# Patient Record
Sex: Male | Born: 1990 | Race: Black or African American | Hispanic: No | Marital: Single | Smoking: Current every day smoker
Health system: Southern US, Community
[De-identification: ages and names within clinical notes are randomized; demographics above are authoritative.]

## PROBLEM LIST (undated history)

## (undated) DIAGNOSIS — F209 Schizophrenia, unspecified: Secondary | ICD-10-CM

## (undated) DIAGNOSIS — F259 Schizoaffective disorder, unspecified: Secondary | ICD-10-CM

## (undated) DIAGNOSIS — R451 Restlessness and agitation: Secondary | ICD-10-CM

---

## 2012-06-03 DIAGNOSIS — F909 Attention-deficit hyperactivity disorder, unspecified type: Secondary | ICD-10-CM | POA: Insufficient documentation

## 2013-06-07 ENCOUNTER — Emergency Department: Payer: Self-pay | Admitting: Emergency Medicine

## 2013-06-15 LAB — URINALYSIS, COMPLETE
Bacteria: NONE SEEN
Bilirubin,UR: NEGATIVE
Blood: NEGATIVE
Glucose,UR: NEGATIVE mg/dL (ref 0–75)
Nitrite: NEGATIVE
PH: 5 (ref 4.5–8.0)
Protein: NEGATIVE
RBC,UR: 7 /HPF (ref 0–5)
SQUAMOUS EPITHELIAL: NONE SEEN
Specific Gravity: 1.024 (ref 1.003–1.030)
WBC UR: 5 /HPF (ref 0–5)

## 2013-06-15 LAB — CBC
HCT: 45 % (ref 40.0–52.0)
HGB: 14.9 g/dL (ref 13.0–18.0)
MCH: 29.9 pg (ref 26.0–34.0)
MCHC: 33.2 g/dL (ref 32.0–36.0)
MCV: 90 fL (ref 80–100)
PLATELETS: 241 10*3/uL (ref 150–440)
RBC: 4.99 10*6/uL (ref 4.40–5.90)
RDW: 13.6 % (ref 11.5–14.5)
WBC: 7.1 10*3/uL (ref 3.8–10.6)

## 2013-06-15 LAB — COMPREHENSIVE METABOLIC PANEL
ALBUMIN: 4.2 g/dL (ref 3.4–5.0)
ALK PHOS: 63 U/L
ANION GAP: 5 — AB (ref 7–16)
BILIRUBIN TOTAL: 0.4 mg/dL (ref 0.2–1.0)
BUN: 12 mg/dL (ref 7–18)
CALCIUM: 9.4 mg/dL (ref 8.5–10.1)
CO2: 28 mmol/L (ref 21–32)
Chloride: 103 mmol/L (ref 98–107)
Creatinine: 0.71 mg/dL (ref 0.60–1.30)
EGFR (African American): >60
EGFR (Non-African Amer.): >60
Glucose: 78 mg/dL (ref 65–99)
OSMOLALITY: 271 (ref 275–301)
POTASSIUM: 3.6 mmol/L (ref 3.5–5.1)
SGOT(AST): 20 U/L (ref 15–37)
SGPT (ALT): 20 U/L (ref 12–78)
Sodium: 136 mmol/L (ref 136–145)
Total Protein: 7.9 g/dL (ref 6.4–8.2)

## 2013-06-15 LAB — DRUG SCREEN, URINE
AMPHETAMINES, UR SCREEN: NEGATIVE (ref ?–1000)
Barbiturates, Ur Screen: NEGATIVE (ref ?–200)
Benzodiazepine, Ur Scrn: NEGATIVE (ref ?–200)
COCAINE METABOLITE, UR ~~LOC~~: NEGATIVE (ref ?–300)
Cannabinoid 50 Ng, Ur ~~LOC~~: POSITIVE (ref ?–50)
MDMA (Ecstasy)Ur Screen: NEGATIVE (ref ?–500)
Methadone, Ur Screen: NEGATIVE (ref ?–300)
Opiate, Ur Screen: NEGATIVE (ref ?–300)
Phencyclidine (PCP) Ur S: NEGATIVE (ref ?–25)
TRICYCLIC, UR SCREEN: NEGATIVE (ref ?–1000)

## 2013-06-15 LAB — ACETAMINOPHEN LEVEL

## 2013-06-15 LAB — SALICYLATE LEVEL: SALICYLATES, SERUM: 2.1 mg/dL

## 2013-06-15 LAB — ETHANOL
Ethanol %: <0.003 % (ref 0.000–0.080)
Ethanol: <3 mg/dL

## 2013-06-16 ENCOUNTER — Inpatient Hospital Stay: Payer: Self-pay | Admitting: Psychiatry

## 2013-06-20 LAB — VALPROIC ACID LEVEL: Valproic Acid: 66 ug/mL

## 2013-07-03 ENCOUNTER — Emergency Department: Payer: Self-pay | Admitting: Emergency Medicine

## 2013-07-03 LAB — COMPREHENSIVE METABOLIC PANEL
ALBUMIN: 3.8 g/dL (ref 3.4–5.0)
Alkaline Phosphatase: 73 U/L
Anion Gap: 5 — ABNORMAL LOW (ref 7–16)
BUN: 9 mg/dL (ref 7–18)
Bilirubin,Total: 0.2 mg/dL (ref 0.2–1.0)
CHLORIDE: 107 mmol/L (ref 98–107)
CO2: 27 mmol/L (ref 21–32)
Calcium, Total: 8.8 mg/dL (ref 8.5–10.1)
Creatinine: 0.8 mg/dL (ref 0.60–1.30)
EGFR (African American): >60
GLUCOSE: 84 mg/dL (ref 65–99)
OSMOLALITY: 275 (ref 275–301)
POTASSIUM: 3.5 mmol/L (ref 3.5–5.1)
SGOT(AST): 21 U/L (ref 15–37)
SGPT (ALT): 22 U/L (ref 12–78)
SODIUM: 139 mmol/L (ref 136–145)
Total Protein: 7.3 g/dL (ref 6.4–8.2)

## 2013-07-03 LAB — URINALYSIS, COMPLETE
BILIRUBIN, UR: NEGATIVE
BLOOD: NEGATIVE
GLUCOSE, UR: NEGATIVE mg/dL (ref 0–75)
Leukocyte Esterase: NEGATIVE
NITRITE: NEGATIVE
PROTEIN: NEGATIVE
Ph: 5 (ref 4.5–8.0)
RBC,UR: <1 /HPF (ref 0–5)
Specific Gravity: 1.026 (ref 1.003–1.030)
Squamous Epithelial: <1

## 2013-07-03 LAB — ACETAMINOPHEN LEVEL

## 2013-07-03 LAB — CBC
HCT: 40.3 % (ref 40.0–52.0)
HGB: 13.4 g/dL (ref 13.0–18.0)
MCH: 30.1 pg (ref 26.0–34.0)
MCHC: 33.3 g/dL (ref 32.0–36.0)
MCV: 90 fL (ref 80–100)
Platelet: 192 10*3/uL (ref 150–440)
RBC: 4.46 10*6/uL (ref 4.40–5.90)
RDW: 14.6 % — ABNORMAL HIGH (ref 11.5–14.5)
WBC: 7.2 10*3/uL (ref 3.8–10.6)

## 2013-07-03 LAB — ETHANOL: Ethanol %: <0.003 % (ref 0.000–0.080)

## 2013-07-03 LAB — SALICYLATE LEVEL

## 2013-07-04 LAB — DRUG SCREEN, URINE

## 2013-07-04 LAB — VALPROIC ACID LEVEL: VALPROIC ACID: 33 ug/mL — AB
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
## 2014-05-05 NOTE — Consult Note (Signed)
PATIENT NAME:  [name], [name] MR#:  [id number] DATE OF BIRTH:  08/17/1990  DATE OF CONSULTATION:  07/04/2013  REFERRING PHYSICIAN:  Janalyn Harderavid Kaminski, MD  CONSULTING PHYSICIAN:  Ardeen FillersUzma [name]. Greg BuddyFaheem, MD  REASON FOR CONSULTATION: "I need my medication. I had a panic attack earlier. I'm easily startled. I have pressure from my family."   HISTORY OF PRESENT ILLNESS: The patient is a 24 year old male with history of schizoaffective disorder who was recently discharged from the inpatient unit, presented again to the ED with depressive feelings. He reported that he has been having depressive feelings and feeling hyper-religious for the past few days. His mother reported initially that the patient has expressed feelings of worthlessness and appeared to be having racing thoughts. He reported that he also has history of suicidal attempts in the past and mother was concerned for his safety. The patient reported that he has been feeling anxious and has been having auditory hallucinations, especially of the God'[name] voices. However, he reported that he has been reading extensively and has lots of books. The patient reported that after his discharge from the hospital, he was unable to keep his follow-up appointment and started having panic attacks. He reported that for the past 6 days, he was feeling depressed about things going on in his life. However, he was researching a lot and was reading a lot of books. He stated that he was unable [facility] because he lost their number. He stated that he is currently compliant with his medication and is supposed to get [place] shot on the 7th, but he does not have the number for the [facility]. He recurrently denied having any suicidal ideations or plans. He denied having any perceptual disturbances. He reports that he does not use any drugs or alcohol at this time.   PAST PSYCHIATRIC HISTORY: The patient has a history of multiple psychiatric hospitalizations. He has been  tried on multiple psychotropic medications in the past and is currently stable on his medications. He takes [place]vega Sustenna on a regular basis. He is supposed to follow up with his [facility], but reported that he lost the number for them. The patient reported multiple suicide attempts by running into the traffic, jumping off the bridge and overdoses. He started off his mental illness at the age of 89.   FAMILY PSYCHIATRIC HISTORY: Unknown as the patient was adopted.   PAST MEDICAL HISTORY: History of slipped disc in the lower back.   ALLERGIES: GEODON.   CURRENT MEDICATIONS: Zyprexa 10 mg b.i.d., temazepam 15 mg at bedtime, Depakote 500 mg 3 times daily, Invega Sustenna 234 mg every month.   SOCIAL HISTORY: The patient is currently adopted. His adoptive mother lives in [place]. His biological mother also lives there locally. He is currently living with his family. He stated that he has Disability.   REVIEW OF SYSTEMS: CONSTITUTIONAL: Denies any fever or chills. No weight changes.  EYES: No double or blurred vision.  ENT: No hearing loss.  RESPIRATORY: No shortness of breath or cough.  CARDIOVASCULAR: Denies any chest pain or orthopnea.  GASTROINTESTINAL: No abdominal pain, nausea, vomiting or diarrhea.  GENITOURINARY: No incontinence or frequency.  ENDOCRINE: No heat or cold intolerance.  LYMPHATIC: No anemia or easy bruising.  INTEGUMENTARY: No acne or rash.  MUSCULOSKELETAL: No muscle or joint pain.  NEUROLOGIC: No tingling or weakness.   VITAL SIGNS: Temperature 98, pulse 92, respirations 18, blood pressure 91/42.  LABORATORY DATA: Glucose 84, BUN 9, creatinine 0.8,  sodium 139, potassium 3.5, chloride 107, bicarbonate 27, anion gap 5, osmolality 275, calcium 8.8. Blood alcohol level less than 3. Protein 7.3, albumin 3.8, bilirubin 0.2, alkaline phosphatase 73, AST 21, ALT 22. UDS is negative. WBC 7.2, RBC 4.46, hemoglobin 13.4, hematocrit 40.3, MCV is 90, RDW is 14.6.   MENTAL STATUS  EXAMINATION: The patient is a moderately built male who appeared his stated age. His muscle tone appears normal. Gait and station appear within normal limits. Speech was low in tone and volume. Thought process logical, goal directed. Thought content was not delusional. No looseness of associations are noted. Insight and judgment regarding his psychiatric illness are fair. Awake, alert and oriented x 3. Recent and remove memory were intact. Attention span and concentration were normal. His fund of knowledge and language appears normal. His insight and judgment are within normal limits. Mood was fine. Affect was congruent.   DIAGNOSTIC IMPRESSION:  AXIS I:  1. Schizoaffective disorder bipolar type.  2. Cannabis abuse.  AXIS II: None.  AXIS III: History of slipped disc.   TREATMENT PLAN:  1. The patient contracted for safety and reported that he does not have any thoughts to hurt himself.  2. He will be discharged from the ER at this time and he will follow up with [facility]. He has enough supply of his medications and no medications will be given to him at this time. The patient will be given information about the [facility]. I advised the patient to come back if he notices worsening of his symptoms and he demonstrated understanding.   Thank you for allowing me to participate in the care of this patient.     ____________________________ Ardeen Fillers. Greg Buddy, MD usf:es D: 07/04/2013 14:01:36 ET T: 07/04/2013 15:47:01 ET JOB#: [id number]  cc: Ardeen Fillers. Greg Buddy, MD, <Dictator> Rhunette Croft MD ELECTRONICALLY SIGNED 07/06/2013 11:08

## 2014-05-05 NOTE — Consult Note (Signed)
Brief Consult Note: Diagnosis: Schizoaffective disorder bipolar type.   Patient was seen by consultant.   Consult note dictated.   Recommend further assessment or treatment.   Orders entered.   Comments: Will admitt to psychiatry.  Electronic Signatures: Kristine LineaPucilowska, Latiqua Daloia (MD)  (Signed 05-Jun-15 12:44)  Authored: Brief Consult Note   Last Updated: 05-Jun-15 12:44 by Kristine LineaPucilowska, Maddux Vanscyoc (MD)

## 2014-05-11 ENCOUNTER — Emergency Department
Admit: 2014-05-11 | Disposition: A | Discharge status: Other Acute Inpatient Hospital | Payer: Self-pay | Admitting: Emergency Medicine

## 2014-05-11 LAB — DRUG SCREEN, URINE
Amphetamines, Ur Screen: NEGATIVE
BARBITURATES, UR SCREEN: NEGATIVE
Benzodiazepine, Ur Scrn: POSITIVE
CANNABINOID 50 NG, UR ~~LOC~~: NEGATIVE
COCAINE METABOLITE, UR ~~LOC~~: NEGATIVE
MDMA (Ecstasy)Ur Screen: NEGATIVE
METHADONE, UR SCREEN: NEGATIVE
Opiate, Ur Screen: POSITIVE
PHENCYCLIDINE (PCP) UR S: NEGATIVE
TRICYCLIC, UR SCREEN: NEGATIVE

## 2014-05-11 LAB — COMPREHENSIVE METABOLIC PANEL
ALK PHOS: 62 U/L
AST: 22 U/L
Albumin: 4.3 g/dL
Anion Gap: 5 — ABNORMAL LOW (ref 7–16)
BUN: 11 mg/dL
Bilirubin,Total: 0.5 mg/dL
CALCIUM: 9.2 mg/dL
Chloride: 105 mmol/L
Co2: 31 mmol/L
Creatinine: 0.87 mg/dL
EGFR (African American): >60
EGFR (Non-African Amer.): >60
Glucose: 77 mg/dL
Potassium: 3.9 mmol/L
SGPT (ALT): 19 U/L
SODIUM: 141 mmol/L
Total Protein: 7.4 g/dL

## 2014-05-11 LAB — CBC
HCT: 41.4 % (ref 40.0–52.0)
HGB: 13.9 g/dL (ref 13.0–18.0)
MCH: 29.7 pg (ref 26.0–34.0)
MCHC: 33.6 g/dL (ref 32.0–36.0)
MCV: 89 fL (ref 80–100)
PLATELETS: 208 10*3/uL (ref 150–440)
RBC: 4.68 10*6/uL (ref 4.40–5.90)
RDW: 13.6 % (ref 11.5–14.5)
WBC: 7.8 10*3/uL (ref 3.8–10.6)

## 2014-05-11 LAB — URINALYSIS, COMPLETE
BILIRUBIN, UR: NEGATIVE
BLOOD: NEGATIVE
GLUCOSE, UR: NEGATIVE mg/dL (ref 0–75)
Ketone: NEGATIVE
Leukocyte Esterase: NEGATIVE
NITRITE: NEGATIVE
Ph: 5 (ref 4.5–8.0)
Protein: NEGATIVE
Specific Gravity: 1.026 (ref 1.003–1.030)

## 2014-05-11 LAB — ETHANOL

## 2014-05-11 LAB — SALICYLATE LEVEL: Salicylates, Serum: <4 mg/dL

## 2014-05-11 LAB — ACETAMINOPHEN LEVEL: Acetaminophen: <10 ug/mL

## 2015-02-15 DIAGNOSIS — F1220 Cannabis dependence, uncomplicated: Secondary | ICD-10-CM | POA: Insufficient documentation

## 2016-08-09 DIAGNOSIS — F1994 Other psychoactive substance use, unspecified with psychoactive substance-induced mood disorder: Secondary | ICD-10-CM | POA: Insufficient documentation

## 2016-09-14 DIAGNOSIS — F602 Antisocial personality disorder: Secondary | ICD-10-CM | POA: Insufficient documentation

## 2017-01-26 DIAGNOSIS — Z91199 Patient's noncompliance with other medical treatment and regimen due to unspecified reason: Secondary | ICD-10-CM | POA: Insufficient documentation

## 2017-11-09 ENCOUNTER — Emergency Department
Admission: EM | Admit: 2017-11-09 | Discharge: 2017-11-10 | Disposition: A | Discharge status: Home / Self Care | Payer: Medicare Other | Attending: Emergency Medicine | Admitting: Emergency Medicine

## 2017-11-09 ENCOUNTER — Encounter: Payer: Self-pay | Admitting: *Deleted

## 2017-11-09 ENCOUNTER — Other Ambulatory Visit: Payer: Self-pay

## 2017-11-09 DIAGNOSIS — F17200 Nicotine dependence, unspecified, uncomplicated: Secondary | ICD-10-CM | POA: Diagnosis not present

## 2017-11-09 DIAGNOSIS — F259 Schizoaffective disorder, unspecified: Secondary | ICD-10-CM | POA: Diagnosis not present

## 2017-11-09 DIAGNOSIS — F250 Schizoaffective disorder, bipolar type: Secondary | ICD-10-CM | POA: Diagnosis not present

## 2017-11-09 DIAGNOSIS — F1290 Cannabis use, unspecified, uncomplicated: Secondary | ICD-10-CM

## 2017-11-09 DIAGNOSIS — F1210 Cannabis abuse, uncomplicated: Secondary | ICD-10-CM

## 2017-11-09 DIAGNOSIS — R4689 Other symptoms and signs involving appearance and behavior: Secondary | ICD-10-CM | POA: Diagnosis present

## 2017-11-09 LAB — COMPREHENSIVE METABOLIC PANEL
ALK PHOS: 61 U/L (ref 38–126)
ALT: 18 U/L (ref 0–44)
ANION GAP: 11 (ref 5–15)
AST: 17 U/L (ref 15–41)
Albumin: 4.8 g/dL (ref 3.5–5.0)
BUN: 13 mg/dL (ref 6–20)
CHLORIDE: 102 mmol/L (ref 98–111)
CO2: 23 mmol/L (ref 22–32)
CREATININE: 0.9 mg/dL (ref 0.61–1.24)
Calcium: 9.3 mg/dL (ref 8.9–10.3)
GFR calc Af Amer: >60 mL/min (ref >60)
GFR calc non Af Amer: >60 mL/min (ref >60)
Glucose, Bld: 99 mg/dL (ref 70–99)
Potassium: 3.7 mmol/L (ref 3.5–5.1)
SODIUM: 136 mmol/L (ref 135–145)
Total Bilirubin: 0.7 mg/dL (ref 0.3–1.2)
Total Protein: 8.2 g/dL — ABNORMAL HIGH (ref 6.5–8.1)

## 2017-11-09 LAB — CBC
HCT: 44.9 % (ref 39.0–52.0)
HEMOGLOBIN: 15.5 g/dL (ref 13.0–17.0)
MCH: 29.4 pg (ref 26.0–34.0)
MCHC: 34.5 g/dL (ref 30.0–36.0)
MCV: 85 fL (ref 80.0–100.0)
NRBC: 0 % (ref 0.0–0.2)
PLATELETS: 265 10*3/uL (ref 150–400)
RBC: 5.28 MIL/uL (ref 4.22–5.81)
RDW: 12.3 % (ref 11.5–15.5)
WBC: 7.6 10*3/uL (ref 4.0–10.5)

## 2017-11-09 LAB — ACETAMINOPHEN LEVEL

## 2017-11-09 LAB — ETHANOL: Alcohol, Ethyl (B): <10 mg/dL (ref <10)

## 2017-11-09 LAB — SALICYLATE LEVEL: Salicylate Lvl: <7 mg/dL (ref 2.8–30.0)

## 2017-11-09 MED ORDER — ZIPRASIDONE HCL 20 MG PO CAPS
40.0000 mg | ORAL_CAPSULE | Freq: Once | ORAL | Status: AC
  Administered 2017-11-10: 40 mg via ORAL

## 2017-11-09 NOTE — ED Triage Notes (Signed)
Pt brought in by bpd.  Pt is IVC.  Pt talkative.
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
## 2017-11-10 DIAGNOSIS — F259 Schizoaffective disorder, unspecified: Secondary | ICD-10-CM | POA: Diagnosis not present

## 2017-11-10 DIAGNOSIS — F250 Schizoaffective disorder, bipolar type: Secondary | ICD-10-CM

## 2017-11-10 DIAGNOSIS — F1290 Cannabis use, unspecified, uncomplicated: Secondary | ICD-10-CM

## 2017-11-10 DIAGNOSIS — F1210 Cannabis abuse, uncomplicated: Secondary | ICD-10-CM

## 2017-11-10 LAB — URINE DRUG SCREEN, QUALITATIVE (ARMC ONLY)
AMPHETAMINES, UR SCREEN: NOT DETECTED
Barbiturates, Ur Screen: NOT DETECTED
Benzodiazepine, Ur Scrn: NOT DETECTED
CANNABINOID 50 NG, UR ~~LOC~~: POSITIVE — AB
COCAINE METABOLITE, UR ~~LOC~~: NOT DETECTED
MDMA (ECSTASY) UR SCREEN: NOT DETECTED
Methadone Scn, Ur: NOT DETECTED
Opiate, Ur Screen: NOT DETECTED
Phencyclidine (PCP) Ur S: NOT DETECTED
TRICYCLIC, UR SCREEN: NOT DETECTED

## 2017-11-10 MED ORDER — ZIPRASIDONE HCL 20 MG PO CAPS
ORAL_CAPSULE | ORAL | Status: AC
  Administered 2017-11-10: 40 mg via ORAL
  Filled 2017-11-10: qty 2

## 2017-11-10 MED ORDER — ARIPIPRAZOLE ER 400 MG IM SRER
400.0000 mg | Freq: Once | INTRAMUSCULAR | Status: AC
  Administered 2017-11-10: 400 mg via INTRAMUSCULAR
  Filled 2017-11-10 (×2): qty 2

## 2017-11-10 NOTE — BH Assessment (Signed)
Writer called phone number in patient's chart [phone]), after Clinical research associate announced he was calling from [facility] ER, the phone call was disconnected. Writer called back several times but went to voicemail. Writer lift a HIPPA complaint message, requesting a return phone call.

## 2017-11-10 NOTE — ED Notes (Signed)
Patient resting quietly in room. No noted distress or abnormal behaviors noted. Will continue 15 minute checks and observation by security camera for safety. 

## 2017-11-10 NOTE — Discharge Instructions (Signed)
Return to the emergency department as needed for any depression or altered mental status or thoughts of wanting to hurt yourself or others.

## 2017-11-10 NOTE — ED Notes (Signed)
Pt given ordered injection and is ready for discharge.   Maintained on 15 minute checks and observation by security camera for safety.

## 2017-11-10 NOTE — ED Provider Notes (Signed)
Vitals:   11/09/17 2249  BP: (!) 109/92  Pulse: 88  Resp: 20  Temp: 98.6 F (37 C)  SpO2: 95%   No acute events reported to me overnight by physician or nursing report.  Patient remains on involuntary commitment for disorganized behavior, psychosis.  I reviewed the laboratory studies finding UDS positive for cannabinoids.  Patient is awaiting TTS and psychiatry evaluation.   Governor Rooks, MD 11/10/17 [phone]

## 2017-11-10 NOTE — Consult Note (Signed)
[facility] Psychiatry Consult   Reason for Consult: Consult for this 27 year old man with schizoaffective disorder brought in under IVC Referring Physician: Reita Cliche Patient Identification: [name] MRN:  [id number] Principal Diagnosis: Schizoaffective disorder, bipolar type [facility]) Diagnosis:   Patient Active Problem List   Diagnosis Date Noted  . Schizoaffective disorder, bipolar type ([place]) [F25.0] 11/10/2017  . Cannabis abuse [F12.10] 11/10/2017    Total Time spent with patient: 1 hour  Subjective:   [name] is a 27 y.o. male patient admitted with "it was my sister and mom".  HPI: 14 year old man with an established history of chronic psychotic disorder.  He was brought in after police were called to his sister's house.  His sister and mother were complaining that the patient was agitated and confused and psychotic.  When police confronted him he was outdoors and was reportedly speaking in tongues.  There was an allegation that he had been aggressive towards his sister.  Patient denies that he was aggressive or violent towards his sister or anyone else.  He admits that he was speaking in tongues which she says was probably a bad strategy since he should have known the police did not understand that language.  Patient is somewhat giggly about the whole thing.  He freely tells me that he has chronic mental illness and has schizophrenia and that he has not been on his medicine in a few weeks.  He says that the apartment he was staying in in [place] was condemned and [employer] did not help to find him a new apartment so he decided to take the bus up to [place] to visit his mother and sister.  He is only been here for a few days.  Patient admits to having hallucinations but says he finds them pleasant and not disturbing.  Denies any suicidal or homicidal thought.  Says that his sleep pattern is intermittent.  He is actually able to carry on a reasonably lucid  conversation interrupted only by some overly religious comments.  Social history: Apparently does not have a guardian.  Does appear to have disability.  Was living in [place] and says he had an act team at one point but not recently.  His adopted family lives here in the [place] area.  Substance abuse history: Ongoing abuse of cannabis which she says helped him with his "nerves".  Denies that he is been drinking or using any other drugs.  Medical history: Past history of back pain but he is not complaining of anything now no other known medical problems  Past Psychiatric History: Multiple hospitalizations.  Evidently has had psychiatric problems since he was a young child.  Patient in the past had claimed to Dr. Mamie Nick that he had had multiple "suicide attempts".  To me he denies ever having tried to kill himself.  He tells me that he was getting Abilify long-acting shots in the [place] and also taking Depakote but he thought only the Abilify was particularly helpful.  Risk to Self: Suicidal Ideation: No Suicidal Intent: No Is patient at risk for suicide?: No Suicidal Plan?: No Access to Means: No What has been your use of drugs/alcohol within the last 12 months?: Cannabis & Alcohol How many times?: 0 Other Self Harm Risks: Reports of none Triggers for Past Attempts: None known Intentional Self Injurious Behavior: None Risk to Others: Homicidal Ideation: No Thoughts of Harm to Others: No-Not Currently Present/Within Last 6 Months Current Homicidal Intent: No Current Homicidal Plan: No Access to  Homicidal Means: No Identified Victim: Reports of none History of harm to others?: Yes Assessment of Violence: On admission Violent Behavior Description: Prior to arriving to the ER Does patient have access to weapons?: No Does patient have a court date: No Prior Inpatient Therapy: Prior Inpatient Therapy: Yes Prior Therapy Dates: Multiple Hospitalizations Prior Therapy Facilty/Provider(s):  "I was in the one in [place]" Reason for Treatment: Schizophrenia Prior Outpatient Therapy: Prior Outpatient Therapy: Yes Prior Therapy Dates: Current Prior Therapy Facilty/Provider(s): Receives ACTT services but provider is unknown Reason for Treatment: ACT Team Does patient have an ACCT team?: Yes Does patient have Intensive In-House Services?  : No Does patient have [facility] services? : No Does patient have P4CC services?: No  Past Medical History: No past medical history on file.  Family History: No family history on file. Family Psychiatric  History: Adopted with little knowledge about family of origin Social History:  Social History   Substance and Sexual Activity  Alcohol Use Yes     Social History   Substance and Sexual Activity  Drug Use Yes    Social History   Socioeconomic History  . Marital status: Single    Spouse name: Not on file  . Number of children: Not on file  . Years of education: Not on file  . Highest education level: Not on file  Occupational History  . Not on file  Social Needs  . Financial resource strain: Not on file  . Food insecurity:    Worry: Not on file    Inability: Not on file  . Transportation needs:    Medical: Not on file    Non-medical: Not on file  Tobacco Use  . Smoking status: Current Every Day Smoker  . Smokeless tobacco: Never Used  Substance and Sexual Activity  . Alcohol use: Yes  . Drug use: Yes  . Sexual activity: Not on file  Lifestyle  . Physical activity:    Days per week: Not on file    Minutes per session: Not on file  . Stress: Not on file  Relationships  . Social connections:    Talks on phone: Not on file    Gets together: Not on file    Attends religious service: Not on file    Active member of club or organization: Not on file    Attends meetings of clubs or organizations: Not on file    Relationship status: Not on file  Other Topics Concern  . Not on file  Social History Narrative  . Not on  file   Additional Social History:    Allergies:   Allergies  Allergen Reactions  . Ibuprofen Swelling    Tongue swelling  . Risperidone And Related Other (See Comments) and Swelling    gynecomastia gynecomastia   . Ziprasidone Swelling    Tongue swells  . Quetiapine Other (See Comments)    Depression, suicidality, adverse effect: seizures     Labs:  Results for orders placed or performed during the hospital encounter of 11/09/17 (from the past 48 hour(s))  Comprehensive metabolic panel     Status: Abnormal   Collection Time: 11/09/17 10:53 PM  Result Value Ref Range   Sodium 136 135 - 145 mmol/L   Potassium 3.7 3.5 - 5.1 mmol/L   Chloride 102 98 - 111 mmol/L   CO2 23 22 - 32 mmol/L   Glucose, Bld 99 70 - 99 mg/dL   BUN 13 6 - 20 mg/dL   Creatinine, Ser 0.90  0.61 - 1.24 mg/dL   Calcium 9.3 8.9 - 10.3 mg/dL   Total Protein 8.2 (H) 6.5 - 8.1 g/dL   Albumin 4.8 3.5 - 5.0 g/dL   AST 17 15 - 41 U/L   ALT 18 0 - 44 U/L   Alkaline Phosphatase 61 38 - 126 U/L   Total Bilirubin 0.7 0.3 - 1.2 mg/dL   GFR calc non Af Amer >60 >60 mL/min   GFR calc Af Amer >60 >60 mL/min    Comment: (NOTE) The eGFR has been calculated using the CKD EPI equation. This calculation has not been validated in all clinical situations. eGFR's persistently <60 mL/min signify possible Chronic Kidney Disease.    Anion gap 11 5 - 15    Comment: Performed at [facility], [address] [place], [place] [address]  Ethanol     Status: None   Collection Time: 11/09/17 10:53 PM  Result Value Ref Range   Alcohol, Ethyl (B) <10 <10 mg/dL    Comment: (NOTE) Lowest detectable limit for serum alcohol is 10 mg/dL. For medical purposes only. Performed at [facility], [address] [place], [place] [address]   Salicylate level     Status: None   Collection Time: 11/09/17 10:53 PM  Result Value Ref Range   Salicylate Lvl <5.9 2.8 - 30.0 mg/dL    Comment: Performed at [facility], [address] [place], [place] [address]  Acetaminophen level     Status: Abnormal   Collection Time: 11/09/17 10:53 PM  Result Value Ref Range   Acetaminophen (Tylenol), Serum <10 (L) 10 - 30 ug/mL    Comment: (NOTE) Therapeutic concentrations vary significantly. A range of 10-30 ug/mL  may be an effective concentration for many patients. However, some  are best treated at concentrations outside of this range. Acetaminophen concentrations >150 ug/mL at 4 hours after ingestion  and >50 ug/mL at 12 hours after ingestion are often associated with  toxic reactions. Performed at [facility], [address] [place], [place] [address]   cbc     Status: None   Collection Time: 11/09/17 10:53 PM  Result Value Ref Range   WBC 7.6 4.0 - 10.5 K/uL   RBC 5.28 4.22 - 5.81 MIL/uL   Hemoglobin 15.5 13.0 - 17.0 g/dL   HCT 44.9 39.0 - 52.0 %   MCV 85.0 80.0 - 100.0 fL   MCH 29.4 26.0 - 34.0 pg   MCHC 34.5 30.0 - 36.0 g/dL   RDW 12.3 11.5 - 15.5 %   Platelets 265 150 - 400 K/uL   nRBC 0.0 0.0 - 0.2 %    Comment: Performed at [facility], [address] [place], [place] [address]  Urine Drug Screen, Qualitative     Status: Abnormal   Collection Time: 11/09/17 11:28 PM  Result Value Ref Range   Tricyclic, Ur Screen NONE DETECTED NONE DETECTED   Amphetamines, Ur Screen NONE DETECTED NONE DETECTED   MDMA (Ecstasy)Ur Screen NONE DETECTED NONE DETECTED   Cocaine Metabolite,Ur [place] NONE DETECTED NONE DETECTED   Opiate, Ur Screen NONE DETECTED NONE DETECTED   Phencyclidine (PCP) Ur S NONE DETECTED NONE DETECTED   Cannabinoid 50 Ng, Ur [place] POSITIVE (A) NONE DETECTED   Barbiturates, Ur Screen NONE DETECTED NONE DETECTED   Benzodiazepine, Ur Scrn NONE DETECTED NONE DETECTED   Methadone Scn, Ur NONE DETECTED NONE DETECTED    Comment: (NOTE) Tricyclics + metabolites, urine    Cutoff 1000 ng/mL  Amphetamines + metabolites, urine  Cutoff 1000 ng/mL MDMA (Ecstasy),  urine              Cutoff 500 ng/mL Cocaine Metabolite, urine          Cutoff 300 ng/mL Opiate + metabolites, urine        Cutoff 300 ng/mL Phencyclidine (PCP), urine         Cutoff 25 ng/mL Cannabinoid, urine                 Cutoff 50 ng/mL Barbiturates + metabolites, urine  Cutoff 200 ng/mL Benzodiazepine, urine              Cutoff 200 ng/mL Methadone, urine                   Cutoff 300 ng/mL The urine drug screen provides only a preliminary, unconfirmed analytical test result and should not be used for non-medical purposes. Clinical consideration and professional judgment should be applied to any positive drug screen result due to possible interfering substances. A more specific alternate chemical method must be used in order to obtain a confirmed analytical result. Gas chromatography / mass spectrometry (GC/MS) is the preferred confirmat ory method. Performed at [facility], [address] [place], [place] [address]     No current facility-administered medications for this encounter.    No current outpatient medications on file.    Musculoskeletal: Strength & Muscle Tone: within normal limits Gait & Station: normal Patient leans: N/A  Psychiatric Specialty Exam: Physical Exam  Nursing note and vitals reviewed. Constitutional: He appears well-developed and well-nourished.  HENT:  Head: Normocephalic and atraumatic.  Eyes: Pupils are equal, round, and reactive to light. Conjunctivae are normal.  Neck: Normal range of motion.  Cardiovascular: Regular rhythm and normal heart sounds.  Respiratory: Effort normal. No respiratory distress.  GI: Soft.  Musculoskeletal: Normal range of motion.  Neurological: He is alert.  Skin: Skin is warm and dry.  Psychiatric: He has a normal mood and affect. His speech is normal. He is not agitated. Thought content is delusional. Thought content is not paranoid. Cognition and memory are impaired. He expresses impulsivity. He  expresses no homicidal and no suicidal ideation.    Review of Systems  Constitutional: Negative.   HENT: Negative.   Eyes: Negative.   Respiratory: Negative.   Cardiovascular: Negative.   Gastrointestinal: Negative.   Musculoskeletal: Negative.   Skin: Negative.   Neurological: Negative.   Psychiatric/Behavioral: Positive for hallucinations and substance abuse. Negative for depression, memory loss and suicidal ideas. The patient is nervous/anxious and has insomnia.     Blood pressure (!) 109/92, pulse 88, temperature 98.6 F (37 C), temperature source Oral, resp. rate 20, height _0  (1.778 m), weight 65.8 kg, SpO2 95 %.Body mass index is 20.81 kg/m.  General Appearance: Casual  Eye Contact:  Good  Speech:  Clear and Coherent  Volume:  Normal  Mood:  Euphoric  Affect:  Labile  Thought Process:  Coherent  Orientation:  Full (Time, Place, and Person)  Thought Content:  Hallucinations: Auditory and Rumination  Suicidal Thoughts:  No  Homicidal Thoughts:  No  Memory:  Immediate;   Fair Recent;   Fair Remote;   Fair  Judgement:  Fair  Insight:  Fair  Psychomotor Activity:  Normal  Concentration:  Concentration: Fair  Recall:  [employer] of Knowledge:  Fair  Language:  Fair  Akathisia:  No  Handed:  Right  AIMS (if  indicated):     Assets:  Desire for Improvement Physical Health  ADL's:  Impaired  Cognition:  Impaired,  Mild  Sleep:        Treatment Plan Summary: Medication management and Plan Patient with chronic mental health problems brought in after a squabble with his family.  Does not appear that anyone thought that it rose to the level of requiring being arrested.  Patient has been calm and cooperative here.  Denies any suicidal or homicidal thoughts.  He states that his plan is to travel back to [place] as soon as possible.  He says that he has access to his own money with his own debit card.  I proposed to him that he get his Abilify maintain a injection before  leaving and he was immediately agreeable to this.  Patient will be given 400 mg Abilify maintain a but at that point I think he can be released because I do not think he meets commitment criteria anymore.  Patient was strongly encouraged to get back down to [facility] and follow up with his usual outpatient providers and agrees to this.  Strongly encouraged also to try and lay off the marijuana.  Disposition: No evidence of imminent risk to self or others at present.   Patient does not meet criteria for psychiatric inpatient admission. Supportive therapy provided about ongoing stressors. Discussed crisis plan, support from social network, calling 911, coming to the Emergency Department, and calling Suicide Hotline.  Alethia Berthold, MD 11/10/2017 3:10 PM

## 2017-11-10 NOTE — ED Notes (Signed)
Pt came to nurses station window and was hostile to Engineer, materials.  Pt threw graham crackers on floor, refusing to pick them up.  Waiting on injection so pt can be discharged.   Maintained on 15 minute checks and observation by security camera for safety.

## 2017-11-10 NOTE — ED Notes (Signed)
Pt discharged home with friend. Abilify injection given. VS stable. Pt denies SI/HI. Discharge paperwork reviewed with and signed for by patient. All belongings returned to patient.
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
## 2018-01-02 DIAGNOSIS — F4325 Adjustment disorder with mixed disturbance of emotions and conduct: Secondary | ICD-10-CM | POA: Insufficient documentation

## 2018-11-06 ENCOUNTER — Emergency Department
Admission: EM | Admit: 2018-11-06 | Discharge: 2018-11-07 | Disposition: A | Discharge status: Home / Self Care | Payer: Medicare Other | Attending: Emergency Medicine | Admitting: Emergency Medicine

## 2018-11-06 ENCOUNTER — Other Ambulatory Visit: Payer: Self-pay

## 2018-11-06 ENCOUNTER — Encounter: Payer: Self-pay | Admitting: Emergency Medicine

## 2018-11-06 DIAGNOSIS — F1414 Cocaine abuse with cocaine-induced mood disorder: Secondary | ICD-10-CM | POA: Diagnosis not present

## 2018-11-06 DIAGNOSIS — F203 Undifferentiated schizophrenia: Secondary | ICD-10-CM | POA: Insufficient documentation

## 2018-11-06 DIAGNOSIS — R45851 Suicidal ideations: Secondary | ICD-10-CM | POA: Diagnosis not present

## 2018-11-06 DIAGNOSIS — F1494 Cocaine use, unspecified with cocaine-induced mood disorder: Secondary | ICD-10-CM | POA: Diagnosis present

## 2018-11-06 DIAGNOSIS — Z20828 Contact with and (suspected) exposure to other viral communicable diseases: Secondary | ICD-10-CM | POA: Insufficient documentation

## 2018-11-06 DIAGNOSIS — F329 Major depressive disorder, single episode, unspecified: Secondary | ICD-10-CM

## 2018-11-06 DIAGNOSIS — F209 Schizophrenia, unspecified: Secondary | ICD-10-CM | POA: Insufficient documentation

## 2018-11-06 DIAGNOSIS — R451 Restlessness and agitation: Secondary | ICD-10-CM | POA: Diagnosis present

## 2018-11-06 DIAGNOSIS — F17210 Nicotine dependence, cigarettes, uncomplicated: Secondary | ICD-10-CM | POA: Diagnosis not present

## 2018-11-06 DIAGNOSIS — F32A Depression, unspecified: Secondary | ICD-10-CM

## 2018-11-06 HISTORY — DX: Schizophrenia, unspecified: F20.9

## 2018-11-06 LAB — COMPREHENSIVE METABOLIC PANEL
ALT: 22 U/L (ref 0–44)
AST: 22 U/L (ref 15–41)
Albumin: 4.3 g/dL (ref 3.5–5.0)
Alkaline Phosphatase: 60 U/L (ref 38–126)
Anion gap: 13 (ref 5–15)
BUN: 15 mg/dL (ref 6–20)
CO2: 24 mmol/L (ref 22–32)
Calcium: 9.1 mg/dL (ref 8.9–10.3)
Chloride: 104 mmol/L (ref 98–111)
Creatinine, Ser: 1.28 mg/dL — ABNORMAL HIGH (ref 0.61–1.24)
GFR calc Af Amer: >60 mL/min (ref >60)
GFR calc non Af Amer: >60 mL/min (ref >60)
Glucose, Bld: 122 mg/dL — ABNORMAL HIGH (ref 70–99)
Potassium: 3.6 mmol/L (ref 3.5–5.1)
Sodium: 141 mmol/L (ref 135–145)
Total Bilirubin: 0.5 mg/dL (ref 0.3–1.2)
Total Protein: 7.8 g/dL (ref 6.5–8.1)

## 2018-11-06 LAB — URINE DRUG SCREEN, QUALITATIVE (ARMC ONLY)
Amphetamines, Ur Screen: NOT DETECTED
Barbiturates, Ur Screen: NOT DETECTED
Benzodiazepine, Ur Scrn: POSITIVE — AB
Cannabinoid 50 Ng, Ur ~~LOC~~: POSITIVE — AB
Cocaine Metabolite,Ur ~~LOC~~: POSITIVE — AB
MDMA (Ecstasy)Ur Screen: NOT DETECTED
Methadone Scn, Ur: NOT DETECTED
Opiate, Ur Screen: NOT DETECTED
Phencyclidine (PCP) Ur S: NOT DETECTED
Tricyclic, Ur Screen: NOT DETECTED

## 2018-11-06 LAB — CBC
HCT: 42.2 % (ref 39.0–52.0)
Hemoglobin: 14.4 g/dL (ref 13.0–17.0)
MCH: 29.1 pg (ref 26.0–34.0)
MCHC: 34.1 g/dL (ref 30.0–36.0)
MCV: 85.3 fL (ref 80.0–100.0)
Platelets: 190 10*3/uL (ref 150–400)
RBC: 4.95 MIL/uL (ref 4.22–5.81)
RDW: 13 % (ref 11.5–15.5)
WBC: 8.3 10*3/uL (ref 4.0–10.5)
nRBC: 0 % (ref 0.0–0.2)

## 2018-11-06 LAB — ETHANOL: Alcohol, Ethyl (B): 39 mg/dL — ABNORMAL HIGH (ref <10)

## 2018-11-06 LAB — SALICYLATE LEVEL: Salicylate Lvl: <7 mg/dL (ref 2.8–30.0)

## 2018-11-06 LAB — SARS CORONAVIRUS 2 BY RT PCR (HOSPITAL ORDER, PERFORMED IN ~~LOC~~ HOSPITAL LAB): SARS Coronavirus 2: NEGATIVE

## 2018-11-06 LAB — ACETAMINOPHEN LEVEL: Acetaminophen (Tylenol), Serum: <10 ug/mL — ABNORMAL LOW (ref 10–30)

## 2018-11-06 MED ORDER — QUETIAPINE FUMARATE 200 MG PO TABS: 200.0000 mg | ORAL_TABLET | Freq: Every day | ORAL | Status: DC

## 2018-11-06 MED ORDER — HALOPERIDOL LACTATE 5 MG/ML IJ SOLN
10.0000 mg | Freq: Once | INTRAMUSCULAR | Status: AC
  Administered 2018-11-06: 01:00:00 10 mg via INTRAMUSCULAR

## 2018-11-06 MED ORDER — DIPHENHYDRAMINE HCL 50 MG/ML IJ SOLN
50.0000 mg | Freq: Once | INTRAMUSCULAR | Status: AC
  Administered 2018-11-06: 01:00:00 50 mg via INTRAMUSCULAR

## 2018-11-06 MED ORDER — BENZTROPINE MESYLATE 1 MG PO TABS
1.0000 mg | ORAL_TABLET | Freq: Every day | ORAL | Status: DC
  Administered 2018-11-06 – 2018-11-07 (×2): 1 mg via ORAL
  Filled 2018-11-06 (×2): qty 1

## 2018-11-06 MED ORDER — TRAZODONE HCL 100 MG PO TABS
100.0000 mg | ORAL_TABLET | Freq: Every day | ORAL | Status: DC
  Administered 2018-11-06: 22:00:00 100 mg via ORAL
  Filled 2018-11-06: qty 1

## 2018-11-06 MED ORDER — LORAZEPAM 2 MG/ML IJ SOLN
2.0000 mg | Freq: Once | INTRAMUSCULAR | Status: AC
  Administered 2018-11-06: 01:00:00 2 mg via INTRAMUSCULAR

## 2018-11-06 MED ORDER — HALOPERIDOL 5 MG PO TABS
5.0000 mg | ORAL_TABLET | Freq: Two times a day (BID) | ORAL | Status: DC
  Administered 2018-11-06 – 2018-11-07 (×3): 5 mg via ORAL
  Filled 2018-11-06 (×3): qty 1

## 2018-11-06 MED ORDER — DIPHENHYDRAMINE HCL 50 MG/ML IJ SOLN
INTRAMUSCULAR | Status: AC
  Filled 2018-11-06: qty 1

## 2018-11-06 MED ORDER — DIVALPROEX SODIUM 500 MG PO DR TAB
500.0000 mg | DELAYED_RELEASE_TABLET | Freq: Two times a day (BID) | ORAL | Status: DC
  Administered 2018-11-06 – 2018-11-07 (×3): 500 mg via ORAL
  Filled 2018-11-06 (×3): qty 1

## 2018-11-06 NOTE — ED Notes (Signed)

## 2018-11-06 NOTE — ED Notes (Signed)
Per MD Paduchowski, urine not necessary at this time.

## 2018-11-06 NOTE — ED Notes (Addendum)
Report received from [place], [place]. Pt is currently asleep in the room. Pt is connected to the cardiac monitor. Pt in NAD at this time. No needs expressed at this time. V/S WNL. Officer at bedside. ED stretcher locked and in lowest position. Will continue to monitor.

## 2018-11-06 NOTE — Consult Note (Signed)
[facility] Face-to-Face Psychiatry Consult   Reason for Consult:  Cocaine abuse with suicidal ideations Referring Physician:  EDP Patient Identification: [name] MRN:  [id number] Principal Diagnosis: Cocaine abuse with cocaine induced mood disorder Diagnosis:  Active Problems:   Cocaine abuse with cocaine-induced mood disorder (HCC)  Total Time spent with patient: 1 hour  Subjective:   [name] is a 28 y.o. male patient admitted with cocaine abuse and agitation, threatening to hurt himself.  Agitation medications given.    Patient seen and evaluated in person by this provider.  Patient had been sleeping since his medications were given last night.  On assessment, calm and cooperative with no hallucinations or homicidal ideations or withdrawal symptoms.  He calmly reports having suicidal ideations, no plan or intent, and his biggest concern is "living on the streets".  Multiple hospitalization presentations in [place], [place], [place], and [place].  Many past diagnoses of malingering.  Recently admitted to [facility], recently released.  Admits he did not go to follow up and unhappy when he was told his medications would be restarted and he could continue his care with outpatient.  Appears to be feigning symptoms for secondary gain.  Multiple court dates for   HPI per MD at 1 am today:  28 y.o. male with a past medical history of schizophrenia by police/EMS with extreme agitation found walking in the street.  States he just wants to die states he is going to kill himself.  Patient is extremely agitated acting aggressively and threatening towards staff members.  Attempted to hit his head on the computer screen violently.  Patient is yelling loudly threatening to hurt staff members.  Patient states "I am going to kill myself."  Patient is unwilling to follow commands or answer questions at this time.  Unable to obtain adequate physical exam at this time.  Past Psychiatric History:  substance abuse, schizoaffective d/o  Risk to Self:  none Risk to Others:  none Prior Inpatient Therapy:  Multiple Prior Outpatient Therapy:   no current outpatient provider  Past Medical History:  Past Medical History:  Diagnosis Date  . Schizophrenia (HCC)    History reviewed. No pertinent surgical history. Family History: No family history on file. Family Psychiatric  History: none Social History:  Social History   Substance and Sexual Activity  Alcohol Use Yes     Social History   Substance and Sexual Activity  Drug Use Yes  . Types: Cocaine   Comment: today    Social History   Socioeconomic History  . Marital status: Single    Spouse name: Not on file  . Number of children: Not on file  . Years of education: Not on file  . Highest education level: Not on file  Occupational History  . Not on file  Social Needs  . Financial resource strain: Not on file  . Food insecurity    Worry: Not on file    Inability: Not on file  . Transportation needs    Medical: Not on file    Non-medical: Not on file  Tobacco Use  . Smoking status: Current Every Day Smoker  . Smokeless tobacco: Never Used  Substance and Sexual Activity  . Alcohol use: Yes  . Drug use: Yes    Types: Cocaine    Comment: today  . Sexual activity: Not on file  Lifestyle  . Physical activity    Days per week: Not on file    Minutes per session: Not  on file  . Stress: Not on file  Relationships  . Social Musician on phone: Not on file    Gets together: Not on file    Attends religious service: Not on file    Active member of club or organization: Not on file    Attends meetings of clubs or organizations: Not on file    Relationship status: Not on file  Other Topics Concern  . Not on file  Social History Narrative  . Not on file   Additional Social History:    Allergies:   Allergies  Allergen Reactions  . Ibuprofen Swelling    Tongue swelling  . Risperidone And Related  Other (See Comments) and Swelling    gynecomastia gynecomastia   . Ziprasidone Swelling    Tongue swells  . Quetiapine Other (See Comments)    Depression, suicidality, adverse effect: seizures     Labs:  Results for orders placed or performed during the hospital encounter of 11/06/18 (from the past 48 hour(s))  CBC     Status: None   Collection Time: 11/06/18  1:16 AM  Result Value Ref Range   WBC 8.3 4.0 - 10.5 K/uL   RBC 4.95 4.22 - 5.81 MIL/uL   Hemoglobin 14.4 13.0 - 17.0 g/dL   HCT 78.2 95.6 - 21.3 %   MCV 85.3 80.0 - 100.0 fL   MCH 29.1 26.0 - 34.0 pg   MCHC 34.1 30.0 - 36.0 g/dL   RDW 08.6 57.8 - 46.9 %   Platelets 190 150 - 400 K/uL   nRBC 0.0 0.0 - 0.2 %    Comment: Performed at [facility], [address] [place], [place] [address]  Comprehensive metabolic panel     Status: Abnormal   Collection Time: 11/06/18  1:16 AM  Result Value Ref Range   Sodium 141 135 - 145 mmol/L   Potassium 3.6 3.5 - 5.1 mmol/L   Chloride 104 98 - 111 mmol/L   CO2 24 22 - 32 mmol/L   Glucose, Bld 122 (H) 70 - 99 mg/dL   BUN 15 6 - 20 mg/dL   Creatinine, Ser 8.41 (H) 0.61 - 1.24 mg/dL   Calcium 9.1 8.9 - 32.4 mg/dL   Total Protein 7.8 6.5 - 8.1 g/dL   Albumin 4.3 3.5 - 5.0 g/dL   AST 22 15 - 41 U/L   ALT 22 0 - 44 U/L   Alkaline Phosphatase 60 38 - 126 U/L   Total Bilirubin 0.5 0.3 - 1.2 mg/dL   GFR calc non Af Amer >60 >60 mL/min   GFR calc Af Amer >60 >60 mL/min   Anion gap 13 5 - 15    Comment: Performed at [facility], [address] [place], [place] [address]  Acetaminophen level     Status: Abnormal   Collection Time: 11/06/18  1:16 AM  Result Value Ref Range   Acetaminophen (Tylenol), Serum <10 (L) 10 - 30 ug/mL    Comment: (NOTE) Therapeutic concentrations vary significantly. A range of 10-30 ug/mL  may be an effective concentration for many patients. However, some  are best treated at concentrations outside of this range. Acetaminophen  concentrations >150 ug/mL at 4 hours after ingestion  and >50 ug/mL at 12 hours after ingestion are often associated with  toxic reactions. Performed at [facility], [address] [place], [place] [address]   Salicylate level     Status: None   Collection Time: 11/06/18  1:16 AM  Result Value Ref Range   Salicylate Lvl <3.1 2.8 - 30.0 mg/dL    Comment: Performed at [facility], [address] [place], [place] [address]  Ethanol     Status: Abnormal   Collection Time: 11/06/18  1:16 AM  Result Value Ref Range   Alcohol, Ethyl (B) 39 (H) <10 mg/dL    Comment: (NOTE) Lowest detectable limit for serum alcohol is 10 mg/dL. For medical purposes only. Performed at [facility], [address] [place], [place] [address]     Current Facility-Administered Medications  Medication Dose Route Frequency Provider Last Rate Last Dose  . benztropine (COGENTIN) tablet 1 mg  1 mg Oral Daily ,  Y, NP      . divalproex (DEPAKOTE) DR tablet 500 mg  500 mg Oral Q12H ,  Y, NP      . haloperidol (HALDOL) tablet 5 mg  5 mg Oral BID Patrecia Pour, NP      . traZODone (DESYREL) tablet 100 mg  100 mg Oral QHS Patrecia Pour, NP       No current outpatient medications on file.    Musculoskeletal: Strength & Muscle Tone: within normal limits Gait & Station: normal Patient leans: N/A  Psychiatric Specialty Exam: Physical Exam  Nursing note and vitals reviewed. Constitutional: He is oriented to person, place, and time. He appears well-developed and well-nourished.  HENT:  Head: Normocephalic.  Neck: Normal range of motion.  Respiratory: Effort normal.  Musculoskeletal: Normal range of motion.  Neurological: He is alert and oriented to person, place, and time.  Psychiatric: His speech is normal and behavior is normal. Judgment and thought content normal. His affect is blunt. Cognition and memory are normal. He exhibits a depressed mood.     Review of Systems  Psychiatric/Behavioral: Positive for depression and substance abuse.  All other systems reviewed and are negative.   Blood pressure 101/72, pulse 68, temperature 98.6 F (37 C), temperature source Oral, resp. rate 18, height 5\' 11"  (1.803 m), weight 72.6 kg, SpO2 100 %.Body mass index is 22.32 kg/m.  General Appearance: Casual  Eye Contact:  Good  Speech:  Normal Rate  Volume:  Normal  Mood:  Depressed and Irritable  Affect:  Blunt  Thought Process:  Coherent and Descriptions of Associations: Intact  Orientation:  Full (Time, Place, and Person)  Thought Content:  WDL and Logical  Suicidal Thoughts:  No  Homicidal Thoughts:  No  Memory:  Immediate;   Good Recent;   Good Remote;   Good  Judgement:  Fair  Insight:  Fair  Psychomotor Activity:  Decreased  Concentration:  Concentration: Fair and Attention Span: Fair  Recall:  Good  Fund of Knowledge:  Fair  Language:  Fair  Akathisia:  No  Handed:  Right  AIMS (if indicated):     Assets:  Leisure Time Physical Health Resilience  ADL's:  Intact  Cognition:  WNL  Sleep:        Treatment Plan Summary: Daily contact with patient to assess and evaluate symptoms and progress in treatment, Medication management and Plan cocaine abuse with cocaine induced mood disorder:  -Restarted Haldol 5 mg BID -Restarted Depakote 500 mg BID -Follow up with [facility] in [place]  Insomnia: -Restarted Trazodone 100 mg at bedtime  EPS prevention: -Started Cogentin 1 mg daily  Disposition: No evidence of imminent risk to self or others at present.   Supportive therapy provided about ongoing stressors.  Waylan Boga, NP  11/06/2018 1:28 PM

## 2018-11-06 NOTE — ED Triage Notes (Signed)
Pt at first to room agitated and being aggressive towards officers. Pt later apologizing for that . Pt hopped from bed and swung his head at this RN's monitor stating that he wants to die.Pt was found walking in traffic with SI statements. Pt admits cocaine use tonight to this RN. Pt had no problem with IM injections because they "feel good". Pt not hostile to nursing staff and is calm at this time in his room.

## 2018-11-06 NOTE — ED Notes (Signed)
BEHAVIORAL HEALTH ROUNDING Patient sleeping: Yes.   Patient alert and oriented: eyes closed  Appears asleep Behavior appropriate: Yes.  ; If no, describe:  Nutrition and fluids offered: Yes  Toileting and hygiene offered: sleeping Sitter present: q 15 minute observations and security camera monitoring  

## 2018-11-06 NOTE — ED Notes (Signed)
Pt belongings that were not on him at the time of arrival obtained by hospital staff were as follows...Greg KitchenMarland KitchenMarland [name]   Black shirt,  White shirt, Black & grey coat,  Items placed in a belongings bag and placed at nurses station, other items will be removed and obtained once pt is not agitated.

## 2018-11-06 NOTE — ED Notes (Signed)
Pt phone continuously ringing with a programmed number titled "Ma" pt currently sleeping. This Probation officer answers pt phone to let her know that her son was safe and was at the hospital.this writer explained to mother that d/t pt being over the age of 6 hospital staff would not be able to provide information regarding her son's care but we did want to inform her that pt was ok, mother "[name]" verbalized understanding and wants him to call her when he is awake. RN notified.

## 2018-11-06 NOTE — ED Notes (Signed)
Checking on pt periodically, pt has been asleep since 0700 this am. V/S WNL. ED stretcher locked and in lowest position. Pt still on cardiac monitor. Will continue to monitor. Psychiatry attempted to reevaluate this am but pt was still sedated.

## 2018-11-06 NOTE — ED Notes (Signed)
This RN woke pt up so that he could be reevaluated by Psychiatry. Pt's cardiac monitor cords and IV removed because Dr. Jari Pigg said he was medically cleared. Psychiatry at bedside for reevaluation. Pt is calm and cooperative at this time.

## 2018-11-06 NOTE — ED Notes (Signed)
ED BHU PLACEMENT JUSTIFICATION Is the patient under IVC or is there intent for IVC: Yes.   Is the patient medically cleared: Yes.   Is there vacancy in the ED BHU: Yes.   Is the population mix appropriate for patient: Yes.   Is the patient awaiting placement in inpatient or outpatient setting: Yes.   Has the patient had a psychiatric consult: Yes.   Survey of unit performed for contraband, proper placement and condition of furniture, tampering with fixtures in bathroom, shower, and each patient room: Yes.  ; Findings:  APPEARANCE/BEHAVIOR Calm and cooperative NEURO ASSESSMENT Orientation: oriented x3  Denies pain Hallucinations: No.None noted (Hallucinations)  denies Speech: Normal Gait: normal RESPIRATORY ASSESSMENT Even  Unlabored respirations  CARDIOVASCULAR ASSESSMENT Pulses equal   regular rate  Skin warm and dry   GASTROINTESTINAL ASSESSMENT no GI complaint EXTREMITIES Full ROM  PLAN OF CARE Provide calm/safe environment. Vital signs assessed twice daily. ED BHU Assessment once each 12-hour shift. Collaborate with TTS daily or as condition indicates. Assure the ED provider has rounded once each shift. Provide and encourage hygiene. Provide redirection as needed. Assess for escalating behavior; address immediately and inform ED provider.  Assess family dynamic and appropriateness for visitation as needed: Yes.  ; If necessary, describe findings:  Educate the patient/family about BHU procedures/visitation: Yes.  ; If necessary, describe findings:   

## 2018-11-06 NOTE — ED Provider Notes (Signed)
[facility] Emergency Department Provider Note  Time seen: 12:56 AM  I have reviewed the triage vital signs and the nursing notes.   HISTORY  Chief Complaint Extreme agitation, suicidal  HPI [name] is a 28 y.o. male with a past medical history of schizophrenia by police/EMS with extreme agitation found walking in the street.  States he just wants to die states he is going to kill himself.  Patient is extremely agitated acting aggressively and threatening towards staff members.  Attempted to hit his head on the computer screen violently.  Patient is yelling loudly threatening to hurt staff members.  Patient states "I am going to kill myself."  Patient is unwilling to follow commands or answer questions at this time.  Unable to obtain adequate physical exam at this time.  No past medical history on file.  Patient Active Problem List   Diagnosis Date Noted  . Schizoaffective disorder, bipolar type ([place]) 11/10/2017  . Cannabis abuse 11/10/2017     Prior to Admission medications   Not on File    Allergies  Allergen Reactions  . Ibuprofen Swelling    Tongue swelling  . Risperidone And Related Other (See Comments) and Swelling    gynecomastia gynecomastia   . Ziprasidone Swelling    Tongue swells  . Quetiapine Other (See Comments)    Depression, suicidality, adverse effect: seizures     No family history on file.  Social History Social History   Tobacco Use  . Smoking status: Current Every Day Smoker  . Smokeless tobacco: Never Used  Substance Use Topics  . Alcohol use: Yes  . Drug use: Yes    Review of Systems Unable to obtain an adequate/accurate review of systems secondary to extreme agitation patient cooperation  ____________________________________________   PHYSICAL EXAM: Exam limited at this time due to extreme agitation and patient cooperation  Constitutional: Patient is awake and alert extremely agitated yelling, acting  in aggressive and threatening manner as and verbally threatening staff members. Eyes: Grossly normal exam. ENT      Head: Normocephalic and appears atraumatic. Respiratory: Normal respiratory effort.  No apparent distress. Musculoskeletal: Patient is moving all extremities well.  Ambulating. Neurologic: Patient is extremely agitated however his speech and language appears normal.  Moving all extremities well.  No obvious gross deficits. Skin: Skin appears to be intact and dry appearing Psychiatric: Extremely agitated, acting in threatening mannerisms, verbally threatening staff, yelling and cursing loudly  ____________________________________________   INITIAL IMPRESSION / [place] / ED COURSE  Pertinent labs & imaging results that were available during my care of the patient were reviewed by me and considered in my medical decision making (see chart for details).   Patient presents to the emergency department via EMS and police with extreme agitation walking in traffic and threatening to kill himself.  Upon arrival patient is extremely agitated with aggressive and threatening behaviors as well as verbally threatening staff members.  Patient jumped up from bed attempted to hit his head on the computer.  States he will kill himself right now.  Given his extreme agitation we will dose IM sedation medications Haldol, Ativan, Benadryl.  We will place the patient under an IVC and have psychiatry evaluate.  We will check labs including urine drug screen and continue to closely monitor.  Blood work is largely within normal limits besides a very slight alcohol elevation.  Drug screen pending.  Psychiatric evaluation pending.  Patient continues to be resting comfortably after  IM sedation earlier this morning.  [name] was evaluated in Emergency Department on 11/06/2018 for the symptoms described in the history of present illness. He was evaluated in the context of the global COVID-19  pandemic, which necessitated consideration that the patient might be at risk for infection with the SARS-CoV-2 virus that causes COVID-19. Institutional protocols and algorithms that pertain to the evaluation of patients at risk for COVID-19 are in a state of rapid change based on information released by regulatory bodies including the CDC and federal and state organizations. These policies and algorithms were followed during the patient's care in the ED.  ____________________________________________   FINAL CLINICAL IMPRESSION(S) / ED DIAGNOSES  Extreme agitation Suicidal ideation   Minna Antis, MD 11/06/18 [phone]

## 2018-11-06 NOTE — ED Notes (Signed)
Pt transferred into ED BHU room 3  Patient assigned to appropriate care area. Patient oriented to unit/care area: Informed that, for his safety, care areas are designed for safety and monitored by security cameras at all times; Visiting hours and phone times explained to patient. Patient verbalizes understanding, and verbal contract for safety obtained.   Assessment completed  He denies pain   

## 2018-11-06 NOTE — ED Notes (Signed)
Due to unpredictable behaviors the pt received several IM medications and is currently sedated.
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
## 2018-11-06 NOTE — ED Notes (Signed)
Pt given graham crackers and soda.

## 2018-11-06 NOTE — ED Notes (Signed)
IVC pending consult when alert

## 2018-11-06 NOTE — ED Notes (Signed)
Added to pt belongings: black shorts, underwear, black socks, and tennis shoes. Also: phone and Pensions consultant. Phone turned off and placed in bag. Pt has TWO bags of belongings.

## 2018-11-07 DIAGNOSIS — F203 Undifferentiated schizophrenia: Secondary | ICD-10-CM | POA: Diagnosis not present

## 2018-11-07 NOTE — ED Notes (Signed)
Patient discharged home, patient received discharge papers. Patient received belongings and verbalized he has received all of his belongings. Patient appropriate and cooperative, Denies SI/HI AVH. Vital signs taken. NAD noted. 

## 2018-11-07 NOTE — Discharge Instructions (Addendum)
Please continue your medicines : Haldol 5 mg by mouth twice a day, Depakote 500 mg by mouth twice a day, trazodone 100 mg at bedtime to help you sleep and Cogentin 1 mg once a day.  Please return here if need be.  Please follow-up with [facility] in [place].

## 2018-11-07 NOTE — ED Notes (Signed)
BEHAVIORAL HEALTH ROUNDING Patient sleeping: No. Patient alert and oriented: yes Behavior appropriate: Yes.  ; If no, describe:  Nutrition and fluids offered: yes Toileting and hygiene offered: Yes  Sitter present: q15 minute observations and security monitoring Law enforcement present: Yes    

## 2018-11-07 NOTE — ED Provider Notes (Signed)
-----------------------------------------   4:49 AM on 11/07/2018 -----------------------------------------   Blood pressure (!) 96/57, pulse 76, temperature 98.1 F (36.7 C), temperature source Oral, resp. rate 16, height 1.803 m (5\' 11" ), weight 72.6 kg, SpO2 100 %.  The patient is calm and cooperative at this time.  There have been no acute events since the last update.  Awaiting disposition plan from Behavioral Medicine and/or Social Work team(s).   Hinda Kehr, MD 11/07/18 [phone]
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
## 2018-11-29 ENCOUNTER — Emergency Department
Admission: EM | Admit: 2018-11-29 | Discharge: 2018-11-29 | Disposition: A | Discharge status: Other Acute Inpatient Hospital | Payer: Medicare Other | Attending: Student | Admitting: Student

## 2018-11-29 ENCOUNTER — Other Ambulatory Visit: Payer: Self-pay

## 2018-11-29 ENCOUNTER — Inpatient Hospital Stay
Admission: RE | Admit: 2018-11-29 | Discharge: 2018-12-01 | DRG: 885 | Disposition: A | Discharge status: Home / Self Care | Payer: Medicare Other | Source: Intra-hospital | Attending: Psychiatry | Admitting: Psychiatry

## 2018-11-29 DIAGNOSIS — R45851 Suicidal ideations: Secondary | ICD-10-CM | POA: Diagnosis present

## 2018-11-29 DIAGNOSIS — F419 Anxiety disorder, unspecified: Secondary | ICD-10-CM | POA: Diagnosis present

## 2018-11-29 DIAGNOSIS — X789XXA Intentional self-harm by unspecified sharp object, initial encounter: Secondary | ICD-10-CM

## 2018-11-29 DIAGNOSIS — Z888 Allergy status to other drugs, medicaments and biological substances status: Secondary | ICD-10-CM | POA: Diagnosis not present

## 2018-11-29 DIAGNOSIS — R443 Hallucinations, unspecified: Secondary | ICD-10-CM

## 2018-11-29 DIAGNOSIS — Z886 Allergy status to analgesic agent status: Secondary | ICD-10-CM

## 2018-11-29 DIAGNOSIS — F1290 Cannabis use, unspecified, uncomplicated: Secondary | ICD-10-CM | POA: Diagnosis present

## 2018-11-29 DIAGNOSIS — Z20828 Contact with and (suspected) exposure to other viral communicable diseases: Secondary | ICD-10-CM | POA: Diagnosis not present

## 2018-11-29 DIAGNOSIS — F17200 Nicotine dependence, unspecified, uncomplicated: Secondary | ICD-10-CM | POA: Diagnosis not present

## 2018-11-29 DIAGNOSIS — R451 Restlessness and agitation: Secondary | ICD-10-CM | POA: Diagnosis present

## 2018-11-29 DIAGNOSIS — F1414 Cocaine abuse with cocaine-induced mood disorder: Secondary | ICD-10-CM | POA: Diagnosis not present

## 2018-11-29 DIAGNOSIS — R440 Auditory hallucinations: Secondary | ICD-10-CM | POA: Diagnosis present

## 2018-11-29 DIAGNOSIS — F1210 Cannabis abuse, uncomplicated: Secondary | ICD-10-CM | POA: Diagnosis present

## 2018-11-29 DIAGNOSIS — F250 Schizoaffective disorder, bipolar type: Secondary | ICD-10-CM | POA: Diagnosis not present

## 2018-11-29 DIAGNOSIS — F259 Schizoaffective disorder, unspecified: Secondary | ICD-10-CM | POA: Diagnosis present

## 2018-11-29 DIAGNOSIS — Z79899 Other long term (current) drug therapy: Secondary | ICD-10-CM | POA: Diagnosis not present

## 2018-11-29 DIAGNOSIS — G4700 Insomnia, unspecified: Secondary | ICD-10-CM | POA: Diagnosis present

## 2018-11-29 DIAGNOSIS — Z915 Personal history of self-harm: Secondary | ICD-10-CM | POA: Insufficient documentation

## 2018-11-29 DIAGNOSIS — F17210 Nicotine dependence, cigarettes, uncomplicated: Secondary | ICD-10-CM | POA: Diagnosis present

## 2018-11-29 DIAGNOSIS — IMO0002 Reserved for concepts with insufficient information to code with codable children: Secondary | ICD-10-CM

## 2018-11-29 DIAGNOSIS — S61511A Laceration without foreign body of right wrist, initial encounter: Secondary | ICD-10-CM

## 2018-11-29 DIAGNOSIS — Z8659 Personal history of other mental and behavioral disorders: Secondary | ICD-10-CM

## 2018-11-29 DIAGNOSIS — Z9114 Patient's other noncompliance with medication regimen: Secondary | ICD-10-CM

## 2018-11-29 DIAGNOSIS — F1494 Cocaine use, unspecified with cocaine-induced mood disorder: Secondary | ICD-10-CM | POA: Diagnosis present

## 2018-11-29 HISTORY — DX: Suicidal ideations: R45.851

## 2018-11-29 LAB — COMPREHENSIVE METABOLIC PANEL
ALT: 14 U/L (ref 0–44)
AST: 17 U/L (ref 15–41)
Albumin: 4.4 g/dL (ref 3.5–5.0)
Alkaline Phosphatase: 61 U/L (ref 38–126)
Anion gap: 6 (ref 5–15)
BUN: 8 mg/dL (ref 6–20)
CO2: 25 mmol/L (ref 22–32)
Calcium: 9 mg/dL (ref 8.9–10.3)
Chloride: 107 mmol/L (ref 98–111)
Creatinine, Ser: 1.07 mg/dL (ref 0.61–1.24)
GFR calc Af Amer: >60 mL/min (ref >60)
GFR calc non Af Amer: >60 mL/min (ref >60)
Glucose, Bld: 93 mg/dL (ref 70–99)
Potassium: 3.6 mmol/L (ref 3.5–5.1)
Sodium: 138 mmol/L (ref 135–145)
Total Bilirubin: 0.6 mg/dL (ref 0.3–1.2)
Total Protein: 8 g/dL (ref 6.5–8.1)

## 2018-11-29 LAB — URINE DRUG SCREEN, QUALITATIVE (ARMC ONLY)
Amphetamines, Ur Screen: NOT DETECTED
Barbiturates, Ur Screen: NOT DETECTED
Benzodiazepine, Ur Scrn: NOT DETECTED
Cannabinoid 50 Ng, Ur ~~LOC~~: NOT DETECTED
Cocaine Metabolite,Ur ~~LOC~~: POSITIVE — AB
MDMA (Ecstasy)Ur Screen: NOT DETECTED
Methadone Scn, Ur: NOT DETECTED
Opiate, Ur Screen: NOT DETECTED
Phencyclidine (PCP) Ur S: NOT DETECTED
Tricyclic, Ur Screen: NOT DETECTED

## 2018-11-29 LAB — CBC
HCT: 46.5 % (ref 39.0–52.0)
Hemoglobin: 15.1 g/dL (ref 13.0–17.0)
MCH: 29.1 pg (ref 26.0–34.0)
MCHC: 32.5 g/dL (ref 30.0–36.0)
MCV: 89.6 fL (ref 80.0–100.0)
Platelets: 207 10*3/uL (ref 150–400)
RBC: 5.19 MIL/uL (ref 4.22–5.81)
RDW: 13.4 % (ref 11.5–15.5)
WBC: 5.5 10*3/uL (ref 4.0–10.5)
nRBC: 0 % (ref 0.0–0.2)

## 2018-11-29 LAB — ETHANOL: Alcohol, Ethyl (B): <10 mg/dL (ref <10)

## 2018-11-29 LAB — SARS CORONAVIRUS 2 (TAT 6-24 HRS): SARS Coronavirus 2: NEGATIVE

## 2018-11-29 LAB — SALICYLATE LEVEL: Salicylate Lvl: <7 mg/dL (ref 2.8–30.0)

## 2018-11-29 LAB — ACETAMINOPHEN LEVEL: Acetaminophen (Tylenol), Serum: <10 ug/mL — ABNORMAL LOW (ref 10–30)

## 2018-11-29 MED ORDER — HALOPERIDOL 1 MG PO TABS
2.0000 mg | ORAL_TABLET | Freq: Two times a day (BID) | ORAL | Status: DC
  Administered 2018-11-29 – 2018-11-30 (×2): 2 mg via ORAL
  Filled 2018-11-29 (×2): qty 2

## 2018-11-29 MED ORDER — ADULT MULTIVITAMIN W/MINERALS CH
1.0000 | ORAL_TABLET | Freq: Every day | ORAL | Status: DC
  Administered 2018-11-30: 08:00:00 1 via ORAL
  Filled 2018-11-29: qty 1

## 2018-11-29 MED ORDER — DIVALPROEX SODIUM ER 500 MG PO TB24
500.0000 mg | ORAL_TABLET | Freq: Every day | ORAL | Status: DC
  Administered 2018-11-30: 500 mg via ORAL
  Filled 2018-11-29: qty 1

## 2018-11-29 MED ORDER — MAGNESIUM HYDROXIDE 400 MG/5ML PO SUSP: 30.0000 mL | Freq: Every day | ORAL | Status: DC | PRN

## 2018-11-29 MED ORDER — ALUM & MAG HYDROXIDE-SIMETH 200-200-20 MG/5ML PO SUSP: 30.0000 mL | ORAL | Status: DC | PRN

## 2018-11-29 MED ORDER — BENZTROPINE MESYLATE 1 MG PO TABS
0.5000 mg | ORAL_TABLET | Freq: Every day | ORAL | Status: DC
  Administered 2018-11-30: 08:00:00 0.5 mg via ORAL
  Filled 2018-11-29: qty 1

## 2018-11-29 MED ORDER — ACETAMINOPHEN 325 MG PO TABS: 650.0000 mg | ORAL_TABLET | Freq: Four times a day (QID) | ORAL | Status: DC | PRN

## 2018-11-29 NOTE — ED Triage Notes (Signed)
Patient recently moved back to area from [place]. EMS states he has not had psych meds in 1.5 months and take a once a month injection. Patient states having SI with plan to cut self. Patient has a 0.5 inch lac on right wrist that is superficial. No bleeding noted at time of triage. Patient redirected multiple times to leave alone. Patient states he is hearing God tell him to do things. Patient could not tell this writer exactly what the voices are telling him. He also states he is seeing hallucinations of a hand. Patient is tearful and states he just don't know what is real anymore.

## 2018-11-29 NOTE — BH Assessment (Addendum)
Pt needs COVID test/result before transfer to BMU. Order submitted.  Attempted to call pt's nurse - No answer.

## 2018-11-29 NOTE — ED Notes (Signed)
IVC  PENDING  GOING  TO  [facility]  MED  TONIGHT 

## 2018-11-29 NOTE — ED Notes (Signed)
Patient reports no longer feeling SI, states he wants to leave and go to hiis mothers house. Parent has called multiple times today and poke to her son.

## 2018-11-29 NOTE — BH Assessment (Signed)
Patient is to be admitted to [facility] by Dr. Claris Gower.  Attending Physician will be Dr. Weber Cooks.   Patient has been assigned to room 315, by [place].   Intake Paper Work has been signed and placed on patient chart.   ER staff is aware of the admission:  Lattie Haw, ER Secretary    Dr. Quentin Cornwall, ER MD   Tomasa Hosteller, Patient's Nurse   Elberta Fortis, Patient Access.
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
## 2018-11-29 NOTE — ED Provider Notes (Signed)
[facility] Emergency Department Provider Note  ____________________________________________   First MD Initiated Contact with Patient 11/29/18 0226     (approximate)  I have reviewed the triage vital signs and the nursing notes.  History  Chief Complaint Psychiatric Evaluation    HPI [name] is a 28 y.o. male with a history of schizophrenia, cocaine abuse who presents to the emergency department for suicidal ideation with plan to cut himself.  He does have a <1 cm very superficial laceration to the right distal wrist, volar aspect.  He reports having both auditory and visual hallucinations.  He states he is hearing God tell him to do things.  He is having visual hallucinations of himself walking into traffic.  He reports being out of his psychiatric medications for at least a month and a half.  He does admit to recent cocaine use yesterday.   Past Medical Hx Past Medical History:  Diagnosis Date  . Schizophrenia [facility])     Problem List Patient Active Problem List   Diagnosis Date Noted  . Cocaine abuse with cocaine-induced mood disorder (HCC) 11/06/2018  . Agitation   . Schizoaffective disorder, bipolar type (HCC) 11/10/2017  . Cannabis abuse 11/10/2017    Past Surgical Hx History reviewed. No pertinent surgical history.  Medications Prior to Admission medications   Medication Sig Start Date End Date Taking? Authorizing Provider  ARIPiprazole (ABILIFY) 5 MG tablet Take 5 mg by mouth 2 (two) times daily. 04/09/18   [provider]  benztropine (COGENTIN) 0.5 MG tablet Take 0.5 mg by mouth daily. At 3 pm 04/05/18   [provider]  divalproex (DEPAKOTE ER) 500 MG 24 hr tablet Take 500 mg by mouth daily. 04/05/18   [provider]  haloperidol (HALDOL) 2 MG tablet Take 2 mg by mouth 2 (two) times daily. 09/17/18   [provider]  haloperidol decanoate (HALDOL DECANOATE) 100 MG/ML injection Inject 1 mL into the  muscle every 28 (twenty-eight) days. 05/03/18 05/03/19  [provider]  Multiple Vitamin (MULTI-VITAMIN) tablet Take 1 tablet by mouth daily.    [provider]  traZODone (DESYREL) 50 MG tablet Take 50 mg by mouth at bedtime. 04/05/18   [provider]    Allergies Ibuprofen, Risperidone and related, Ziprasidone, and Quetiapine  Family Hx History reviewed. No pertinent family history.  Social Hx Social History   Tobacco Use  . Smoking status: Current Every Day Smoker  . Smokeless tobacco: Never Used  Substance Use Topics  . Alcohol use: Yes  . Drug use: Yes    Types: Cocaine    Comment: 11/28/2018     Review of Systems  Constitutional: Negative for fever, chills. Eyes: Negative for visual changes. ENT: Negative for sore throat. Cardiovascular: Negative for chest pain. Respiratory: Negative for shortness of breath. Gastrointestinal: Negative for nausea, vomiting.  Genitourinary: Negative for dysuria. Musculoskeletal: Negative for leg swelling. Skin: + superficial laceration Neurological: Negative for for headaches. Psychiatric: Positive for hallucinations.   Physical Exam  Vital Signs: ED Triage Vitals  Enc Vitals Group     BP 11/29/18 0224 123/87     Pulse Rate 11/29/18 0224 60     Resp 11/29/18 0224 16     Temp 11/29/18 0224 98 F (36.7 C)     Temp Source 11/29/18 0224 Oral     SpO2 11/29/18 0224 98 %     Weight 11/29/18 0230 150 lb (68 kg)     Height 11/29/18 0230 5'  11" (1.803 m)     Head Circumference --      Peak Flow --      Pain Score 11/29/18 0230 0     Pain Loc --      Pain Edu? --      Excl. in [place]? --     Constitutional: Alert and oriented.  Head: Normocephalic. Atraumatic. Eyes: Conjunctivae clear. Sclera anicteric. Nose: No congestion. No rhinorrhea. Mouth/Throat: Mucous membranes are moist.  Neck: No stridor.   Cardiovascular: Normal rate. Extremities well perfused.  2+ radial pulse.  Fingers warm and  well-perfused. Respiratory: Normal respiratory effort.   Gastrointestinal: Non-distended.  Musculoskeletal: No deformities.  Compartments are soft and compressible. Neurologic:  Normal speech and language. No gross focal neurologic deficits are appreciated.  Motor/sensation intact to R radial, ulnar, median distribution. Skin: Very superficial, less than 1 cm laceration to the distal R wrist, volar aspect.  Hemostatic with no significant depth.  No foreign body appreciated. Psychiatric: Reports hallucinations, SI.  EKG  N/A    Radiology  N/A   Procedures  Procedure(s) performed (including critical care):  Procedures   Initial Impression / Assessment and Plan / ED Course  28 y.o. male who presents to the ED for hallucinations, SI, self-harm.  As above.  Exam as above, self-inflicted wound is extremely superficial without evidence of foreign body, active bleeding, nor deeper tissue injury.  He is N/V intact.  This does not require repair.  Suspect patient's presentation is related to their known psychiatric diagnosis. Likely worsened by recent cocaine use. No evidence of underlying metabolic, infectious, etiology. Will obtain basic screening labs and consult psychiatry and TTS.   Given his history, medication non-compliance, active hallucinations, and self harm, will place under IVC.   Final Clinical Impression(s) / ED Diagnosis  Final diagnoses:  Hallucinations  Hx of schizophrenia  History of self-harm       Note:  This document was prepared using Dragon voice recognition software and may include unintentional dictation errors.   Lilia Pro., MD 11/29/18 [phone]

## 2018-11-29 NOTE — ED Notes (Signed)
Patient yelling from his room " you people don't feed anyone around here". Patient was advised that lunch is pending delivery. Snack offered. Patient declined

## 2018-11-29 NOTE — ED Notes (Signed)
Pt asleep, lunch tray placed in rm. 

## 2018-11-29 NOTE — ED Notes (Signed)
Pt woke up and given breakfast tray.

## 2018-11-29 NOTE — ED Notes (Signed)
Assumed care of patient patient sleepy resistant to answering questions, denies SI/HV/HI. Patient awaiting re-eval and disposition this morning.

## 2018-11-29 NOTE — Consult Note (Signed)
  Patient reassessed after being previously assessed last night.  Patient continues to complain of symptoms of depression, SI, and auditory hallucinations.  At this point patient will be admitted to the psychiatric hospital.  IVC in place.

## 2018-11-29 NOTE — Consult Note (Signed)
[facility] Consult   Reason for Consult: Cocaine abuse with suicidal ideation Referring Physician: Dr. Joan Mayans Patient Identification: [name] MRN:  [id number] Principal Diagnosis: Suicidal ideation Diagnosis:  Principal Problem:   Suicidal ideation Active Problems:   Schizoaffective disorder, bipolar type ([place])   Cannabis abuse   Cocaine abuse with cocaine-induced mood disorder ([place])   Agitation   Total Time spent with patient: 45 minutes  Subjective: "I was going to kill myself." [name] is a 28 y.o. male patient presented to [facility] ED via EMS voluntarily and then he was involuntarily committed (IVC) by the EDP. The patient was becoming agitated on arrival to the ED. Per the ED triage nursing notes, it was discussed that the patient stated he had not had any of his psychiatric medications for 1 and a 1/2 months.  The patient is unaware of his last Haldol Decanoate injection.  During his assessment, he voiced he had used cocaine and stated that he is suicidal but does not plan.  The patient states, "look at my wrist. I cut myself, and I do not know what is happening to me."  He is answering questions with long pauses after questioning has been posted to him.  The patient is also humming songs during his assessment. The patient was seen face-to-face by this provider; chart reviewed and consulted with Dr. Joan Mayans on 11/29/2018 due to the patient's care. It was discussed with the EDP that the patient would be observed overnight and reassess in the a.m to determine if he meets criteria for psychiatric inpatient admission, substance abuse treatment, or can be discharged home.  The patient is alert and oriented x 3, calm, cooperative, and mood is non-congruent with affect on evaluation. The patient does not appear to be responding to internal or external stimuli. Neither is the patient presenting with delusional thinking. The patient denies auditory or visual hallucinations.   He keeps voicing, "I do not know what is happening to me."  The patient admits to suicidal and self-harm ideations but denies homicidal ideation. The patient is not presenting with psychotic or paranoid behaviors. During an encounter with the patient, he was able to answer some questions appropriately. This provider did not obtain collateral. The patient's sister Ms. Leonia Reeves [phone].     Plan: The patient will be observed overnight and reassess in the a.m to determine if he meets criteria for psychiatric inpatient admission, substance abuse treatment, or can be discharged home.    HPI: Per Dr. Joan Mayans; [name] is a 28 y.o. male with a history of schizophrenia, cocaine abuse who presents to the emergency department for suicidal ideation with plan to cut himself.  He does have a <1 cm very superficial laceration to the right distal wrist, volar aspect.  He reports having both auditory and visual hallucinations.  He states he is hearing God tell him to do things.  He is having visual hallucinations of himself walking into traffic.  He reports being out of his psychiatric medications for at least a month and a half.  He does admit to recent cocaine use yesterday.  Past Psychiatric History:  Schizophrenia  Risk to Self:  No Risk to Others:  No Prior Inpatient Therapy:  Yes Prior Outpatient Therapy:  Yes  Past Medical History:  Past Medical History:  Diagnosis Date  . Schizophrenia ([place])    History reviewed. No pertinent surgical history. Family History: History reviewed. No pertinent family history. Family Psychiatric  History: Unknown Social History:  Social History   Substance and Sexual Activity  Alcohol Use Yes     Social History   Substance and Sexual Activity  Drug Use Yes  . Types: Cocaine   Comment: 11/28/2018    Social History   Socioeconomic History  . Marital status: Single    Spouse name: Not on file  . Number of children: Not on file  . Years of education:  Not on file  . Highest education level: Not on file  Occupational History  . Not on file  Social Needs  . Financial resource strain: Not on file  . Food insecurity    Worry: Not on file    Inability: Not on file  . Transportation needs    Medical: Not on file    Non-medical: Not on file  Tobacco Use  . Smoking status: Current Every Day Smoker  . Smokeless tobacco: Never Used  Substance and Sexual Activity  . Alcohol use: Yes  . Drug use: Yes    Types: Cocaine    Comment: 11/28/2018  . Sexual activity: Not on file  Lifestyle  . Physical activity    Days per week: Not on file    Minutes per session: Not on file  . Stress: Not on file  Relationships  . Social Musicianconnections    Talks on phone: Not on file    Gets together: Not on file    Attends religious service: Not on file    Active member of club or organization: Not on file    Attends meetings of clubs or organizations: Not on file    Relationship status: Not on file  Other Topics Concern  . Not on file  Social History Narrative  . Not on file   Additional Social History:    Allergies:   Allergies  Allergen Reactions  . Ibuprofen Swelling    Tongue swelling  . Risperidone And Related Other (See Comments) and Swelling    gynecomastia gynecomastia   . Ziprasidone Swelling    Tongue swells  . Quetiapine Other (See Comments)    Depression, suicidality, adverse effect: seizures     Labs:  Results for orders placed or performed during the hospital encounter of 11/29/18 (from the past 48 hour(s))  CBC     Status: None   Collection Time: 11/29/18  2:24 AM  Result Value Ref Range   WBC 5.5 4.0 - 10.5 K/uL   RBC 5.19 4.22 - 5.81 MIL/uL   Hemoglobin 15.1 13.0 - 17.0 g/dL   HCT 78.246.5 95.639.0 - 21.352.0 %   MCV 89.6 80.0 - 100.0 fL   MCH 29.1 26.0 - 34.0 pg   MCHC 32.5 30.0 - 36.0 g/dL   RDW 08.613.4 57.811.5 - 46.915.5 %   Platelets 207 150 - 400 K/uL   nRBC 0.0 0.0 - 0.2 %    Comment: Performed at [facility], [address] [place], [place] [id number]  Comprehensive metabolic panel     Status: None   Collection Time: 11/29/18  2:24 AM  Result Value Ref Range   Sodium 138 135 - 145 mmol/L   Potassium 3.6 3.5 - 5.1 mmol/L   Chloride 107 98 - 111 mmol/L   CO2 25 22 - 32 mmol/L   Glucose, Bld 93 70 - 99 mg/dL   BUN 8 6 - 20 mg/dL   Creatinine, Ser 8.411.07 0.61 - 1.24 mg/dL   Calcium 9.0 8.9 - 32.410.3 mg/dL   Total Protein 8.0 6.5 - 8.1  g/dL   Albumin 4.4 3.5 - 5.0 g/dL   AST 17 15 - 41 U/L   ALT 14 0 - 44 U/L   Alkaline Phosphatase 61 38 - 126 U/L   Total Bilirubin 0.6 0.3 - 1.2 mg/dL   GFR calc non Af Amer >60 >60 mL/min   GFR calc Af Amer >60 >60 mL/min   Anion gap 6 5 - 15    Comment: Performed at [facility], [address], [place], [place] [address]  Ethanol     Status: None   Collection Time: 11/29/18  2:24 AM  Result Value Ref Range   Alcohol, Ethyl (B) <10 <10 mg/dL    Comment: (NOTE) Lowest detectable limit for serum alcohol is 10 mg/dL. For medical purposes only. Performed at [facility] [address] [place], [place] [address]     No current facility-administered medications for this encounter.    Current Outpatient Medications  Medication Sig Dispense Refill  . ARIPiprazole (ABILIFY) 5 MG tablet Take 5 mg by mouth 2 (two) times daily.    . benztropine (COGENTIN) 0.5 MG tablet Take 0.5 mg by mouth daily. At 3 pm    . divalproex (DEPAKOTE ER) 500 MG 24 hr tablet Take 500 mg by mouth daily.    . haloperidol (HALDOL) 2 MG tablet Take 2 mg by mouth 2 (two) times daily.    . haloperidol decanoate (HALDOL DECANOATE) 100 MG/ML injection Inject 1 mL into the muscle every 28 (twenty-eight) days.    . Multiple Vitamin (MULTI-VITAMIN) tablet Take 1 tablet by mouth daily.    . traZODone (DESYREL) 50 MG tablet Take 50 mg by mouth at bedtime.      Musculoskeletal: Strength & Muscle Tone: within normal limits Gait & Station: normal Patient leans:  N/A  Psychiatric Specialty Exam: Physical Exam  Nursing note and vitals reviewed. Constitutional: He is oriented to person, place, and time. He appears well-developed and well-nourished.  Neck: Normal range of motion. Neck supple.  Cardiovascular: Normal rate.  Respiratory: Effort normal.  Musculoskeletal: Normal range of motion.  Neurological: He is alert and oriented to person, place, and time.    Review of Systems  Psychiatric/Behavioral: Positive for substance abuse. The patient is nervous/anxious.   All other systems reviewed and are negative.   Blood pressure 123/87, pulse 60, temperature 98 F (36.7 C), temperature source Oral, resp. rate 16, height 5\' 11"  (1.803 m), weight 68 kg, SpO2 98 %.Body mass index is 20.92 kg/m.  General Appearance: Casual  Eye Contact:  Minimal  Speech:  Garbled and Slow  Volume:  Decreased  Mood:  Euthymic  Affect:  Non-Congruent, Depressed and Inappropriate  Thought Process:  Coherent and Goal Directed  Orientation:  Full (Time, Place, and Person)  Thought Content:  WDL and Logical  Suicidal Thoughts:  Yes.  without intent/plan  Homicidal Thoughts:  No  Memory:  Immediate;   Good Recent;   Good Remote;   Fair  Judgement:  Fair  Insight:  Fair  Psychomotor Activity:  Normal  Concentration:  Concentration: Good and Attention Span: Good  Recall:  of Knowledge:  Fair  Language:  Fair  Akathisia:  Negative  Handed:  Right  AIMS (if indicated):     Assets:  Communication Skills Desire for Improvement Social Support  ADL's:  Intact  Cognition:  WNL  Sleep:        Treatment Plan Summary: Daily contact with patient to assess and evaluate symptoms and  progress in treatment, Medication management and Plan Patient will be observed overnight and reassess in the a.m. to determine if he meets criteria for psychiatric inpatient admission or can be discharged back home.  Patient patient home medications can be restarted once pharmacy  has has completed his medication reconciliation  Disposition: No evidence of imminent risk to self or others at present.   Supportive therapy provided about ongoing stressors. Patient will be observed overnight and reassess in the a.m. to determine if he meets criteria for psychiatric inpatient admission or can be discharged back home.   Gillermo Murdoch, NP 11/29/2018 3:22 AM

## 2018-11-29 NOTE — ED Notes (Signed)
Patient laying in bed quietly aware he will be going to lower level BH unit after shift change at 7pm. Awaiting dinner tray.Marland Kitchen

## 2018-11-29 NOTE — ED Notes (Signed)
Covid obtained and walked over to lab.

## 2018-11-30 ENCOUNTER — Other Ambulatory Visit: Payer: Self-pay

## 2018-11-30 DIAGNOSIS — S61511A Laceration without foreign body of right wrist, initial encounter: Secondary | ICD-10-CM

## 2018-11-30 DIAGNOSIS — F250 Schizoaffective disorder, bipolar type: Secondary | ICD-10-CM

## 2018-11-30 DIAGNOSIS — X789XXA Intentional self-harm by unspecified sharp object, initial encounter: Secondary | ICD-10-CM

## 2018-11-30 HISTORY — DX: Laceration without foreign body of right wrist, initial encounter: S61.511A

## 2018-11-30 LAB — LIPID PANEL
Cholesterol: 121 mg/dL (ref 0–200)
HDL: 39 mg/dL — ABNORMAL LOW (ref >40)
LDL Cholesterol: 62 mg/dL (ref 0–99)
Total CHOL/HDL Ratio: 3.1 RATIO
Triglycerides: 102 mg/dL (ref <150)
VLDL: 20 mg/dL (ref 0–40)

## 2018-11-30 LAB — HEPATIC FUNCTION PANEL
ALT: 13 U/L (ref 0–44)
AST: 17 U/L (ref 15–41)
Albumin: 3.6 g/dL (ref 3.5–5.0)
Alkaline Phosphatase: 52 U/L (ref 38–126)
Bilirubin, Direct: <0.1 mg/dL (ref 0.0–0.2)
Total Bilirubin: 0.5 mg/dL (ref 0.3–1.2)
Total Protein: 6.8 g/dL (ref 6.5–8.1)

## 2018-11-30 LAB — VALPROIC ACID LEVEL: Valproic Acid Lvl: <10 ug/mL — ABNORMAL LOW (ref 50.0–100.0)

## 2018-11-30 LAB — TSH: TSH: 0.272 u[IU]/mL — ABNORMAL LOW (ref 0.350–4.500)

## 2018-11-30 MED ORDER — ARIPIPRAZOLE 10 MG PO TABS
10.0000 mg | ORAL_TABLET | Freq: Every day | ORAL | Status: DC
  Administered 2018-11-30: 15:00:00 10 mg via ORAL
  Filled 2018-11-30: qty 1

## 2018-11-30 MED ORDER — ARIPIPRAZOLE ER 400 MG IM SRER
400.0000 mg | INTRAMUSCULAR | Status: DC
  Administered 2018-11-30: 16:00:00 400 mg via INTRAMUSCULAR
  Filled 2018-11-30 (×2): qty 2

## 2018-11-30 NOTE — H&P (Signed)
Psychiatric Admission Assessment Adult  Patient Identification: [name] MRN:  [id number] Date of Evaluation:  11/30/2018 Chief Complaint:  schizophrenia Principal Diagnosis: Schizoaffective disorder ([place]) Diagnosis:  Principal Problem:   Schizoaffective disorder ([place]) Active Problems:   Cannabis abuse   Cocaine abuse with cocaine-induced mood disorder ([place])   Suicidal ideation   Self-inflicted laceration of right wrist ([place])  History of Present Illness: Patient seen chart reviewed.  This is a 28 year old man was brought by EMS to the emergency room and subsequently was put under commitment.  It sounds like from the chart on his initial presentation he was rather agitated and highly emotional and making suicidal statements and endorsing psychotic symptoms.  After getting some rest and perhaps some medication the patient is now minimizing all of that.  Nevertheless he did cut himself on his right wrist albeit very superficially.  He tells me now that he thinks that he did it "in a dream".  He admits that he has been off of his psychiatric medicine for an unknown period of time.  He admits that he used some cocaine although he plays it down as though it were an extremely rare occurrence and he denies other drugs or alcohol.  Patient is completely denying suicidal or homicidal ideation now.  Denies any hallucinations.  He remains very focused on getting discharged. Associated Signs/Symptoms: Depression Symptoms:  insomnia, suicidal attempt, anxiety, (Hypo) Manic Symptoms:  Distractibility, Impulsivity, Anxiety Symptoms:  Excessive Worry, Psychotic Symptoms:  Hallucinations: Auditory Paranoia, PTSD Symptoms: Negative Total Time spent with patient: 1 hour  Past Psychiatric History: Patient has a long history of chronic mental illness dating back to childhood.  Has had multiple hospitalizations and an even larger number of emergency room visits.  Looking back at his old chart this seems  like a typical pattern that he will come in in great distress usually related to recent abuse of substances.  Usually his symptoms clear up fairly quickly and then his insight is limited.  He has been maintained with better stability when he is on long-acting injectable medicine in the past.  Last time I saw him he came through our emergency room and we gave him a Abilify maintain a injection and let him go.  Patient used to be seen in [place] and only recently moved here to [place] again.  Previous medicines look like it has included Depakote either Abilify or Haldol and Cogentin.  Patient has had at least 1 suicide attempt.  He tends to minimize it and sometimes deny it but it seems from the old records that he has had more serious events of self-harm in the past.  Denies any history of violence.  Is the patient at risk to self? Yes.    Has the patient been a risk to self in the past 6 months? Yes.    Has the patient been a risk to self within the distant past? Yes.    Is the patient a risk to others? No.  Has the patient been a risk to others in the past 6 months? No.  Has the patient been a risk to others within the distant past? No.   Prior Inpatient Therapy:   Prior Outpatient Therapy:    Alcohol Screening:   Substance Abuse History in the last 12 months:  Yes.   Consequences of Substance Abuse: Medical Consequences:  It seems pretty clear that substance abuse is a major trigger for worsening his symptoms and causing him to engage in self-harm or  become more psychotic Previous Psychotropic Medications: Yes  Psychological Evaluations: Yes  Past Medical History:  Past Medical History:  Diagnosis Date   Schizophrenia ([place])    History reviewed. No pertinent surgical history. Family History: History reviewed. No pertinent family history. Family Psychiatric  History: He denies knowing of any Tobacco Screening:   Social History:  Social History   Substance and Sexual Activity    Alcohol Use Yes     Social History   Substance and Sexual Activity  Drug Use Yes   Types: Cocaine   Comment: 11/28/2018    Additional Social History: Marital status: Single Are you sexually active?: Yes What is your sexual orientation?: heterosexual Has your sexual activity been affected by drugs, alcohol, medication, or emotional stress?: pt denies Does patient have children?: No                         Allergies:   Allergies  Allergen Reactions   Ibuprofen Swelling    Tongue swelling   Risperidone And Related Other (See Comments) and Swelling    gynecomastia gynecomastia    Ziprasidone Swelling    Tongue swells   Quetiapine Other (See Comments)    Depression, suicidality, adverse effect: seizures    Lab Results:  Results for orders placed or performed during the hospital encounter of 11/29/18 (from the past 48 hour(s))  Acetaminophen level     Status: Abnormal   Collection Time: 11/29/18  2:24 AM  Result Value Ref Range   Acetaminophen (Tylenol), Serum <10 (L) 10 - 30 ug/mL    Comment: (NOTE) Therapeutic concentrations vary significantly. A range of 10-30 ug/mL  may be an effective concentration for many patients. However, some  are best treated at concentrations outside of this range. Acetaminophen concentrations >150 ug/mL at 4 hours after ingestion  and >50 ug/mL at 12 hours after ingestion are often associated with  toxic reactions. Performed at [facility], [address] [place], [place] [address]   CBC     Status: None   Collection Time: 11/29/18  2:24 AM  Result Value Ref Range   WBC 5.5 4.0 - 10.5 K/uL   RBC 5.19 4.22 - 5.81 MIL/uL   Hemoglobin 15.1 13.0 - 17.0 g/dL   HCT 46.5 39.0 - 52.0 %   MCV 89.6 80.0 - 100.0 fL   MCH 29.1 26.0 - 34.0 pg   MCHC 32.5 30.0 - 36.0 g/dL   RDW 13.4 11.5 - 15.5 %   Platelets 207 150 - 400 K/uL   nRBC 0.0 0.0 - 0.2 %    Comment: Performed at [facility], [address]  [place], [place] [address]  Comprehensive metabolic panel     Status: None   Collection Time: 11/29/18  2:24 AM  Result Value Ref Range   Sodium 138 135 - 145 mmol/L   Potassium 3.6 3.5 - 5.1 mmol/L   Chloride 107 98 - 111 mmol/L   CO2 25 22 - 32 mmol/L   Glucose, Bld 93 70 - 99 mg/dL   BUN 8 6 - 20 mg/dL   Creatinine, Ser 1.07 0.61 - 1.24 mg/dL   Calcium 9.0 8.9 - 10.3 mg/dL   Total Protein 8.0 6.5 - 8.1 g/dL   Albumin 4.4 3.5 - 5.0 g/dL   AST 17 15 - 41 U/L   ALT 14 0 - 44 U/L   Alkaline Phosphatase 61 38 - 126 U/L   Total Bilirubin 0.6 0.3 -  1.2 mg/dL   GFR calc non Af Amer >60 >60 mL/min   GFR calc Af Amer >60 >60 mL/min   Anion gap 6 5 - 15    Comment: Performed at [facility], [address] [place], [place] [address]  Ethanol     Status: None   Collection Time: 11/29/18  2:24 AM  Result Value Ref Range   Alcohol, Ethyl (B) <10 <10 mg/dL    Comment: (NOTE) Lowest detectable limit for serum alcohol is 10 mg/dL. For medical purposes only. Performed at [facility], [address] [place], [place] [address]   Salicylate level     Status: None   Collection Time: 11/29/18  2:24 AM  Result Value Ref Range   Salicylate Lvl <0.7 2.8 - 30.0 mg/dL    Comment: Performed at [facility], [place], [place] [address]  SARS CORONAVIRUS 2 (TAT 6-24 HRS) Nasopharyngeal Nasopharyngeal Swab     Status: None   Collection Time: 11/29/18  1:24 PM   Specimen: Nasopharyngeal Swab  Result Value Ref Range   SARS Coronavirus 2 NEGATIVE NEGATIVE    Comment: (NOTE) SARS-CoV-2 target nucleic acids are NOT DETECTED. The SARS-CoV-2 RNA is generally detectable in upper and lower respiratory specimens during the acute phase of infection. Negative results do not preclude SARS-CoV-2 infection, do not rule out co-infections with other pathogens, and should not be used as the sole basis for treatment or other patient management decisions. Negative results  must be combined with clinical observations, patient history, and epidemiological information. The expected result is Negative. Fact Sheet for Patients: SugarRoll.be Fact Sheet for Healthcare Providers: https://www.woods-mathews.com/ This test is not yet approved or cleared by the [place] FDA and  has been authorized for detection and/or diagnosis of SARS-CoV-2 by FDA under an Emergency Use Authorization (EUA). This EUA will remain  in effect (meaning this test can be used) for the duration of the COVID-19 declaration under Section 56 4(b)(1) of the Act, 21 U.S.C. section 360bbb-3(b)(1), unless the authorization is terminated or revoked sooner. Performed at [facility], [address] [place], [name] [address]   Urine Drug Screen, Qualitative     Status: Abnormal   Collection Time: 11/29/18  8:53 PM  Result Value Ref Range   Tricyclic, Ur Screen NONE DETECTED NONE DETECTED   Amphetamines, Ur Screen NONE DETECTED NONE DETECTED   MDMA (Ecstasy)Ur Screen NONE DETECTED NONE DETECTED   Cocaine Metabolite,Ur [place] POSITIVE (A) NONE DETECTED   Opiate, Ur Screen NONE DETECTED NONE DETECTED   Phencyclidine (PCP) Ur S NONE DETECTED NONE DETECTED   Cannabinoid 50 Ng, Ur [place] NONE DETECTED NONE DETECTED   Barbiturates, Ur Screen NONE DETECTED NONE DETECTED   Benzodiazepine, Ur Scrn NONE DETECTED NONE DETECTED   Methadone Scn, Ur NONE DETECTED NONE DETECTED    Comment: (NOTE) Tricyclics + metabolites, urine    Cutoff 1000 ng/mL Amphetamines + metabolites, urine  Cutoff 1000 ng/mL MDMA (Ecstasy), urine              Cutoff 500 ng/mL Cocaine Metabolite, urine          Cutoff 300 ng/mL Opiate + metabolites, urine        Cutoff 300 ng/mL Phencyclidine (PCP), urine         Cutoff 25 ng/mL Cannabinoid, urine                 Cutoff 50 ng/mL Barbiturates + metabolites, urine  Cutoff 200 ng/mL  Benzodiazepine, urine              Cutoff 200  ng/mL Methadone, urine                   Cutoff 300 ng/mL The urine drug screen provides only a preliminary, unconfirmed analytical test result and should not be used for non-medical purposes. Clinical consideration and professional judgment should be applied to any positive drug screen result due to possible interfering substances. A more specific alternate chemical method must be used in order to obtain a confirmed analytical result. Gas chromatography / mass spectrometry (GC/MS) is the preferred confirmat ory method. Performed at [facility], [address] [place], [place] [address]     Blood Alcohol level:  Lab Results  Component Value Date   ETH <10 11/29/2018   ETH 39 (H) 98/11/9145    Metabolic Disorder Labs:  No results found for: HGBA1C, MPG No results found for: PROLACTIN No results found for: CHOL, TRIG, HDL, CHOLHDL, VLDL, LDLCALC  Current Medications: Current Facility-Administered Medications  Medication Dose Route Frequency Provider Last Rate Last Dose   acetaminophen (TYLENOL) tablet 650 mg  650 mg Oral Q6H PRN Cristofano, Dorene Ar, MD       alum & mag hydroxide-simeth (MAALOX/MYLANTA) 200-200-20 MG/5ML suspension 30 mL  30 mL Oral Q4H PRN Cristofano, Dorene Ar, MD       ARIPiprazole (ABILIFY) tablet 10 mg  10 mg Oral Daily Maanasa Aderhold T, MD       ARIPiprazole ER (ABILIFY MAINTENA) injection 400 mg  400 mg Intramuscular Q28 days Jewelianna Pancoast, Madie Reno, MD       benztropine (COGENTIN) tablet 0.5 mg  0.5 mg Oral Daily Cristofano, Paul A, MD   0.5 mg at 11/30/18 0800   divalproex (DEPAKOTE ER) 24 hr tablet 500 mg  500 mg Oral Daily Cristofano, Paul A, MD   500 mg at 11/30/18 0800   magnesium hydroxide (MILK OF MAGNESIA) suspension 30 mL  30 mL Oral Daily PRN Cristofano, Dorene Ar, MD       multivitamin with minerals tablet 1 tablet  1 tablet Oral Daily Cristofano, Dorene Ar, MD   1 tablet at 11/30/18 0800   PTA Medications: Medications Prior to Admission   Medication Sig Dispense Refill Last Dose   benztropine (COGENTIN) 0.5 MG tablet Take 0.5 mg by mouth daily. At 3 pm      divalproex (DEPAKOTE ER) 500 MG 24 hr tablet Take 500 mg by mouth daily.      haloperidol (HALDOL) 2 MG tablet Take 2 mg by mouth 2 (two) times daily.      haloperidol decanoate (HALDOL DECANOATE) 100 MG/ML injection Inject 1 mL into the muscle every 28 (twenty-eight) days.      Multiple Vitamin (MULTI-VITAMIN) tablet Take 1 tablet by mouth daily.      traZODone (DESYREL) 50 MG tablet Take 50 mg by mouth at bedtime.       Musculoskeletal: Strength & Muscle Tone: within normal limits Gait & Station: normal Patient leans: N/A  Psychiatric Specialty Exam: Physical Exam  Nursing note and vitals reviewed. Constitutional: He appears well-developed and well-nourished.  HENT:  Head: Normocephalic and atraumatic.  Eyes: Pupils are equal, round, and reactive to light. Conjunctivae are normal.  Neck: Normal range of motion.  Cardiovascular: Regular rhythm and normal heart sounds.  Respiratory: Effort normal.  GI: Soft.  Musculoskeletal: Normal range of motion.  Neurological: He is alert.  Skin: Skin is warm and dry.  Psychiatric: His behavior is normal. Thought content normal. His affect is blunt. His speech is delayed. Cognition and memory are impaired. He expresses inappropriate judgment.    Review of Systems  Constitutional: Negative.   HENT: Negative.   Eyes: Negative.   Respiratory: Negative.   Cardiovascular: Negative.   Gastrointestinal: Negative.   Musculoskeletal: Negative.   Skin: Negative.   Neurological: Negative.   Psychiatric/Behavioral: Positive for memory loss and substance abuse. Negative for depression, hallucinations and suicidal ideas. The patient has insomnia. The patient is not nervous/anxious.     Blood pressure 122/82, pulse (!) 51, temperature 97.9 F (36.6 C), temperature source Oral, resp. rate 16, SpO2 99 %.There is no height  or weight on file to calculate BMI.  General Appearance: Casual  Eye Contact:  Fair  Speech:  Slow  Volume:  Decreased  Mood:  Euthymic  Affect:  Constricted  Thought Process:  Disorganized  Orientation:  Full (Time, Place, and Person)  Thought Content:  Illogical  Suicidal Thoughts:  No  Homicidal Thoughts:  No  Memory:  Immediate;   Fair Recent;   Poor Remote;   Fair  Judgement:  Impaired  Insight:  Shallow  Psychomotor Activity:  Tremor  Concentration:  Concentration: Poor  Recall:  [employer] of Knowledge:  Fair  Language:  Fair  Akathisia:  No  Handed:  Right  AIMS (if indicated):     Assets:  Desire for Improvement Housing Physical Health Resilience  ADL's:  Intact  Cognition:  Impaired,  Mild  Sleep:  Number of Hours: 8    Treatment Plan Summary: Daily contact with patient to assess and evaluate symptoms and progress in treatment, Medication management and Plan Young man with schizophrenia and substance abuse came into the hospital early yesterday morning and at the time was endorsing psychotic symptoms and suicidal ideation.  On interview today he is denying more playing down all of that and is insistent on wanting to be discharged.  I advised the patient that I would not be able to discharge him today and that we needed to get him back on his proper medication because that would be a big help and staying away from the emergency room.  We also spoke about the importance of staying away from cocaine and other drugs.  I am going to have him get a 400 mg Abilify long-acting shot while also continuing some oral Abilify and the Cogentin.  We will get an EKG as well to make sure he does not have a problem with his QT.  I have tried to get in touch with his family both his sister and a number that I think may be for his mother and we will keep working on that.  He gave me permission to speak with both of them.  He has already met apparently with the representative from [facility] and  that will be where he is referred for follow-up at discharge.  Observation Level/Precautions:  15 minute checks  Laboratory:  EKG  Psychotherapy:    Medications:    Consultations:    Discharge Concerns:    Estimated LOS:  Other:     Physician Treatment Plan for Primary Diagnosis: Schizoaffective disorder ([place]) Long Term Goal(s): Improvement in symptoms so as ready for discharge  Short Term Goals: Ability to disclose and discuss suicidal ideas, Ability to demonstrate self-control will improve and Ability to identify and develop effective coping behaviors will improve  Physician Treatment Plan for Secondary Diagnosis: Principal Problem:  Schizoaffective disorder ([place]) Active Problems:   Cannabis abuse   Cocaine abuse with cocaine-induced mood disorder ([facility])   Suicidal ideation   Self-inflicted laceration of right wrist ([facility])  Long Term Goal(s): Improvement in symptoms so as ready for discharge  Short Term Goals: Ability to maintain clinical measurements within normal limits will improve and Compliance with prescribed medications will improve  I certify that inpatient services furnished can reasonably be expected to improve the patient's condition.    Alethia Berthold, MD 11/18/20201:31 PM

## 2018-11-30 NOTE — [facility] Suicide Risk Assessment (Signed)
[name] Admission Suicide Risk Assessment   Nursing information obtained from:  Patient Demographic factors:  Male, Unemployed Current Mental Status:  Self-harm behaviors Loss Factors:  NA Historical Factors:  Impulsivity Risk Reduction Factors:  Living with another person, especially a relative  Total Time spent with patient: 1 hour Principal Problem: Schizoaffective disorder ([facility]) Diagnosis:  Principal Problem:   Schizoaffective disorder ([facility]) Active Problems:   Cannabis abuse   Cocaine abuse with cocaine-induced mood disorder ([facility])   Suicidal ideation   Self-inflicted laceration of right wrist ([facility])  Subjective Data: Patient seen chart reviewed.  Patient with a well-known history of schizophrenia and substance abuse came to the [name] after very superficially cutting his wrist.  On first presentation apparently he was emotional and describing hallucinations and suicidal thoughts.  Since coming down it seems like he has reversed his complaints and is now insisting that his cutting his wrist was entirely accidental and totally denying any suicidal ideation or depressive symptoms.  Not currently receiving any mental health treatment  Continued Clinical Symptoms:    The "Alcohol Use Disorders Identification Test", Guidelines for Use in Primary Care, Second Edition.  [name]). Score between 0-7:  no or low risk or alcohol related problems. Score between 8-15:  moderate risk of alcohol related problems. Score between 16-19:  high risk of alcohol related problems. Score 20 or above:  warrants further diagnostic evaluation for alcohol dependence and treatment.   CLINICAL FACTORS:   Alcohol/Substance Abuse/Dependencies Schizophrenia:   Less than 59 years old   Musculoskeletal: Strength & Muscle Tone: within normal limits Gait & Station: normal Patient leans: N/A  Psychiatric Specialty Exam: Physical Exam  Nursing note and vitals reviewed. Constitutional: He  appears well-developed and well-nourished.  HENT:  Head: Normocephalic and atraumatic.  Eyes: Pupils are equal, round, and reactive to light. Conjunctivae are normal.  Neck: Normal range of motion.  Cardiovascular: Regular rhythm and normal heart sounds.  Respiratory: Effort normal. No respiratory distress.  GI: Soft.  Musculoskeletal: Normal range of motion.  Neurological: He is alert.  Skin: Skin is warm and dry.     Psychiatric: His speech is normal and behavior is normal. His affect is blunt. He expresses inappropriate judgment. He expresses no homicidal and no suicidal ideation. He exhibits abnormal recent memory.    Review of Systems  Constitutional: Negative.   HENT: Negative.   Eyes: Negative.   Respiratory: Negative.   Cardiovascular: Negative.   Gastrointestinal: Negative.   Musculoskeletal: Negative.   Skin: Negative.   Neurological: Negative.   Psychiatric/Behavioral: Positive for memory loss and substance abuse. Negative for depression, hallucinations and suicidal ideas. The patient is not nervous/anxious and does not have insomnia.     Blood pressure 122/82, pulse (!) 51, temperature 97.9 F (36.6 C), temperature source Oral, resp. rate 16, SpO2 99 %.There is no height or weight on file to calculate BMI.  General Appearance: Casual  Eye Contact:  Fair  Speech:  Slow  Volume:  Decreased  Mood:  Euthymic  Affect:  Constricted  Thought Process:  Coherent  Orientation:  Full (Time, Place, and Person)  Thought Content:  Illogical  Suicidal Thoughts:  No  Homicidal Thoughts:  No  Memory:  Immediate;   Fair Recent;   Poor Remote;   Fair  Judgement:  Impaired  Insight:  Lacking  Psychomotor Activity:  Tremor  Concentration:  Concentration: Fair  Recall:  Poor  Fund of Knowledge:  Fair  Language:  Fair  Akathisia:  No  Handed:  Right  AIMS (if indicated):     Assets:  Desire for Improvement Housing Physical Health Social Support  ADL's:  Intact   Cognition:  Impaired,  Mild  Sleep:  Number of Hours: 8      COGNITIVE FEATURES THAT CONTRIBUTE TO RISK:  Loss of executive function    SUICIDE RISK:   Mild:  Suicidal ideation of limited frequency, intensity, duration, and specificity.  There are no identifiable plans, no associated intent, mild dysphoria and related symptoms, good self-control (both objective and subjective assessment), few other risk factors, and identifiable protective factors, including available and accessible social support.  PLAN OF CARE: Continue 15-minute checks.  Restart psychiatric medicine.  Attempt to contact his family for collateral and discharge planning.  Reassess suicidality prior to discharge and make sure he is referred for local mental health treatment.  I certify that inpatient services furnished can reasonably be expected to improve the patient's condition.   Greg Berthold, MD 11/30/2018, 1:27 PM

## 2018-11-30 NOTE — Plan of Care (Signed)
Patient sleep through the night and rest full and only require routine visual checks, denies any SI/HI/AVH no distress.   Problem: Education: Goal: Knowledge of [facility] Education information/materials will improve Outcome: Progressing Goal: Emotional status will improve Outcome: Progressing Goal: Mental status will improve Outcome: Progressing Goal: Verbalization of understanding the information provided will improve Outcome: Progressing   Problem: Activity: Goal: Interest or engagement in activities will improve Outcome: Progressing Goal: Sleeping patterns will improve Outcome: Progressing   Problem: Coping: Goal: Ability to verbalize frustrations and anger appropriately will improve Outcome: Progressing Goal: Ability to demonstrate self-control will improve Outcome: Progressing   Problem: Health Behavior/Discharge Planning: Goal: Identification of resources available to assist in meeting health care needs will improve Outcome: Progressing Goal: Compliance with treatment plan for underlying cause of condition will improve Outcome: Progressing   Problem: Physical Regulation: Goal: Ability to maintain clinical measurements within normal limits will improve Outcome: Progressing   Problem: Safety: Goal: Periods of time without injury will increase Outcome: Progressing

## 2018-11-30 NOTE — Progress Notes (Signed)
Admission Note  D: Pt appeared anxious.  Pt  denies SI / AVH at this time.  28 year old  black male.Thought process is clear, non-threatening at the time of admission. Presenting  with normal behaviors. Patient states that he woke from a dream that he had fallen in a dark black hole. Upon waking from his sleep he felt like he couldn't escape his dream and proceeded to cut his right wrist. Superficial cuts covered with a band-aid.  Patient's mother brought patient to hospital on Tuesday night for self-injuring behavior. Patient states that he has been off of his medications for about a month. Patient is positive for benzos, THC, and Cocaine. Patient repeatedly states that it was a mistake, and that he does not want to harm himself, or need to be admitted.     A: Pt admitted to unit per protocol, skin assessment and search done  no contraband found.  Pt  educated on therapeutic milieu rules. Pt was introduced to milieu by nursing staff.    R: Pt was receptive to education about the milieu .  15 min safety checks started. Probation officer offered support
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
## 2018-11-30 NOTE — Plan of Care (Signed)
Patient stayed in bed most of the shift states "I am tired."Patient stated that he feels good after he had a good sleep.Denies SI,HI and AVH.Patient is pleasant and cooperative on approach.Compliant with medications.Appetite and energy level good.Support and encouragement given.

## 2018-11-30 NOTE — BHH Suicide Risk Assessment (Signed)
[facility] INPATIENT:  Family/Significant Other Suicide Prevention Education  Suicide Prevention Education:  Patient Refusal for Family/Significant Other Suicide Prevention Education: The patient [name] has refused to provide written consent for family/significant other to be provided Family/Significant Other Suicide Prevention Education during admission and/or prior to discharge.  Physician notified.  SPE completed with pt, as pt refused to consent to family contact. SPI pamphlet provided to pt and pt was encouraged to share information with support network, ask questions, and talk about any concerns relating to SPE. Pt denies access to guns/firearms and verbalized understanding of information provided. Mobile Crisis information also provided to pt.  Pt did give permission for CSW to speak with his mother [name] and reported her phone number was in his chart. CSW unable to find a number for [name] in pts chart so SPE completed with pt.  [place] MSW LCSW 11/30/2018, 9:03 AM

## 2018-11-30 NOTE — BHH Group Notes (Signed)
Emotional Regulation 11/30/2018 1PM  Type of Therapy/Topic:  Group Therapy:  Emotion Regulation  Participation Level:  Minimal   Description of Group:   The purpose of this group is to assist patients in learning to regulate negative emotions and experience positive emotions. Patients will be guided to discuss ways in which they have been vulnerable to their negative emotions. These vulnerabilities will be juxtaposed with experiences of positive emotions or situations, and patients will be challenged to use positive emotions to combat negative ones. Special emphasis will be placed on coping with negative emotions in conflict situations, and patients will process healthy conflict resolution skills.  Therapeutic Goals: 1. Patient will identify two positive emotions or experiences to reflect on in order to balance out negative emotions 2. Patient will label two or more emotions that they find the most difficult to experience 3. Patient will demonstrate positive conflict resolution skills through discussion and/or role plays  Summary of Patient Progress: Minimal participation, pt came to group late and left group early and did not return.       Therapeutic Modalities:   Cognitive Behavioral Therapy Feelings Identification Dialectical Behavioral Therapy   Yvette Rack, LCSW 11/30/2018 2:05 PM

## 2018-11-30 NOTE — Progress Notes (Signed)
Recreation Therapy Notes   Date: 11/30/2018  Time: 9:30 am   Location: Craft room   Behavioral response: N/A   Intervention Topic: Necessities    Discussion/Intervention: Patient did not attend group.   Clinical Observations/Feedback:  Patient did not attend group.   Marcene Laskowski LRT/CTRS        Chun Sellen 11/30/2018 10:53 AM

## 2018-12-01 MED ORDER — BENZTROPINE MESYLATE 0.5 MG PO TABS: 0.5000 mg | ORAL_TABLET | Freq: Every day | ORAL | 1 refills | Status: DC

## 2018-12-01 MED ORDER — ARIPIPRAZOLE ER 400 MG IM SRER: 400.0000 mg | INTRAMUSCULAR | 1 refills | Status: DC

## 2018-12-01 MED ORDER — DIVALPROEX SODIUM ER 500 MG PO TB24: 500.0000 mg | ORAL_TABLET | Freq: Every day | ORAL | 1 refills | Status: DC

## 2018-12-01 MED ORDER — ARIPIPRAZOLE 10 MG PO TABS: 10.0000 mg | ORAL_TABLET | Freq: Every day | ORAL | 0 refills | Status: DC

## 2018-12-01 NOTE — Progress Notes (Addendum)
Pt denies SI, HI and AVH. Pt was educated on dc plan and verbalizes understanding. Pt received transition packet, prescriptions, bus pass and belongings. Collier Bullock RN

## 2018-12-01 NOTE — BHH Suicide Risk Assessment (Signed)
[facility] Discharge Suicide Risk Assessment   Principal Problem: Schizoaffective disorder [facility]) Discharge Diagnoses: Principal Problem:   Schizoaffective disorder ([place]) Active Problems:   Cannabis abuse   Cocaine abuse with cocaine-induced mood disorder (HCC)   Suicidal ideation   Self-inflicted laceration of right wrist (HCC)   Total Time spent with patient: 30 minutes  Musculoskeletal: Strength & Muscle Tone: within normal limits Gait & Station: normal Patient leans: N/A  Psychiatric Specialty Exam: Review of Systems  Constitutional: Negative.   HENT: Negative.   Eyes: Negative.   Respiratory: Negative.   Cardiovascular: Negative.   Gastrointestinal: Negative.   Musculoskeletal: Negative.   Skin: Negative.   Neurological: Negative.   Psychiatric/Behavioral: Negative.     Blood pressure 112/72, pulse 65, temperature 97.9 F (36.6 C), temperature source Oral, resp. rate 18, SpO2 99 %.There is no height or weight on file to calculate BMI.  General Appearance: Casual  Eye Contact::  Good  Speech:  Clear and [id number]  Volume:  Normal  Mood:  Euthymic  Affect:  Congruent  Thought Process:  Goal Directed  Orientation:  Full (Time, Place, and Person)  Thought Content:  Logical  Suicidal Thoughts:  No  Homicidal Thoughts:  No  Memory:  Immediate;   Fair Recent;   Fair Remote;   Fair  Judgement:  Fair  Insight:  Fair  Psychomotor Activity:  Normal  Concentration:  Fair  Recall:  [employer] [place]  Language: Fair  Akathisia:  No  Handed:  Right  AIMS (if indicated):     Assets:  Desire for Improvement Housing Physical Health Resilience Social Support  Sleep:  Number of Hours: 8.5  Cognition: WNL  ADL's:  Intact   Mental Status Per Nursing Assessment::   On Admission:  Self-harm behaviors  Demographic Factors:  Male  Loss Factors: Decrease in vocational status  Historical Factors: Impulsivity  Risk Reduction Factors:   Living with another  person, especially a relative, Positive social support and Positive therapeutic relationship  Continued Clinical Symptoms:  Alcohol/Substance Abuse/Dependencies Schizophrenia:   Less than 43 years old  Cognitive Features That Contribute To Risk:  None    Suicide Risk:  Minimal: No identifiable suicidal ideation.  Patients presenting with no risk factors but with morbid ruminations; may be classified as minimal risk based on the severity of the depressive symptoms    Plan Of Care/Follow-up recommendations:  Activity:  Activity as tolerated Diet:  Regular diet Other:  Follow-up with [facility].  Alethia Berthold, MD 12/01/2018, 9:54 AM

## 2018-12-01 NOTE — Discharge Summary (Signed)
Physician Discharge Summary Note  Patient:  [name] is an 28 y.o., male MRN:  [id number] DOB:  1991-01-11 Patient phone:  [phone] (home)  Patient address:   [address] [place] [address],  Total Time spent with patient: 30 minutes  Date of Admission:  11/29/2018 Date of Discharge: December 01, 2018  Reason for Admission: Patient was admitted through the emergency room where he presented agitated tearful talking about psychotic symptoms and suicidal ideation.  Had been off his medicine and abusing substances.  Principal Problem: Schizoaffective disorder [facility]) Discharge Diagnoses: Principal Problem:   Schizoaffective disorder ([place]) Active Problems:   Cannabis abuse   Cocaine abuse with cocaine-induced mood disorder (HCC)   Suicidal ideation   Self-inflicted laceration of right wrist (HCC)   Past Psychiatric History: History of schizophrenia and substance abuse.  Has had prior hospitalizations and emergency room presentations.  Has had a history of noncompliance leading to worsening symptoms although he does pretty well when he is on his medicine and not abusing stimulants.  Past Medical History:  Past Medical History:  Diagnosis Date  . Schizophrenia ([place])    History reviewed. No pertinent surgical history. Family History: History reviewed. No pertinent family history. Family Psychiatric  History: See previous Social History:  Social History   Substance and Sexual Activity  Alcohol Use Yes     Social History   Substance and Sexual Activity  Drug Use Yes  . Types: Cocaine   Comment: 11/28/2018    Social History   Socioeconomic History  . Marital status: Single    Spouse name: Not on file  . Number of children: Not on file  . Years of education: Not on file  . Highest education level: Not on file  Occupational History  . Not on file  Social Needs  . Financial resource strain: Not on file  . Food insecurity    Worry: Not on file   Inability: Not on file  . Transportation needs    Medical: Not on file    Non-medical: Not on file  Tobacco Use  . Smoking status: Current Every Day Smoker  . Smokeless tobacco: Never Used  Substance and Sexual Activity  . Alcohol use: Yes  . Drug use: Yes    Types: Cocaine    Comment: 11/28/2018  . Sexual activity: Not on file  Lifestyle  . Physical activity    Days per week: Not on file    Minutes per session: Not on file  . Stress: Not on file  Relationships  . Social Herbalist on phone: Not on file    Gets together: Not on file    Attends religious service: Not on file    Active member of club or organization: Not on file    Attends meetings of clubs or organizations: Not on file    Relationship status: Not on file  Other Topics Concern  . Not on file  Social History Narrative  . Not on file    Hospital Course: Patient admitted to the psychiatric ward.  He was given the Abilify maintain a 400 mg injection.  Also continued on 10 mg Abilify daily and Depakote 500 mg/day.  Patient slept well and had no behavior problems on the unit.  He has not shown any suicidal or homicidal behavior.  He participated in treatment team appropriately and is upbeat in his mood and affect.  He met with the representative from [place] and is enthusiastically agreeable to continued  follow-up with them.  He received counseling about the dangers of continued substance abuse.  At this point no longer meets commitment criteria and will be discharged.  Physical Findings: AIMS:  , ,  ,  ,    CIWA:    COWS:     Musculoskeletal: Strength & Muscle Tone: within normal limits Gait & Station: normal Patient leans: N/A  Psychiatric Specialty Exam: Physical Exam  Nursing note and vitals reviewed. Constitutional: He appears well-developed and well-nourished.  HENT:  Head: Normocephalic and atraumatic.  Eyes: Pupils are equal, round, and reactive to light. Conjunctivae are normal.  Neck: Normal  range of motion.  Cardiovascular: Regular rhythm and normal heart sounds.  Respiratory: Effort normal. No respiratory distress.  GI: Soft.  Musculoskeletal: Normal range of motion.  Neurological: He is alert.  Skin: Skin is warm and dry.  Psychiatric: He has a normal mood and affect. His behavior is normal. Judgment and thought content normal.    Review of Systems  Constitutional: Negative.   HENT: Negative.   Eyes: Negative.   Respiratory: Negative.   Cardiovascular: Negative.   Gastrointestinal: Negative.   Musculoskeletal: Negative.   Skin: Negative.   Neurological: Negative.   Psychiatric/Behavioral: Negative.     Blood pressure 112/72, pulse 65, temperature 97.9 F (36.6 C), temperature source Oral, resp. rate 18, SpO2 99 %.There is no height or weight on file to calculate BMI.  General Appearance: Casual  Eye Contact:  Good  Speech:  Clear and Coherent  Volume:  Normal  Mood:  Euthymic  Affect:  Congruent  Thought Process:  Goal Directed  Orientation:  Full (Time, Place, and Person)  Thought Content:  Logical  Suicidal Thoughts:  No  Homicidal Thoughts:  No  Memory:  Immediate;   Fair Recent;   Fair Remote;   Fair  Judgement:  Fair  Insight:  Fair  Psychomotor Activity:  Normal  Concentration:  Concentration: Fair  Recall:  [place] of Knowledge:  Fair  Language:  Fair  Akathisia:  No  Handed:  Right  AIMS (if indicated):     Assets:  Desire for Improvement Housing Physical Health Social Support  ADL's:  Intact  Cognition:  WNL  Sleep:  Number of Hours: 8.5        Has this patient used any form of tobacco in the last 30 days? (Cigarettes, Smokeless Tobacco, Cigars, and/or Pipes) Yes, No  Blood Alcohol level:  Lab Results  Component Value Date   ETH <10 11/29/2018   ETH 39 (H) 21/11/7354    Metabolic Disorder Labs:  No results found for: HGBA1C, MPG No results found for: PROLACTIN Lab Results  Component Value Date   CHOL 121 11/30/2018    TRIG 102 11/30/2018   HDL 39 (L) 11/30/2018   CHOLHDL 3.1 11/30/2018   VLDL 20 11/30/2018   [place] 62 11/30/2018    See Psychiatric Specialty Exam and Suicide Risk Assessment completed by Attending Physician prior to discharge.  Discharge destination:  Home  Is patient on multiple antipsychotic therapies at discharge:  No   Has Patient had three or more failed trials of antipsychotic monotherapy by history:  No  Recommended Plan for Multiple Antipsychotic Therapies: NA  Discharge Instructions    Diet - low sodium heart healthy   Complete by: As directed    Increase activity slowly   Complete by: As directed      Allergies as of 12/01/2018      Reactions   Ibuprofen Swelling  Tongue swelling   Risperidone And Related Other (See Comments), Swelling   gynecomastia gynecomastia   Ziprasidone Swelling   Tongue swells   Quetiapine Other (See Comments)   Depression, suicidality, adverse effect: seizures       Medication List    STOP taking these medications   haloperidol 2 MG tablet Commonly known as: HALDOL   haloperidol decanoate 100 MG/ML injection Commonly known as: HALDOL DECANOATE   Multi-Vitamin tablet   traZODone 50 MG tablet Commonly known as: DESYREL     TAKE these medications     Indication  ARIPiprazole 10 MG tablet Commonly known as: ABILIFY Take 1 tablet (10 mg total) by mouth daily.  Indication: Schizophrenia   ARIPiprazole ER 400 MG Srer injection Commonly known as: ABILIFY MAINTENA Inject 2 mLs (400 mg total) into the muscle every 28 (twenty-eight) days. Next dose is due about December 28, 2018 Start taking on: December 28, 2018  Indication: Schizophrenia   benztropine 0.5 MG tablet Commonly known as: COGENTIN Take 1 tablet (0.5 mg total) by mouth daily. What changed: additional instructions  Indication: Extrapyramidal Reaction caused by Medications   divalproex 500 MG 24 hr tablet Commonly known as: DEPAKOTE ER Take 1 tablet (500  mg total) by mouth daily.  Indication: MIXED BIPOLAR AFFECTIVE DISORDER      Follow-up Information    [facility] Follow up.   Why: You are scheduled to speak with Sherrian Divers via zoom on Tuesday, November 24th at 7am. Thank you. Contact information: [place] [address] [phone]           Follow-up recommendations:  Activity:  Activity as tolerated Diet:  Regular diet Other:  Patient is to follow-up with [facility] and has spoken with Mr. Marshell Levan and made arrangements for specific follow-up plans.  He has received specific counseling about the dangers of continued substance abuse.  Comments: Patient expresses insight and understanding of the plan.  No longer meets commitment criteria.  Discharged today.  Signed: Alethia Berthold, MD 12/01/2018, 9:59 AM

## 2018-12-01 NOTE — Tx Team (Signed)
Interdisciplinary Treatment and Diagnostic Plan Update  12/01/2018 Time of Session: 9am [name] MRN: [id number]  Principal Diagnosis: Schizoaffective disorder [facility])  Secondary Diagnoses: Principal Problem:   Schizoaffective disorder ([place]) Active Problems:   Cannabis abuse   Cocaine abuse with cocaine-induced mood disorder ([place])   Suicidal ideation   Self-inflicted laceration of right wrist (HCC)   Current Medications:  Current Facility-Administered Medications  Medication Dose Route Frequency Provider Last Rate Last Dose  . acetaminophen (TYLENOL) tablet 650 mg  650 mg Oral Q6H PRN Cristofano, Dorene Ar, MD      . alum & mag hydroxide-simeth (MAALOX/MYLANTA) 200-200-20 MG/5ML suspension 30 mL  30 mL Oral Q4H PRN Cristofano, Paul A, MD      . ARIPiprazole (ABILIFY) tablet 10 mg  10 mg Oral Daily Clapacs, Madie Reno, MD   10 mg at 11/30/18 1527  . ARIPiprazole ER (ABILIFY MAINTENA) injection 400 mg  400 mg Intramuscular Q28 days Clapacs, Madie Reno, MD   400 mg at 11/30/18 1629  . benztropine (COGENTIN) tablet 0.5 mg  0.5 mg Oral Daily Cristofano, Paul A, MD   0.5 mg at 11/30/18 0800  . divalproex (DEPAKOTE ER) 24 hr tablet 500 mg  500 mg Oral Daily Cristofano, Paul A, MD   500 mg at 11/30/18 0800  . magnesium hydroxide (MILK OF MAGNESIA) suspension 30 mL  30 mL Oral Daily PRN Cristofano, Dorene Ar, MD      . multivitamin with minerals tablet 1 tablet  1 tablet Oral Daily Cristofano, Dorene Ar, MD   1 tablet at 11/30/18 0800   PTA Medications: Medications Prior to Admission  Medication Sig Dispense Refill Last Dose  . benztropine (COGENTIN) 0.5 MG tablet Take 0.5 mg by mouth daily. At 3 pm     . divalproex (DEPAKOTE ER) 500 MG 24 hr tablet Take 500 mg by mouth daily.     . haloperidol (HALDOL) 2 MG tablet Take 2 mg by mouth 2 (two) times daily.     . haloperidol decanoate (HALDOL DECANOATE) 100 MG/ML injection Inject 1 mL into the muscle every 28 (twenty-eight) days.     . Multiple Vitamin  (MULTI-VITAMIN) tablet Take 1 tablet by mouth daily.     . traZODone (DESYREL) 50 MG tablet Take 50 mg by mouth at bedtime.       Patient Stressors:    Patient Strengths:    Treatment Modalities: Medication Management, Group therapy, Case management,  1 to 1 session with clinician, Psychoeducation, Recreational therapy.   Physician Treatment Plan for Primary Diagnosis: Schizoaffective disorder ([place]) Long Term Goal(s): Improvement in symptoms so as ready for discharge Improvement in symptoms so as ready for discharge   Short Term Goals: Ability to disclose and discuss suicidal ideas Ability to demonstrate self-control will improve Ability to identify and develop effective coping behaviors will improve Ability to maintain clinical measurements within normal limits will improve Compliance with prescribed medications will improve  Medication Management: Evaluate patient's response, side effects, and tolerance of medication regimen.  Therapeutic Interventions: 1 to 1 sessions, Unit Group sessions and Medication administration.  Evaluation of Outcomes: Progressing  Physician Treatment Plan for Secondary Diagnosis: Principal Problem:   Schizoaffective disorder ([place]) Active Problems:   Cannabis abuse   Cocaine abuse with cocaine-induced mood disorder (HCC)   Suicidal ideation   Self-inflicted laceration of right wrist (HCC)  Long Term Goal(s): Improvement in symptoms so as ready for discharge Improvement in symptoms so as ready for discharge   Short Term Goals:  Ability to disclose and discuss suicidal ideas Ability to demonstrate self-control will improve Ability to identify and develop effective coping behaviors will improve Ability to maintain clinical measurements within normal limits will improve Compliance with prescribed medications will improve     Medication Management: Evaluate patient's response, side effects, and tolerance of medication regimen.  Therapeutic  Interventions: 1 to 1 sessions, Unit Group sessions and Medication administration.  Evaluation of Outcomes: Progressing   RN Treatment Plan for Primary Diagnosis: Schizoaffective disorder (HCC) Long Term Goal(s): Knowledge of disease and therapeutic regimen to maintain health will improve  Short Term Goals: Ability to participate in decision making will improve, Ability to verbalize feelings will improve, Ability to disclose and discuss suicidal ideas, Ability to identify and develop effective coping behaviors will improve and Compliance with prescribed medications will improve  Medication Management: RN will administer medications as ordered by provider, will assess and evaluate patient's response and provide education to patient for prescribed medication. RN will report any adverse and/or side effects to prescribing provider.  Therapeutic Interventions: 1 on 1 counseling sessions, Psychoeducation, Medication administration, Evaluate responses to treatment, Monitor vital signs and CBGs as ordered, Perform/monitor CIWA, COWS, AIMS and Fall Risk screenings as ordered, Perform wound care treatments as ordered.  Evaluation of Outcomes: Progressing   LCSW Treatment Plan for Primary Diagnosis: Schizoaffective disorder (HCC) Long Term Goal(s): Safe transition to appropriate next level of care at discharge, Engage patient in therapeutic group addressing interpersonal concerns.  Short Term Goals: Engage patient in aftercare planning with referrals and resources  Therapeutic Interventions: Assess for all discharge needs, 1 to 1 time with Social worker, Explore available resources and support systems, Assess for adequacy in community support network, Educate family and significant other(s) on suicide prevention, Complete Psychosocial Assessment, Interpersonal group therapy.  Evaluation of Outcomes: Progressing   Progress in Treatment: Attending groups: Yes. Participating in groups: Yes. Taking  medication as prescribed: Yes. Toleration medication: Yes. Family/Significant other contact made: Yes, individual(s) contacted:  pt declined Patient understands diagnosis: Yes. Discussing patient identified problems/goals with staff: Yes. Medical problems stabilized or resolved: Yes. Denies suicidal/homicidal ideation: Yes. Issues/concerns per patient self-inventory: No. Other: NA  New problem(s) identified: No, Describe:  none reported  New Short Term/Long Term Goal(s):attend outpatient treatment, take medication as prescribed and develop healthy coping methods  Patient Goals:  "I was trying to leave today"  Discharge Plan or Barriers: Pt will return home and follow up with [facility].  Reason for Continuation of Hospitalization: Medication stabilization  Estimated Length of Stay:1-7 days  Attendees: Patient:[name] 12/01/2018 9:35 AM  Physician: Mordecai Rasmussen 12/01/2018 9:35 AM  Nursing: Hulan Amato 12/01/2018 9:35 AM  RN Care Manager: 12/01/2018 9:35 AM  Social Worker: Lowella Dandy Jeananne Rama Moton 12/01/2018 9:35 AM  Recreational Therapist:  12/01/2018 9:35 AM  Other:  12/01/2018 9:35 AM  Other:  12/01/2018 9:35 AM  Other: 12/01/2018 9:35 AM    Scribe for Treatment Team: Suzan Slick, LCSW 12/01/2018 9:35 AM

## 2018-12-01 NOTE — Progress Notes (Signed)
  [facility] Adult Case Management Discharge Plan :  Will you be returning to the same living situation after discharge:  Yes,  lives with relatives At discharge, do you have transportation home?: Yes,  bus pass Do you have the ability to pay for your medications: Yes,  medicare  Release of information consent forms completed and in the chart;  Patient's signature needed at discharge.  Patient to Follow up at: Follow-up Information    [facility] Follow up.   Why: You are scheduled to speak with Sherrian Divers via zoom on Tuesday, November 24th at 7am. Thank you. Contact information: [place] [address] [phone]           Next level of care provider has access to [place] and Suicide Prevention discussed: Yes,  with pt; declined family contact     Has patient been referred to the Quitline?: N/A patient is not a smoker  Patient has been referred for addiction treatment: N/A  Yvette Rack, LCSW 12/01/2018, 9:57 AM

## 2018-12-30 ENCOUNTER — Emergency Department
Admission: EM | Admit: 2018-12-30 | Discharge: 2018-12-31 | Disposition: A | Discharge status: Home / Self Care | Payer: Medicare Other | Attending: Student in an Organized Health Care Education/Training Program | Admitting: Student in an Organized Health Care Education/Training Program

## 2018-12-30 DIAGNOSIS — F1494 Cocaine use, unspecified with cocaine-induced mood disorder: Secondary | ICD-10-CM | POA: Diagnosis present

## 2018-12-30 DIAGNOSIS — R451 Restlessness and agitation: Secondary | ICD-10-CM | POA: Diagnosis not present

## 2018-12-30 DIAGNOSIS — F209 Schizophrenia, unspecified: Secondary | ICD-10-CM | POA: Diagnosis not present

## 2018-12-30 DIAGNOSIS — R4689 Other symptoms and signs involving appearance and behavior: Secondary | ICD-10-CM | POA: Diagnosis present

## 2018-12-30 DIAGNOSIS — F1414 Cocaine abuse with cocaine-induced mood disorder: Secondary | ICD-10-CM | POA: Diagnosis present

## 2018-12-30 DIAGNOSIS — Z79899 Other long term (current) drug therapy: Secondary | ICD-10-CM | POA: Diagnosis not present

## 2018-12-30 DIAGNOSIS — F17210 Nicotine dependence, cigarettes, uncomplicated: Secondary | ICD-10-CM | POA: Diagnosis not present

## 2018-12-30 MED ORDER — HALOPERIDOL LACTATE 5 MG/ML IJ SOLN: 5.0000 mg | Freq: Once | INTRAMUSCULAR | Status: DC

## 2018-12-30 MED ORDER — DIPHENHYDRAMINE HCL 50 MG/ML IJ SOLN
INTRAMUSCULAR | Status: AC
  Filled 2018-12-30: qty 1

## 2018-12-30 MED ORDER — HALOPERIDOL LACTATE 5 MG/ML IJ SOLN
INTRAMUSCULAR | Status: AC
  Filled 2018-12-30: qty 1

## 2018-12-30 MED ORDER — LORAZEPAM 2 MG PO TABS
2.0000 mg | ORAL_TABLET | Freq: Once | ORAL | Status: AC
  Administered 2018-12-30: 2 mg via ORAL
  Filled 2018-12-30: qty 1

## 2018-12-30 MED ORDER — DIPHENHYDRAMINE HCL 25 MG PO CAPS
50.0000 mg | ORAL_CAPSULE | Freq: Once | ORAL | Status: AC
  Administered 2018-12-30: 50 mg via ORAL
  Filled 2018-12-30: qty 2

## 2018-12-30 MED ORDER — LORAZEPAM 2 MG/ML IJ SOLN
INTRAMUSCULAR | Status: AC
  Filled 2018-12-30: qty 1

## 2018-12-30 MED ORDER — DIPHENHYDRAMINE HCL 50 MG/ML IJ SOLN: 50.0000 mg | Freq: Once | INTRAMUSCULAR | Status: DC

## 2018-12-30 MED ORDER — HALOPERIDOL 5 MG PO TABS
5.0000 mg | ORAL_TABLET | Freq: Once | ORAL | Status: AC
  Administered 2018-12-30: 5 mg via ORAL
  Filled 2018-12-30: qty 1

## 2018-12-30 MED ORDER — PROPOFOL 1000 MG/100ML IV EMUL: 5.0000 ug/kg/min | INTRAVENOUS | Status: DC

## 2018-12-30 MED ORDER — LORAZEPAM 2 MG/ML IJ SOLN: 2.0000 mg | Freq: Once | INTRAMUSCULAR | Status: DC
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
## 2018-12-30 NOTE — ED Notes (Addendum)
Pt dressed out by this RN and NT. Belongings include: Black shoes Black zip up sweat shirt Tan pants with belt Black pants Black shorts Black boxers White long sleeved shirt Black short sleeve shirt Dark gray short sleeved shirt  3 bags total

## 2018-12-30 NOTE — BH Assessment (Signed)
Pt is currently sedated and unable to assess.Writer will check back later to reassess.
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
## 2018-12-30 NOTE — ED Notes (Signed)
Pt. Currently sleeping in bed 20A.

## 2018-12-30 NOTE — ED Notes (Signed)
Pt cooperative dressing out and obtaining lab work.  PO medications given as ordered to help keep patient calm.  Pt resting at this time. Maintained on 15 minute checks and observation by security camera for safety.

## 2018-12-30 NOTE — ED Notes (Signed)
IVC PENDING  CONSULT ?

## 2018-12-31 DIAGNOSIS — F1414 Cocaine abuse with cocaine-induced mood disorder: Secondary | ICD-10-CM
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
## 2018-12-31 NOTE — ED Notes (Signed)
Pt. Asked "are you hurting anywhere".  Pt. Indicated "he was broken hearted".  Pt. Calm and cooperative, but tired.  Pt. Fell asleep after TTS interview and meal tray.

## 2018-12-31 NOTE — BH Assessment (Addendum)
Assessment Note  [name] is an 28 y.o. male. Who presents with a history as listed below. Pt presents under IVC. Pt was trespassing at [facility] and attempted to grab an officer's gun. He stated that he used Cocaine PTA. Pt awakened to voice and was agreeable to complete assessment. Pt was difficult to understand much of the time as he was extremely drowsy and was prompted often to respond to questioning. Pt reports multiple depressive sx including insomnia, irritability and fatigue. He shares that he is unable to recall what took place at the [employer] on today. Pt has had prior hospitalizations and emergency room presentations.  Has had a history of noncompliance leading to worsening symptoms. Pt states that he lost his medication approximately three weeks ago. Pt is endorses hallucinating (Endorses unspecified audiovisual hallucinations, which appear to be longstanding, endorses audio hallucinations of " people telling me to do stuff like knock on peoples doors or hurt myself." Pt presenting with impaired insight, judgment and impulse control, further evaluation is recommended.  The patient does meet admission criteria at this time. This was explained to the pt, who voiced understanding.   Diagnosis: Schizophrenia    Past Medical History:  Past Medical History:  Diagnosis Date  . Schizophrenia (HCC)     No past surgical history on file.  Family History: No family history on file.  Social History:  reports that he has been smoking. He has never used smokeless tobacco. He reports current alcohol use. He reports current drug use. Drug: Cocaine.  Additional Social History:  Alcohol / Drug Use Pain Medications: See PTA Prescriptions: See PTA Over the Counter: See PTA History of alcohol / drug use?: Yes Longest period of sobriety (when/how long): Unable to quantify Negative Consequences of Use: Personal relationships, Legal, Work / School Substance #1 Name of Substance 1:  Cocaine 1 - Age of First Use: 26 1 - Frequency: Daily 1 - Duration: 2 Years 1 - Last Use / Amount: PTA  CIWA: CIWA-Ar BP: (!) 104/56 Pulse Rate: (!) 102 COWS:    Allergies:  Allergies  Allergen Reactions  . Ibuprofen Swelling    Tongue swelling  . Risperidone And Related Other (See Comments) and Swelling    gynecomastia gynecomastia   . Ziprasidone Swelling    Tongue swells  . Quetiapine Other (See Comments)    Depression, suicidality, adverse effect: seizures     Home Medications: (Not in a hospital admission)   OB/GYN Status:  No LMP for male patient.  General Assessment Data Location of Assessment: [facility] ED TTS Assessment: In system Is this a Tele or Face-to-Face Assessment?: Tele Assessment Is this an Initial Assessment or a Re-assessment for this encounter?: Initial Assessment Patient Accompanied by:: N/A Language Other than English: No Living Arrangements: Homeless/Shelter What gender do you identify as?: Male Marital status: Single Pregnancy Status: No Living Arrangements: Other (Comment) Can pt return to current living arrangement?: Yes Admission Status: Involuntary Petitioner: Other Is patient capable of signing voluntary admission?: No Referral Source: Other Insurance type: Medicare   Medical Screening Exam [facility] Walk-in ONLY) Medical Exam completed: Yes  Crisis Care Plan Living Arrangements: Other (Comment) Legal Guardian: Other: Name of Psychiatrist: None Name of Therapist: None  Education Status Is patient currently in school?: No Is the patient employed, unemployed or receiving disability?: Unemployed  Risk to self with the past 6 months Suicidal Ideation: Yes-Currently Present Has patient been a risk to self within the past 6 months prior to admission? :  Yes Suicidal Intent: No Has patient had any suicidal intent within the past 6 months prior to admission? : No Is patient at risk for suicide?: Yes Suicidal Plan?: No Has patient had  any suicidal plan within the past 6 months prior to admission? : No What has been your use of drugs/alcohol within the last 12 months?: Cocaine use Previous Attempts/Gestures: Yes How many times?: (Multiple ) Triggers for Past Attempts: None known Intentional Self Injurious Behavior: None Family Suicide History: No Recent stressful life event(s): Financial Problems, Conflict (Comment) Persecutory voices/beliefs?: Yes Depression: Yes Depression Symptoms: Despondent, Feeling angry/irritable, Loss of interest in usual pleasures, Insomnia Substance abuse history and/or treatment for substance abuse?: Yes Suicide prevention information given to non-admitted patients: Not applicable  Risk to Others within the past 6 months Homicidal Ideation: No Does patient have any lifetime risk of violence toward others beyond the six months prior to admission? : No Thoughts of Harm to Others: No Current Homicidal Intent: No Current Homicidal Plan: No Access to Homicidal Means: No History of harm to others?: No Assessment of Violence: None Noted Does patient have access to weapons?: No Criminal Charges Pending?: No Describe Pending Criminal Charges: no Does patient have a court date: No Is patient on probation?: No  Psychosis Hallucinations: Auditory, Visual Delusions: None noted  Mental Status Report Appearance/Hygiene: In scrubs, Unremarkable Eye Contact: Fair Motor Activity: Freedom of movement Speech: Logical/coherent, Tangential Level of Consciousness: Drowsy Mood: Depressed Affect: Flat Anxiety Level: Minimal Thought Processes: Thought Blocking Judgement: Impaired Orientation: Appropriate for developmental age Obsessive Compulsive Thoughts/Behaviors: None  Cognitive Functioning Concentration: Fair Memory: Recent Intact, Remote Intact Is patient IDD: No Insight: Poor Impulse Control: Fair Appetite: Fair Have you had any weight changes? : No Change Sleep: Decreased Vegetative  Symptoms: Unable to Assess  ADLScreening [facility] Assessment Services) Patient's cognitive ability adequate to safely complete daily activities?: Yes Patient able to express need for assistance with ADLs?: Yes Independently performs ADLs?: Yes (appropriate for developmental age)  Prior Inpatient Therapy Prior Inpatient Therapy: Yes Prior Therapy Dates: "It has been a minute" Prior Therapy Facilty/Provider(s): Unsure which hospitals Reason for Treatment: Schizophrenia, Depression, SI  Prior Outpatient Therapy Prior Outpatient Therapy: No Does patient have an ACCT team?: No Does patient have Intensive In-House Services?  : No Does patient have [facility] services? : No Does patient have P4CC services?: No  ADL Screening (condition at time of admission) Patient's cognitive ability adequate to safely complete daily activities?: Yes Patient able to express need for assistance with ADLs?: Yes Independently performs ADLs?: Yes (appropriate for developmental age)       Abuse/Neglect Assessment (Assessment to be complete while patient is alone) Physical Abuse: Denies Verbal Abuse: Denies Sexual Abuse: Denies Exploitation of patient/patient's resources: Denies Self-Neglect: Denies Values / Beliefs Cultural Requests During Hospitalization: None Spiritual Requests During Hospitalization: None Consults Spiritual Care Consult Needed: No Transition of Care Team Consult Needed: No            Disposition:  Disposition Initial Assessment Completed for this Encounter: Yes  On Site Evaluation by:   Reviewed with Physician:    Laretta Alstrom 12/31/2018 2:28 AM

## 2018-12-31 NOTE — ED Notes (Signed)
Pt refused to sign for d/c paperwork and vitals. Taken to jail via [employer] in handcuffs.

## 2018-12-31 NOTE — ED Notes (Signed)
Psych NP at bedside

## 2018-12-31 NOTE — BH Assessment (Signed)
This Probation officer presented to patient face to face for reassessment, patient reports that he is currently staying with his mother and sibling. Per Psyc NP patient will be discharged.

## 2018-12-31 NOTE — ED Notes (Signed)
Pt being reviewed for possible admission to [facility]. H&P and Assessment has been provided to the BH Unit for the charge nurse to review and provide bed assignment.        

## 2018-12-31 NOTE — ED Notes (Signed)
TTS machine placed in patients room to talk to TTS.  Pt. Talking to TTS.

## 2018-12-31 NOTE — ED Provider Notes (Signed)
-----------------------------------------   5:55 AM on 12/31/2018 -----------------------------------------   Blood pressure (!) 144/86, pulse 88, temperature 98.1 F (36.7 C), temperature source Oral, resp. rate 18, SpO2 99 %.  The patient is sleeping at this time.  Psychiatric NP was unable to awaken patient to interview him.  Psychiatry will reattempt later this morning.  Disposition per behavioral medicine.   Paulette Blanch, MD 12/31/18 [phone]

## 2018-12-31 NOTE — ED Notes (Signed)
Pt. Woke up requested and was given meal tray and drink.

## 2018-12-31 NOTE — ED Notes (Signed)
Meal tray provided. Pt to be d/c per psych NP. Pending d/c papers.

## 2018-12-31 NOTE — ED Notes (Signed)
Pt given blue paper scrubs to change. Will be d/c into [employer] custody.

## 2018-12-31 NOTE — Discharge Instructions (Signed)
[facility] (Mental Health & Substance Use Services) & [facility]  Mental health service in [place] Address: [address], [place], [place] [address] Hours:  Closed ? Opens 8AM Mon Phone: [phone] 

## 2019-01-27 ENCOUNTER — Emergency Department
Admission: EM | Admit: 2019-01-27 | Discharge: 2019-01-28 | Disposition: A | Discharge status: Home / Self Care | Payer: Medicare Other | Attending: Emergency Medicine | Admitting: Emergency Medicine

## 2019-01-27 DIAGNOSIS — F209 Schizophrenia, unspecified: Secondary | ICD-10-CM | POA: Diagnosis not present

## 2019-01-27 DIAGNOSIS — F250 Schizoaffective disorder, bipolar type: Secondary | ICD-10-CM | POA: Diagnosis not present

## 2019-01-27 DIAGNOSIS — F1494 Cocaine use, unspecified with cocaine-induced mood disorder: Secondary | ICD-10-CM | POA: Diagnosis not present

## 2019-01-27 DIAGNOSIS — F1414 Cocaine abuse with cocaine-induced mood disorder: Secondary | ICD-10-CM | POA: Diagnosis present

## 2019-01-27 DIAGNOSIS — Z79899 Other long term (current) drug therapy: Secondary | ICD-10-CM | POA: Insufficient documentation

## 2019-01-27 DIAGNOSIS — R45851 Suicidal ideations: Secondary | ICD-10-CM | POA: Diagnosis not present

## 2019-01-27 DIAGNOSIS — F17200 Nicotine dependence, unspecified, uncomplicated: Secondary | ICD-10-CM | POA: Insufficient documentation

## 2019-01-27 DIAGNOSIS — Z20822 Contact with and (suspected) exposure to covid-19: Secondary | ICD-10-CM | POA: Insufficient documentation

## 2019-01-27 LAB — COMPREHENSIVE METABOLIC PANEL
ALT: 19 U/L (ref 0–44)
AST: 30 U/L (ref 15–41)
Albumin: 4.3 g/dL (ref 3.5–5.0)
Alkaline Phosphatase: 68 U/L (ref 38–126)
Anion gap: 13 (ref 5–15)
BUN: 15 mg/dL (ref 6–20)
CO2: 23 mmol/L (ref 22–32)
Calcium: 9.2 mg/dL (ref 8.9–10.3)
Chloride: 105 mmol/L (ref 98–111)
Creatinine, Ser: 1.14 mg/dL (ref 0.61–1.24)
GFR calc Af Amer: >60 mL/min (ref >60)
GFR calc non Af Amer: >60 mL/min (ref >60)
Glucose, Bld: 79 mg/dL (ref 70–99)
Potassium: 3.7 mmol/L (ref 3.5–5.1)
Sodium: 141 mmol/L (ref 135–145)
Total Bilirubin: 0.7 mg/dL (ref 0.3–1.2)
Total Protein: 7.6 g/dL (ref 6.5–8.1)

## 2019-01-27 LAB — CBC WITH DIFFERENTIAL/PLATELET
Abs Immature Granulocytes: 0.02 10*3/uL (ref 0.00–0.07)
Basophils Absolute: 0 10*3/uL (ref 0.0–0.1)
Basophils Relative: 0 %
Eosinophils Absolute: 0 10*3/uL (ref 0.0–0.5)
Eosinophils Relative: 0 %
HCT: 41.7 % (ref 39.0–52.0)
Hemoglobin: 14 g/dL (ref 13.0–17.0)
Immature Granulocytes: 0 %
Lymphocytes Relative: 36 %
Lymphs Abs: 2.1 10*3/uL (ref 0.7–4.0)
MCH: 29.4 pg (ref 26.0–34.0)
MCHC: 33.6 g/dL (ref 30.0–36.0)
MCV: 87.4 fL (ref 80.0–100.0)
Monocytes Absolute: 0.7 10*3/uL (ref 0.1–1.0)
Monocytes Relative: 12 %
Neutro Abs: 2.9 10*3/uL (ref 1.7–7.7)
Neutrophils Relative %: 52 %
Platelets: 237 10*3/uL (ref 150–400)
RBC: 4.77 MIL/uL (ref 4.22–5.81)
RDW: 12.9 % (ref 11.5–15.5)
WBC: 5.7 10*3/uL (ref 4.0–10.5)
nRBC: 0 % (ref 0.0–0.2)

## 2019-01-27 LAB — RESPIRATORY PANEL BY RT PCR (FLU A&B, COVID)
Influenza A by PCR: NEGATIVE
Influenza B by PCR: NEGATIVE
SARS Coronavirus 2 by RT PCR: NEGATIVE

## 2019-01-27 LAB — ETHANOL: Alcohol, Ethyl (B): <10 mg/dL (ref <10)

## 2019-01-27 LAB — SALICYLATE LEVEL: Salicylate Lvl: <7 mg/dL — ABNORMAL LOW (ref 7.0–30.0)

## 2019-01-27 LAB — ACETAMINOPHEN LEVEL: Acetaminophen (Tylenol), Serum: <10 ug/mL — ABNORMAL LOW (ref 10–30)

## 2019-01-27 NOTE — ED Notes (Signed)
IVC/  PENDING  PLACEMENT 

## 2019-01-27 NOTE — Consult Note (Signed)
[facility] Face-to-Face Psychiatry Consult   Reason for Consult:  Psych evaluation Referring Physician:  Dr. Fuller Plan Patient Identification: [name] MRN:  [id number] Principal Diagnosis: <principal problem not specified> Diagnosis:  Active Problems:   Schizoaffective disorder, bipolar type (HCC)   Total Time spent with patient: 1 hour  Subjective:   [name] is a 29 y.o. male (per TTS) presenting to [facility] ED under IVC given by [facility] due to patient being seen running out into traffic. Per triage not patient denies SI and was cooperative with staff. During assessment patient appeared drowsy and depressed and had difficulty keeping his eyes open and staying awake. When asked why patient was presenting to ED patient reports he does not recall walking into traffic. Patient reports that he currently lives with his mother but that their relationship is back and forth. Patient reports that he has hat not been sleeping "only 2 hours" but reports his appetite is "okay." Patient denies SI/HI/AH/VH. Patient denies being on any current medications and is not linked with a psychiatrist or a therapist for outpatient treatment. Patient reports that he does have a history of inpatient treatment but was unable to recall when he was admitted, he was able to report one reason being that he tried to commit suicide but was unable to recall what he did. Per patient's chart review patient has numerous visits to [facility] ED most recent being on 12/30/18 for Cocaine abuse and 11/29/18 for hallucinations where he was admitted to [facility] for inpatient. 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 Patient was able to report that he currently uses Cocaine, he reports that he used cocaine today "I just did like 2 hits." He reports that he has been using Cocaine since the age of 21.   HPI:  Per EDP: [name] is a 29 y.o. male with schizophrenia who reportedly has been compliant  with his medications who comes in for being out in traffic.  Patient states that he was upset about something that he was trying to hit by car.  He denies any HI or any hallucinations.  Denies any medical concerns.  The start started today, moderate, nothing makes better, nothing makes them worse.   [name], 29 y.o., male patient seen face to face  by this provider; chart reviewed and consulted with Dr. Fuller Plan on 01/27/19.  On evaluation [name] is very Tax adviser and barely answering questions. He does not remember walking into traffic to kill himself. Reportedly  He uses cocaine frequently and does not take his medication as prescribed.    During evaluation [name] is sleeping; he is sleeping and unable to confirm his orientation.  Patient has remained sleep throughout assessment and was not answering questions appropriately.   Past Psychiatric History: unknown  Risk to Self: Suicidal Ideation: No Suicidal Intent: No Is patient at risk for suicide?: No Suicidal Plan?: No What has been your use of drugs/alcohol within the last 12 months?: Cocaine Use Triggers for Past Attempts: None known Intentional Self Injurious Behavior: None Risk to Others: Homicidal Ideation: No Thoughts of Harm to Others: No Current Homicidal Intent: No Current Homicidal Plan: No Access to Homicidal Means: No History of harm to others?: No Assessment of Violence: None Noted Does patient have access to weapons?: No Criminal Charges Pending?: No Describe Pending Criminal Charges: Reports he has to go to court but unsure of what Does patient have a court  date: Yes Prior Inpatient Therapy: Prior Inpatient Therapy: Yes Prior Therapy Dates: "It has been a minute" Prior Therapy Facilty/Provider(s): Unsure which hospitals Reason for Treatment: Schizophrenia, Depression, SI Prior Outpatient Therapy: Prior Outpatient Therapy: No Does patient have an ACCT team?: No Does patient have Intensive  In-House Services?  : No Does patient have [facility] services? : No Does patient have P4CC services?: No  Past Medical History:  Past Medical History:  Diagnosis Date  . Schizophrenia ([place])    No past surgical history on file. Family History: No family history on file. Family Psychiatric  History: unknown Social History:  Social History   Substance and Sexual Activity  Alcohol Use Yes     Social History   Substance and Sexual Activity  Drug Use Yes  . Types: Cocaine   Comment: 11/28/2018    Social History   Socioeconomic History  . Marital status: Single    Spouse name: Not on file  . Number of children: Not on file  . Years of education: Not on file  . Highest education level: Not on file  Occupational History  . Not on file  Tobacco Use  . Smoking status: Current Every Day Smoker  . Smokeless tobacco: Never Used  Substance and Sexual Activity  . Alcohol use: Yes  . Drug use: Yes    Types: Cocaine    Comment: 11/28/2018  . Sexual activity: Not on file  Other Topics Concern  . Not on file  Social History Narrative  . Not on file   Social Determinants of Health   Financial Resource Strain:   . Difficulty of Paying Living Expenses: Not on file  Food Insecurity:   . Worried About Charity fundraiser in the Last Year: Not on file  . Ran Out of Food in the Last Year: Not on file  Transportation Needs:   . Lack of Transportation (Medical): Not on file  . Lack of Transportation (Non-Medical): Not on file  Physical Activity:   . Days of Exercise per Week: Not on file  . Minutes of Exercise per Session: Not on file  Stress:   . Feeling of Stress : Not on file  Social Connections:   . Frequency of Communication with Friends and Family: Not on file  . Frequency of Social Gatherings with Friends and Family: Not on file  . Attends Religious Services: Not on file  . Active Member of Clubs or Organizations: Not on file  . Attends Archivist Meetings: Not  on file  . Marital Status: Not on file   Additional Social History:    Allergies:   Allergies  Allergen Reactions  . Ibuprofen Swelling    Tongue swelling  . Risperidone And Related Other (See Comments) and Swelling    gynecomastia gynecomastia   . Ziprasidone Swelling    Tongue swells  . Quetiapine Other (See Comments)    Depression, suicidality, adverse effect: seizures     Labs:  Results for orders placed or performed during the hospital encounter of 01/27/19 (from the past 48 hour(s))  CBC with Differential     Status: None   Collection Time: 01/27/19  6:06 PM  Result Value Ref Range   WBC 5.7 4.0 - 10.5 K/uL   RBC 4.77 4.22 - 5.81 MIL/uL   Hemoglobin 14.0 13.0 - 17.0 g/dL   HCT 41.7 39.0 - 52.0 %   MCV 87.4 80.0 - 100.0 fL   MCH 29.4 26.0 - 34.0 pg   MCHC  33.6 30.0 - 36.0 g/dL   RDW 25.9 56.3 - 87.5 %   Platelets 237 150 - 400 K/uL   nRBC 0.0 0.0 - 0.2 %   Neutrophils Relative % 52 %   Neutro Abs 2.9 1.7 - 7.7 K/uL   Lymphocytes Relative 36 %   Lymphs Abs 2.1 0.7 - 4.0 K/uL   Monocytes Relative 12 %   Monocytes Absolute 0.7 0.1 - 1.0 K/uL   Eosinophils Relative 0 %   Eosinophils Absolute 0.0 0.0 - 0.5 K/uL   Basophils Relative 0 %   Basophils Absolute 0.0 0.0 - 0.1 K/uL   Immature Granulocytes 0 %   Abs Immature Granulocytes 0.02 0.00 - 0.07 K/uL    Comment: Performed at [facility], [address] [place], [place] [address]  Comprehensive metabolic panel     Status: None   Collection Time: 01/27/19  6:06 PM  Result Value Ref Range   Sodium 141 135 - 145 mmol/L   Potassium 3.7 3.5 - 5.1 mmol/L   Chloride 105 98 - 111 mmol/L   CO2 23 22 - 32 mmol/L   Glucose, Bld 79 70 - 99 mg/dL   BUN 15 6 - 20 mg/dL   Creatinine, Ser 9.51 0.61 - 1.24 mg/dL   Calcium 9.2 8.9 - 88.4 mg/dL   Total Protein 7.6 6.5 - 8.1 g/dL   Albumin 4.3 3.5 - 5.0 g/dL   AST 30 15 - 41 U/L   ALT 19 0 - 44 U/L   Alkaline Phosphatase 68 38 - 126 U/L   Total Bilirubin 0.7  0.3 - 1.2 mg/dL   GFR calc non Af Amer >60 >60 mL/min   GFR calc Af Amer >60 >60 mL/min   Anion gap 13 5 - 15    Comment: Performed at [facility], [address] [place], [place] [address]  Ethanol     Status: None   Collection Time: 01/27/19  6:06 PM  Result Value Ref Range   Alcohol, Ethyl (B) <10 <10 mg/dL    Comment: (NOTE) Lowest detectable limit for serum alcohol is 10 mg/dL. For medical purposes only. Performed at [facility], [address] [place], [place] [address]   Salicylate level     Status: Abnormal   Collection Time: 01/27/19  6:06 PM  Result Value Ref Range   Salicylate Lvl <7.0 (L) 7.0 - 30.0 mg/dL    Comment: Performed at [facility], [address] [place], [place] [address]  Acetaminophen level     Status: Abnormal   Collection Time: 01/27/19  6:06 PM  Result Value Ref Range   Acetaminophen (Tylenol), Serum <10 (L) 10 - 30 ug/mL    Comment: (NOTE) Therapeutic concentrations vary significantly. A range of 10-30 ug/mL  may be an effective concentration for many patients. However, some  are best treated at concentrations outside of this range. Acetaminophen concentrations >150 ug/mL at 4 hours after ingestion  and >50 ug/mL at 12 hours after ingestion are often associated with  toxic reactions. Performed at [facility], [address] [place], [place] [address]     No current facility-administered medications for this encounter.   Current Outpatient Medications  Medication Sig Dispense Refill  . ARIPiprazole ER (ABILIFY MAINTENA) 400 MG SRER injection Inject 2 mLs (400 mg total) into the muscle every 28 (twenty-eight) days. Next dose is due about December 28, 2018 1 each 1  . benztropine (COGENTIN) 0.5 MG tablet Take 1 tablet (0.5  mg total) by mouth daily. 30 tablet 1  . divalproex (DEPAKOTE ER) 500 MG 24 hr tablet Take 1 tablet (500 mg total) by mouth daily. 30 tablet 1  . ARIPiprazole (ABILIFY) 10 MG  tablet Take 1 tablet (10 mg total) by mouth daily. (Patient not taking: Reported on 01/27/2019) 15 tablet 0    Musculoskeletal: Strength & Muscle Tone: unable to assess Gait & Station: unable to assess Patient leans: N/A  Psychiatric Specialty Exam: Physical Exam  Nursing note and vitals reviewed. Constitutional: He is oriented to person, place, and time. He appears well-developed.  HENT:  Head: Normocephalic.  Eyes: Pupils are equal, round, and reactive to light.  Respiratory: Effort normal.  Musculoskeletal:        General: Normal range of motion.     Cervical back: Normal range of motion.  Neurological: He is alert and oriented to person, place, and time.  Skin: Skin is warm and dry.    Review of Systems  Psychiatric/Behavioral: Positive for confusion, decreased concentration, self-injury and suicidal ideas.  All other systems reviewed and are negative.   Blood pressure 123/83, pulse 86, resp. rate 18, height 5\' 11"  (1.803 m), weight 68 kg, SpO2 96 %.Body mass index is 20.92 kg/m.  General Appearance: Casual  Eye Contact:  Absent  Speech:  Garbled  Volume:  Decreased  Mood:  unable to assess  Affect:  Congruent  Thought Process:  Disorganized and Descriptions of Associations: Intact  Orientation:  NA  Thought Content:  Illogical  Suicidal Thoughts:  Yes.  with intent/plan  Homicidal Thoughts:  No  Memory:  Immediate;   unable to assess  Judgement:  Impaired  Insight:  Lacking  Psychomotor Activity:  Decreased  Concentration:  Concentration: Fair  Recall:  Poor  Fund of Knowledge:  Fair  Language:  Fair  Akathisia:  NA  Handed:  Right  AIMS (if indicated):     Assets:  Leisure Time  ADL's:  Intact  Cognition:  Impaired,  Moderate  Sleep:        Treatment Plan Summary: Plan Inpatient for psychiatric stabilization  Disposition: Recommend psychiatric Inpatient admission when medically cleared. Supportive therapy provided about ongoing stressors.  , NP 01/27/2019 10:47 PM

## 2019-01-27 NOTE — BH Assessment (Signed)
Referral information for Psychiatric Hospitalization faxed to;   [facility] [phone]),   . [facility] [phone] -or- [phone]) [phone]  . [facility] [phone]),   . [facility] [phone] -or- [phone]),   . [facility] [phone] or [phone])  . [facility] [phone])   [facility] [phone])   [facility] [phone])

## 2019-01-27 NOTE — ED Notes (Signed)
Pt's mother called to check on patient to ensure he was safe.  Contact number: [phone]

## 2019-01-27 NOTE — ED Triage Notes (Signed)
Pt brought to ER with EMS and [employer] after he was seen  running out into traffic.  Pt denies SI. Denies pain. Pt cooperative with staff.

## 2019-01-27 NOTE — BH Assessment (Signed)
Assessment Note  [name] is an 29 y.o. male presenting to [facility] ED under IVC given by [facility] due to patient being seen running out into traffic. Per triage not patient denies SI and was cooperative with staff. During assessment patient appeared drowsy and depressed and had difficulty keeping his eyes open and staying awake. When asked why patient was presenting to ED patient reports he does not recall walking into traffic. Patient reports that he currently lives with his mother but that their relationship is back and forth. Patient reports that he has hat not been sleeping "only 2 hours" but reports his appetite is "okay." Patient denies SI/HI/AH/VH. Patient denies being on any current medications and is not linked with a psychiatrist or a therapist for outpatient treatment. Patient reports that he does have a history of inpatient treatment but was unable to recall when he was admitted, he was able to report one reason being that he tried to commit suicide but was unable to recall what he did. Per patient's chart review patient has numerous visits to [facility] ED most recent being on 12/30/18 for Cocaine abuse and 11/29/18 for hallucinations where he was admitted to [facility] for inpatient. 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 Patient was able to report that he currently uses Cocaine, he reports that he used cocaine today "I just did like 2 hits." He reports that he has been using Cocaine since the age of 69.   Per Psyc NP patient is recommended for Inpatient Hospitlization   Diagnosis: Cocaine induced-mood disorder, Schizophrenia  Past Medical History:  Past Medical History:  Diagnosis Date  . Schizophrenia (HCC)     No past surgical history on file.  Family History: No family history on file.  Social History:  reports that he has been smoking. He has never used smokeless tobacco. He reports current alcohol use. He reports current drug use.  Drug: Cocaine.  Additional Social History:  Alcohol / Drug Use Pain Medications: See MAR Prescriptions: See MAR Over the Counter: See MAR History of alcohol / drug use?: Yes Substance #1 Name of Substance 1: Cocaine 1 - Age of First Use: 18 1 - Last Use / Amount: 01/27/19  CIWA: CIWA-Ar BP: 123/83 Pulse Rate: 86 COWS:    Allergies:  Allergies  Allergen Reactions  . Ibuprofen Swelling    Tongue swelling  . Risperidone And Related Other (See Comments) and Swelling    gynecomastia gynecomastia   . Ziprasidone Swelling    Tongue swells  . Quetiapine Other (See Comments)    Depression, suicidality, adverse effect: seizures     Home Medications: (Not in a hospital admission)   OB/GYN Status:  No LMP for male patient.  General Assessment Data Location of Assessment: [facility] ED TTS Assessment: In system Is this a Tele or Face-to-Face Assessment?: Face-to-Face Is this an Initial Assessment or a Re-assessment for this encounter?: Initial Assessment Patient Accompanied by:: N/A Language Other than English: No Living Arrangements: Other (Comment)(Reports living with his mother) What gender do you identify as?: Male Marital status: Single Living Arrangements: Other (Comment)(Reports living with his mother) Can pt return to current living arrangement?: Yes Admission Status: Involuntary Petitioner: Police Is patient capable of signing voluntary admission?: No Referral Source: Other Insurance type: Medicare  Medical Screening Exam [facility] Walk-in ONLY) Medical Exam completed: Yes  Crisis Care Plan Living Arrangements: Other (Comment)(Reports living with his mother) Legal Guardian: Other:(Self) Name of Psychiatrist: None Name  of Therapist: None  Education Status Is patient currently in school?: No Is the patient employed, unemployed or receiving disability?: Unemployed  Risk to self with the past 6 months Suicidal Ideation: No Has patient been a risk to self within the  past 6 months prior to admission? : No Suicidal Intent: No Has patient had any suicidal intent within the past 6 months prior to admission? : No Is patient at risk for suicide?: No Suicidal Plan?: No Has patient had any suicidal plan within the past 6 months prior to admission? : No What has been your use of drugs/alcohol within the last 12 months?: Cocaine Use Previous Attempts/Gestures: Yes Triggers for Past Attempts: None known Intentional Self Injurious Behavior: None Family Suicide History: No Recent stressful life event(s): Financial Problems Persecutory voices/beliefs?: No Depression: Yes Depression Symptoms: Despondent, Feeling worthless/self pity Substance abuse history and/or treatment for substance abuse?: Yes Suicide prevention information given to non-admitted patients: Not applicable  Risk to Others within the past 6 months Homicidal Ideation: No Does patient have any lifetime risk of violence toward others beyond the six months prior to admission? : No Thoughts of Harm to Others: No Current Homicidal Intent: No Current Homicidal Plan: No Access to Homicidal Means: No History of harm to others?: No Assessment of Violence: None Noted Does patient have access to weapons?: No Criminal Charges Pending?: No Describe Pending Criminal Charges: Reports he has to go to court but unsure of what Does patient have a court date: Yes Is patient on probation?: No  Psychosis Hallucinations: None noted Delusions: None noted  Mental Status Report Appearance/Hygiene: In scrubs Eye Contact: Poor Motor Activity: Freedom of movement Speech: Slurred, Soft Level of Consciousness: Drowsy Mood: Depressed Affect: Flat Anxiety Level: Minimal Thought Processes: Thought Blocking Judgement: Impaired Orientation: Person, Appropriate for developmental age Obsessive Compulsive Thoughts/Behaviors: None  Cognitive Functioning Concentration: Poor Memory: Recent Impaired, Remote  Intact Is patient IDD: No Insight: Poor Impulse Control: Poor Appetite: Fair Have you had any weight changes? : No Change Sleep: Decreased Total Hours of Sleep: 2 Vegetative Symptoms: None  ADLScreening [facility] Assessment Services) Patient's cognitive ability adequate to safely complete daily activities?: Yes Patient able to express need for assistance with ADLs?: Yes Independently performs ADLs?: Yes (appropriate for developmental age)  Prior Inpatient Therapy Prior Inpatient Therapy: Yes Prior Therapy Dates: "It has been a minute" Prior Therapy Facilty/Provider(s): Unsure which hospitals Reason for Treatment: Schizophrenia, Depression, SI  Prior Outpatient Therapy Prior Outpatient Therapy: No Does patient have an ACCT team?: No Does patient have Intensive In-House Services?  : No Does patient have [facility] services? : No Does patient have P4CC services?: No  ADL Screening (condition at time of admission) Patient's cognitive ability adequate to safely complete daily activities?: Yes Is the patient deaf or have difficulty hearing?: No Does the patient have difficulty seeing, even when wearing glasses/contacts?: No Does the patient have difficulty concentrating, remembering, or making decisions?: No Patient able to express need for assistance with ADLs?: Yes Does the patient have difficulty dressing or bathing?: No Independently performs ADLs?: Yes (appropriate for developmental age) Does the patient have difficulty walking or climbing stairs?: No Weakness of Legs: None Weakness of Arms/Hands: None  Home Assistive Devices/Equipment Home Assistive Devices/Equipment: None  Therapy Consults (therapy consults require a physician order) PT Evaluation Needed: No OT Evalulation Needed: No SLP Evaluation Needed: No Abuse/Neglect Assessment (Assessment to be complete while patient is alone) Abuse/Neglect Assessment Can Be Completed: Yes Physical Abuse: Denies Verbal Abuse:  Denies  Sexual Abuse: Denies Exploitation of patient/patient's resources: Denies Self-Neglect: Denies Values / Beliefs Cultural Requests During Hospitalization: None Spiritual Requests During Hospitalization: None Consults Spiritual Care Consult Needed: No Transition of Care Team Consult Needed: No Advance Directives (For Healthcare) Does Patient Have a Medical Advance Directive?: No Would patient like information on creating a medical advance directive?: No - Patient declined          Disposition: Per Psyc NP patient is recommended for Inpatient Hospitlization  Disposition Initial Assessment Completed for this Encounter: Yes  On Site Evaluation by:   Reviewed with Physician:    Leonie Douglas MS LCAS-A 01/27/2019 8:31 PM

## 2019-01-27 NOTE — ED Notes (Signed)
Pt dressed out by nurse tech and this RN:  Long sleeve white shirt Black shirt Black jacket Black sweatshirt Black jeans Black sweat pants 2 rewards cards Black sneakers Black socks Black long johns

## 2019-01-27 NOTE — ED Provider Notes (Signed)
[facility] Emergency Department Provider Note  ____________________________________________   First MD Initiated Contact with Patient 01/27/19 1745     (approximate)  I have reviewed the triage vital signs and the nursing notes.   HISTORY  Chief Complaint IVC  HPI [name] is a 29 y.o. male with schizophrenia who reportedly has been compliant with his medications who comes in for being out in traffic.  Patient states that he was upset about something that he was trying to hit by car.  He denies any HI or any hallucinations.  Denies any medical concerns.  The start started today, moderate, nothing makes better, nothing makes them worse.          Past Medical History:  Diagnosis Date  . Schizophrenia [facility])     Patient Active Problem List   Diagnosis Date Noted  . Self-inflicted laceration of right wrist ([place]) 11/30/2018  . Suicidal ideation 11/29/2018  . Schizoaffective disorder ([place]) 11/29/2018  . Cocaine abuse with cocaine-induced mood disorder ([place]) 11/06/2018  . Agitation   . Schizoaffective disorder, bipolar type ([place]) 11/10/2017  . Cannabis abuse 11/10/2017    No past surgical history on file.  Prior to Admission medications   Medication Sig Start Date End Date Taking? Authorizing Provider  ARIPiprazole (ABILIFY) 10 MG tablet Take 1 tablet (10 mg total) by mouth daily. 12/01/18   Clapacs, Madie Reno, MD  ARIPiprazole ER (ABILIFY MAINTENA) 400 MG SRER injection Inject 2 mLs (400 mg total) into the muscle every 28 (twenty-eight) days. Next dose is due about December 28, 2018 12/28/18   Clapacs, Madie Reno, MD  benztropine (COGENTIN) 0.5 MG tablet Take 1 tablet (0.5 mg total) by mouth daily. 12/01/18   Clapacs, Madie Reno, MD  divalproex (DEPAKOTE ER) 500 MG 24 hr tablet Take 1 tablet (500 mg total) by mouth daily. 12/01/18   Clapacs, Madie Reno, MD    Allergies Ibuprofen, Risperidone and related, Ziprasidone, and Quetiapine  No family history on  file.  Social History Social History   Tobacco Use  . Smoking status: Current Every Day Smoker  . Smokeless tobacco: Never Used  Substance Use Topics  . Alcohol use: Yes  . Drug use: Yes    Types: Cocaine    Comment: 11/28/2018      Review of Systems Constitutional: No fever/chills Eyes: No visual changes. ENT: No sore throat. Cardiovascular: Denies chest pain. Respiratory: Denies shortness of breath. Gastrointestinal: No abdominal pain.  No nausea, no vomiting.  No diarrhea.  No constipation. Genitourinary: Negative for dysuria. Musculoskeletal: Negative for back pain. Skin: Negative for rash. Neurological: Negative for headaches, focal weakness or numbness. Psych: Upset, in traffic All other ROS negative ____________________________________________   PHYSICAL EXAM:  VITAL SIGNS: ED Triage Vitals [01/27/19 1749]  Enc Vitals Group     BP 123/83     Pulse Rate 86     Resp 18     Temp      Temp src      SpO2 96 %     Weight      Height      Head Circumference      Peak Flow      Pain Score      Pain Loc      Pain Edu?      Excl. in [place]?     Constitutional: Alert and oriented. Well appearing and in no acute distress. Eyes: Conjunctivae are normal. EOMI. Head: Atraumatic. Nose: No congestion/rhinnorhea. Mouth/Throat: Mucous  membranes are moist.   Neck: No stridor. Trachea Midline. FROM Cardiovascular: Normal rate, regular rhythm. Grossly normal heart sounds.  Good peripheral circulation. Respiratory: Normal respiratory effort.  No retractions. Lungs CTAB. Gastrointestinal: Soft and nontender. No distention. No abdominal bruits.  Musculoskeletal: No lower extremity tenderness nor edema.  No joint effusions. Neurologic:  Normal speech and language. No gross focal neurologic deficits are appreciated.  Skin:  Skin is warm, dry and intact. No rash noted. Psychiatric: Flat affect with SI. GU: Deferred   ____________________________________________    LABS (all labs ordered are listed, but only abnormal results are displayed)  Labs Reviewed  SALICYLATE LEVEL - Abnormal; Notable for the following components:      Result Value   Salicylate Lvl <7.0 (*)    All other components within normal limits  ACETAMINOPHEN LEVEL - Abnormal; Notable for the following components:   Acetaminophen (Tylenol), Serum <10 (*)    All other components within normal limits  CBC WITH DIFFERENTIAL/PLATELET  COMPREHENSIVE METABOLIC PANEL  ETHANOL  URINE DRUG SCREEN, QUALITATIVE ([facility] ONLY)   ____________________________________________   INITIAL IMPRESSION / ASSESSMENT AND PLAN / ED COURSE  [name] was evaluated in Emergency Department on 01/27/2019 for the symptoms described in the history of present illness. He was evaluated in the context of the global COVID-19 pandemic, which necessitated consideration that the patient might be at risk for infection with the SARS-CoV-2 virus that causes COVID-19. Institutional protocols and algorithms that pertain to the evaluation of patients at risk for COVID-19 are in a state of rapid change based on information released by regulatory bodies including the CDC and federal and state organizations. These policies and algorithms were followed during the patient's care in the ED.    Pt is without any acute medical complaints. No exam findings to suggest medical cause of current presentation. Will order psychiatric screening labs and discuss further w/ psychiatric service.  D/d includes but is not limited to psychiatric disease, behavioral/personality disorder, inadequate socioeconomic support, medical.  Based on HPI, exam, unremarkable labs, no concern for acute medical problem at this time. No rigidity, clonus, hyperthermia, focal neurologic deficit, diaphoresis, tachycardia, meningismus, ataxia, gait abnormality or other finding to suggest this visit represents a non-psychiatric problem. Screening labs reviewed.     Given this, pt medically cleared, to be dispositioned per Psych.    ____________________________________________   FINAL CLINICAL IMPRESSION(S) / ED DIAGNOSES   Final diagnoses:  Suicide ideation      MEDICATIONS GIVEN DURING THIS VISIT:  Medications - No data to display   ED Discharge Orders    None       Note:  This document was prepared using Dragon voice recognition software and may include unintentional dictation errors.   Concha Se, MD 01/27/19 [phone]

## 2019-01-27 NOTE — ED Notes (Signed)
Pt cooperative with dressing out and lab draw. Given sandwich tray and juice.

## 2019-01-28 DIAGNOSIS — F1414 Cocaine abuse with cocaine-induced mood disorder: Secondary | ICD-10-CM

## 2019-01-28 NOTE — ED Notes (Signed)
Patient is alert and oriented, no signs of distress, receive breakfast tray, will continue to monitor.

## 2019-01-28 NOTE — ED Provider Notes (Signed)
-----------------------------------------   6:29 AM on 01/28/2019 -----------------------------------------   Blood pressure 123/83, pulse 86, resp. rate 18, height 5\' 11"  (1.803 m), weight 68 kg, SpO2 96 %.  The patient is sleeping at this time.  There have been no acute events since the last update.  Awaiting disposition plan from Behavioral Medicine and/or Social Work team(s).   , MD 01/28/19 [phone]

## 2019-01-28 NOTE — Discharge Instructions (Addendum)
Please seek medical attention and help for any thoughts about wanting to harm yourself, harm others, any concerning change in behavior, severe depression, inappropriate drug use or any other new or concerning symptoms. ° °

## 2019-01-28 NOTE — ED Notes (Signed)
Patient voiced understanding of discharge instructions, all belongings given back to Patient, no signs of distress, Patient left per self, talked to mom to let her know that He would be coming home.
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
## 2019-01-28 NOTE — BH Assessment (Signed)
Writer spoke with patient to complete updated/reassessment. Patient denies SI/HI and AV/H. 

## 2019-02-10 ENCOUNTER — Emergency Department
Admission: EM | Admit: 2019-02-10 | Discharge: 2019-02-10 | Disposition: A | Discharge status: Home / Self Care | Payer: Medicare Other | Attending: Emergency Medicine | Admitting: Emergency Medicine

## 2019-02-10 ENCOUNTER — Encounter: Payer: Self-pay | Admitting: Emergency Medicine

## 2019-02-10 ENCOUNTER — Other Ambulatory Visit: Payer: Self-pay

## 2019-02-10 DIAGNOSIS — F17200 Nicotine dependence, unspecified, uncomplicated: Secondary | ICD-10-CM | POA: Diagnosis not present

## 2019-02-10 DIAGNOSIS — R531 Weakness: Secondary | ICD-10-CM | POA: Insufficient documentation

## 2019-02-10 DIAGNOSIS — F1210 Cannabis abuse, uncomplicated: Secondary | ICD-10-CM | POA: Diagnosis not present

## 2019-02-10 DIAGNOSIS — Z139 Encounter for screening, unspecified: Secondary | ICD-10-CM

## 2019-02-10 NOTE — Discharge Instructions (Addendum)
Continue taking your medication as prescribed by your doctor.  Return to the emergency department if any urgent problems or concerns.

## 2019-02-10 NOTE — ED Provider Notes (Signed)
[facility] Emergency Department Provider Note  ____________________________________________   First MD Initiated Contact with Patient 02/10/19 [phone]     (approximate)  I have reviewed the triage vital signs and the nursing notes.   HISTORY  Chief Complaint Weakness    HPI [name] is a 29 y.o. male presents to the ED via EMS with complaint of "weakness".  Patient states that he was walking to the store to get some chips and laid down on the sidewalk.  Patient admits that he smoked marijuana prior to this.  He denies falling, hitting his head or any LOC.  Patient denies any nausea, vomiting, diarrhea, there is been no fever, chills or cough.  Patient denies any contact with Covid.  Patient reports that he has continued to take his medication as prescribed by his doctor.  He denies any muscle skeletal pain, abdominal pain or headache.  Patient reports that he lives with his mother.  He rates his pain as a 0/10.      Past Medical History:  Diagnosis Date  . Schizophrenia [facility])     Patient Active Problem List   Diagnosis Date Noted  . Self-inflicted laceration of right wrist (HCC) 11/30/2018  . Suicide ideation 11/29/2018  . Cocaine abuse with cocaine-induced mood disorder (HCC) 11/06/2018  . Agitation   . Schizoaffective disorder, bipolar type (HCC) 11/10/2017  . Cannabis abuse 11/10/2017    History reviewed. No pertinent surgical history.  Prior to Admission medications   Medication Sig Start Date End Date Taking? Authorizing Provider  haloperidol decanoate (HALDOL DECANOATE) 50 MG/ML injection Inject 150 mg into the muscle every 28 (twenty-eight) days.   Yes [provider]  ARIPiprazole ER (ABILIFY MAINTENA) 400 MG SRER injection Inject 2 mLs (400 mg total) into the muscle every 28 (twenty-eight) days. Next dose is due about December 28, 2018 12/28/18   Clapacs, Jackquline Denmark, MD  benztropine (COGENTIN) 0.5 MG tablet Take 1 tablet (0.5 mg  total) by mouth daily. 12/01/18   Clapacs, Jackquline Denmark, MD  divalproex (DEPAKOTE ER) 500 MG 24 hr tablet Take 1 tablet (500 mg total) by mouth daily. 12/01/18   Clapacs, Jackquline Denmark, MD    Allergies Ibuprofen, Risperidone and related, Ziprasidone, and Quetiapine  History reviewed. No pertinent family history.  Social History Social History   Tobacco Use  . Smoking status: Current Every Day Smoker  . Smokeless tobacco: Never Used  Substance Use Topics  . Alcohol use: Yes  . Drug use: Yes    Types: Cocaine, Marijuana    Comment: 11/28/2018    Review of Systems Constitutional: No fever/chills Eyes: No visual changes. ENT: No sore throat.  Negative for nasal congestion. Cardiovascular: Denies chest pain. Respiratory: Denies shortness of breath.  Negative for cough. Gastrointestinal: No abdominal pain.  No nausea, no vomiting.  No diarrhea.  No constipation. Genitourinary: Negative for dysuria. Musculoskeletal: Negative for muscle aches. Skin: Negative for rash. Neurological: Negative for headaches, focal weakness or numbness. Psychiatric:  Positive for schizophrenia ____________________________________________   PHYSICAL EXAM:  VITAL SIGNS: ED Triage Vitals  Enc Vitals Group     BP 02/10/19 0742 124/76     Pulse Rate 02/10/19 0742 (!) 52     Resp 02/10/19 0742 16     Temp 02/10/19 0742 (!) 97 F (36.1 C)     Temp Source 02/10/19 0742 Oral     SpO2 02/10/19 0742 100 %     Weight 02/10/19 0743 145 lb (65.8 kg)  Height 02/10/19 0743 5\' 9"  (1.753 m)     Head Circumference --      Peak Flow --      Pain Score 02/10/19 0743 0     Pain Loc --      Pain Edu? --      Excl. in [place]? --     Constitutional: Alert and oriented. Well appearing and in no acute distress.  Patient is lying on the stretcher resting and answering questions with nursing staff and provider. Eyes: Conjunctivae are normal. PERRL. EOMI. Head: Atraumatic. Nose: No trauma. Neck: No stridor.  No cervical  tenderness on palpation posteriorly. Cardiovascular: Normal rate, regular rhythm. Grossly normal heart sounds.  Good peripheral circulation. Respiratory: Normal respiratory effort.  No retractions. Lungs CTAB. Gastrointestinal: Soft and nontender. No distention.  Musculoskeletal: Moves upper and lower extremities without any difficulty.  There is no tenderness on palpation of the thoracic or lumbar spine.  No tenderness on compression of the hips.  There is no point tenderness on palpation of bilateral shoulders, knees or ankles.  There is no abrasions or discoloration noted on exam.  No edema noted in lower extremities. Neurologic:  Normal speech and language. No gross focal neurologic deficits are appreciated. No gait instability. Skin:  Skin is warm, dry and intact. No rash noted. Psychiatric: Mood and affect are normal. Speech and behavior are normal.  ____________________________________________   LABS (all labs ordered are listed, but only abnormal results are displayed)  Labs Reviewed - No data to display    PROCEDURES  Procedure(s) performed (including Critical Care):  Procedures   ____________________________________________   INITIAL IMPRESSION / ASSESSMENT AND PLAN / ED COURSE  As part of my medical decision making, I reviewed the following data within the electronic MEDICAL RECORD NUMBER Notes from prior ED visits and [place] Controlled Substance Database  29 year old male presents to the ED via EMS after he was seen lying on a sidewalk.  Patient states that he was walking to a store to buy chips after smoking some marijuana.  Patient denies any injury and states that he wants something to eat as he is hungry.  Patient has a history of schizophrenia and reports that he continues to take his regular medication including his monthly injection of Haldol.  He denies any suicidal ideation.  Patient was given a sandwich tray along with some cereal which he ate.  Patient was ambulatory at  the time of discharge and no other complaints were mentioned.  ____________________________________________   FINAL CLINICAL IMPRESSION(S) / ED DIAGNOSES  Final diagnoses:  Encounter for medical screening examination     ED Discharge Orders    None       Note:  This document was prepared using Dragon voice recognition software and may include unintentional dictation errors.    Johnn Hai, PA-C 02/10/19 1309    Drenda Freeze, MD 02/11/19 [phone]

## 2019-02-10 NOTE — ED Triage Notes (Addendum)
Presents via EMS    States he was walking to the store   Became weak and laid down on sidewalk   Denies any pain  States he just wants something to eat  No bruising noted

## 2019-02-15 ENCOUNTER — Encounter: Payer: Self-pay | Admitting: Emergency Medicine

## 2019-02-15 ENCOUNTER — Other Ambulatory Visit: Payer: Self-pay

## 2019-02-15 ENCOUNTER — Emergency Department
Admission: EM | Admit: 2019-02-15 | Discharge: 2019-02-16 | Disposition: A | Discharge status: Home / Self Care | Payer: Medicare Other | Attending: Emergency Medicine | Admitting: Emergency Medicine

## 2019-02-15 DIAGNOSIS — R112 Nausea with vomiting, unspecified: Secondary | ICD-10-CM | POA: Diagnosis present

## 2019-02-15 DIAGNOSIS — F209 Schizophrenia, unspecified: Secondary | ICD-10-CM | POA: Insufficient documentation

## 2019-02-15 DIAGNOSIS — Z79899 Other long term (current) drug therapy: Secondary | ICD-10-CM | POA: Diagnosis not present

## 2019-02-15 DIAGNOSIS — F1910 Other psychoactive substance abuse, uncomplicated: Secondary | ICD-10-CM | POA: Diagnosis not present

## 2019-02-15 DIAGNOSIS — F17200 Nicotine dependence, unspecified, uncomplicated: Secondary | ICD-10-CM | POA: Diagnosis not present

## 2019-02-15 LAB — COMPREHENSIVE METABOLIC PANEL
ALT: 17 U/L (ref 0–44)
AST: 22 U/L (ref 15–41)
Albumin: 3.9 g/dL (ref 3.5–5.0)
Alkaline Phosphatase: 57 U/L (ref 38–126)
Anion gap: 7 (ref 5–15)
BUN: 16 mg/dL (ref 6–20)
CO2: 27 mmol/L (ref 22–32)
Calcium: 8.8 mg/dL — ABNORMAL LOW (ref 8.9–10.3)
Chloride: 105 mmol/L (ref 98–111)
Creatinine, Ser: 1.11 mg/dL (ref 0.61–1.24)
GFR calc Af Amer: >60 mL/min (ref >60)
GFR calc non Af Amer: >60 mL/min (ref >60)
Glucose, Bld: 108 mg/dL — ABNORMAL HIGH (ref 70–99)
Potassium: 3.7 mmol/L (ref 3.5–5.1)
Sodium: 139 mmol/L (ref 135–145)
Total Bilirubin: 0.6 mg/dL (ref 0.3–1.2)
Total Protein: 6.7 g/dL (ref 6.5–8.1)

## 2019-02-15 LAB — LIPASE, BLOOD: Lipase: 21 U/L (ref 11–51)

## 2019-02-15 LAB — CBC
HCT: 39.6 % (ref 39.0–52.0)
Hemoglobin: 13.7 g/dL (ref 13.0–17.0)
MCH: 29.9 pg (ref 26.0–34.0)
MCHC: 34.6 g/dL (ref 30.0–36.0)
MCV: 86.5 fL (ref 80.0–100.0)
Platelets: 204 10*3/uL (ref 150–400)
RBC: 4.58 MIL/uL (ref 4.22–5.81)
RDW: 12.7 % (ref 11.5–15.5)
WBC: 6.2 10*3/uL (ref 4.0–10.5)
nRBC: 0 % (ref 0.0–0.2)

## 2019-02-15 LAB — ETHANOL: Alcohol, Ethyl (B): <10 mg/dL (ref <10)

## 2019-02-15 MED ORDER — ONDANSETRON 4 MG PO TBDP: 4.0000 mg | ORAL_TABLET | Freq: Three times a day (TID) | ORAL | 0 refills | Status: DC | PRN

## 2019-02-15 MED ORDER — ONDANSETRON HCL 4 MG/2ML IJ SOLN
4.0000 mg | Freq: Once | INTRAMUSCULAR | Status: AC
  Administered 2019-02-15: 4 mg via INTRAVENOUS
  Filled 2019-02-15: qty 2

## 2019-02-15 MED ORDER — SODIUM CHLORIDE 0.9 % IV BOLUS
1000.0000 mL | Freq: Once | INTRAVENOUS | Status: AC
  Administered 2019-02-15: 1000 mL via INTRAVENOUS

## 2019-02-15 NOTE — ED Provider Notes (Addendum)
[facility] Emergency Department Provider Note  Time seen: 8:42 PM  I have reviewed the triage vital signs and the nursing notes.   HISTORY  Chief Complaint Drug Overdose   HPI [name] is a 28 y.o. male with a past medical history of schizophrenia, substance abuse, presents to the emergency department for medical evaluation.   According to the patient he used cocaine and ate a weed brownie tonight.  Patient states he felt very unwell with lots of nausea and vomiting, states he felt like his heart was going to stop but denies any chest pain at any point.  Denies any abdominal pain.  No suicidal intent.  Largely negative review of systems otherwise.  Patient is quite somnolent but does awaken to voice and answers questions appropriately somewhat slurred speech.  Denies alcohol use.  Past Medical History:  Diagnosis Date  . Schizophrenia [facility])     Patient Active Problem List   Diagnosis Date Noted  . Self-inflicted laceration of right wrist (HCC) 11/30/2018  . Suicide ideation 11/29/2018  . Cocaine abuse with cocaine-induced mood disorder (HCC) 11/06/2018  . Agitation   . Schizoaffective disorder, bipolar type (HCC) 11/10/2017  . Cannabis abuse 11/10/2017    No past surgical history on file.  Prior to Admission medications   Medication Sig Start Date End Date Taking? Authorizing Provider  ARIPiprazole ER (ABILIFY MAINTENA) 400 MG SRER injection Inject 2 mLs (400 mg total) into the muscle every 28 (twenty-eight) days. Next dose is due about December 28, 2018 12/28/18   Clapacs, Jackquline Denmark, MD  benztropine (COGENTIN) 0.5 MG tablet Take 1 tablet (0.5 mg total) by mouth daily. 12/01/18   Clapacs, Jackquline Denmark, MD  divalproex (DEPAKOTE ER) 500 MG 24 hr tablet Take 1 tablet (500 mg total) by mouth daily. 12/01/18   Clapacs, Jackquline Denmark, MD  haloperidol decanoate (HALDOL DECANOATE) 50 MG/ML injection Inject 150 mg into the muscle every 28 (twenty-eight) days.    [provider]    Allergies  Allergen Reactions  . Ibuprofen Swelling    Tongue swelling  . Risperidone And Related Other (See Comments) and Swelling    gynecomastia gynecomastia   . Ziprasidone Swelling    Tongue swells  . Quetiapine Other (See Comments)    Depression, suicidality, adverse effect: seizures     No family history on file.  Social History Social History   Tobacco Use  . Smoking status: Current Every Day Smoker  . Smokeless tobacco: Never Used  Substance Use Topics  . Alcohol use: Yes  . Drug use: Yes    Types: Cocaine, Marijuana    Comment: 11/28/2018    Review of Systems Constitutional: Negative for fever. Cardiovascular: Negative for chest pain. Respiratory: Negative for shortness of breath. Gastrointestinal: Negative for abdominal pain.  Positive for nausea and vomiting. Musculoskeletal: Negative for musculoskeletal complaints Neurological: Negative for headache All other ROS negative  ____________________________________________   PHYSICAL EXAM:  VITAL SIGNS: ED Triage Vitals  Enc Vitals Group     BP 02/15/19 2028 (!) 86/47     Pulse Rate 02/15/19 2028 66     Resp 02/15/19 2028 18     Temp 02/15/19 2028 98.5 F (36.9 C)     Temp Source 02/15/19 2028 Oral     SpO2 02/15/19 2028 100 %     Weight --      Height --      Head Circumference --      Peak Flow --  Pain Score 02/15/19 2041 0     Pain Loc --      Pain Edu? --      Excl. in [place]? --    Constitutional: Alert and oriented. Well appearing and in no distress. Eyes: Normal exam, 2 mm PERRL. ENT      Head: Normocephalic and atraumatic.      Mouth/Throat: Mucous membranes are moist. Cardiovascular: Normal rate, regular rhythm.  Respiratory: Normal respiratory effort without tachypnea nor retractions. Breath sounds are clear  Gastrointestinal: Soft and nontender. No distention.  Musculoskeletal: Nontender with normal range of motion in all extremities. Neurologic: Somewhat  slurred/slowed speech.  No gross focal neurologic deficits, moving all extremities well. Skin:  Skin is warm, dry and intact.  Psychiatric: Mood and affect are normal.   ____________________________________________    EKG  EKG viewed and interpreted by myself shows a normal sinus rhythm at 65 bpm with a narrow QRS, normal axis, normal intervals, no concerning ST changes.  Reassuring EKG.  ____________________________________________   INITIAL IMPRESSION / ASSESSMENT AND PLAN / ED COURSE  Pertinent labs & imaging results that were available during my care of the patient were reviewed by me and considered in my medical decision making (see chart for details).   Patient presents emergency department after using cocaine and ingesting marijuana.  States significant nausea and vomiting he felt like his heart was going to stop per patient.  Currently the patient appears well he is somewhat somnolent with slow/slurred speech.  Initial blood pressure somewhat hypotensive as well.  We will IV hydrate, check labs, urine sample and continue to closely monitor the patient.  We will also obtain an EKG.  Lab work is largely within normal limits urine drug screen pending.  Patient is sleepy but does awaken to voice answer questions, appears improved.  Blood pressure currently 110/66.  Patient is eating a sandwich tray.  Patient states he feels better he is going to call a ride to come pick him up.   [name] was evaluated in Emergency Department on 02/15/2019 for the symptoms described in the history of present illness. He was evaluated in the context of the global COVID-19 pandemic, which necessitated consideration that the patient might be at risk for infection with the SARS-CoV-2 virus that causes COVID-19. Institutional protocols and algorithms that pertain to the evaluation of patients at risk for COVID-19 are in a state of rapid change based on information released by regulatory bodies including  the CDC and federal and state organizations. These policies and algorithms were followed during the patient's care in the ED.  ____________________________________________   FINAL CLINICAL IMPRESSION(S) / ED DIAGNOSES  Substance abuse Nausea vomiting   Harvest Dark, MD 02/15/19 2683    Harvest Dark, MD 02/15/19 [phone]

## 2019-02-15 NOTE — ED Notes (Signed)
Pt awake and alert at this time.  Sitting up in bed eating graham crackers and drinking water.  Will continue to monitor and assess PO challenge.

## 2019-02-15 NOTE — ED Triage Notes (Signed)
Pt to ED via EMS from the [employer] [place] for drug overdose.  Per EMS patient was unresponsive on the ground, became more alert upon arrival to ED.  Pt presents with vomit on clothes, states used weed brownies and snorted cocaine tonight.  Patient is cold, warm blankets placed on patient.  Pt has poor concentration, mostly incoherent speech.

## 2019-02-15 NOTE — ED Notes (Signed)
Pt continues to be very drowsy again, attempting to wake patient up to find ride but patient will not answer this RN.

## 2019-02-16 DIAGNOSIS — F1910 Other psychoactive substance abuse, uncomplicated: Secondary | ICD-10-CM | POA: Diagnosis not present

## 2019-02-16 MED ORDER — ONDANSETRON 4 MG PO TBDP: 4.0000 mg | ORAL_TABLET | Freq: Three times a day (TID) | ORAL | 0 refills | Status: DC | PRN

## 2019-02-16 MED ORDER — LIDOCAINE 5 % EX PTCH
2.0000 | MEDICATED_PATCH | CUTANEOUS | Status: DC
  Administered 2019-02-16: 2 via TRANSDERMAL
  Filled 2019-02-16: qty 2

## 2019-02-16 NOTE — ED Notes (Signed)
Attempted to wake patient to ask who could be called for his discharge.  Patient with poor concentration and not answering RN.

## 2019-02-16 NOTE — ED Notes (Signed)
Attempted to call mother number and another number in the chart. Unable to get a hold of anyone this time.

## 2019-02-16 NOTE — ED Notes (Signed)
Attempted to call another number for pt's mom. Still no answer. Pt given crackers and peanut butter and drink. Pt states he can walk home he doesn't live far. Pt is alert and ambulatory with steady gait.

## 2019-02-22 ENCOUNTER — Emergency Department: Payer: Medicare Other

## 2019-02-22 ENCOUNTER — Emergency Department
Admission: EM | Admit: 2019-02-22 | Discharge: 2019-02-22 | Payer: Medicare Other | Attending: Student in an Organized Health Care Education/Training Program | Admitting: Student in an Organized Health Care Education/Training Program

## 2019-02-22 ENCOUNTER — Encounter: Payer: Self-pay | Admitting: Emergency Medicine

## 2019-02-22 ENCOUNTER — Other Ambulatory Visit: Payer: Self-pay

## 2019-02-22 DIAGNOSIS — E162 Hypoglycemia, unspecified: Secondary | ICD-10-CM | POA: Diagnosis not present

## 2019-02-22 DIAGNOSIS — R4182 Altered mental status, unspecified: Secondary | ICD-10-CM | POA: Insufficient documentation

## 2019-02-22 DIAGNOSIS — Y92810 Car as the place of occurrence of the external cause: Secondary | ICD-10-CM | POA: Insufficient documentation

## 2019-02-22 DIAGNOSIS — Y999 Unspecified external cause status: Secondary | ICD-10-CM | POA: Diagnosis not present

## 2019-02-22 DIAGNOSIS — Y9389 Activity, other specified: Secondary | ICD-10-CM | POA: Insufficient documentation

## 2019-02-22 DIAGNOSIS — F1910 Other psychoactive substance abuse, uncomplicated: Secondary | ICD-10-CM

## 2019-02-22 DIAGNOSIS — Z23 Encounter for immunization: Secondary | ICD-10-CM | POA: Diagnosis not present

## 2019-02-22 DIAGNOSIS — R451 Restlessness and agitation: Secondary | ICD-10-CM | POA: Diagnosis not present

## 2019-02-22 DIAGNOSIS — S0181XA Laceration without foreign body of other part of head, initial encounter: Secondary | ICD-10-CM | POA: Diagnosis present

## 2019-02-22 DIAGNOSIS — F1210 Cannabis abuse, uncomplicated: Secondary | ICD-10-CM | POA: Diagnosis not present

## 2019-02-22 DIAGNOSIS — F17200 Nicotine dependence, unspecified, uncomplicated: Secondary | ICD-10-CM | POA: Insufficient documentation

## 2019-02-22 DIAGNOSIS — F1410 Cocaine abuse, uncomplicated: Secondary | ICD-10-CM | POA: Diagnosis not present

## 2019-02-22 DIAGNOSIS — F209 Schizophrenia, unspecified: Secondary | ICD-10-CM | POA: Diagnosis not present

## 2019-02-22 DIAGNOSIS — W2209XA Striking against other stationary object, initial encounter: Secondary | ICD-10-CM | POA: Insufficient documentation

## 2019-02-22 LAB — CBC WITH DIFFERENTIAL/PLATELET
Abs Immature Granulocytes: 0 10*3/uL (ref 0.00–0.07)
Basophils Absolute: 0 10*3/uL (ref 0.0–0.1)
Basophils Relative: 0 %
Eosinophils Absolute: 0 10*3/uL (ref 0.0–0.5)
Eosinophils Relative: 1 %
HCT: 38.8 % — ABNORMAL LOW (ref 39.0–52.0)
Hemoglobin: 12.9 g/dL — ABNORMAL LOW (ref 13.0–17.0)
Immature Granulocytes: 0 %
Lymphocytes Relative: 39 %
Lymphs Abs: 1.9 10*3/uL (ref 0.7–4.0)
MCH: 29.3 pg (ref 26.0–34.0)
MCHC: 33.2 g/dL (ref 30.0–36.0)
MCV: 88 fL (ref 80.0–100.0)
Monocytes Absolute: 0.5 10*3/uL (ref 0.1–1.0)
Monocytes Relative: 11 %
Neutro Abs: 2.4 10*3/uL (ref 1.7–7.7)
Neutrophils Relative %: 49 %
Platelets: 220 10*3/uL (ref 150–400)
RBC: 4.41 MIL/uL (ref 4.22–5.81)
RDW: 12.9 % (ref 11.5–15.5)
WBC: 4.9 10*3/uL (ref 4.0–10.5)
nRBC: 0 % (ref 0.0–0.2)

## 2019-02-22 LAB — COMPREHENSIVE METABOLIC PANEL
ALT: 16 U/L (ref 0–44)
AST: 21 U/L (ref 15–41)
Albumin: 3.5 g/dL (ref 3.5–5.0)
Alkaline Phosphatase: 50 U/L (ref 38–126)
Anion gap: 7 (ref 5–15)
BUN: 10 mg/dL (ref 6–20)
CO2: 26 mmol/L (ref 22–32)
Calcium: 8.4 mg/dL — ABNORMAL LOW (ref 8.9–10.3)
Chloride: 107 mmol/L (ref 98–111)
Creatinine, Ser: 0.81 mg/dL (ref 0.61–1.24)
GFR calc Af Amer: >60 mL/min (ref >60)
GFR calc non Af Amer: >60 mL/min (ref >60)
Glucose, Bld: 62 mg/dL — ABNORMAL LOW (ref 70–99)
Potassium: 3.3 mmol/L — ABNORMAL LOW (ref 3.5–5.1)
Sodium: 140 mmol/L (ref 135–145)
Total Bilirubin: 0.5 mg/dL (ref 0.3–1.2)
Total Protein: 6.5 g/dL (ref 6.5–8.1)

## 2019-02-22 LAB — URINE DRUG SCREEN, QUALITATIVE (ARMC ONLY)
Amphetamines, Ur Screen: NOT DETECTED
Barbiturates, Ur Screen: NOT DETECTED
Benzodiazepine, Ur Scrn: POSITIVE — AB
Cannabinoid 50 Ng, Ur ~~LOC~~: POSITIVE — AB
Cocaine Metabolite,Ur ~~LOC~~: POSITIVE — AB
MDMA (Ecstasy)Ur Screen: NOT DETECTED
Methadone Scn, Ur: NOT DETECTED
Opiate, Ur Screen: NOT DETECTED
Phencyclidine (PCP) Ur S: NOT DETECTED
Tricyclic, Ur Screen: NOT DETECTED

## 2019-02-22 LAB — GLUCOSE, CAPILLARY
Glucose-Capillary: 70 mg/dL (ref 70–99)
Glucose-Capillary: 54 mg/dL — ABNORMAL LOW (ref 70–99)
Glucose-Capillary: 93 mg/dL (ref 70–99)

## 2019-02-22 MED ORDER — DEXTROSE 50 % IV SOLN
25.0000 mL | Freq: Once | INTRAVENOUS | Status: AC
  Administered 2019-02-22: 25 mL via INTRAVENOUS

## 2019-02-22 MED ORDER — DEXTROSE 50 % IV SOLN
INTRAVENOUS | Status: AC
  Filled 2019-02-22: qty 50

## 2019-02-22 MED ORDER — DEXTROSE 50 % IV SOLN
1.0000 | Freq: Once | INTRAVENOUS | Status: AC
  Administered 2019-02-22: 50 mL via INTRAVENOUS

## 2019-02-22 MED ORDER — DEXTROSE 50 % IV SOLN
25.0000 mL | Freq: Once | INTRAVENOUS | Status: AC
  Administered 2019-02-22: 25 mL via INTRAVENOUS
  Filled 2019-02-22: qty 50

## 2019-02-22 MED ORDER — MIDAZOLAM HCL 2 MG/2ML IJ SOLN
2.0000 mg | Freq: Once | INTRAMUSCULAR | Status: AC
  Administered 2019-02-22: 2 mg via INTRAMUSCULAR
  Filled 2019-02-22: qty 2

## 2019-02-22 MED ORDER — SODIUM CHLORIDE 0.9 % IV BOLUS
500.0000 mL | Freq: Once | INTRAVENOUS | Status: AC
  Administered 2019-02-22: 500 mL via INTRAVENOUS

## 2019-02-22 MED ORDER — DIPHENHYDRAMINE HCL 50 MG/ML IJ SOLN
25.0000 mg | Freq: Once | INTRAMUSCULAR | Status: AC
  Administered 2019-02-22: 25 mg via INTRAMUSCULAR
  Filled 2019-02-22: qty 1

## 2019-02-22 MED ORDER — TETANUS-DIPHTH-ACELL PERTUSSIS 5-2.5-18.5 LF-MCG/0.5 IM SUSP
0.5000 mL | Freq: Once | INTRAMUSCULAR | Status: AC
  Administered 2019-02-22: 0.5 mL via INTRAMUSCULAR
  Filled 2019-02-22: qty 0.5

## 2019-02-22 MED ORDER — HALOPERIDOL LACTATE 5 MG/ML IJ SOLN
5.0000 mg | Freq: Once | INTRAMUSCULAR | Status: AC
  Administered 2019-02-22: 5 mg via INTRAMUSCULAR
  Filled 2019-02-22: qty 1

## 2019-02-22 NOTE — ED Notes (Signed)
Spit shield placed by [employer].

## 2019-02-22 NOTE — ED Notes (Signed)
Pt drank 4oz of OJ. And 4oz of sprite.
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
## 2019-02-22 NOTE — ED Notes (Signed)
VS WNL. Pt resting at this time. RR even an unlabored.

## 2019-02-22 NOTE — Consult Note (Signed)
  Patient brought in by [employer] aggressive and violent.  Was IVC by ED provider due to his aggressiveness.  Patient was administered an IM dose of Haldol and Ativan.  Upon review of the chart and the patient, it is determined that the patient and the community would be best served if the patient were to face legal charges for his actions instead of being excused on the grounds of mental health reasons..  Patient has a habit of doing drugs, causing trouble in the community, and being arrested by police.  Upon being arrested patient will bang his head and make suicidal statements.  After some time in the ED he will rescind the statements and be discharged.  From a psychiatric perspective, patient is thought to be exhibiting signs of antisocial personality disorder by the way that he manipulates the situation in order to avoid arrest.  At this time patient does not meet criteria for inpatient hospitalization and his IVC will be rescinded.  Patient to be discharged and remain in custody of [employer] once medically cleared.

## 2019-02-22 NOTE — ED Notes (Signed)
[employer] at bedside 

## 2019-02-22 NOTE — ED Notes (Signed)
Pt to CT

## 2019-02-22 NOTE — ED Provider Notes (Signed)
[facility] Emergency Department Provider Note    First MD Initiated Contact with Patient 02/22/19 [phone]     (approximate)  I have reviewed the triage vital signs and the nursing notes.   HISTORY  Chief Complaint Laceration    HPI [name] is a 29 y.o. male below listed past medical history as well as history of substance abuse presents to the ER under police custody.  Was placed under arrest and imaging placed into the police car started thrashing about and hitting his head on the plexiglass.   No LOC.  Officers tried prevent him from harming himself.  They were able to limit several of the attempts at hitting his head but he did strike his forehead several times causing a small laceration.  Police report that they did find crack on his person.  Uncertain as to when he last used.  Patient uncooperative with police but does calm down and cooperates with me.   Past Medical History:  Diagnosis Date  . Schizophrenia (HCC)    No family history on file. History reviewed. No pertinent surgical history. Patient Active Problem List   Diagnosis Date Noted  . Self-inflicted laceration of right wrist (HCC) 11/30/2018  . Suicide ideation 11/29/2018  . Cocaine abuse with cocaine-induced mood disorder (HCC) 11/06/2018  . Agitation   . Schizoaffective disorder, bipolar type (HCC) 11/10/2017  . Cannabis abuse 11/10/2017      Prior to Admission medications   Medication Sig Start Date End Date Taking? Authorizing Provider  ARIPiprazole ER (ABILIFY MAINTENA) 400 MG SRER injection Inject 2 mLs (400 mg total) into the muscle every 28 (twenty-eight) days. Next dose is due about December 28, 2018 12/28/18   Clapacs, Jackquline Denmark, MD  benztropine (COGENTIN) 0.5 MG tablet Take 1 tablet (0.5 mg total) by mouth daily. 12/01/18   Clapacs, Jackquline Denmark, MD  divalproex (DEPAKOTE ER) 500 MG 24 hr tablet Take 1 tablet (500 mg total) by mouth daily. 12/01/18   Clapacs, Jackquline Denmark, MD    haloperidol decanoate (HALDOL DECANOATE) 50 MG/ML injection Inject 150 mg into the muscle every 28 (twenty-eight) days.    [provider]  ondansetron (ZOFRAN ODT) 4 MG disintegrating tablet Take 1 tablet (4 mg total) by mouth every 8 (eight) hours as needed for nausea or vomiting. 02/16/19   Sharman Cheek, MD    Allergies Ibuprofen, Risperidone and related, Ziprasidone, and Quetiapine    Social History Social History   Tobacco Use  . Smoking status: Current Every Day Smoker  . Smokeless tobacco: Never Used  Substance Use Topics  . Alcohol use: Yes  . Drug use: Yes    Types: Cocaine, Marijuana    Comment: 11/28/2018    Review of Systems Patient denies headaches, rhinorrhea, blurry vision, numbness, shortness of breath, chest pain, edema, cough, abdominal pain, nausea, vomiting, diarrhea, dysuria, fevers, rashes or hallucinations unless otherwise stated above in HPI. ____________________________________________   PHYSICAL EXAM:  VITAL SIGNS: Vitals:   02/22/19 1430 02/22/19 1500  BP: 112/78 103/68  Pulse: 62 64  Resp: 10 10  Temp:    SpO2: 100% 100%    Constitutional: Alert and oriented.  Eyes: Conjunctivae are normal.  Head: Atraumatic. Nose: No congestion/rhinnorhea. Mouth/Throat: Mucous membranes are moist.   Neck: No stridor. Painless ROM.  Cardiovascular: Normal rate, regular rhythm. Grossly normal heart sounds.  Good peripheral circulation. Respiratory: Normal respiratory effort.  No retractions. Lungs CTAB. Gastrointestinal: Soft and nontender. No distention. No abdominal bruits. No  CVA tenderness. Genitourinary:  Musculoskeletal: No lower extremity tenderness nor edema.  No joint effusions. Neurologic:  Normal speech and language. No gross focal neurologic deficits are appreciated. No facial droop Skin:  Skin is warm, dry and intact. No rash noted. Psychiatric: Mood and affect are normal. Speech and behavior are  normal.  ____________________________________________   LABS (all labs ordered are listed, but only abnormal results are displayed)  Results for orders placed or performed during the hospital encounter of 02/22/19 (from the past 24 hour(s))  CBC with Differential/Platelet     Status: Abnormal   Collection Time: 02/22/19 11:36 AM  Result Value Ref Range   WBC 4.9 4.0 - 10.5 K/uL   RBC 4.41 4.22 - 5.81 MIL/uL   Hemoglobin 12.9 (L) 13.0 - 17.0 g/dL   HCT 40.9 (L) 81.1 - 91.4 %   MCV 88.0 80.0 - 100.0 fL   MCH 29.3 26.0 - 34.0 pg   MCHC 33.2 30.0 - 36.0 g/dL   RDW 78.2 95.6 - 21.3 %   Platelets 220 150 - 400 K/uL   nRBC 0.0 0.0 - 0.2 %   Neutrophils Relative % 49 %   Neutro Abs 2.4 1.7 - 7.7 K/uL   Lymphocytes Relative 39 %   Lymphs Abs 1.9 0.7 - 4.0 K/uL   Monocytes Relative 11 %   Monocytes Absolute 0.5 0.1 - 1.0 K/uL   Eosinophils Relative 1 %   Eosinophils Absolute 0.0 0.0 - 0.5 K/uL   Basophils Relative 0 %   Basophils Absolute 0.0 0.0 - 0.1 K/uL   Immature Granulocytes 0 %   Abs Immature Granulocytes 0.00 0.00 - 0.07 K/uL  Comprehensive metabolic panel     Status: Abnormal   Collection Time: 02/22/19 11:36 AM  Result Value Ref Range   Sodium 140 135 - 145 mmol/L   Potassium 3.3 (L) 3.5 - 5.1 mmol/L   Chloride 107 98 - 111 mmol/L   CO2 26 22 - 32 mmol/L   Glucose, Bld 62 (L) 70 - 99 mg/dL   BUN 10 6 - 20 mg/dL   Creatinine, Ser 0.86 0.61 - 1.24 mg/dL   Calcium 8.4 (L) 8.9 - 10.3 mg/dL   Total Protein 6.5 6.5 - 8.1 g/dL   Albumin 3.5 3.5 - 5.0 g/dL   AST 21 15 - 41 U/L   ALT 16 0 - 44 U/L   Alkaline Phosphatase 50 38 - 126 U/L   Total Bilirubin 0.5 0.3 - 1.2 mg/dL   GFR calc non Af Amer >60 >60 mL/min   GFR calc Af Amer >60 >60 mL/min   Anion gap 7 5 - 15  Glucose, capillary     Status: None   Collection Time: 02/22/19 12:19 PM  Result Value Ref Range   Glucose-Capillary 70 70 - 99 mg/dL  Glucose, capillary     Status: Abnormal   Collection Time: 02/22/19   1:38 PM  Result Value Ref Range   Glucose-Capillary 54 (L) 70 - 99 mg/dL  Urine Drug Screen, Qualitative ([facility] only)     Status: Abnormal   Collection Time: 02/22/19  1:50 PM  Result Value Ref Range   Tricyclic, Ur Screen NONE DETECTED NONE DETECTED   Amphetamines, Ur Screen NONE DETECTED NONE DETECTED   MDMA (Ecstasy)Ur Screen NONE DETECTED NONE DETECTED   Cocaine Metabolite,Ur [place] POSITIVE (A) NONE DETECTED   Opiate, Ur Screen NONE DETECTED NONE DETECTED   Phencyclidine (PCP) Ur S NONE DETECTED NONE DETECTED   Cannabinoid 50 Ng,  Ur [place] POSITIVE (A) NONE DETECTED   Barbiturates, Ur Screen NONE DETECTED NONE DETECTED   Benzodiazepine, Ur Scrn POSITIVE (A) NONE DETECTED   Methadone Scn, Ur NONE DETECTED NONE DETECTED  Glucose, capillary     Status: None   Collection Time: 02/22/19  3:12 PM  Result Value Ref Range   Glucose-Capillary 93 70 - 99 mg/dL   ____________________________________________  ED ECG REPORT I, Willy Eddy, the attending physician, personally viewed and interpreted this ECG.   Date: 02/22/2019  EKG Time: 10:47  Rate: 75  Rhythm: sinus  Axis: normal  Intervals: normal intervals  ST&T Change: concave upwards st changes likely BER  ____________________________________________  RADIOLOGY  I personally reviewed all radiographic images ordered to evaluate for the above acute complaints and reviewed radiology reports and findings.  These findings were personally discussed with the patient.  Please see medical record for radiology report.  ____________________________________________   PROCEDURES  Procedure(s) performed:  Marland KitchenMarland KitchenLaceration Repair  Date/Time: 02/22/2019 10:07 AM Performed by: Willy Eddy, MD Authorized by: Willy Eddy, MD   Consent:    Consent obtained:  Verbal   Consent given by:  Patient Anesthesia (see MAR for exact dosages):    Anesthesia method:  None Laceration details:    Location:  Face   Face location:  Forehead    Length (cm):  3   Depth (mm):  1 Repair type:    Repair type:  Simple Exploration:    Contaminated: no   Treatment:    Area cleansed with:  Hibiclens   Amount of cleaning:  Standard   Irrigation solution:  Sterile saline   Visualized foreign bodies/material removed: no   Skin repair:    Repair method:  Tissue adhesive Approximation:    Approximation:  Close Post-procedure details:    Dressing:  Open (no dressing)   Patient tolerance of procedure:  Tolerated well, no immediate complications  .Critical Care Performed by: Willy Eddy, MD Authorized by: Willy Eddy, MD   Critical care provider statement:    Critical care time (minutes):  34   Critical care time was exclusive of:  Separately billable procedures and treating other patients   Critical care was necessary to treat or prevent imminent or life-threatening deterioration of the following conditions:  Toxidrome   Critical care was time spent personally by me on the following activities:  Development of treatment plan with patient or surrogate, discussions with consultants, evaluation of patient's response to treatment, examination of patient, obtaining history from patient or surrogate, ordering and performing treatments and interventions, ordering and review of laboratory studies, ordering and review of radiographic studies, pulse oximetry, re-evaluation of patient's condition and review of old charts      Critical Care performed: yes ____________________________________________   INITIAL IMPRESSION / ASSESSMENT AND PLAN / ED COURSE  Pertinent labs & imaging results that were available during my care of the patient were reviewed by me and considered in my medical decision making (see chart for details).   DDX: laceration, contusion, sdh sah, iph, substance abuse  SEVILLE DOWNS is a 29 y.o. who presents to the ED with injury as described above.  Patient somewhat unreliable historian suspect a component of  drug use.  Cannot clear his clinically therefore will order CT imaging.  Laceration repaired.  Hemodynamically stable.  Remainder of exam is reassuring.   Clinical Course as of Feb 21 1521  Wed Feb 22, 2019  1032 Patient being persistently uncooperative starting to become violent towards police and staff  members.  For his safety and to evaluate him for traumatic injury will provide calming medication.   [PR]  1053 Further clarification with police it sounds like he was being arrested for trespassing and then started having odd behavior becoming aggressive towards other bystanders and then laid in the middle of the road.  Given his history I am concerned that he is acutely psychotic.  As he is requiring multi medications to calm him will evaluate for traumatic injury placed under IVC and if cleared consult psych.   [PR]  1211 Review of records it appears that this is a fairly recurrent pattern of behavior.  Psychiatry will evaluate at bedside but does not feel this requires inpatient admission.  More likely secondary to his persistent substance abuse history.   [PR]  1216 Blood work reassuring but does have borderline low glucose and as he is not drowsy with the calming medication will give half amp of D50 and recheck.   [PR]  3154 Patient now awake tolerating p.o.  He did receive a second amp of D50 while he was drowsy.  Suspect now that he is tolerating p.o. will stabilize.   [PR]  1520 Patient now awake  and is tolerating p.o.  Glucose stable.  Patient will be discharged in police custody.   [PR]    Clinical Course User Index [PR] Merlyn Lot, MD    The patient was evaluated in Emergency Department today for the symptoms described in the history of present illness. He/she was evaluated in the context of the global COVID-19 pandemic, which necessitated consideration that the patient might be at risk for infection with the SARS-CoV-2 virus that causes COVID-19. Institutional protocols and  algorithms that pertain to the evaluation of patients at risk for COVID-19 are in a state of rapid change based on information released by regulatory bodies including the CDC and federal and state organizations. These policies and algorithms were followed during the patient's care in the ED.  As part of my medical decision making, I reviewed the following data within the [place] notes reviewed and incorporated, Labs reviewed, notes from prior ED visits and [place] Controlled Substance Database   ____________________________________________   FINAL CLINICAL IMPRESSION(S) / ED DIAGNOSES  Final diagnoses:  Agitation  Polysubstance abuse ([place])  Hypoglycemia      NEW MEDICATIONS STARTED DURING THIS VISIT:  New Prescriptions   No medications on file     Note:  This document was prepared using Dragon voice recognition software and may include unintentional dictation errors.    Merlyn Lot, MD 02/22/19 [phone]

## 2019-02-22 NOTE — BH Assessment (Signed)
Patient discharged before writer was able to complete TTS Consult. Patient seen by ED Psychiatrist (Dr. Sandrea Hammond) and cleared.

## 2019-02-22 NOTE — ED Notes (Signed)
Pt uncuffed left arm by [employer] officer. [employer] at bedside at this time.

## 2019-02-22 NOTE — ED Triage Notes (Signed)
Patient brought in by EMS in custody of [employer]. Per EMS, patient was arrested by PD, placed in handcuffs and began to hit his head on the hood of the patrol car. Patient was then placed in back of patrol car and started hitting his head on plexiglass in back of car. Patient arrives with laceration to forehead, complaining of blurred vision.   Patient in cuffs attempting to hit his head on sides of bed and kicking at PD. MD at bedside.

## 2019-02-22 NOTE — ED Notes (Signed)
[employer] [place] at bedside. DC signed by [employer] [place].

## 2019-02-22 NOTE — ED Notes (Signed)
This Rn reviewed pt's  DC instructions and follow-up appointments with  [employer] officer Bukner, who verbalized understanding of instructions and follo-up appointments.

## 2019-02-22 NOTE — ED Notes (Signed)
Pt awake and asking for something to eat and for the spit mask to be removed. Pt agrees to control his bodily fluids in cooperation. Pt given a tray of food with mayo and mustard on the sandwich and a sprite to drink per request.

## 2019-02-22 NOTE — ED Notes (Signed)
Pt eating at this time.

## 2019-02-22 NOTE — ED Notes (Signed)
Pt handcuffed, screaming, violent. Laceration noted on left side of head. ED Provider at bedside.

## 2019-03-10 ENCOUNTER — Emergency Department
Admission: EM | Admit: 2019-03-10 | Discharge: 2019-03-10 | Disposition: A | Discharge status: Home / Self Care | Payer: Medicare Other | Attending: Emergency Medicine | Admitting: Emergency Medicine

## 2019-03-10 ENCOUNTER — Emergency Department: Payer: Medicare Other

## 2019-03-10 ENCOUNTER — Other Ambulatory Visit: Payer: Self-pay

## 2019-03-10 DIAGNOSIS — F14120 Cocaine abuse with intoxication, uncomplicated: Secondary | ICD-10-CM | POA: Insufficient documentation

## 2019-03-10 DIAGNOSIS — Y92410 Unspecified street and highway as the place of occurrence of the external cause: Secondary | ICD-10-CM | POA: Insufficient documentation

## 2019-03-10 DIAGNOSIS — F17200 Nicotine dependence, unspecified, uncomplicated: Secondary | ICD-10-CM | POA: Insufficient documentation

## 2019-03-10 DIAGNOSIS — F209 Schizophrenia, unspecified: Secondary | ICD-10-CM | POA: Insufficient documentation

## 2019-03-10 DIAGNOSIS — F14129 Cocaine abuse with intoxication, unspecified: Secondary | ICD-10-CM

## 2019-03-10 DIAGNOSIS — W228XXA Striking against or struck by other objects, initial encounter: Secondary | ICD-10-CM | POA: Insufficient documentation

## 2019-03-10 DIAGNOSIS — S0990XA Unspecified injury of head, initial encounter: Secondary | ICD-10-CM | POA: Diagnosis present

## 2019-03-10 DIAGNOSIS — Y9301 Activity, walking, marching and hiking: Secondary | ICD-10-CM | POA: Diagnosis not present

## 2019-03-10 DIAGNOSIS — S0001XA Abrasion of scalp, initial encounter: Secondary | ICD-10-CM | POA: Diagnosis not present

## 2019-03-10 DIAGNOSIS — Z79899 Other long term (current) drug therapy: Secondary | ICD-10-CM | POA: Diagnosis not present

## 2019-03-10 DIAGNOSIS — Y999 Unspecified external cause status: Secondary | ICD-10-CM | POA: Diagnosis not present

## 2019-03-10 LAB — COMPREHENSIVE METABOLIC PANEL
ALT: 19 U/L (ref 0–44)
AST: 27 U/L (ref 15–41)
Albumin: 3.8 g/dL (ref 3.5–5.0)
Alkaline Phosphatase: 54 U/L (ref 38–126)
Anion gap: 8 (ref 5–15)
BUN: 14 mg/dL (ref 6–20)
CO2: 25 mmol/L (ref 22–32)
Calcium: 9 mg/dL (ref 8.9–10.3)
Chloride: 108 mmol/L (ref 98–111)
Creatinine, Ser: 1.05 mg/dL (ref 0.61–1.24)
GFR calc Af Amer: >60 mL/min (ref >60)
GFR calc non Af Amer: >60 mL/min (ref >60)
Glucose, Bld: 81 mg/dL (ref 70–99)
Potassium: 3.7 mmol/L (ref 3.5–5.1)
Sodium: 141 mmol/L (ref 135–145)
Total Bilirubin: 0.6 mg/dL (ref 0.3–1.2)
Total Protein: 6.7 g/dL (ref 6.5–8.1)

## 2019-03-10 LAB — ACETAMINOPHEN LEVEL: Acetaminophen (Tylenol), Serum: <10 ug/mL — ABNORMAL LOW (ref 10–30)

## 2019-03-10 LAB — CBC WITH DIFFERENTIAL/PLATELET
Abs Immature Granulocytes: 0.01 10*3/uL (ref 0.00–0.07)
Basophils Absolute: 0 10*3/uL (ref 0.0–0.1)
Basophils Relative: 0 %
Eosinophils Absolute: 0 10*3/uL (ref 0.0–0.5)
Eosinophils Relative: 0 %
HCT: 38.4 % — ABNORMAL LOW (ref 39.0–52.0)
Hemoglobin: 13 g/dL (ref 13.0–17.0)
Immature Granulocytes: 0 %
Lymphocytes Relative: 38 %
Lymphs Abs: 1.8 10*3/uL (ref 0.7–4.0)
MCH: 29.4 pg (ref 26.0–34.0)
MCHC: 33.9 g/dL (ref 30.0–36.0)
MCV: 86.9 fL (ref 80.0–100.0)
Monocytes Absolute: 0.4 10*3/uL (ref 0.1–1.0)
Monocytes Relative: 10 %
Neutro Abs: 2.4 10*3/uL (ref 1.7–7.7)
Neutrophils Relative %: 52 %
Platelets: 207 10*3/uL (ref 150–400)
RBC: 4.42 MIL/uL (ref 4.22–5.81)
RDW: 12.4 % (ref 11.5–15.5)
WBC: 4.6 10*3/uL (ref 4.0–10.5)
nRBC: 0 % (ref 0.0–0.2)

## 2019-03-10 LAB — SALICYLATE LEVEL: Salicylate Lvl: <7 mg/dL — ABNORMAL LOW (ref 7.0–30.0)

## 2019-03-10 LAB — ETHANOL: Alcohol, Ethyl (B): <10 mg/dL (ref <10)

## 2019-03-10 NOTE — ED Notes (Signed)
Patients belongings removed including: black jacket, green plaid button up, cross bead necklace, white tshirt, black tshirt, 2 long sleeve black shirts, 1 pair black socks, black sneakers, black underwear briefs, black jeans, black sweatpants, black/grey belt, and a red bandana. Dressed in [employer].

## 2019-03-10 NOTE — ED Notes (Signed)
This RN personally gave pt pamphlet for medication management clinic.

## 2019-03-10 NOTE — ED Triage Notes (Signed)
Pt arrives via [facility] in [employer] custody. Pt was found banging head on telephone poles. Pt has hx of mental health problems and cocaine use 2x week. Pt states he was trying to hurt himself and has been feeling depressed and suicidal since his mom was being mean to him.

## 2019-03-10 NOTE — ED Notes (Addendum)
Called mother for ride per pt request.  She does not want him to be discharged.  Re-discussed with psychiatry and mother informed that it was felt his behavior last night was from substances he was using.  She is concerned about him not having his medication and per dr Amanda Pea he will be given resources to where he can get his medications and that at this time he does not meet criteria to be admitted.  Mother states "well fine just discharge him and I will take him to another [facility] hospital".   Pt remains alert, oriented and cooperative.  Remains to denies SI/HI. Pt agreeable with this plan.

## 2019-03-10 NOTE — ED Notes (Signed)
Pt provided phone number for mother [phone], Foster Simpson.  Updated mother on plan of care and pt status.

## 2019-03-10 NOTE — ED Notes (Signed)
This RN to room to discharge pt, pt dressed and when RN asked to get d/c VS pt began to talk about "everyone picking on me last night and they didn't think I heard them but I did."  Pt continues to talk about being picked on and no one here helping him and that his is going to sue everyone for their jobs.  PT refused to take d/c instructions and refuses to sign d/c.  Pt escorted to lobby where he is met by his ride.

## 2019-03-10 NOTE — ED Provider Notes (Signed)
Patient evaluated by psychiatry no longer felt to be of harm himself.  Likely substance-induced related.  Patient stable for outpatient follow-up   Willy Eddy, MD 03/10/19 1049

## 2019-03-10 NOTE — BH Assessment (Signed)
Writer spoke with the patient to complete an updated/reassessment. Patient denies SI/HI and AV/H.  Patient does not meet inpatient criteria. 

## 2019-03-10 NOTE — ED Notes (Signed)
Patient's belongings packed in bags and placed in B[facility]; see primary nurse note for belongings list

## 2019-03-10 NOTE — ED Provider Notes (Signed)
[facility] Emergency Department Provider Note  ____________________________________________  Time seen: Approximately 5:26 AM  I have reviewed the triage vital signs and the nursing notes.   HISTORY  Chief Complaint Suicidal   HPI [name] is a 29 y.o. male with a history of schizophrenia and polysubstance abuse who was brought in by EMS for agitation under IVC.  EMS was called to the scene by [facility].  Patient was found ambulating in the street, hitting his head onto a car windows parked on the street, confused and agitated.  Patient endorses cocaine use.  Denies any other drug use or alcohol. Patient reports that he was trying to hurt himself.  He is complaining of mild occipital headache from banging his head on cars.   Past Medical History:  Diagnosis Date  . Schizophrenia [facility])     Patient Active Problem List   Diagnosis Date Noted  . Self-inflicted laceration of right wrist (HCC) 11/30/2018  . Suicide ideation 11/29/2018  . Cocaine abuse with cocaine-induced mood disorder (HCC) 11/06/2018  . Agitation   . Schizoaffective disorder, bipolar type (HCC) 11/10/2017  . Cannabis abuse 11/10/2017    History reviewed. No pertinent surgical history.  Prior to Admission medications   Medication Sig Start Date End Date Taking? Authorizing Provider  ARIPiprazole ER (ABILIFY MAINTENA) 400 MG SRER injection Inject 2 mLs (400 mg total) into the muscle every 28 (twenty-eight) days. Next dose is due about December 28, 2018 12/28/18   Clapacs, Jackquline Denmark, MD  benztropine (COGENTIN) 0.5 MG tablet Take 1 tablet (0.5 mg total) by mouth daily. 12/01/18   Clapacs, Jackquline Denmark, MD  divalproex (DEPAKOTE ER) 500 MG 24 hr tablet Take 1 tablet (500 mg total) by mouth daily. 12/01/18   Clapacs, Jackquline Denmark, MD  haloperidol decanoate (HALDOL DECANOATE) 50 MG/ML injection Inject 150 mg into the muscle every 28 (twenty-eight) days.    [provider]  ondansetron  (ZOFRAN ODT) 4 MG disintegrating tablet Take 1 tablet (4 mg total) by mouth every 8 (eight) hours as needed for nausea or vomiting. 02/16/19   Sharman Cheek, MD    Allergies Ibuprofen, Risperidone and related, Ziprasidone, and Quetiapine  No family history on file.  Social History Social History   Tobacco Use  . Smoking status: Current Every Day Smoker  . Smokeless tobacco: Never Used  Substance Use Topics  . Alcohol use: Yes  . Drug use: Yes    Types: Cocaine, Marijuana    Comment: 11/28/2018    Review of Systems  Constitutional: Negative for fever. Eyes: Negative for visual changes. ENT: Negative for sore throat. Neck: No neck pain  Cardiovascular: Negative for chest pain. Respiratory: Negative for shortness of breath. Gastrointestinal: Negative for abdominal pain, vomiting or diarrhea. Genitourinary: Negative for dysuria. Musculoskeletal: Negative for back pain. Skin: Negative for rash. Neurological: Negative for  weakness or numbness. + HA Psych: + SI and agitation. No HI  ____________________________________________   PHYSICAL EXAM:  VITAL SIGNS: Vitals:   03/10/19 0531  BP: 127/83  Pulse: 95  Resp: 14  Temp: 98.4 F (36.9 C)  SpO2: 100%    Constitutional: Alert and oriented, intoxicated.  HEENT:      Head: Normocephalic.  Tender to palpation to occipital region with a small abrasion but no laceration         Eyes: Conjunctivae are normal. Sclera is non-icteric.       Mouth/Throat: Mucous membranes are moist.       Neck:  Supple with no signs of meningismus. Cardiovascular: Regular rate and rhythm.  Respiratory: Normal respiratory effort.  Gastrointestinal: Soft, non tender, and non distended. Musculoskeletal: No edema, cyanosis, or erythema of extremities. Neurologic: Normal speech and language. Face is symmetric. Moving all extremities. No gross focal neurologic deficits are appreciated. Skin: Skin is warm, dry and intact. No rash noted.  Psychiatric: Mood and affect are depressed. Intoxicated but cooperative  ____________________________________________   LABS (all labs ordered are listed, but only abnormal results are displayed)  Labs Reviewed  CBC WITH DIFFERENTIAL/PLATELET - Abnormal; Notable for the following components:      Result Value   HCT 38.4 (*)    All other components within normal limits  SALICYLATE LEVEL - Abnormal; Notable for the following components:   Salicylate Lvl <9.9 (*)    All other components within normal limits  ACETAMINOPHEN LEVEL - Abnormal; Notable for the following components:   Acetaminophen (Tylenol), Serum <10 (*)    All other components within normal limits  COMPREHENSIVE METABOLIC PANEL  ETHANOL  URINE DRUG SCREEN, QUALITATIVE ([facility] ONLY)   ____________________________________________  EKG  none  ____________________________________________  RADIOLOGY  I have personally reviewed the images performed during this visit and I agree with the Radiologist's read.   Interpretation by Radiologist:  CT Head Wo Contrast  Result Date: 03/10/2019 CLINICAL DATA:  Head trauma. Headache. Patient was hitting head on telephone pole. EXAM: CT HEAD WITHOUT CONTRAST TECHNIQUE: Contiguous axial images were obtained from the base of the skull through the vertex without intravenous contrast. COMPARISON:  CT head without contrast 02/22/19 FINDINGS: Brain: No acute infarct, hemorrhage, or mass lesion is present. The ventricles are of normal size. No significant white matter lesions are present. No significant extraaxial fluid collection is present. Vascular: No hyperdense vessel or unexpected calcification. Skull: Calvarium is intact. No focal lytic or blastic lesions are present. No significant extracranial soft tissue lesion is present. Sinuses/Orbits: The paranasal sinuses and mastoid air cells are clear. Prominent cerumen is noted in the external auditory canals bilaterally. The globes and orbits  are within normal limits. IMPRESSION: Negative CT of the head. Electronically Signed   By: San Morelle M.D.   On: 03/10/2019 06:18      ____________________________________________   PROCEDURES  Procedure(s) performed: None Procedures Critical Care performed:  None ____________________________________________   INITIAL IMPRESSION / ASSESSMENT AND PLAN / ED COURSE  29 y.o. male with a history of schizophrenia and polysubstance abuse who was brought in by EMS for agitation.  Patient is intoxicated, endorses doing cocaine.  Was hitting his head onto cars in an attempt to hurt himself.  Will remain under IVC.  Will do a head CT to allow intracranial injury.  He has an abrasion with some dried blood in the occipital part of his head but no obvious laceration, no CT and L-spine tenderness.  No other signs of trauma.  Will consult psychiatry.  Will check basic labs for medical clearance.    _________________________ 6:22 AM on 03/10/2019 -----------------------------------------  Head CT negative.  Labs with no acute findings.  Patient is medically clear for psych evaluation    Please note:  Patient was evaluated in Emergency Department today for the symptoms described in the history of present illness. Patient was evaluated in the context of the global COVID-19 pandemic, which necessitated consideration that the patient might be at risk for infection with the SARS-CoV-2 virus that causes COVID-19. Institutional protocols and algorithms that pertain to the evaluation of patients at risk  for COVID-19 are in a state of rapid change based on information released by regulatory bodies including the CDC and federal and state organizations. These policies and algorithms were followed during the patient's care in the ED.  Some ED evaluations and interventions may be delayed as a result of limited staffing during the pandemic.   As part of my medical decision making, I reviewed the following  data within the electronic MEDICAL RECORD NUMBER Nursing notes reviewed and incorporated, Labs reviewed , Old chart reviewed, Radiograph reviewed , A consult was requested and obtained from this/these consultant(s) psychiatry, Notes from prior ED visits and [place] Controlled Substance Database   ____________________________________________   FINAL CLINICAL IMPRESSION(S) / ED DIAGNOSES   Final diagnoses:  Cocaine abuse with intoxication (HCC)  Schizophrenia, unspecified type (HCC)  Abrasion of scalp, initial encounter      NEW MEDICATIONS STARTED DURING THIS VISIT:  ED Discharge Orders    None       Note:  This document was prepared using Dragon voice recognition software and may include unintentional dictation errors.    Don Perking, [place], MD 03/10/19 [phone]

## 2019-03-10 NOTE — ED Notes (Signed)
Report received from [facility]. Pt in room 23 now.  Under IVC.  Waiting on psych assessment.

## 2019-03-10 NOTE — Consult Note (Signed)
[facility] Face-to-Face Psychiatry Consult   Reason for Consult: Reports of SI Referring Physician: Dr. Roxan Hockey Patient Identification: [name] MRN:  [id number] Principal Diagnosis: <principal problem not specified> Diagnosis:  Active Problems:   * No active hospital problems. *   Total Time spent with patient: 30 minutes  Subjective:   [name] is a 29 y.o. male patient admitted under IVC for reports of banging his head.  HPI: Patient is a 29 year old male with a past psychiatric history of schizophrenia and polysubstance abuse who presents under IVC due to being found in the community with bizarre behavior and banging his head.  Patient is well-known to service as he often presents intoxicated and after given time to sober up will no longer acknowledge any psychiatric symptoms.  Today was no different as upon evaluation patient woke up was calm and compliant.  He denied any SI and explained his symptoms is due to his intoxication on cocaine.  Patient endorsed that he would like to get back on his medications was given appropriate outpatient resources in order to be able to do so.  No psych ptosis noted.  No mania noted.  Patient denied any suicidal or homicidal ideation.  Past Psychiatric History: She has multiple presentations and previous hospital admissions for bizarre behavior.  Most recently has been noted that this is almost always in the context of drug abuse as patient is quick to feel better after periods of abstinence.  Risk to Self:  No Risk to Others:  No Prior Inpatient Therapy:  Yes Prior Outpatient Therapy:  Yes  Past Medical History:  Past Medical History:  Diagnosis Date  . Schizophrenia (HCC)    History reviewed. No pertinent surgical history. Family History: No family history on file. Family Psychiatric  History: Denies Social History:  Social History   Substance and Sexual Activity  Alcohol Use Yes     Social History   Substance and Sexual  Activity  Drug Use Yes  . Types: Cocaine, Marijuana   Comment: 11/28/2018    Social History   Socioeconomic History  . Marital status: Single    Spouse name: Not on file  . Number of children: Not on file  . Years of education: Not on file  . Highest education level: Not on file  Occupational History  . Not on file  Tobacco Use  . Smoking status: Current Every Day Smoker  . Smokeless tobacco: Never Used  Substance and Sexual Activity  . Alcohol use: Yes  . Drug use: Yes    Types: Cocaine, Marijuana    Comment: 11/28/2018  . Sexual activity: Not on file  Other Topics Concern  . Not on file  Social History Narrative  . Not on file   Social Determinants of Health   Financial Resource Strain:   . Difficulty of Paying Living Expenses: Not on file  Food Insecurity:   . Worried About Programme researcher, broadcasting/film/video in the Last Year: Not on file  . Ran Out of Food in the Last Year: Not on file  Transportation Needs:   . Lack of Transportation (Medical): Not on file  . Lack of Transportation (Non-Medical): Not on file  Physical Activity:   . Days of Exercise per Week: Not on file  . Minutes of Exercise per Session: Not on file  Stress:   . Feeling of Stress : Not on file  Social Connections:   . Frequency of Communication with Friends and Family: Not on file  .  Frequency of Social Gatherings with Friends and Family: Not on file  . Attends Religious Services: Not on file  . Active Member of Clubs or Organizations: Not on file  . Attends Banker Meetings: Not on file  . Marital Status: Not on file   Additional Social History:    Allergies:   Allergies  Allergen Reactions  . Ibuprofen Swelling    Tongue swelling  . Risperidone And Related Other (See Comments) and Swelling    gynecomastia gynecomastia   . Ziprasidone Swelling    Tongue swells  . Quetiapine Other (See Comments)    Depression, suicidality, adverse effect: seizures     Labs:  Results for orders  placed or performed during the hospital encounter of 03/10/19 (from the past 48 hour(s))  CBC with Differential/Platelet     Status: Abnormal   Collection Time: 03/10/19  5:45 AM  Result Value Ref Range   WBC 4.6 4.0 - 10.5 K/uL   RBC 4.42 4.22 - 5.81 MIL/uL   Hemoglobin 13.0 13.0 - 17.0 g/dL   HCT 95.2 (L) 84.1 - 32.4 %   MCV 86.9 80.0 - 100.0 fL   MCH 29.4 26.0 - 34.0 pg   MCHC 33.9 30.0 - 36.0 g/dL   RDW 40.1 02.7 - 25.3 %   Platelets 207 150 - 400 K/uL   nRBC 0.0 0.0 - 0.2 %   Neutrophils Relative % 52 %   Neutro Abs 2.4 1.7 - 7.7 K/uL   Lymphocytes Relative 38 %   Lymphs Abs 1.8 0.7 - 4.0 K/uL   Monocytes Relative 10 %   Monocytes Absolute 0.4 0.1 - 1.0 K/uL   Eosinophils Relative 0 %   Eosinophils Absolute 0.0 0.0 - 0.5 K/uL   Basophils Relative 0 %   Basophils Absolute 0.0 0.0 - 0.1 K/uL   Immature Granulocytes 0 %   Abs Immature Granulocytes 0.01 0.00 - 0.07 K/uL    Comment: Performed at [facility], [address] [place], [place] [address]  Comprehensive metabolic panel     Status: None   Collection Time: 03/10/19  5:45 AM  Result Value Ref Range   Sodium 141 135 - 145 mmol/L   Potassium 3.7 3.5 - 5.1 mmol/L   Chloride 108 98 - 111 mmol/L   CO2 25 22 - 32 mmol/L   Glucose, Bld 81 70 - 99 mg/dL    Comment: Glucose reference range applies only to samples taken after fasting for at least 8 hours.   BUN 14 6 - 20 mg/dL   Creatinine, Ser 3.47 0.61 - 1.24 mg/dL   Calcium 9.0 8.9 - 42.5 mg/dL   Total Protein 6.7 6.5 - 8.1 g/dL   Albumin 3.8 3.5 - 5.0 g/dL   AST 27 15 - 41 U/L   ALT 19 0 - 44 U/L   Alkaline Phosphatase 54 38 - 126 U/L   Total Bilirubin 0.6 0.3 - 1.2 mg/dL   GFR calc non Af Amer >60 >60 mL/min   GFR calc Af Amer >60 >60 mL/min   Anion gap 8 5 - 15    Comment: Performed at [facility], [address] [place], [place] [address]  Ethanol     Status: None   Collection Time: 03/10/19  5:45 AM  Result Value Ref Range   Alcohol,  Ethyl (B) <10 <10 mg/dL    Comment: (NOTE) Lowest detectable limit for serum alcohol is 10 mg/dL. For medical purposes only. Performed at [facility], [place], [place] [address]   Salicylate level     Status: Abnormal   Collection Time: 03/10/19  5:45 AM  Result Value Ref Range   Salicylate Lvl <7.9 (L) 7.0 - 30.0 mg/dL    Comment: Performed at [facility], [address] [place], [place] [address]  Acetaminophen level     Status: Abnormal   Collection Time: 03/10/19  5:45 AM  Result Value Ref Range   Acetaminophen (Tylenol), Serum <10 (L) 10 - 30 ug/mL    Comment: (NOTE) Therapeutic concentrations vary significantly. A range of 10-30 ug/mL  may be an effective concentration for many patients. However, some  are best treated at concentrations outside of this range. Acetaminophen concentrations >150 ug/mL at 4 hours after ingestion  and >50 ug/mL at 12 hours after ingestion are often associated with  toxic reactions. Performed at [facility], [address] [place], [place] [address]     No current facility-administered medications for this encounter.   Current Outpatient Medications  Medication Sig Dispense Refill  . ARIPiprazole ER (ABILIFY MAINTENA) 400 MG SRER injection Inject 2 mLs (400 mg total) into the muscle every 28 (twenty-eight) days. Next dose is due about December 28, 2018 1 each 1  . benztropine (COGENTIN) 0.5 MG tablet Take 1 tablet (0.5 mg total) by mouth daily. 30 tablet 1  . divalproex (DEPAKOTE ER) 500 MG 24 hr tablet Take 1 tablet (500 mg total) by mouth daily. 30 tablet 1  . haloperidol decanoate (HALDOL DECANOATE) 50 MG/ML injection Inject 150 mg into the muscle every 28 (twenty-eight) days.    . ondansetron (ZOFRAN ODT) 4 MG disintegrating tablet Take 1 tablet (4 mg total) by mouth every 8 (eight) hours as needed for nausea or vomiting. 20 tablet 0    Musculoskeletal: Strength & Muscle Tone: within  normal limits Gait & Station: normal Patient leans: N/A  Psychiatric Specialty Exam: Physical Exam  Review of Systems  Psychiatric/Behavioral: Positive for self-injury. Negative for agitation, hallucinations and suicidal ideas.    Blood pressure 103/61, pulse 82, temperature 98.4 F (36.9 C), temperature source Oral, resp. rate 18, height 6' (1.829 m), weight 68 kg, SpO2 95 %.Body mass index is 20.34 kg/m.  General Appearance: Casual  Eye Contact:  Good  Speech:  Clear and Coherent  Volume:  Normal  Mood:  Euthymic  Affect:  Congruent  Thought Process:  Coherent  Orientation:  Full (Time, Place, and Person)  Thought Content:  Logical  Suicidal Thoughts:  No  Homicidal Thoughts:  No  Memory:  Recent;   Fair  Judgement:  Intact  Insight:  Fair  Psychomotor Activity:  Normal  Concentration:  Concentration: Fair  Recall:  [employer] of Knowledge:  Fair  Language:  Fair  Akathisia:  No  Handed:  Right  AIMS (if indicated):     Assets:  Communication Skills Desire for Improvement Leisure Time Physical Health  ADL's:  Intact  Cognition:  WNL  Sleep:        Treatment Plan Summary: 29 year old male with history of schizophrenia polysubstance abuse presenting intoxicated on cocaine.  Upon reevaluation patient is sober no longer endorsing any psychiatric symptoms.  Does not meet criteria for inpatient hospitalization and will be discharged.  IVC rescinded  Disposition: Patient does not meet criteria for psychiatric inpatient admission.  Dixie Dials, MD 03/10/2019 11:36 AM

## 2019-03-10 NOTE — ED Notes (Signed)
Pt's mother called, yelling at nurse and secretary "I want to know if my son is okay!" Explained that pt is asleep at this time and nurse has to obtain permission from pt to speak to her. Mother became more agitated, yelling again. Stated that police had "tied my son up." Explained that pt is safe and not "tied up." Mother hung up on nurse.

## 2019-03-10 NOTE — ED Notes (Signed)
Offered pt phone to call his mom if he wanted.  Pt requests this RN call his mom and update her. He verbalized permission to give any and all information and that she is aware of his drug use.  Pt is alert and oriented X4

## 2019-03-12 ENCOUNTER — Emergency Department
Admission: EM | Admit: 2019-03-12 | Discharge: 2019-03-13 | Disposition: A | Discharge status: Home / Self Care | Payer: Medicare Other | Attending: Emergency Medicine | Admitting: Emergency Medicine

## 2019-03-12 ENCOUNTER — Other Ambulatory Visit: Payer: Self-pay

## 2019-03-12 DIAGNOSIS — F17200 Nicotine dependence, unspecified, uncomplicated: Secondary | ICD-10-CM | POA: Diagnosis not present

## 2019-03-12 DIAGNOSIS — F19950 Other psychoactive substance use, unspecified with psychoactive substance-induced psychotic disorder with delusions: Secondary | ICD-10-CM | POA: Diagnosis present

## 2019-03-12 DIAGNOSIS — Z79899 Other long term (current) drug therapy: Secondary | ICD-10-CM | POA: Diagnosis not present

## 2019-03-12 DIAGNOSIS — F200 Paranoid schizophrenia: Secondary | ICD-10-CM | POA: Diagnosis not present

## 2019-03-12 DIAGNOSIS — F209 Schizophrenia, unspecified: Secondary | ICD-10-CM

## 2019-03-12 DIAGNOSIS — R45851 Suicidal ideations: Secondary | ICD-10-CM | POA: Insufficient documentation

## 2019-03-12 DIAGNOSIS — F919 Conduct disorder, unspecified: Secondary | ICD-10-CM | POA: Diagnosis present

## 2019-03-12 LAB — COMPREHENSIVE METABOLIC PANEL WITH GFR
ALT: 19 U/L (ref 0–44)
AST: 25 U/L (ref 15–41)
Albumin: 4.1 g/dL (ref 3.5–5.0)
Alkaline Phosphatase: 59 U/L (ref 38–126)
Anion gap: 7 (ref 5–15)
BUN: 20 mg/dL (ref 6–20)
CO2: 26 mmol/L (ref 22–32)
Calcium: 9.1 mg/dL (ref 8.9–10.3)
Chloride: 107 mmol/L (ref 98–111)
Creatinine, Ser: 1.13 mg/dL (ref 0.61–1.24)
GFR calc Af Amer: >60 mL/min
GFR calc non Af Amer: >60 mL/min
Glucose, Bld: 98 mg/dL (ref 70–99)
Potassium: 3.7 mmol/L (ref 3.5–5.1)
Sodium: 140 mmol/L (ref 135–145)
Total Bilirubin: 0.8 mg/dL (ref 0.3–1.2)
Total Protein: 7.1 g/dL (ref 6.5–8.1)

## 2019-03-12 LAB — CBC
HCT: 40.2 % (ref 39.0–52.0)
Hemoglobin: 13.6 g/dL (ref 13.0–17.0)
MCH: 29.6 pg (ref 26.0–34.0)
MCHC: 33.8 g/dL (ref 30.0–36.0)
MCV: 87.4 fL (ref 80.0–100.0)
Platelets: 234 10*3/uL (ref 150–400)
RBC: 4.6 MIL/uL (ref 4.22–5.81)
RDW: 12.8 % (ref 11.5–15.5)
WBC: 5 10*3/uL (ref 4.0–10.5)
nRBC: 0 % (ref 0.0–0.2)

## 2019-03-12 LAB — ACETAMINOPHEN LEVEL: Acetaminophen (Tylenol), Serum: <10 ug/mL — ABNORMAL LOW (ref 10–30)

## 2019-03-12 LAB — ETHANOL: Alcohol, Ethyl (B): <10 mg/dL

## 2019-03-12 LAB — SALICYLATE LEVEL: Salicylate Lvl: <7 mg/dL — ABNORMAL LOW (ref 7.0–30.0)

## 2019-03-12 MED ORDER — HALOPERIDOL 5 MG PO TABS
5.0000 mg | ORAL_TABLET | Freq: Two times a day (BID) | ORAL | Status: DC
  Administered 2019-03-12 – 2019-03-13 (×2): 5 mg via ORAL
  Filled 2019-03-12 (×2): qty 1

## 2019-03-12 MED ORDER — BENZTROPINE MESYLATE 1 MG PO TABS
1.0000 mg | ORAL_TABLET | Freq: Every day | ORAL | Status: DC
  Administered 2019-03-13: 1 mg via ORAL
  Filled 2019-03-12 (×2): qty 1

## 2019-03-12 MED ORDER — ZIPRASIDONE MESYLATE 20 MG IM SOLR
20.0000 mg | Freq: Once | INTRAMUSCULAR | Status: AC
  Administered 2019-03-12: 18:00:00 20 mg via INTRAMUSCULAR

## 2019-03-12 MED ORDER — DIVALPROEX SODIUM 500 MG PO DR TAB
500.0000 mg | DELAYED_RELEASE_TABLET | Freq: Two times a day (BID) | ORAL | Status: DC
  Administered 2019-03-12 – 2019-03-13 (×2): 500 mg via ORAL
  Filled 2019-03-12 (×2): qty 1

## 2019-03-12 NOTE — ED Notes (Signed)
Hourly rounding reveals patient sleeping in room. No complaints, stable, in no acute distress. Q15 minute rounds and monitoring via Rover and Officer to continue.  
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
## 2019-03-12 NOTE — ED Notes (Signed)
Pt agitated that medications were ordered for him - he is demanding to go home and demanding to speak to doctor - this nurse spoke with Jerilynn Som TTS and he reports that pt is IVC'd and staying in the hospital - pt informed and increasingly agitated/refused medication

## 2019-03-12 NOTE — ED Notes (Signed)
Pt had cell phone with cracked screen, 20 dollar bill, 5 dollar bill, and debit card locked up by security

## 2019-03-12 NOTE — ED Notes (Signed)
Pt given supper tray Pt is much calmer and not agitated at this time

## 2019-03-12 NOTE — ED Notes (Signed)
Pt given sandwich tray with coke

## 2019-03-12 NOTE — ED Notes (Signed)
Mother calling to speak with pt - advised mother that pt was agitated and would not be allowed any further calls this afternoon - pt became more agitated after speaking to mother earlier

## 2019-03-12 NOTE — ED Notes (Signed)
IVC prior to arrival/ Consult ordered/Completed/ Pending Admit when medically clear
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
## 2019-03-12 NOTE — ED Notes (Signed)
Patient up to rest room. Assisted back to bed since he's drowsy. Denies SI, HI and AVH.

## 2019-03-12 NOTE — ED Notes (Signed)
Report to include situation, background, assessment and recommendations from [facility]. Patient sleeping, respirations regular and unlabored. Q15 minute rounds and officer observation to continue.

## 2019-03-13 DIAGNOSIS — F200 Paranoid schizophrenia: Secondary | ICD-10-CM | POA: Diagnosis not present

## 2019-03-13 MED ORDER — DIVALPROEX SODIUM 500 MG PO DR TAB: 500.0000 mg | DELAYED_RELEASE_TABLET | Freq: Two times a day (BID) | ORAL | 0 refills | Status: DC

## 2019-03-13 MED ORDER — HALOPERIDOL 5 MG PO TABS: 5.0000 mg | ORAL_TABLET | Freq: Two times a day (BID) | ORAL | 0 refills | Status: DC

## 2019-03-13 MED ORDER — BENZTROPINE MESYLATE 1 MG PO TABS: 1.0000 mg | ORAL_TABLET | Freq: Every day | ORAL | 0 refills | Status: DC

## 2019-03-13 NOTE — ED Provider Notes (Signed)
-----------------------------------------   6:27 AM on 03/13/2019 -----------------------------------------   Blood pressure (!) 105/55, pulse 77, resp. rate 18, height 6' (1.829 m), weight 77.1 kg, SpO2 100 %.  The patient is sleeping at this time.  There have been no acute events since the last update.  Awaiting disposition plan from Behavioral Medicine and/or Social Work team(s).   Irean Hong, MD 03/13/19 [phone]

## 2019-03-13 NOTE — ED Notes (Signed)
Hourly rounding reveals patient sleeping in room. No complaints, stable, in no acute distress. Q15 minute rounds and monitoring via Rover and Officer to continue.  

## 2019-03-13 NOTE — ED Notes (Signed)
Referral information for Psychiatric Hospitalization faxed to;   [facility] [phone]- [phone]),   [facility] ([phone]--[phone])  [facility] [phone] & [phone])  [facility] [phone]),  [facility] [phone], [phone], [phone] or [phone]),   [facility] [phone] or [phone])  [facility] [phone]),   [facility] [phone] -or- [phone]),   [facility] [phone] or [phone])  [facility] [phone] or [phone]),   [facility] [phone])

## 2019-03-13 NOTE — ED Notes (Signed)
IVC PAPERS  RESCINDED  INFORMED  [name]  TEAGUE  RN 
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
## 2019-03-13 NOTE — ED Provider Notes (Signed)
2:05 PM Patient has been seen and evaluated by psychiatry team and deemed appropriate for discharge. Patient determined not to be a threat to themselves or others. IVC rescinded by psychiatry team. Rx sent by psychiatry team. Patient is otherwise medically clear for discharge.      Miguel Aschoff., MD 03/13/19 [phone]

## 2019-03-13 NOTE — Discharge Instructions (Addendum)
Thank you for letting us take care of you in the emergency department today.   Please continue to take any regular, prescribed medications.   Please follow up with: - Your primary psychiatry doctor to review your ER visit and follow up on your symptoms.   Please return to the ER for any new or worsening symptoms.

## 2019-03-22 ENCOUNTER — Emergency Department
Admission: EM | Admit: 2019-03-22 | Discharge: 2019-03-22 | Disposition: A | Discharge status: Home / Self Care | Payer: Medicare Other | Attending: Emergency Medicine | Admitting: Emergency Medicine

## 2019-03-22 ENCOUNTER — Other Ambulatory Visit: Payer: Self-pay

## 2019-03-22 DIAGNOSIS — F1210 Cannabis abuse, uncomplicated: Secondary | ICD-10-CM | POA: Insufficient documentation

## 2019-03-22 DIAGNOSIS — R45851 Suicidal ideations: Secondary | ICD-10-CM | POA: Insufficient documentation

## 2019-03-22 DIAGNOSIS — F1410 Cocaine abuse, uncomplicated: Secondary | ICD-10-CM | POA: Insufficient documentation

## 2019-03-22 DIAGNOSIS — F17200 Nicotine dependence, unspecified, uncomplicated: Secondary | ICD-10-CM | POA: Diagnosis not present

## 2019-03-22 DIAGNOSIS — F209 Schizophrenia, unspecified: Secondary | ICD-10-CM | POA: Diagnosis not present

## 2019-03-22 DIAGNOSIS — F1910 Other psychoactive substance abuse, uncomplicated: Secondary | ICD-10-CM | POA: Diagnosis not present

## 2019-03-22 DIAGNOSIS — Z046 Encounter for general psychiatric examination, requested by authority: Secondary | ICD-10-CM | POA: Diagnosis present

## 2019-03-22 LAB — CBC WITH DIFFERENTIAL/PLATELET
Abs Immature Granulocytes: 0.01 10*3/uL (ref 0.00–0.07)
Basophils Absolute: 0 10*3/uL (ref 0.0–0.1)
Basophils Relative: 0 %
Eosinophils Absolute: 0 10*3/uL (ref 0.0–0.5)
Eosinophils Relative: 0 %
HCT: 40.4 % (ref 39.0–52.0)
Hemoglobin: 13.7 g/dL (ref 13.0–17.0)
Immature Granulocytes: 0 %
Lymphocytes Relative: 40 %
Lymphs Abs: 2.5 10*3/uL (ref 0.7–4.0)
MCH: 29.5 pg (ref 26.0–34.0)
MCHC: 33.9 g/dL (ref 30.0–36.0)
MCV: 86.9 fL (ref 80.0–100.0)
Monocytes Absolute: 0.4 10*3/uL (ref 0.1–1.0)
Monocytes Relative: 7 %
Neutro Abs: 3.3 10*3/uL (ref 1.7–7.7)
Neutrophils Relative %: 53 %
Platelets: 220 10*3/uL (ref 150–400)
RBC: 4.65 MIL/uL (ref 4.22–5.81)
RDW: 12.3 % (ref 11.5–15.5)
WBC: 6.2 10*3/uL (ref 4.0–10.5)
nRBC: 0 % (ref 0.0–0.2)

## 2019-03-22 LAB — COMPREHENSIVE METABOLIC PANEL
ALT: 14 U/L (ref 0–44)
AST: 16 U/L (ref 15–41)
Albumin: 4 g/dL (ref 3.5–5.0)
Alkaline Phosphatase: 57 U/L (ref 38–126)
Anion gap: 7 (ref 5–15)
BUN: 19 mg/dL (ref 6–20)
CO2: 24 mmol/L (ref 22–32)
Calcium: 8.6 mg/dL — ABNORMAL LOW (ref 8.9–10.3)
Chloride: 107 mmol/L (ref 98–111)
Creatinine, Ser: 0.86 mg/dL (ref 0.61–1.24)
GFR calc Af Amer: >60 mL/min (ref >60)
GFR calc non Af Amer: >60 mL/min (ref >60)
Glucose, Bld: 98 mg/dL (ref 70–99)
Potassium: 3.5 mmol/L (ref 3.5–5.1)
Sodium: 138 mmol/L (ref 135–145)
Total Bilirubin: 0.8 mg/dL (ref 0.3–1.2)
Total Protein: 7 g/dL (ref 6.5–8.1)

## 2019-03-22 LAB — ETHANOL: Alcohol, Ethyl (B): <10 mg/dL (ref <10)

## 2019-03-22 LAB — SALICYLATE LEVEL: Salicylate Lvl: <7 mg/dL — ABNORMAL LOW (ref 7.0–30.0)

## 2019-03-22 LAB — ACETAMINOPHEN LEVEL: Acetaminophen (Tylenol), Serum: <10 ug/mL — ABNORMAL LOW (ref 10–30)

## 2019-03-22 MED ORDER — ACETAMINOPHEN 500 MG PO TABS: 1000.0000 mg | ORAL_TABLET | Freq: Once | ORAL | Status: DC

## 2019-03-22 NOTE — ED Notes (Signed)
Pt given belongings and reviewed discharge paperwork. Pt then walked to exit with no issue.
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
## 2019-03-22 NOTE — ED Notes (Signed)
Pt currently resting in bed eating meal tray at this time, pt visualized in NAD.

## 2019-03-22 NOTE — ED Notes (Signed)
IVC  RESCINDED  INFORMED  RN  DEE

## 2019-03-22 NOTE — Consult Note (Signed)
[facility] Face-to-Face Psychiatry Consult   Reason for Consult: Bizarre behavior and SI Referring Physician: Dr. Fanny Bien Patient Identification: [name] MRN:  [id number] Principal Diagnosis: <principal problem not specified> Diagnosis:  Active Problems:   * No active hospital problems. *   Total Time spent with patient: 30 minutes  Subjective:   [name] is a 29 y.o. male patient admitted bizarre behavior and SI.Marland Kitchen  HPI: Patient is well-known to Clinical research associate as he presents often with complaints of SI or psychotic-like behavior.  Patient will typically present this way after using substances.  Recently patient has been using crack cocaine and last night's presentation is no different.  Patient states that he smoked crack cocaine last night and then was unsure of the events following.  This morning he is denying any suicidal or homicidal ideation.  He denies any psychotic symptoms.  He is able to reasonably explain his presentation is due to his drug problem.  Patient was informed once again that his drug problem has continued to cause issues for him and his interpersonal life as well as legal problems.  Patient is acknowledging this and thankful to treatment team for allowing him to stay the night.  Patient at this time is denying any psychotic symptoms or psychiatric problems.  He does have medications available to him at home and appropriate psychiatric follow-up.   Past Psychiatric History: Patient has a diagnosis of schizophrenia.  Risk to Self:  No Risk to Others:  No Prior Inpatient Therapy:  Yes Prior Outpatient Therapy:  Yes  Past Medical History:  Past Medical History:  Diagnosis Date  . Schizophrenia (HCC)    History reviewed. No pertinent surgical history. Family History: History reviewed. No pertinent family history. Family Psychiatric  History: denies Social History:  Social History   Substance and Sexual Activity  Alcohol Use Yes     Social History   Substance and  Sexual Activity  Drug Use Yes  . Types: Cocaine, Marijuana   Comment: 11/28/2018    Social History   Socioeconomic History  . Marital status: Single    Spouse name: Not on file  . Number of children: Not on file  . Years of education: Not on file  . Highest education level: Not on file  Occupational History  . Not on file  Tobacco Use  . Smoking status: Current Every Day Smoker  . Smokeless tobacco: Never Used  Substance and Sexual Activity  . Alcohol use: Yes  . Drug use: Yes    Types: Cocaine, Marijuana    Comment: 11/28/2018  . Sexual activity: Not on file  Other Topics Concern  . Not on file  Social History Narrative  . Not on file   Social Determinants of Health   Financial Resource Strain:   . Difficulty of Paying Living Expenses: Not on file  Food Insecurity:   . Worried About Programme researcher, broadcasting/film/video in the Last Year: Not on file  . Ran Out of Food in the Last Year: Not on file  Transportation Needs:   . Lack of Transportation (Medical): Not on file  . Lack of Transportation (Non-Medical): Not on file  Physical Activity:   . Days of Exercise per Week: Not on file  . Minutes of Exercise per Session: Not on file  Stress:   . Feeling of Stress : Not on file  Social Connections:   . Frequency of Communication with Friends and Family: Not on file  . Frequency of Social Gatherings with  Friends and Family: Not on file  . Attends Religious Services: Not on file  . Active Member of Clubs or Organizations: Not on file  . Attends Banker Meetings: Not on file  . Marital Status: Not on file   Additional Social History:    Allergies:   Allergies  Allergen Reactions  . Ibuprofen Swelling    Tongue swelling  . Risperidone And Related Other (See Comments) and Swelling    gynecomastia gynecomastia   . Ziprasidone Swelling    Tongue swells  . Quetiapine Other (See Comments)    Depression, suicidality, adverse effect: seizures     Labs:  Results for  orders placed or performed during the hospital encounter of 03/22/19 (from the past 48 hour(s))  Comprehensive metabolic panel     Status: Abnormal   Collection Time: 03/22/19  4:33 AM  Result Value Ref Range   Sodium 138 135 - 145 mmol/L   Potassium 3.5 3.5 - 5.1 mmol/L   Chloride 107 98 - 111 mmol/L   CO2 24 22 - 32 mmol/L   Glucose, Bld 98 70 - 99 mg/dL    Comment: Glucose reference range applies only to samples taken after fasting for at least 8 hours.   BUN 19 6 - 20 mg/dL   Creatinine, Ser 2.13 0.61 - 1.24 mg/dL   Calcium 8.6 (L) 8.9 - 10.3 mg/dL   Total Protein 7.0 6.5 - 8.1 g/dL   Albumin 4.0 3.5 - 5.0 g/dL   AST 16 15 - 41 U/L   ALT 14 0 - 44 U/L   Alkaline Phosphatase 57 38 - 126 U/L   Total Bilirubin 0.8 0.3 - 1.2 mg/dL   GFR calc non Af Amer >60 >60 mL/min   GFR calc Af Amer >60 >60 mL/min   Anion gap 7 5 - 15    Comment: Performed at [facility], [address], [place], [place] [address]  Ethanol     Status: None   Collection Time: 03/22/19  4:33 AM  Result Value Ref Range   Alcohol, Ethyl (B) <10 <10 mg/dL    Comment: (NOTE) Lowest detectable limit for serum alcohol is 10 mg/dL. For medical purposes only. Performed at [facility], [address] [place], [place] [address]   CBC with Diff     Status: None   Collection Time: 03/22/19  4:33 AM  Result Value Ref Range   WBC 6.2 4.0 - 10.5 K/uL   RBC 4.65 4.22 - 5.81 MIL/uL   Hemoglobin 13.7 13.0 - 17.0 g/dL   HCT 29.5 28.4 - 13.2 %   MCV 86.9 80.0 - 100.0 fL   MCH 29.5 26.0 - 34.0 pg   MCHC 33.9 30.0 - 36.0 g/dL   RDW 44.0 10.2 - 72.5 %   Platelets 220 150 - 400 K/uL   nRBC 0.0 0.0 - 0.2 %   Neutrophils Relative % 53 %   Neutro Abs 3.3 1.7 - 7.7 K/uL   Lymphocytes Relative 40 %   Lymphs Abs 2.5 0.7 - 4.0 K/uL   Monocytes Relative 7 %   Monocytes Absolute 0.4 0.1 - 1.0 K/uL   Eosinophils Relative 0 %   Eosinophils Absolute 0.0 0.0 - 0.5 K/uL   Basophils Relative 0 %   Basophils  Absolute 0.0 0.0 - 0.1 K/uL   Immature Granulocytes 0 %   Abs Immature Granulocytes 0.01 0.00 - 0.07 K/uL    Comment: Performed at [facility] [address] [place], [place] [address]  Salicylate level     Status: Abnormal   Collection Time: 03/22/19  4:33 AM  Result Value Ref Range   Salicylate Lvl <5.8 (L) 7.0 - 30.0 mg/dL    Comment: Performed at [facility], [address] [place], [place] [address]  Acetaminophen level     Status: Abnormal   Collection Time: 03/22/19  4:33 AM  Result Value Ref Range   Acetaminophen (Tylenol), Serum <10 (L) 10 - 30 ug/mL    Comment: (NOTE) Therapeutic concentrations vary significantly. A range of 10-30 ug/mL  may be an effective concentration for many patients. However, some  are best treated at concentrations outside of this range. Acetaminophen concentrations >150 ug/mL at 4 hours after ingestion  and >50 ug/mL at 12 hours after ingestion are often associated with  toxic reactions. Performed at [facility], [address] [place], [place] [address]     No current facility-administered medications for this encounter.   Current Outpatient Medications  Medication Sig Dispense Refill  . benztropine (COGENTIN) 1 MG tablet Take 1 tablet (1 mg total) by mouth daily. 30 tablet 0  . divalproex (DEPAKOTE) 500 MG DR tablet Take 1 tablet (500 mg total) by mouth every 12 (twelve) hours. 60 tablet 0  . haloperidol (HALDOL) 5 MG tablet Take 1 tablet (5 mg total) by mouth 2 (two) times daily. 60 tablet 0    Musculoskeletal: Strength & Muscle Tone: within normal limits Gait & Station: normal Patient leans: N/A  Psychiatric Specialty Exam: Physical Exam  Review of Systems  Blood pressure 101/65, pulse (!) 55, temperature 97.7 F (36.5 C), temperature source Oral, resp. rate 16, height 6' (1.829 m), weight 72.6 kg, SpO2 100 %.Body mass index is 21.7 kg/m.  General Appearance: Disheveled  Eye Contact:  Good   Speech:  Clear and Coherent  Volume:  Normal  Mood:  Euthymic  Affect:  Appropriate  Thought Process:  Coherent  Orientation:  Full (Time, Place, and Person)  Thought Content:  Logical  Suicidal Thoughts:  No  Homicidal Thoughts:  No  Memory:  Recent;   Fair  Judgement:  Fair  Insight:  Fair  Psychomotor Activity:  Normal  Concentration:  Concentration: Fair  Recall:  [employer] of Knowledge:  Fair  Language:  Fair  Akathisia:  No  Handed:  Right  AIMS (if indicated):     Assets:  Communication Skills Desire for Improvement Leisure Time Physical Health Resilience  ADL's:  Intact  Cognition:  WNL  Sleep:        Treatment Plan Summary: 29 year old male with a past psychiatric history of schizophrenia.  Although patient carries this diagnosis writer has never seen patient psychotic without significant drug intoxication.  For these reasons patient typically will clear up after sleeping for several hours in the emergency department.  Today's presentation was no different as patient initially presented with psychiatric complaints but upon reevaluation this morning denied everything and was able to clearly and reasonably explain.  Patient does not meet criteria for inpatient hospitalization and is provided with outpatient resources.  Diagnosis: Polysubstance abuse  Disposition: No evidence of imminent risk to self or others at present.   Patient does not meet criteria for psychiatric inpatient admission. Supportive therapy provided about ongoing stressors. Discussed crisis plan, support from social network, calling 911, coming to the Emergency Department, and calling Suicide Hotline.  Dixie Dials, MD 03/22/2019 12:07 PM
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
## 2019-03-22 NOTE — ED Provider Notes (Signed)
Patient has now been seen and cleared for discharge by psychiatry.  IVC has been rescinded   Sharyn Creamer, MD 03/22/19 1124

## 2019-03-22 NOTE — ED Notes (Addendum)
Pt was given sandwich tray and ginger ale with ice, no lid or straw was given. Pt ate 100%.
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
## 2019-03-22 NOTE — ED Provider Notes (Signed)
-----------------------------------------   8:07 AM on 03/22/2019 -----------------------------------------   Blood pressure (!) 88/55, pulse (!) 52, temperature 97.6 F (36.4 C), temperature source Oral, resp. rate 16, height 6' (1.829 m), weight 72.6 kg, SpO2 100 %.  The patient is calm and cooperative at this time.  There have been no acute events since the last update.  Awaiting disposition plan from Behavioral Medicine and/or Social Work team(s).    Sharyn Creamer, MD 03/22/19 [phone]

## 2019-03-31 ENCOUNTER — Emergency Department
Admission: EM | Admit: 2019-03-31 | Discharge: 2019-04-01 | Disposition: A | Discharge status: Home / Self Care | Payer: Medicare Other | Attending: Emergency Medicine | Admitting: Emergency Medicine

## 2019-03-31 DIAGNOSIS — F1494 Cocaine use, unspecified with cocaine-induced mood disorder: Secondary | ICD-10-CM | POA: Diagnosis present

## 2019-03-31 DIAGNOSIS — Y939 Activity, unspecified: Secondary | ICD-10-CM | POA: Insufficient documentation

## 2019-03-31 DIAGNOSIS — Y999 Unspecified external cause status: Secondary | ICD-10-CM | POA: Diagnosis not present

## 2019-03-31 DIAGNOSIS — F209 Schizophrenia, unspecified: Secondary | ICD-10-CM | POA: Diagnosis not present

## 2019-03-31 DIAGNOSIS — F17200 Nicotine dependence, unspecified, uncomplicated: Secondary | ICD-10-CM | POA: Diagnosis not present

## 2019-03-31 DIAGNOSIS — S0990XA Unspecified injury of head, initial encounter: Secondary | ICD-10-CM | POA: Diagnosis not present

## 2019-03-31 DIAGNOSIS — Z20822 Contact with and (suspected) exposure to covid-19: Secondary | ICD-10-CM | POA: Diagnosis not present

## 2019-03-31 DIAGNOSIS — F1424 Cocaine dependence with cocaine-induced mood disorder: Secondary | ICD-10-CM | POA: Insufficient documentation

## 2019-03-31 DIAGNOSIS — Y92410 Unspecified street and highway as the place of occurrence of the external cause: Secondary | ICD-10-CM | POA: Diagnosis not present

## 2019-03-31 DIAGNOSIS — R45851 Suicidal ideations: Secondary | ICD-10-CM | POA: Insufficient documentation

## 2019-03-31 DIAGNOSIS — F1414 Cocaine abuse with cocaine-induced mood disorder: Secondary | ICD-10-CM | POA: Diagnosis present

## 2019-03-31 DIAGNOSIS — Z79899 Other long term (current) drug therapy: Secondary | ICD-10-CM | POA: Insufficient documentation
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
## 2019-04-01 ENCOUNTER — Emergency Department: Payer: Medicare Other

## 2019-04-01 ENCOUNTER — Other Ambulatory Visit: Payer: Self-pay

## 2019-04-01 DIAGNOSIS — S0990XA Unspecified injury of head, initial encounter: Secondary | ICD-10-CM | POA: Diagnosis not present

## 2019-04-01 LAB — SAMPLE TO BLOOD BANK

## 2019-04-01 LAB — RESPIRATORY PANEL BY RT PCR (FLU A&B, COVID)
Influenza A by PCR: NEGATIVE
Influenza B by PCR: NEGATIVE
SARS Coronavirus 2 by RT PCR: NEGATIVE

## 2019-04-01 LAB — CBC WITH DIFFERENTIAL/PLATELET
Abs Immature Granulocytes: 0.01 10*3/uL (ref 0.00–0.07)
Basophils Absolute: 0 10*3/uL (ref 0.0–0.1)
Basophils Relative: 0 %
Eosinophils Absolute: 0 10*3/uL (ref 0.0–0.5)
Eosinophils Relative: 1 %
HCT: 39.2 % (ref 39.0–52.0)
Hemoglobin: 13.4 g/dL (ref 13.0–17.0)
Immature Granulocytes: 0 %
Lymphocytes Relative: 44 %
Lymphs Abs: 2.3 10*3/uL (ref 0.7–4.0)
MCH: 29.5 pg (ref 26.0–34.0)
MCHC: 34.2 g/dL (ref 30.0–36.0)
MCV: 86.2 fL (ref 80.0–100.0)
Monocytes Absolute: 0.5 10*3/uL (ref 0.1–1.0)
Monocytes Relative: 10 %
Neutro Abs: 2.3 10*3/uL (ref 1.7–7.7)
Neutrophils Relative %: 45 %
Platelets: 210 10*3/uL (ref 150–400)
RBC: 4.55 MIL/uL (ref 4.22–5.81)
RDW: 12.5 % (ref 11.5–15.5)
WBC: 5.2 10*3/uL (ref 4.0–10.5)
nRBC: 0 % (ref 0.0–0.2)

## 2019-04-01 LAB — COMPREHENSIVE METABOLIC PANEL
ALT: 17 U/L (ref 0–44)
AST: 22 U/L (ref 15–41)
Albumin: 4 g/dL (ref 3.5–5.0)
Alkaline Phosphatase: 60 U/L (ref 38–126)
Anion gap: 9 (ref 5–15)
BUN: 12 mg/dL (ref 6–20)
CO2: 24 mmol/L (ref 22–32)
Calcium: 8.9 mg/dL (ref 8.9–10.3)
Chloride: 107 mmol/L (ref 98–111)
Creatinine, Ser: 0.87 mg/dL (ref 0.61–1.24)
GFR calc Af Amer: >60 mL/min (ref >60)
GFR calc non Af Amer: >60 mL/min (ref >60)
Glucose, Bld: 97 mg/dL (ref 70–99)
Potassium: 3.1 mmol/L — ABNORMAL LOW (ref 3.5–5.1)
Sodium: 140 mmol/L (ref 135–145)
Total Bilirubin: 0.6 mg/dL (ref 0.3–1.2)
Total Protein: 7.4 g/dL (ref 6.5–8.1)

## 2019-04-01 LAB — SALICYLATE LEVEL: Salicylate Lvl: <7 mg/dL — ABNORMAL LOW (ref 7.0–30.0)

## 2019-04-01 LAB — ACETAMINOPHEN LEVEL: Acetaminophen (Tylenol), Serum: <10 ug/mL — ABNORMAL LOW (ref 10–30)

## 2019-04-01 LAB — ETHANOL: Alcohol, Ethyl (B): <10 mg/dL (ref <10)

## 2019-04-01 MED ORDER — IOHEXOL 300 MG/ML  SOLN
100.0000 mL | Freq: Once | INTRAMUSCULAR | Status: AC | PRN
  Administered 2019-04-01: 100 mL via INTRAVENOUS

## 2019-04-01 MED ORDER — SODIUM CHLORIDE 0.9 % IV BOLUS
1000.0000 mL | Freq: Once | INTRAVENOUS | Status: AC
  Administered 2019-04-01: 1000 mL via INTRAVENOUS

## 2019-04-01 NOTE — ED Notes (Signed)
Pt to CT
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
## 2019-04-01 NOTE — ED Notes (Signed)
Pt. Introduced to unit.  Pt. Advised of cameras, safety checks and bathroom use.  Pt. Calm and cooperative at this time.  Pt. Requested and was given meal tray and drink.

## 2019-04-01 NOTE — ED Notes (Signed)
Pt was dressed out into approved hospital attire (burgandy scrubs and yellow socks) by this RN and Lurena Joiner , RN. Placed in pt belongings bag was a red long sleeve shirt, a gray shirt, and a white shirt, a pair of underwear, black shorts, black pants, black socks, black boots, a black coat and a red envelope that PD had retrieved from patient containing, stocking hat, phone charger, cell phone, ID, lighter, 4 pieces of paper, census ID and head phones. These were labeled with pt info and bagged in 2 separate bags.

## 2019-04-01 NOTE — BH Assessment (Signed)
Patient would not wake up to be assessed. Tried calling his name loudly and shook thebed. Believe patient may have been playing "Possum"

## 2019-04-01 NOTE — ED Notes (Signed)
Meal tray placed in room 

## 2019-04-01 NOTE — ED Notes (Signed)
Pt discharged home. Pt denies SI. VS stable. All belongings returned to patient.  Pt signed for discharge. Discharge instructions reviewed with pt.
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
## 2019-05-01 ENCOUNTER — Other Ambulatory Visit: Payer: Self-pay

## 2019-05-01 ENCOUNTER — Emergency Department
Admission: EM | Admit: 2019-05-01 | Discharge: 2019-05-02 | Disposition: A | Discharge status: Home / Self Care | Payer: Medicare Other | Attending: Emergency Medicine | Admitting: Emergency Medicine

## 2019-05-01 ENCOUNTER — Encounter: Payer: Self-pay | Admitting: Emergency Medicine

## 2019-05-01 DIAGNOSIS — F19950 Other psychoactive substance use, unspecified with psychoactive substance-induced psychotic disorder with delusions: Secondary | ICD-10-CM | POA: Diagnosis present

## 2019-05-01 DIAGNOSIS — F17210 Nicotine dependence, cigarettes, uncomplicated: Secondary | ICD-10-CM | POA: Insufficient documentation

## 2019-05-01 DIAGNOSIS — R45851 Suicidal ideations: Secondary | ICD-10-CM | POA: Diagnosis present

## 2019-05-01 DIAGNOSIS — Z20822 Contact with and (suspected) exposure to covid-19: Secondary | ICD-10-CM | POA: Insufficient documentation

## 2019-05-01 DIAGNOSIS — Z79899 Other long term (current) drug therapy: Secondary | ICD-10-CM | POA: Diagnosis not present

## 2019-05-01 DIAGNOSIS — F209 Schizophrenia, unspecified: Secondary | ICD-10-CM

## 2019-05-01 DIAGNOSIS — F200 Paranoid schizophrenia: Secondary | ICD-10-CM | POA: Diagnosis present

## 2019-05-01 DIAGNOSIS — R440 Auditory hallucinations: Secondary | ICD-10-CM

## 2019-05-01 LAB — ETHANOL: Alcohol, Ethyl (B): <10 mg/dL (ref <10)

## 2019-05-01 LAB — COMPREHENSIVE METABOLIC PANEL
ALT: 43 U/L (ref 0–44)
AST: 22 U/L (ref 15–41)
Albumin: 4.6 g/dL (ref 3.5–5.0)
Alkaline Phosphatase: 74 U/L (ref 38–126)
Anion gap: 8 (ref 5–15)
BUN: 22 mg/dL — ABNORMAL HIGH (ref 6–20)
CO2: 28 mmol/L (ref 22–32)
Calcium: 9.4 mg/dL (ref 8.9–10.3)
Chloride: 104 mmol/L (ref 98–111)
Creatinine, Ser: 0.82 mg/dL (ref 0.61–1.24)
GFR calc Af Amer: >60 mL/min (ref >60)
GFR calc non Af Amer: >60 mL/min (ref >60)
Glucose, Bld: 121 mg/dL — ABNORMAL HIGH (ref 70–99)
Potassium: 3.8 mmol/L (ref 3.5–5.1)
Sodium: 140 mmol/L (ref 135–145)
Total Bilirubin: 1 mg/dL (ref 0.3–1.2)
Total Protein: 8.5 g/dL — ABNORMAL HIGH (ref 6.5–8.1)

## 2019-05-01 LAB — CBC
HCT: 42.7 % (ref 39.0–52.0)
Hemoglobin: 14.3 g/dL (ref 13.0–17.0)
MCH: 29.3 pg (ref 26.0–34.0)
MCHC: 33.5 g/dL (ref 30.0–36.0)
MCV: 87.5 fL (ref 80.0–100.0)
Platelets: 217 10*3/uL (ref 150–400)
RBC: 4.88 MIL/uL (ref 4.22–5.81)
RDW: 12.9 % (ref 11.5–15.5)
WBC: 5.8 10*3/uL (ref 4.0–10.5)
nRBC: 0 % (ref 0.0–0.2)

## 2019-05-01 LAB — SALICYLATE LEVEL: Salicylate Lvl: <7 mg/dL — ABNORMAL LOW (ref 7.0–30.0)

## 2019-05-01 LAB — ACETAMINOPHEN LEVEL: Acetaminophen (Tylenol), Serum: <10 ug/mL — ABNORMAL LOW (ref 10–30)

## 2019-05-01 NOTE — ED Provider Notes (Signed)
[facility] Emergency Department Provider Note  ____________________________________________  Time seen: Approximately 6:10 AM  I have reviewed the triage vital signs and the nursing notes.   HISTORY  Chief Complaint Psychiatric Evaluation   HPI [name] is a 29 y.o. male history of schizophrenia and cocaine abuse who presents voluntarily for suicidal ideation.  Patient reports hearing voices that tell him to hurt himself and kill himself.  He reports that he does not want to do that.  He only feels safe here in the hospital.  He wants to be admitted.  He denies homicidal thoughts.  Has been using crack cocaine and marijuana.  Denies alcohol use.  Endorses compliance with his medications.  Patient does not have a plan. Denies any medical complaints.    Past Medical History:  Diagnosis Date  . Schizophrenia [facility])     Patient Active Problem List   Diagnosis Date Noted  . Schizophrenia, paranoid type (HCC) 03/12/2019  . Cocaine abuse with intoxication (HCC)   . Self-inflicted laceration of right wrist (HCC) 11/30/2018  . Suicide ideation 11/29/2018  . Cocaine abuse with cocaine-induced mood disorder (HCC) 11/06/2018  . Agitation   . Cannabis abuse 11/10/2017    History reviewed. No pertinent surgical history.  Prior to Admission medications   Medication Sig Start Date End Date Taking? Authorizing Provider  benztropine (COGENTIN) 1 MG tablet Take 1 tablet (1 mg total) by mouth daily. 03/14/19   Cristofano, Worthy Rancher, MD  divalproex (DEPAKOTE) 500 MG DR tablet Take 1 tablet (500 mg total) by mouth every 12 (twelve) hours. 03/13/19   Cristofano, Worthy Rancher, MD  haloperidol (HALDOL) 5 MG tablet Take 1 tablet (5 mg total) by mouth 2 (two) times daily. 03/13/19   Cristofano, Worthy Rancher, MD    Allergies Ibuprofen, Risperidone and related, Ziprasidone, and Quetiapine  No family history on file.  Social History Social History   Tobacco Use  . Smoking status:  Current Every Day Smoker  . Smokeless tobacco: Never Used  Substance Use Topics  . Alcohol use: Yes  . Drug use: Yes    Types: Cocaine, Marijuana    Review of Systems  Constitutional: Negative for fever. Eyes: Negative for visual changes. ENT: Negative for sore throat. Neck: No neck pain  Cardiovascular: Negative for chest pain. Respiratory: Negative for shortness of breath. Gastrointestinal: Negative for abdominal pain, vomiting or diarrhea. Genitourinary: Negative for dysuria. Musculoskeletal: Negative for back pain. Skin: Negative for rash. Neurological: Negative for headaches, weakness or numbness. Psych: No SI or HI. + AH  ____________________________________________   PHYSICAL EXAM:  VITAL SIGNS: ED Triage Vitals  Enc Vitals Group     BP 05/01/19 0536 (!) 123/91     Pulse Rate 05/01/19 0536 69     Resp 05/01/19 0536 18     Temp 05/01/19 0536 97.7 F (36.5 C)     Temp Source 05/01/19 0536 Oral     SpO2 05/01/19 0536 100 %     Weight 05/01/19 0537 145 lb (65.8 kg)     Height 05/01/19 0537 5\' 10"  (1.778 m)     Head Circumference --      Peak Flow --      Pain Score 05/01/19 0537 0     Pain Loc --      Pain Edu? --      Excl. in GC? --     Constitutional: Alert and oriented. Well appearing and in no apparent distress. HEENT:  Head: Normocephalic and atraumatic.         Eyes: Conjunctivae are normal. Sclera is non-icteric.       Mouth/Throat: Mucous membranes are moist.       Neck: Supple with no signs of meningismus. Cardiovascular: Regular rate and rhythm.  Respiratory: Normal respiratory effort.  Gastrointestinal: Soft, non tender, and non distended. Musculoskeletal: No edema, cyanosis, or erythema of extremities. Neurologic: Normal speech and language. Face is symmetric. Moving all extremities. No gross focal neurologic deficits are appreciated. Skin: Skin is warm, dry and intact. No rash noted. Psychiatric: Mood and affect are normal. Auditory  hallucinations  ____________________________________________   LABS (all labs ordered are listed, but only abnormal results are displayed)  Labs Reviewed  COMPREHENSIVE METABOLIC PANEL - Abnormal; Notable for the following components:      Result Value   Glucose, Bld 121 (*)    BUN 22 (*)    Total Protein 8.5 (*)    All other components within normal limits  SALICYLATE LEVEL - Abnormal; Notable for the following components:   Salicylate Lvl <7.6 (*)    All other components within normal limits  ACETAMINOPHEN LEVEL - Abnormal; Notable for the following components:   Acetaminophen (Tylenol), Serum <10 (*)    All other components within normal limits  ETHANOL  CBC  URINE DRUG SCREEN, QUALITATIVE ([facility] ONLY)   ____________________________________________  EKG  none  ____________________________________________  RADIOLOGY  none  ____________________________________________   PROCEDURES  Procedure(s) performed: None Procedures Critical Care performed:  None ____________________________________________   INITIAL IMPRESSION / ASSESSMENT AND PLAN / ED COURSE  29 y.o. male history of schizophrenia and cocaine abuse who presents voluntarily for command auditory hallucinations telling him to kill himself.  Patient reports that he does not want to hurt himself and does not have a plan.  He wants help for the voices to stop.  He uses crack cocaine.  Endorses compliance with his medications.  At this time I do not think patient meets criteria for IVC.  We will keep him involuntarily and consult psychiatry.  We will do labs for medical clearance.  Old medical records have been reviewed.    _________________________ 6:40 AM on 05/01/2019 -----------------------------------------  Labs for medical clearance evaluated with no significant findings.  Patient is medically cleared for psych evaluation.   Please note:  Patient was evaluated in Emergency Department today for the  symptoms described in the history of present illness. Patient was evaluated in the context of the global COVID-19 pandemic, which necessitated consideration that the patient might be at risk for infection with the SARS-CoV-2 virus that causes COVID-19. Institutional protocols and algorithms that pertain to the evaluation of patients at risk for COVID-19 are in a state of rapid change based on information released by regulatory bodies including the CDC and federal and state organizations. These policies and algorithms were followed during the patient's care in the ED.  Some ED evaluations and interventions may be delayed as a result of limited staffing during the pandemic.   ____________________________________________   FINAL CLINICAL IMPRESSION(S) / ED DIAGNOSES   Final diagnoses:  Schizophrenia, unspecified type ([place])  Auditory hallucinations      NEW MEDICATIONS STARTED DURING THIS VISIT:  ED Discharge Orders    None       Note:  This document was prepared using Dragon voice recognition software and may include unintentional dictation errors.    Alfred Levins, [place], MD 05/01/19 [phone]

## 2019-05-01 NOTE — ED Notes (Signed)
VOL/  SEEN  BY  DR  Burgess Estelle  PENDING  PLACEMENT

## 2019-05-01 NOTE — Consult Note (Signed)
[facility] Face-to-Face Psychiatry Consult   Reason for Consult: SI Referring Physician: ED MD Patient Identification: [name] MRN:  [id number] Principal Diagnosis: Schizophrenia, paranoid type (HCC) Diagnosis:  Principal Problem:   Schizophrenia, paranoid type (HCC)   Total Time spent with patient: 15 minutes  Subjective:   [name] is a 29 y.o. male patient admitted with SI  He is quite sleepy in the ED but is able to provide some history. He states that he continues to hear vioces and has suicidality without a specific plan  HPI: per ED MD [name] is a 29 y.o. male history of schizophrenia and cocaine abuse who presents voluntarily for suicidal ideation.  Patient reports hearing voices that tell him to hurt himself and kill himself.  He reports that he does not want to do that.  He only feels safe here in the hospital.  He wants to be admitted.  He denies homicidal thoughts.  Has been using crack cocaine and marijuana.  Denies alcohol use.  Endorses compliance with his medications.  Patient does not have a plan.Denies any medical complaints.  Past Psychiatric History:  Risk to Self:   Risk to Others:   Prior Inpatient Therapy:   Prior Outpatient Therapy:    Past Medical History:  Past Medical History:  Diagnosis Date  . Schizophrenia (HCC)    History reviewed. No pertinent surgical history. Family History: No family history on file. Family Psychiatric  History: Social History:  Social History   Substance and Sexual Activity  Alcohol Use Yes     Social History   Substance and Sexual Activity  Drug Use Yes  . Types: Cocaine, Marijuana    Social History   Socioeconomic History  . Marital status: Single    Spouse name: Not on file  . Number of children: Not on file  . Years of education: Not on file  . Highest education level: Not on file  Occupational History  . Not on file  Tobacco Use  . Smoking status: Current Every Day Smoker  . Smokeless  tobacco: Never Used  Substance and Sexual Activity  . Alcohol use: Yes  . Drug use: Yes    Types: Cocaine, Marijuana  . Sexual activity: Not on file  Other Topics Concern  . Not on file  Social History Narrative  . Not on file   Social Determinants of Health   Financial Resource Strain:   . Difficulty of Paying Living Expenses:   Food Insecurity:   . Worried About Programme researcher, broadcasting/film/video in the Last Year:   . Barista in the Last Year:   Transportation Needs:   . Freight forwarder (Medical):   Marland Kitchen Lack of Transportation (Non-Medical):   Physical Activity:   . Days of Exercise per Week:   . Minutes of Exercise per Session:   Stress:   . Feeling of Stress :   Social Connections:   . Frequency of Communication with Friends and Family:   . Frequency of Social Gatherings with Friends and Family:   . Attends Religious Services:   . Active Member of Clubs or Organizations:   . Attends Banker Meetings:   Marland Kitchen Marital Status:    Additional Social History:    Allergies:   Allergies  Allergen Reactions  . Ibuprofen Swelling    Tongue swelling  . Risperidone And Related Other (See Comments) and Swelling    gynecomastia gynecomastia   . Ziprasidone Swelling  Tongue swells  . Quetiapine Other (See Comments)    Depression, suicidality, adverse effect: seizures     Labs:  Results for orders placed or performed during the hospital encounter of 05/01/19 (from the past 48 hour(s))  Comprehensive metabolic panel     Status: Abnormal   Collection Time: 05/01/19  5:38 AM  Result Value Ref Range   Sodium 140 135 - 145 mmol/L   Potassium 3.8 3.5 - 5.1 mmol/L   Chloride 104 98 - 111 mmol/L   CO2 28 22 - 32 mmol/L   Glucose, Bld 121 (H) 70 - 99 mg/dL    Comment: Glucose reference range applies only to samples taken after fasting for at least 8 hours.   BUN 22 (H) 6 - 20 mg/dL   Creatinine, Ser 0.82 0.61 - 1.24 mg/dL   Calcium 9.4 8.9 - 10.3 mg/dL   Total  Protein 8.5 (H) 6.5 - 8.1 g/dL   Albumin 4.6 3.5 - 5.0 g/dL   AST 22 15 - 41 U/L   ALT 43 0 - 44 U/L   Alkaline Phosphatase 74 38 - 126 U/L   Total Bilirubin 1.0 0.3 - 1.2 mg/dL   GFR calc non Af Amer >60 >60 mL/min   GFR calc Af Amer >60 >60 mL/min   Anion gap 8 5 - 15    Comment: Performed at [facility], [address], [place], [place] [address]  Ethanol     Status: None   Collection Time: 05/01/19  5:38 AM  Result Value Ref Range   Alcohol, Ethyl (B) <10 <10 mg/dL    Comment: (NOTE) Lowest detectable limit for serum alcohol is 10 mg/dL. For medical purposes only. Performed at [facility], [address] [place], [place] [address]   Salicylate level     Status: Abnormal   Collection Time: 05/01/19  5:38 AM  Result Value Ref Range   Salicylate Lvl <2.8 (L) 7.0 - 30.0 mg/dL    Comment: Performed at [facility], [address] [place], [place] [address]  Acetaminophen level     Status: Abnormal   Collection Time: 05/01/19  5:38 AM  Result Value Ref Range   Acetaminophen (Tylenol), Serum <10 (L) 10 - 30 ug/mL    Comment: (NOTE) Therapeutic concentrations vary significantly. A range of 10-30 ug/mL  may be an effective concentration for many patients. However, some  are best treated at concentrations outside of this range. Acetaminophen concentrations >150 ug/mL at 4 hours after ingestion  and >50 ug/mL at 12 hours after ingestion are often associated with  toxic reactions. Performed at [facility], [address] [place], [place] [address]   cbc     Status: None   Collection Time: 05/01/19  5:38 AM  Result Value Ref Range   WBC 5.8 4.0 - 10.5 K/uL   RBC 4.88 4.22 - 5.81 MIL/uL   Hemoglobin 14.3 13.0 - 17.0 g/dL   HCT 42.7 39.0 - 52.0 %   MCV 87.5 80.0 - 100.0 fL   MCH 29.3 26.0 - 34.0 pg   MCHC 33.5 30.0 - 36.0 g/dL   RDW 12.9 11.5 - 15.5 %   Platelets 217 150 - 400 K/uL   nRBC 0.0 0.0 - 0.2 %    Comment: Performed at  [facility], [address] [place], [place] [address]    No current facility-administered medications for this encounter.   Current Outpatient Medications  Medication Sig Dispense Refill  . benztropine (  COGENTIN) 1 MG tablet Take 1 tablet (1 mg total) by mouth daily. 30 tablet 0  . divalproex (DEPAKOTE) 500 MG DR tablet Take 1 tablet (500 mg total) by mouth every 12 (twelve) hours. 60 tablet 0  . haloperidol (HALDOL) 5 MG tablet Take 1 tablet (5 mg total) by mouth 2 (two) times daily. 60 tablet 0    Psychiatric Specialty Exam: Physical Exam  Review of Systems  Blood pressure (!) 123/91, pulse 69, temperature 97.7 F (36.5 C), temperature source Oral, resp. rate 18, height 5\' 10"  (1.778 m), weight 65.8 kg, SpO2 100 %.Body mass index is 20.81 kg/m.  General Appearance: Disheveled  Eye Contact:  Poor  Speech:  Garbled  Volume:  Decreased  Mood:  Depressed  Affect:  Flat  Thought Process:  Linear  Orientation:  Full (Time, Place, and Person)  Thought Content:  Hallucinations: Auditory  Suicidal Thoughts:  Yes.  without intent/plan  Homicidal Thoughts:  No  Memory:  Immediate;   Fair  Judgement:  Poor  Insight:  Lacking  Psychomotor Activity:  Decreased  Concentration:  Concentration: Poor  Recall:  of Knowledge:  Fair  Language:  Fair  Akathisia:  No  Handed:  Ambidextrous  AIMS (if indicated):     Assets:  Others:  No obvious identified  ADL's:  Impaired  Cognition:  Impaired,  Moderate  Sleep:        Treatment Plan Summary: Daily contact with patient to assess and evaluate symptoms and progress in treatment and Medication management  Disposition: Continue to observe symptomatology. Inpatient treatment is likely if alertness does not impove and hallucinations do not resolve.  [employer], MD 05/01/2019 3:14 PM

## 2019-05-01 NOTE — ED Notes (Signed)
Pt woken up by this nurse to put a side rail up so they patient didn't fall off the bed while he is sleeping. This RN introduced herself to the patient, patient calm and cooperative at this time. This RN asked if the patient has any needs, patient requests a drink and some food. This RN warmed up patients food and gave him a drink. Pt pleasant at this time, denies further needs. Pt alerted to let this RN know if he has further needs.

## 2019-05-01 NOTE — ED Notes (Signed)
Patient changed into hospital scrubs by this RN and Nicki Gu[place]ara EDT. Patient belongings placed in belonging bag and given to primary RN. Patient has black sweat shirt, black t-shirt, black belt, black pants, shoes, socks, black jeans and underwear.

## 2019-05-01 NOTE — ED Notes (Signed)
Breakfast tray placed at bedside, pt resting with eyes closed at this time. Resps even and unlabored

## 2019-05-01 NOTE — ED Notes (Signed)
Pt. Alert and oriented, warm and dry, in no distress. Pt. Denies HI, and VH. Patient states having SI thoughts but does not have a plan. Patient contracts for safety with this Clinical research associate. Patient admits to hearing voices telling him to hurt himself. Pt. Encouraged to let nursing staff know of any concerns or needs.

## 2019-05-01 NOTE — ED Notes (Signed)
Report to include Situation, Background, Assessment, and Recommendations received from RN Chrissy. Patient alert and oriented, warm and dry, in no acute distress. Patient denies HI, but reports SI,AVH. Patient made aware of Q15 minute rounds and Psychologist, counselling presence for their safety. Patient instructed to come to me with needs or concerns.

## 2019-05-01 NOTE — ED Triage Notes (Signed)
Patient states that he is here for SI and that the voices are telling him to do things that he does not want to do.

## 2019-05-01 NOTE — ED Notes (Signed)
Pt resting with eyes closed, equal rise and fall of chest observed by this RN. Monitoring cords and hazardous materials removed from room by this RN.

## 2019-05-01 NOTE — ED Notes (Signed)
Pt given dinner tray at this time.  

## 2019-05-02 LAB — RESPIRATORY PANEL BY RT PCR (FLU A&B, COVID)
Influenza A by PCR: NEGATIVE
Influenza B by PCR: NEGATIVE
SARS Coronavirus 2 by RT PCR: NEGATIVE

## 2019-05-02 NOTE — ED Notes (Addendum)
He is voluntary -  He denies SI/HI  He denies AH/VH   He is verbalizing a desire to leave EDP notified  Pt will sign out AMA

## 2019-05-02 NOTE — ED Notes (Signed)
Pt lying in bed asleep (eyes closed with equal unlabored RR). Nothing needed from staff at this time. Will continue to monitor

## 2019-05-02 NOTE — ED Notes (Signed)
Pt was given a meal tray and a beverage. Nothing more needed from staff at this time

## 2019-05-02 NOTE — ED Notes (Addendum)
Pt lying in bed asleep (eyes closed with equal unlabored RR). Nothing needed from staff at this time. Will continue to monitor  

## 2019-05-02 NOTE — ED Provider Notes (Signed)
Emergency Medicine Observation Re-evaluation Note  [name] is a 29 y.o. male, seen on rounds today.  Pt initially presented to the ED for complaints of Psychiatric Evaluation Currently, the patient is resting.  Physical Exam  BP 101/60   Pulse 66   Temp 97.7 F (36.5 C) (Oral)   Resp 16   Ht 1.778 m (5\' 10" )   Wt 65.8 kg   SpO2 99%   BMI 20.81 kg/m  Physical Exam  ED Course / MDM  EKG:    I have reviewed the labs performed to date as well as medications administered while in observation.  Recent changes in the last 24 hours include evaluation by Dr. with psychiatry. Plan  Current plan is for inpatient psychiatric placement. Patient is not under full IVC at this time.   Burgess Estelle, MD 05/02/19 [phone]

## 2019-05-02 NOTE — Consult Note (Signed)
Per my note yesterday we would follow symptoms. Per nursing his alertness improved and there were no signs of hallucinations. He signed out AMA before I had a chance to see him.

## 2019-05-22 ENCOUNTER — Emergency Department
Admission: EM | Admit: 2019-05-22 | Discharge: 2019-05-24 | Disposition: A | Discharge status: Home / Self Care | Payer: Medicare Other | Attending: Emergency Medicine | Admitting: Emergency Medicine

## 2019-05-22 ENCOUNTER — Encounter: Payer: Self-pay | Admitting: Emergency Medicine

## 2019-05-22 ENCOUNTER — Other Ambulatory Visit: Payer: Self-pay

## 2019-05-22 DIAGNOSIS — F14129 Cocaine abuse with intoxication, unspecified: Secondary | ICD-10-CM | POA: Diagnosis present

## 2019-05-22 DIAGNOSIS — F259 Schizoaffective disorder, unspecified: Secondary | ICD-10-CM | POA: Diagnosis not present

## 2019-05-22 DIAGNOSIS — F1494 Cocaine use, unspecified with cocaine-induced mood disorder: Secondary | ICD-10-CM | POA: Diagnosis present

## 2019-05-22 DIAGNOSIS — F1414 Cocaine abuse with cocaine-induced mood disorder: Secondary | ICD-10-CM | POA: Diagnosis present

## 2019-05-22 DIAGNOSIS — Z046 Encounter for general psychiatric examination, requested by authority: Secondary | ICD-10-CM | POA: Diagnosis present

## 2019-05-22 DIAGNOSIS — F200 Paranoid schizophrenia: Secondary | ICD-10-CM | POA: Diagnosis present

## 2019-05-22 DIAGNOSIS — Z79899 Other long term (current) drug therapy: Secondary | ICD-10-CM | POA: Diagnosis not present

## 2019-05-22 DIAGNOSIS — F17210 Nicotine dependence, cigarettes, uncomplicated: Secondary | ICD-10-CM | POA: Insufficient documentation

## 2019-05-22 DIAGNOSIS — R45851 Suicidal ideations: Secondary | ICD-10-CM

## 2019-05-22 DIAGNOSIS — F1210 Cannabis abuse, uncomplicated: Secondary | ICD-10-CM | POA: Diagnosis present

## 2019-05-22 DIAGNOSIS — F1290 Cannabis use, unspecified, uncomplicated: Secondary | ICD-10-CM | POA: Diagnosis present

## 2019-05-22 DIAGNOSIS — F19950 Other psychoactive substance use, unspecified with psychoactive substance-induced psychotic disorder with delusions: Secondary | ICD-10-CM | POA: Diagnosis present

## 2019-05-22 DIAGNOSIS — Z20822 Contact with and (suspected) exposure to covid-19: Secondary | ICD-10-CM | POA: Insufficient documentation

## 2019-05-22 DIAGNOSIS — R451 Restlessness and agitation: Secondary | ICD-10-CM | POA: Diagnosis present

## 2019-05-22 DIAGNOSIS — F1520 Other stimulant dependence, uncomplicated: Secondary | ICD-10-CM | POA: Diagnosis present

## 2019-05-22 DIAGNOSIS — S61511A Laceration without foreign body of right wrist, initial encounter: Secondary | ICD-10-CM | POA: Diagnosis present

## 2019-05-22 HISTORY — DX: Schizoaffective disorder, unspecified: F25.9

## 2019-05-22 LAB — CBC
HCT: 42.2 % (ref 39.0–52.0)
Hemoglobin: 14.7 g/dL (ref 13.0–17.0)
MCH: 29.7 pg (ref 26.0–34.0)
MCHC: 34.8 g/dL (ref 30.0–36.0)
MCV: 85.3 fL (ref 80.0–100.0)
Platelets: 244 10*3/uL (ref 150–400)
RBC: 4.95 MIL/uL (ref 4.22–5.81)
RDW: 12.7 % (ref 11.5–15.5)
WBC: 6.4 10*3/uL (ref 4.0–10.5)
nRBC: 0 % (ref 0.0–0.2)

## 2019-05-22 LAB — COMPREHENSIVE METABOLIC PANEL
ALT: 13 U/L (ref 0–44)
AST: 17 U/L (ref 15–41)
Albumin: 4.8 g/dL (ref 3.5–5.0)
Alkaline Phosphatase: 63 U/L (ref 38–126)
Anion gap: 9 (ref 5–15)
BUN: 15 mg/dL (ref 6–20)
CO2: 25 mmol/L (ref 22–32)
Calcium: 9.4 mg/dL (ref 8.9–10.3)
Chloride: 106 mmol/L (ref 98–111)
Creatinine, Ser: 1.19 mg/dL (ref 0.61–1.24)
GFR calc Af Amer: >60 mL/min (ref >60)
GFR calc non Af Amer: >60 mL/min (ref >60)
Glucose, Bld: 106 mg/dL — ABNORMAL HIGH (ref 70–99)
Potassium: 3.7 mmol/L (ref 3.5–5.1)
Sodium: 140 mmol/L (ref 135–145)
Total Bilirubin: 0.8 mg/dL (ref 0.3–1.2)
Total Protein: 8.3 g/dL — ABNORMAL HIGH (ref 6.5–8.1)

## 2019-05-22 LAB — ETHANOL: Alcohol, Ethyl (B): <10 mg/dL (ref <10)

## 2019-05-22 LAB — SALICYLATE LEVEL: Salicylate Lvl: <7 mg/dL — ABNORMAL LOW (ref 7.0–30.0)

## 2019-05-22 LAB — ACETAMINOPHEN LEVEL: Acetaminophen (Tylenol), Serum: <10 ug/mL — ABNORMAL LOW (ref 10–30)

## 2019-05-22 NOTE — ED Notes (Signed)
Pt changed out into hospital appropriate scrubs by this RN with male officer in room.  Belongings placed into 2 bags.  Contents include: 1 black t shirt, 1 black pair of shoes, 1 black pair of socks, 1 black pair long pants, 1 black pair boxers, 1 black pair shorts, 1 $20 bill counted by nurse and patient and left in long pants pocket.

## 2019-05-22 NOTE — ED Triage Notes (Signed)
Pt to ED under IVC with [employer] c/o SI and caught running in traffic. Denies HI.  States auditory hallucinations.  Pt states voices telling him to run out in traffic.  Pt cooperative in triage.

## 2019-05-23 DIAGNOSIS — F259 Schizoaffective disorder, unspecified: Secondary | ICD-10-CM | POA: Diagnosis not present

## 2019-05-23 LAB — URINE DRUG SCREEN, QUALITATIVE (ARMC ONLY)
Amphetamines, Ur Screen: NOT DETECTED
Barbiturates, Ur Screen: NOT DETECTED
Benzodiazepine, Ur Scrn: NOT DETECTED
Cannabinoid 50 Ng, Ur ~~LOC~~: POSITIVE — AB
Cocaine Metabolite,Ur ~~LOC~~: POSITIVE — AB
MDMA (Ecstasy)Ur Screen: NOT DETECTED
Methadone Scn, Ur: NOT DETECTED
Opiate, Ur Screen: NOT DETECTED
Phencyclidine (PCP) Ur S: NOT DETECTED
Tricyclic, Ur Screen: NOT DETECTED

## 2019-05-23 LAB — SARS CORONAVIRUS 2 BY RT PCR (HOSPITAL ORDER, PERFORMED IN ~~LOC~~ HOSPITAL LAB): SARS Coronavirus 2: NEGATIVE

## 2019-05-23 MED ORDER — DROPERIDOL 2.5 MG/ML IJ SOLN
5.0000 mg | Freq: Once | INTRAMUSCULAR | Status: AC
  Administered 2019-05-23: 5 mg via INTRAMUSCULAR

## 2019-05-23 NOTE — ED Notes (Signed)

## 2019-05-23 NOTE — BH Assessment (Signed)
Assessment Note  [name] is an 29 y.o. African-American male who presents to the ED via [employer]. Per IVC report "patient was caught running in traffic. Denies HI and states auditory hallucinations. Patient states voices telling him to run out in traffic. Patient cooperative in triage".   Writer assessed patient in which he endorsed SI. Patient reported "It just pops up in my head". Patient endorsed having a court date soon as well reporting "I know its coming up". Patient reported that his court date has something to do with him running from the cops. During the assessment, patient presented with low affect and some drowsiness. Patient was able to talk and hold a conversation with Clinical research associate. At the time of the assessment, patient denied HI and visual/auditory hallucinations. Writer also corroborated with patients mother and sister. They both reported that patient has been living out on the streets and running from the cops every other day. Patients mother and sister reported that they are tired of taking care of him and that the situation is starting to stress them out. Patients sister reported that his mother was kicked out of her previous home due to patients erratic behavior and that they want outpatient resources to be able to assist patient once he is discharged from the hospital.    Patient is appropriate for an inpatient hospital stay and stabilization.     Diagnosis: Schizoaffective  Past Medical History:  Past Medical History:  Diagnosis Date  . Schizo affective schizophrenia (HCC)   . Schizophrenia (HCC)     History reviewed. No pertinent surgical history.  Family History: History reviewed. No pertinent family history.  Social History:  reports that he has been smoking. He has never used smokeless tobacco. He reports previous alcohol use. He reports current drug use. Drugs: Cocaine and Marijuana.  Additional Social History:  Alcohol / Drug Use Pain Medications: See  PTA Prescriptions: See PTA Over the Counter: See PTA History of alcohol / drug use?: Yes Longest period of sobriety (when/how long): Unsure Substance #1 Name of Substance 1: Cocaine 1 - Age of First Use: Unknown 1 - Amount (size/oz): Unknown 1 - Frequency: Unknown 1 - Duration: Unknown 1 - Last Use / Amount: Unknown Substance #2 Name of Substance 2: Cannibas 2 - Age of First Use: Unknown 2 - Amount (size/oz): Unknown 2 - Frequency: Unknown 2 - Duration: Unknown  CIWA: CIWA-Ar BP: (!) 102/58 Pulse Rate: 94 COWS:    Allergies:  Allergies  Allergen Reactions  . Ibuprofen Swelling    Tongue swelling  . Risperidone And Related Other (See Comments) and Swelling    gynecomastia gynecomastia   . Ziprasidone Swelling    Tongue swells  . Quetiapine Other (See Comments)    Depression, suicidality, adverse effect: seizures     Home Medications: (Not in a hospital admission)   OB/GYN Status:  No LMP for male patient.  General Assessment Data Location of Assessment: [facility] TTS Assessment: In system Is this a Tele or Face-to-Face Assessment?: Face-to-Face Is this an Initial Assessment or a Re-assessment for this encounter?: Initial Assessment Patient Accompanied by:: N/A Language Other than English: No What gender do you identify as?: Male Marital status: Single Pregnancy Status: No Can pt return to current living arrangement?: No Admission Status: Involuntary Petitioner: Police Is patient capable of signing voluntary admission?: No Referral Source: Self/Family/Friend Insurance type: (Medicare)  Medical Screening Exam [facility] Walk-in ONLY) Medical Exam completed: Yes  Crisis Care Plan Legal Guardian: (Self) Name of Psychiatrist:  None reported at this time Name of Therapist: None reported at this time  Education Status Is patient currently in school?: No Is the patient employed, unemployed or receiving disability?: Unemployed, Receiving disability income  Risk  to self with the past 6 months Suicidal Ideation: No Has patient been a risk to self within the past 6 months prior to admission? : Yes Suicidal Intent: No Has patient had any suicidal intent within the past 6 months prior to admission? : No Is patient at risk for suicide?: No Suicidal Plan?: No Has patient had any suicidal plan within the past 6 months prior to admission? : No Access to Means: No Previous Attempts/Gestures: No How many times?: (Unknown) Other Self Harm Risks: (Unknown) Triggers for Past Attempts: None known Intentional Self Injurious Behavior: (Walks into traffic) Family Suicide History: No Recent stressful life event(s): Financial Problems Persecutory voices/beliefs?: No Depression: Yes Depression Symptoms: Feeling angry/irritable Substance abuse history and/or treatment for substance abuse?: Yes Suicide prevention information given to non-admitted patients: Not applicable  Risk to Others within the past 6 months Homicidal Ideation: No Does patient have any lifetime risk of violence toward others beyond the six months prior to admission? : No Thoughts of Harm to Others: No Current Homicidal Intent: No Current Homicidal Plan: No Access to Homicidal Means: No Describe Access to Homicidal Means: (n/a) History of harm to others?: No Assessment of Violence: None Noted Does patient have access to weapons?: No Criminal Charges Pending?: (Has a court date coming up ) Does patient have a court date: Yes Court Date: (Unknown) Is patient on probation?: Unknown  Psychosis Hallucinations: Auditory, With command Delusions: None noted  Mental Status Report Appearance/Hygiene: Disheveled, In scrubs, Poor hygiene Eye Contact: Fair Motor Activity: Unable to assess Speech: Logical/coherent Level of Consciousness: Drowsy, Quiet/awake Mood: Depressed, Empty, Irritable, Despair Affect: Depressed Anxiety Level: Minimal Thought Processes: Coherent Judgement:  Impaired Orientation: Person, Place, Time, Situation Obsessive Compulsive Thoughts/Behaviors: None  Cognitive Functioning Concentration: Normal Memory: Recent Intact, Remote Intact Is patient IDD: No Insight: Poor Impulse Control: Unable to Assess Appetite: Fair Have you had any weight changes? : No Change Sleep: Unable to Assess Total Hours of Sleep: (unknown) Vegetative Symptoms: Not bathing  ADLScreening [facility] Assessment Services) Patient's cognitive ability adequate to safely complete daily activities?: Yes Patient able to express need for assistance with ADLs?: Yes Independently performs ADLs?: Yes (appropriate for developmental age)  Prior Inpatient Therapy Prior Inpatient Therapy: Yes Prior Therapy Dates: 03/2018 & 02/2018 Prior Therapy Facilty/Provider(s): [facility] Reason for Treatment: Schizoaffective & Depression (Suicide attempt)  Prior Outpatient Therapy Prior Outpatient Therapy: Yes Prior Therapy Dates: 09/2018- Prior Therapy Facilty/Provider(s): Patient and mother could not remember the name. Reason for Treatment: Schizoaffective-Received Peer Support Does patient have an ACCT team?: Unknown Does patient have Intensive In-House Services?  : Unknown Does patient have [facility] services? : Unknown Does patient have P4CC services?: Unknown  ADL Screening (condition at time of admission) Patient's cognitive ability adequate to safely complete daily activities?: Yes Patient able to express need for assistance with ADLs?: Yes Independently performs ADLs?: Yes (appropriate for developmental age)       Abuse/Neglect Assessment (Assessment to be complete while patient is alone) Abuse/Neglect Assessment Can Be Completed: Unable to assess, patient is non-responsive or altered mental status Values / Beliefs Cultural Requests During Hospitalization: None Spiritual Requests During Hospitalization: None Consults Spiritual  Care Consult Needed: No Transition of Care Team Consult Needed: No Advance Directives (For Healthcare)  Does Patient Have a Medical Advance Directive?: No Would patient like information on creating a medical advance directive?: No - Patient declined       Child/Adolescent Assessment Running Away Risk: (Unknown) Bed-Wetting: (Unknown) Destruction of Property: (Unknown) Cruelty to Animals: (Unknown) Stealing: (Unknown) Rebellious/Defies Authority: Admits Designer, industrial/product as Evidenced By: (Runs from police, took a Psychologist, prison and probation services gun) Satanic Involvement: (Unknown) Fire Setting: (Unknown) Problems at [employer]: (Unknown) Gang Involvement: (Unknown)  Disposition:  Disposition Initial Assessment Completed for this Encounter: Yes Patient referred to: Other (Comment)(Inpatient)  On Site Evaluation by:   Reviewed with Physician:    Willene Hatchet, MS., [facility], [facility] 05/23/2019 2:15 AM

## 2019-05-23 NOTE — ED Notes (Signed)
Pt. Alert and oriented, warm and dry, in no distress. Pt. Denies SI, HI, and AVH. Pt. Encouraged to let nursing staff know of any concerns or needs. 

## 2019-05-23 NOTE — BH Assessment (Signed)
Writer called [facility] regarding open beds. Writer was informed there were a few beds open and that TTS would be called back once they review patients chart.  

## 2019-05-23 NOTE — ED Provider Notes (Signed)
Emergency Medicine Observation Re-evaluation Note  [name] is a 29 y.o. male, seen on rounds today.  Pt initially presented to the ED for complaints of Suicidal Currently, the patient is resting in no acute distress.  Physical Exam  BP (!) 102/58 (BP Location: Left Arm)   Pulse 94   Temp 98.4 F (36.9 C) (Oral)   Resp 16   Ht 5\' 10"  (1.778 m)   Wt 68 kg   SpO2 97%   BMI 21.52 kg/m  Physical Exam  ED Course / MDM  EKG:    I have reviewed the labs performed to date as well as medications administered while in observation.  Recent changes in the last 24 hours include no events overnight. Plan  Current plan is for psychiatric disposition. Patient is under full IVC at this time.   , MD 05/23/19 [phone]

## 2019-05-23 NOTE — ED Notes (Signed)
Behavioral intake at the bedside for pt evaluation

## 2019-05-23 NOTE — ED Notes (Signed)
IVC/PENDING PSYCH CONSULT 

## 2019-05-23 NOTE — Consult Note (Signed)
I met with the patient at his bedside and reviewed the note from Ms. Thompson from her evaluation at 12 am early this morning.  Dr. Dwyane Dee attested that note and at that point felt that inpatient admission was appropriate for further stabilization and treatment.  He has a prior evaluation in later April and at that point showed no sign of hallucinations.  He apparently signed out AMA before I had a chance to see him.  Prior to then he was seen in late March by Ms. Dixon where he was very somnolent and then he was subsequently seen by Mr. Yisroel Ramming.  At that time he had walked in front of a car however the team did not feel this was due to psychiatric illness.  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  My evaluation today follows similar assessments that he is typically very somnolent even though it was much later in the morning.  He denied serious symptoms at this time and did not appear to be suffering from hallucinations or delusions.  Nor did he identify any lethality or any intent to harm himself or others.  Despite multiple requests he has refused to provide a urine sample and the lack of toxicological information is also seen in his previous assessments in April and in March.  In order to provide the least restrictive environment meant for him and to better understand the trajectory of his illness I recommend that we keep him in the emergency department for further observation before making a decision to admit.  If this experience is indeed cocaine influence then we will likely see continued resolution of symptoms and he can be discharged from the emergency department.

## 2019-05-23 NOTE — ED Notes (Signed)
IVC/Pending Disposition after continued observation

## 2019-05-23 NOTE — ED Notes (Signed)
Patient's mother - Foster Simpson [phone], would like to be informed of what hospital the pateint is referred to and the hospital contact information.  TTS informed the patient's nurse - Sue Lush

## 2019-05-23 NOTE — ED Notes (Signed)
Referral information for Psychiatric Hospitalization faxed to;    . [facility] ([phone], [phone], [phone] or [phone]),   . [facility] ([phone] or [phone])  . [facility] ([phone]),   . [facility] ([phone] -or- [phone]),   . [facility] ([phone])  . [facility] 

## 2019-05-23 NOTE — Consult Note (Signed)
[facility] Consult   Reason for Consult: SI Referring Physician: Dr. Beather Arbour Patient Identification: [name] MRN:  [id number] Principal Diagnosis: <principal problem not specified> Diagnosis:  Active Problems:   Cannabis abuse   Cocaine abuse with cocaine-induced mood disorder ([place])   Agitation   Suicide ideation   Self-inflicted laceration of right wrist ([place])   Cocaine abuse with intoxication ([place])   Schizophrenia, paranoid type ([place])   Total Time spent with patient: 15 minutes  Subjective: " The spirit of God got me here." [name] is a 29 y.o. male patient presented to [facility] ED via law enforcement under involuntary commitment status (IVC). The patient is quite sleepy when this provider initially approached him.  He was then awakened by Ms. Pratt TTS counselor and was able to answer most questions.  Per the ED triage nursing note, the patient admits to auditory hallucinations. The patient disclosed the voices are telling him to run out into traffic. The patient is quite sleepy in the ED but can provide some history. The patient was seen face-to-face by this provider; chart reviewed and consulted with Dr. Beather Arbour on 05/23/2019 due to the patient's care. EDP discussed that the patient does meet the criteria to be admitted to the psychiatric inpatient unit.  On evaluation, the patient is alert and oriented x 2-3; initially, he was challenging to arouse but once awaken, he became cooperative and mood-congruent with affect.  The patient does admit to internal stimuli.  He is presenting with some delusional thinking--the patient denies auditory and visual hallucinations. The patient denies any suicidal, homicidal, or self-harm ideations. The patient is presenting with some psychotic and paranoid behaviors. During an encounter with the patient, he was unable to answer most questions appropriately.  Plan: The patient is a safety risk to himself and does require psychiatric  inpatient admission for stabilization and treatment.  HPI: Past Psychiatric History:  Risk to Self: Suicidal Ideation: No Suicidal Intent: No Is patient at risk for suicide?: No Suicidal Plan?: No Access to Means: No How many times?: (Unknown) Other Self Harm Risks: (Unknown) Triggers for Past Attempts: None known Intentional Self Injurious Behavior: (Walks into traffic) Risk to Others: Homicidal Ideation: No Thoughts of Harm to Others: No Current Homicidal Intent: No Current Homicidal Plan: No Access to Homicidal Means: No Describe Access to Homicidal Means: (n/a) History of harm to others?: No Assessment of Violence: None Noted Does patient have access to weapons?: No Criminal Charges Pending?: (Has a court date coming up ) Does patient have a court date: Yes Court Date: (Unknown) Prior Inpatient Therapy: Prior Inpatient Therapy: Yes Prior Therapy Dates: 03/2018 & 02/2018 Prior Therapy Facilty/Provider(s): [facility] Reason for Treatment: Schizoaffective & Depression (Suicide attempt) Prior Outpatient Therapy: Prior Outpatient Therapy: Yes Prior Therapy Dates: 09/2018- Prior Therapy Facilty/Provider(s): Patient and mother could not remember the name. Reason for Treatment: Schizoaffective-Received Peer Support Does patient have an ACCT team?: Unknown Does patient have Intensive In-House Services?  : Unknown Does patient have [facility] services? : Unknown Does patient have P4CC services?: Unknown  Past Medical History:  Past Medical History:  Diagnosis Date  . Schizo affective schizophrenia ([place])   . Schizophrenia ([place])    History reviewed. No pertinent surgical history. Family History: History reviewed. No pertinent family history. Family Psychiatric  History: Social History:  Social History   Substance and Sexual Activity  Alcohol Use Not Currently     Social History   Substance and  Sexual Activity  Drug Use Yes  .  Types: Cocaine, Marijuana   Comment: Cocaine used 05/21/19    Social History   Socioeconomic History  . Marital status: Single    Spouse name: Not on file  . Number of children: Not on file  . Years of education: Not on file  . Highest education level: Not on file  Occupational History  . Not on file  Tobacco Use  . Smoking status: Current Every Day Smoker  . Smokeless tobacco: Never Used  Substance and Sexual Activity  . Alcohol use: Not Currently  . Drug use: Yes    Types: Cocaine, Marijuana    Comment: Cocaine used 05/21/19  . Sexual activity: Not on file  Other Topics Concern  . Not on file  Social History Narrative  . Not on file   Social Determinants of Health   Financial Resource Strain:   . Difficulty of Paying Living Expenses:   Food Insecurity:   . Worried About Programme researcher, broadcasting/film/video in the Last Year:   . Barista in the Last Year:   Transportation Needs:   . Freight forwarder (Medical):   Marland Kitchen Lack of Transportation (Non-Medical):   Physical Activity:   . Days of Exercise per Week:   . Minutes of Exercise per Session:   Stress:   . Feeling of Stress :   Social Connections:   . Frequency of Communication with Friends and Family:   . Frequency of Social Gatherings with Friends and Family:   . Attends Religious Services:   . Active Member of Clubs or Organizations:   . Attends Banker Meetings:   Marland Kitchen Marital Status:    Additional Social History:    Allergies:   Allergies  Allergen Reactions  . Ibuprofen Swelling    Tongue swelling  . Risperidone And Related Other (See Comments) and Swelling    gynecomastia gynecomastia   . Ziprasidone Swelling    Tongue swells  . Quetiapine Other (See Comments)    Depression, suicidality, adverse effect: seizures     Labs:  Results for orders placed or performed during the hospital encounter of 05/22/19 (from the past 48 hour(s))  Comprehensive metabolic panel     Status: Abnormal    Collection Time: 05/22/19  9:55 PM  Result Value Ref Range   Sodium 140 135 - 145 mmol/L   Potassium 3.7 3.5 - 5.1 mmol/L   Chloride 106 98 - 111 mmol/L   CO2 25 22 - 32 mmol/L   Glucose, Bld 106 (H) 70 - 99 mg/dL    Comment: Glucose reference range applies only to samples taken after fasting for at least 8 hours.   BUN 15 6 - 20 mg/dL   Creatinine, Ser 4.31 0.61 - 1.24 mg/dL   Calcium 9.4 8.9 - 54.0 mg/dL   Total Protein 8.3 (H) 6.5 - 8.1 g/dL   Albumin 4.8 3.5 - 5.0 g/dL   AST 17 15 - 41 U/L   ALT 13 0 - 44 U/L   Alkaline Phosphatase 63 38 - 126 U/L   Total Bilirubin 0.8 0.3 - 1.2 mg/dL   GFR calc non Af Amer >60 >60 mL/min   GFR calc Af Amer >60 >60 mL/min   Anion gap 9 5 - 15    Comment: Performed at [facility], [address] [place], [place] [address]  Ethanol     Status: None   Collection Time: 05/22/19  9:55 PM  Result Value Ref Range   Alcohol, Ethyl (B) <10 <10 mg/dL    Comment: (NOTE) Lowest detectable limit for serum alcohol is 10 mg/dL. For medical purposes only. Performed at [facility], [address] [place], [place] [address]   Salicylate level     Status: Abnormal   Collection Time: 05/22/19  9:55 PM  Result Value Ref Range   Salicylate Lvl <7.0 (L) 7.0 - 30.0 mg/dL    Comment: Performed at [facility], [address] [place], [place] [address]  Acetaminophen level     Status: Abnormal   Collection Time: 05/22/19  9:55 PM  Result Value Ref Range   Acetaminophen (Tylenol), Serum <10 (L) 10 - 30 ug/mL    Comment: (NOTE) Therapeutic concentrations vary significantly. A range of 10-30 ug/mL  may be an effective concentration for many patients. However, some  are best treated at concentrations outside of this range. Acetaminophen concentrations >150 ug/mL at 4 hours after ingestion  and >50 ug/mL at 12 hours after ingestion are often associated with  toxic reactions. Performed at [facility], [address] [place], [place] [address]   cbc     Status: None   Collection Time: 05/22/19  9:55 PM  Result Value Ref Range   WBC 6.4 4.0 - 10.5 K/uL   RBC 4.95 4.22 - 5.81 MIL/uL   Hemoglobin 14.7 13.0 - 17.0 g/dL   HCT 98.9 21.1 - 94.1 %   MCV 85.3 80.0 - 100.0 fL   MCH 29.7 26.0 - 34.0 pg   MCHC 34.8 30.0 - 36.0 g/dL   RDW 74.0 81.4 - 48.1 %   Platelets 244 150 - 400 K/uL   nRBC 0.0 0.0 - 0.2 %    Comment: Performed at [facility], [address] [place], [place] [address]    No current facility-administered medications for this encounter.   Current Outpatient Medications  Medication Sig Dispense Refill  . haloperidol decanoate (HALDOL DECANOATE) 100 MG/ML injection Inject 1 mL into the muscle every 28 (twenty-eight) days.      Psychiatric Specialty Exam: Physical Exam  Nursing note and vitals reviewed. Constitutional: He is oriented to person, place, and time. He appears well-developed.  Respiratory: Effort normal.  Musculoskeletal:        General: Normal range of motion.     Cervical back: Normal range of motion and neck supple.  Neurological: He is alert and oriented to person, place, and time.    Review of Systems  Psychiatric/Behavioral: Positive for agitation, confusion, hallucinations and suicidal ideas.  All other systems reviewed and are negative.   Blood pressure (!) 102/58, pulse 94, temperature 98.4 F (36.9 C), temperature source Oral, resp. rate 16, height 5\' 10"  (1.778 m), weight 68 kg, SpO2 97 %.Body mass index is 21.52 kg/m.  General Appearance: Disheveled  Eye Contact:  Poor  Speech:  Garbled  Volume:  Decreased  Mood:  Depressed  Affect:  Flat  Thought Process:  Linear  Orientation:  Full (Time, Place, and Person)  Thought Content:  Hallucinations: Auditory  Suicidal Thoughts:  Yes.  without intent/plan  Homicidal Thoughts:  No  Memory:  Immediate;   Fair  Judgement:  Poor  Insight:  Lacking  Psychomotor Activity:  Decreased   Concentration:  Concentration: Poor  Recall:  of Knowledge:  Fair  Language:  Fair  Akathisia:  No  Handed:  Ambidextrous  AIMS (if indicated):     Assets:  Others:  No obvious identified  ADL's:  Impaired  Cognition:  Impaired,  Moderate  Sleep:        Treatment Plan Summary: Plan The patient meets criteria for psychiatric inpatient admission.  Disposition: The patient meets criteria for psychiatric inpatient admission  Gillermo Murdoch, NP 05/23/2019 3:06 AM

## 2019-05-23 NOTE — ED Notes (Signed)
IVC/PENDING PSYCH ADMIT

## 2019-05-23 NOTE — ED Notes (Signed)
He has become irate towards another pt and [employer] officer   Verbalizing threats  - banging his head against the wall  MD made aware and MD could hear him banging his head   IM injections ordered for administration

## 2019-05-24 NOTE — ED Notes (Signed)
Pt denies SI/HI/AVH on assessment. Pt asking when he can leave. Advised him that he has to be seen by psychiatry to determine plan of care. Pt verbalized understanding.

## 2019-05-24 NOTE — ED Provider Notes (Signed)
Emergency Medicine Observation Re-evaluation Note  [name] is a 29 y.o. male, seen on rounds today.  Pt initially presented to the ED for complaints of Suicidal Currently, the patient is calm.  Physical Exam  BP (!) 98/44 (BP Location: Left Arm)   Pulse 61   Temp 98.3 F (36.8 C) (Oral)   Resp 16   Ht 5\' 10"  (1.778 m)   Wt 68 kg   SpO2 99%   BMI 21.52 kg/m  Physical Exam  ED Course / MDM  EKG:    I have reviewed the labs performed to date as well as medications administered while in observation.  Recent changes in the last 24 hours include none. Plan  Current plan is for psych dispo. Patient is under full IVC at this time.   , MD 05/24/19 [id number]

## 2019-05-24 NOTE — ED Notes (Signed)
Pt signed paper discharge form 

## 2019-05-24 NOTE — ED Notes (Signed)
Pt ready for discharge. Discharge teaching and follow-up care done with pt and he verbalized understanding. Pt did state to writer not to let his mother know that he is being discharged if she calls. Pt advised since he is an adult, he gets to make the decision of who he will talk to about his care here and staff will not talk to her without his consent. Ambulating with steady gait. Escorted to lobby in NAD.

## 2019-05-24 NOTE — ED Notes (Signed)
IVC/PENDING DISPO 

## 2019-05-24 NOTE — Consult Note (Signed)
I met with the patient and reviewed the notes.  The goal of the past 24 hours was to better understand progression of his illness and the potential cause of his symptomatology prior to the admission.  The initial impression was of a psychotic illness that could be treated on an inpatient basis.  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  In the 2 times I have seen him he has been clear of any clear evidence of hallucinations or delusions although when he was first seen by nurses he said he was hearing voices.  Worsening psychosis and his initial presentation are clearly related to substance use as shown by this admission and prior admissions.  And as before he has cleared.  I discussed the cocaine use with the patient and he said that he has been using cocaine for a long time and this was not the cause of his problem.  This sadly would point to the reality that continued cocaine use is likely to exist leading to repeat admissions.  I asked him to explain why he had an outburst like he did last night and he said that he has a very short fuse and this problem goes back to when he was very young and he gave the age of 29 years of age.  He said that his strategy would be to try and control those anger outbursts.  Currently he denies any symptoms and denies that he has any potential risk of harm to himself or other lethality or suicidality or homicidality.  I do not believe that he fulfills the criteria for involuntary commitment and thus I rescinded the IVC.  He remains at high risk for repeat of the symptoms which have caused the last few admissions to the emergency department however I do not see how inpatient treatment can change his risk factors since they are clearly related to substance use and he currently has no substantial psychiatric symptomatology.  He  requested a discharge and I agree that he can be discharged for outpatient follow-up of his psychiatric condition.  Given the likelihood of repeat evaluation in the emergency department I recommend that prior to any decision be made that he be repeatedly asked to provide a urine sample and if he refuses a urine sample that it be assumed that there will will likely be cocaine in his urine.  With either the finding or that assumption I recommend an extended stay in the emergency department so that he can be observed over time before decision can be made since our data to date is that the psychosis resolves rapidly consistent with washout of a substance and thus resolution of substance-induced psychosis.
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
## 2019-05-31 ENCOUNTER — Emergency Department
Admission: EM | Admit: 2019-05-31 | Discharge: 2019-05-31 | Discharge status: Against Medical Advice | Payer: Medicare Other

## 2019-05-31 NOTE — ED Notes (Signed)
Called one time per this RN with no response

## 2019-06-26 ENCOUNTER — Emergency Department
Admission: EM | Admit: 2019-06-26 | Discharge: 2019-06-27 | Disposition: A | Discharge status: Other Acute Inpatient Hospital | Payer: Medicare Other | Attending: Emergency Medicine | Admitting: Emergency Medicine

## 2019-06-26 ENCOUNTER — Other Ambulatory Visit: Payer: Self-pay

## 2019-06-26 ENCOUNTER — Emergency Department: Payer: Medicare Other

## 2019-06-26 DIAGNOSIS — Z7289 Other problems related to lifestyle: Secondary | ICD-10-CM | POA: Insufficient documentation

## 2019-06-26 DIAGNOSIS — Z79899 Other long term (current) drug therapy: Secondary | ICD-10-CM | POA: Insufficient documentation

## 2019-06-26 DIAGNOSIS — F1910 Other psychoactive substance abuse, uncomplicated: Secondary | ICD-10-CM | POA: Insufficient documentation

## 2019-06-26 DIAGNOSIS — R400 Somnolence: Secondary | ICD-10-CM | POA: Insufficient documentation

## 2019-06-26 DIAGNOSIS — F17200 Nicotine dependence, unspecified, uncomplicated: Secondary | ICD-10-CM | POA: Insufficient documentation

## 2019-06-26 DIAGNOSIS — F200 Paranoid schizophrenia: Secondary | ICD-10-CM | POA: Insufficient documentation

## 2019-06-26 DIAGNOSIS — Z20822 Contact with and (suspected) exposure to covid-19: Secondary | ICD-10-CM | POA: Insufficient documentation

## 2019-06-26 DIAGNOSIS — Z046 Encounter for general psychiatric examination, requested by authority: Secondary | ICD-10-CM | POA: Insufficient documentation

## 2019-06-26 LAB — CBC
HCT: 39.2 % (ref 39.0–52.0)
Hemoglobin: 14.2 g/dL (ref 13.0–17.0)
MCH: 29.8 pg (ref 26.0–34.0)
MCHC: 36.2 g/dL — ABNORMAL HIGH (ref 30.0–36.0)
MCV: 82.4 fL (ref 80.0–100.0)
Platelets: 211 10*3/uL (ref 150–400)
RBC: 4.76 MIL/uL (ref 4.22–5.81)
RDW: 12.3 % (ref 11.5–15.5)
WBC: 4.6 10*3/uL (ref 4.0–10.5)
nRBC: 0 % (ref 0.0–0.2)

## 2019-06-26 LAB — COMPREHENSIVE METABOLIC PANEL
ALT: 22 U/L (ref 0–44)
AST: 31 U/L (ref 15–41)
Albumin: 4.4 g/dL (ref 3.5–5.0)
Alkaline Phosphatase: 59 U/L (ref 38–126)
Anion gap: 6 (ref 5–15)
BUN: 13 mg/dL (ref 6–20)
CO2: 26 mmol/L (ref 22–32)
Calcium: 9.1 mg/dL (ref 8.9–10.3)
Chloride: 109 mmol/L (ref 98–111)
Creatinine, Ser: 1.1 mg/dL (ref 0.61–1.24)
GFR calc Af Amer: >60 mL/min (ref >60)
GFR calc non Af Amer: >60 mL/min (ref >60)
Glucose, Bld: 91 mg/dL (ref 70–99)
Potassium: 3.7 mmol/L (ref 3.5–5.1)
Sodium: 141 mmol/L (ref 135–145)
Total Bilirubin: 0.7 mg/dL (ref 0.3–1.2)
Total Protein: 7.5 g/dL (ref 6.5–8.1)

## 2019-06-26 LAB — ETHANOL: Alcohol, Ethyl (B): <10 mg/dL (ref <10)

## 2019-06-26 NOTE — BH Assessment (Signed)
TTS attempted to complete assessment 2 x's. Pt refused to participate in assessment, stated "I do not feel like talking" and went mute.
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
## 2019-06-26 NOTE — ED Notes (Signed)
Patient is going to be transferred via w/c to room 4 in [place], Nurse reported to [employer]. Patient's belongings are in the belongings room over near the [place].

## 2019-06-26 NOTE — ED Notes (Signed)
Patient ate 100% of lunch and beverage, Patient is pleasant , cooperative, and told nurse " I love you" , Nurse will continue to monitor.

## 2019-06-26 NOTE — ED Notes (Signed)
Pt dressed out into hospital scrubs. Pt's belongings to include: 1 black shirt 1 black underwear 2 black pants 2 tan socks 1 white sweatshirt Several records 2 black and white shoes 1 black phone 1 black visa debit card Assortment of papers

## 2019-06-26 NOTE — ED Notes (Signed)
Nurse attempted to assess Patient and He states " no disrespect mam, I just want to sleep for now" Nurse turned light out and left the room, let him she would come back to speak to him.

## 2019-06-26 NOTE — ED Notes (Signed)
Taking pt to CT with security.

## 2019-06-26 NOTE — ED Notes (Signed)
IVC  PENDING  [facility]
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
## 2019-06-26 NOTE — ED Notes (Addendum)
Pt denies SI/HI/AVH on assessment. Pt states he just wants to get some dinner and sleep. Advised dinner is not here yet, and he will get a tray once dinner gets here.

## 2019-06-27 ENCOUNTER — Inpatient Hospital Stay
Admission: AD | Admit: 2019-06-27 | Discharge: 2019-06-29 | DRG: 885 | Disposition: A | Discharge status: Home / Self Care | Payer: Medicare Other | Source: Intra-hospital | Attending: Psychiatry | Admitting: Psychiatry

## 2019-06-27 DIAGNOSIS — F1494 Cocaine use, unspecified with cocaine-induced mood disorder: Secondary | ICD-10-CM | POA: Diagnosis present

## 2019-06-27 DIAGNOSIS — F1414 Cocaine abuse with cocaine-induced mood disorder: Secondary | ICD-10-CM | POA: Diagnosis present

## 2019-06-27 DIAGNOSIS — Z20822 Contact with and (suspected) exposure to covid-19: Secondary | ICD-10-CM | POA: Diagnosis present

## 2019-06-27 DIAGNOSIS — F17200 Nicotine dependence, unspecified, uncomplicated: Secondary | ICD-10-CM | POA: Diagnosis present

## 2019-06-27 DIAGNOSIS — F259 Schizoaffective disorder, unspecified: Secondary | ICD-10-CM | POA: Diagnosis present

## 2019-06-27 DIAGNOSIS — F1520 Other stimulant dependence, uncomplicated: Secondary | ICD-10-CM | POA: Diagnosis present

## 2019-06-27 DIAGNOSIS — F250 Schizoaffective disorder, bipolar type: Secondary | ICD-10-CM | POA: Diagnosis not present

## 2019-06-27 LAB — SARS CORONAVIRUS 2 BY RT PCR (HOSPITAL ORDER, PERFORMED IN ~~LOC~~ HOSPITAL LAB): SARS Coronavirus 2: NEGATIVE

## 2019-06-27 MED ORDER — THIOTHIXENE 2 MG PO CAPS
2.0000 mg | ORAL_CAPSULE | Freq: Two times a day (BID) | ORAL | Status: DC
  Administered 2019-06-27: 2 mg via ORAL
  Filled 2019-06-27 (×2): qty 1

## 2019-06-27 MED ORDER — HYDROXYZINE HCL 10 MG PO TABS: 10.0000 mg | ORAL_TABLET | Freq: Three times a day (TID) | ORAL | Status: DC | PRN

## 2019-06-27 MED ORDER — ALUM & MAG HYDROXIDE-SIMETH 200-200-20 MG/5ML PO SUSP: 30.0000 mL | ORAL | Status: DC | PRN

## 2019-06-27 MED ORDER — ACETAMINOPHEN 325 MG PO TABS: 650.0000 mg | ORAL_TABLET | Freq: Four times a day (QID) | ORAL | Status: DC | PRN

## 2019-06-27 MED ORDER — THIOTHIXENE 1 MG PO CAPS
1.0000 mg | ORAL_CAPSULE | Freq: Two times a day (BID) | ORAL | Status: DC
  Filled 2019-06-27 (×5): qty 1

## 2019-06-27 MED ORDER — OLANZAPINE 5 MG PO TABS
5.0000 mg | ORAL_TABLET | Freq: Every day | ORAL | Status: DC
  Administered 2019-06-27: 5 mg via ORAL
  Filled 2019-06-27: qty 1

## 2019-06-27 MED ORDER — OLANZAPINE 10 MG IM SOLR: 5.0000 mg | Freq: Once | INTRAMUSCULAR | Status: DC

## 2019-06-27 MED ORDER — MAGNESIUM HYDROXIDE 400 MG/5ML PO SUSP: 30.0000 mL | Freq: Every day | ORAL | Status: DC | PRN

## 2019-06-27 MED ORDER — DULOXETINE HCL 20 MG PO CPEP
20.0000 mg | ORAL_CAPSULE | Freq: Every day | ORAL | Status: DC
  Filled 2019-06-27: qty 1

## 2019-06-27 MED ORDER — TRIHEXYPHENIDYL HCL 2 MG PO TABS
1.0000 mg | ORAL_TABLET | Freq: Two times a day (BID) | ORAL | Status: DC
  Filled 2019-06-27: qty 1

## 2019-06-27 NOTE — Consult Note (Signed)
[facility] Face-to-Face Psychiatry Consult   Reason for Consult:  IVC need for inpatient admission Referring Physician:  ED MD Patient Identification: [name] MRN:  [id number] Principal Diagnosis: Schizoaffective disorder   Diagnosis:  Schizoaffective Disorder  Cocaine Dependence    Total Time spent with patient: 30-40 minutes  Subjective:   [name] is a 29 y.o. male patient admitted with  History of schizoaffective disorder off medications at least a few weeks, along with issues with cocaine dependence ---homeless now here for admission.  Cannot cope has depressed mood, crying spells, hopeless helpless feelings, lack of energy, worth, motivation concentration -- SI and possible --plans -cannot stay safe  Paranoid, hears voices --illogical and disorganized under stress, has lack of sleep, some anhedonia as well as various highs and lows moos swings, ups and downs, lability.  Hard to discern because he also has cocaine dependence on regular basis as well    HPI:  See above  Past Psychiatric History:   Outpatient care but does not seem consistent due to substance and chronic psychosocial pressures.    Has been admitted prior but is vague on timing and where.      Risk to Self:  currently says he active wants to harm self  --wants to possibly take tablets or OD of illicit drugs he says  Risk to Others:   currently   Prior Inpatient Therapy:  see above  Prior Outpatient Therapy:  none recently   Past Medical History:  Past Medical History:  Diagnosis Date  . Schizo affective schizophrenia (HCC)   . Schizophrenia (HCC)    History reviewed. No pertinent surgical history. Family History: No family history on file. Family Psychiatric  History: currently vague ---estranged from family but his Mom did call --and will give collateral  Social History:  Social History   Substance and Sexual Activity  Alcohol Use Not Currently     Social History   Substance and  Sexual Activity  Drug Use Yes  . Types: Cocaine, Marijuana   Comment: crack 06-26-19    Social History   Socioeconomic History  . Marital status: Single    Spouse name: Not on file  . Number of children: Not on file  . Years of education: Not on file  . Highest education level: Not on file  Occupational History  . Not on file  Tobacco Use  . Smoking status: Current Every Day Smoker  . Smokeless tobacco: Never Used  Substance and Sexual Activity  . Alcohol use: Not Currently  . Drug use: Yes    Types: Cocaine, Marijuana    Comment: crack 06-26-19  . Sexual activity: Not on file  Other Topics Concern  . Not on file  Social History Narrative  . Not on file   Social Determinants of Health   Financial Resource Strain:   . Difficulty of Paying Living Expenses:   Food Insecurity:   . Worried About Programme researcher, broadcasting/film/video in the Last Year:   . Barista in the Last Year:   Transportation Needs:   . Freight forwarder (Medical):   Marland Kitchen Lack of Transportation (Non-Medical):   Physical Activity:   . Days of Exercise per Week:   . Minutes of Exercise per Session:   Stress:   . Feeling of Stress :   Social Connections:   . Frequency of Communication with Friends and Family:   . Frequency of Social Gatherings with Friends and Family:   . Attends Religious  Services:   . Active Member of Clubs or Organizations:   . Attends Archivist Meetings:   Marland Kitchen Marital Status:    Additional Social History:  Homeless over some months --feels stress and strain on this and is off medications     Allergies:   Allergies  Allergen Reactions  . Ibuprofen Swelling    Tongue swelling  . Risperidone And Related Other (See Comments) and Swelling    gynecomastia gynecomastia   . Ziprasidone Swelling    Tongue swells  . Benztropine Other (See Comments)    Causes confusion, depression, and delusions  . Quetiapine Other (See Comments)    Depression, suicidality, adverse effect:  seizures     Labs:  Results for orders placed or performed during the hospital encounter of 06/26/19 (from the past 48 hour(s))  Comprehensive metabolic panel     Status: None   Collection Time: 06/26/19 11:27 AM  Result Value Ref Range   Sodium 141 135 - 145 mmol/L   Potassium 3.7 3.5 - 5.1 mmol/L   Chloride 109 98 - 111 mmol/L   CO2 26 22 - 32 mmol/L   Glucose, Bld 91 70 - 99 mg/dL    Comment: Glucose reference range applies only to samples taken after fasting for at least 8 hours.   BUN 13 6 - 20 mg/dL   Creatinine, Ser 1.10 0.61 - 1.24 mg/dL   Calcium 9.1 8.9 - 10.3 mg/dL   Total Protein 7.5 6.5 - 8.1 g/dL   Albumin 4.4 3.5 - 5.0 g/dL   AST 31 15 - 41 U/L   ALT 22 0 - 44 U/L   Alkaline Phosphatase 59 38 - 126 U/L   Total Bilirubin 0.7 0.3 - 1.2 mg/dL   GFR calc non Af Amer >60 >60 mL/min   GFR calc Af Amer >60 >60 mL/min   Anion gap 6 5 - 15    Comment: Performed at [facility], [address], [place], [place] [address]  Ethanol     Status: None   Collection Time: 06/26/19 11:27 AM  Result Value Ref Range   Alcohol, Ethyl (B) <10 <10 mg/dL    Comment: (NOTE) Lowest detectable limit for serum alcohol is 10 mg/dL.  For medical purposes only. Performed at [facility], [address] [place], [place] [address]   cbc     Status: Abnormal   Collection Time: 06/26/19 11:27 AM  Result Value Ref Range   WBC 4.6 4.0 - 10.5 K/uL   RBC 4.76 4.22 - 5.81 MIL/uL   Hemoglobin 14.2 13.0 - 17.0 g/dL   HCT 39.2 39 - 52 %   MCV 82.4 80.0 - 100.0 fL   MCH 29.8 26.0 - 34.0 pg   MCHC 36.2 (H) 30.0 - 36.0 g/dL   RDW 12.3 11.5 - 15.5 %   Platelets 211 150 - 400 K/uL   nRBC 0.0 0.0 - 0.2 %    Comment: Performed at [facility], [address] [place], [place] [address]    Current Facility-Administered Medications  Medication Dose Route Frequency Provider Last Rate Last Admin  . DULoxetine (CYMBALTA) DR capsule 20 mg  20 mg Oral Daily Eulas Post, MD      . OLANZapine [facility]) injection 5 mg  5 mg Intramuscular Once Eulas Post, MD      . thiothixene (NAVANE) capsule 1 mg  1 mg Oral BID Eulas Post, MD      . trihexyphenidyl (ARTANE) tablet 1  mg  1 mg Oral BID WC Roselind Messier, MD       Current Outpatient Medications  Medication Sig Dispense Refill  . haloperidol (HALDOL) 5 MG tablet Take 5 mg by mouth 2 (two) times daily.      Musculoskeletal: Strength & Muscle Tone: normal  Gait & Station:  Normal  Patient leans: n/a   Psychiatric Specialty Exam: Physical Exam  Per ED   Review of Systems  Blood pressure 104/82, pulse 78, temperature 98.9 F (37.2 C), temperature source Oral, resp. rate 18, height 5\' 10"  (1.778 m), weight 68 kg, SpO2 99 %.Body mass index is 21.52 kg/m.  General Appearance: haggard disheveled    Eye Contact:  Minimal ---may have paranoia   Speech:  Slightly low tone volume and fluency    Volume:  Low tone volume fluency   Mood:  Depressed affect constricted   Affect:  Somewhat flat   Thought Process:  Illogical somewhat disorganized --but no frank LOA FOI ---other related psych symptoms    Orientation:  Oriented times four   Thought Content:  Paranoid, fearful suspicious ---without frank A/ V hallucinations   Suicidal Thoughts:  Says he has SI and possible active plans to harm self   Homicidal Thoughts:  None   Memory:  Remote recent and immediate intact through general questions   Judgement:  Poor   Insight:  Poor   Psychomotor Activity:  Slow to some degree  Concentration:  Poor   Recall:  Not participating   Fund of Knowledge:  Poor   Language:  Normal  English   Akathisia:  None   Handed:  Right   AIMS (if indicated):    pending   Assets:  Mom is calling for him at least he is seeking treatment   ADL's:  Limited due to dysfunction homeless  Cognition:  Limited under influence of drugs   Sleep:   poor for all three cycles      Treatment Plan Summary:   AA  male with worsening mood and psychosis, off meds.  Poor historian unreliable currently homeless and influenced by cocaine.  However ---is staying and voicing Si and possible plans  Is on IVC at this time pending Psych admission  Mgt Includes --restart of medications, individual group and daily rounds CM assessment  Discharge planning and team meetings      Disposition: Inpatient admission at this time pending    , MD 06/27/2019 9:09 AM

## 2019-06-27 NOTE — Progress Notes (Signed)
Order from MD that Navane could be changed from 1 mg bid to 2 mg bid due to the 1 mg capsule not being available.
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
## 2019-06-27 NOTE — ED Notes (Signed)
Inpatient bed has been assigned - pt to transfer to bed 312    Spoke with [facility]  [name]  He reports that the pt may come down around 1700

## 2019-06-27 NOTE — BH Assessment (Addendum)
Patient can come down Now  Patient is to be admitted to [facility] BMU by Dr. Toni Amend.  Attending Physician will be. Dr. Toni Amend.   Patient has been assigned to room 312, by [facility] Charge Nurse Cherre Huger, RN.   Intake Paper Work has been signed and placed on patient chart.  ER staff is aware of the admission: 1. Misty Stanley, ER Secretary  2. Derrill Kay, ER MD  3. Amy Patient's Nurse  4. THO Patient Access.

## 2019-06-27 NOTE — ED Notes (Signed)
IVC  PENDING  GOING  TO  [facility] MED  PT  SEEN  BY  DR  RAO  MD

## 2019-06-27 NOTE — Progress Notes (Signed)
[name] is a 29 y.o. male with PMH as noted below including substance abuse and schizophrenia who presents under involuntary commitment for erratic behavior. The psychiatry team tried to complete the patient assessment. The patient refused to wake up  to participate in the assessment process.

## 2019-06-28 ENCOUNTER — Encounter: Payer: Self-pay | Admitting: Internal Medicine

## 2019-06-28 ENCOUNTER — Other Ambulatory Visit: Payer: Self-pay

## 2019-06-28 DIAGNOSIS — F250 Schizoaffective disorder, bipolar type: Secondary | ICD-10-CM

## 2019-06-28 LAB — TSH: TSH: 0.538 u[IU]/mL (ref 0.350–4.500)

## 2019-06-28 MED ORDER — DIVALPROEX SODIUM 500 MG PO DR TAB
500.0000 mg | DELAYED_RELEASE_TABLET | ORAL | Status: DC
  Administered 2019-06-29: 500 mg via ORAL
  Filled 2019-06-28: qty 1

## 2019-06-28 MED ORDER — ARIPIPRAZOLE 5 MG PO TABS
15.0000 mg | ORAL_TABLET | Freq: Every day | ORAL | Status: DC
  Administered 2019-06-28 – 2019-06-29 (×2): 15 mg via ORAL
  Filled 2019-06-28 (×2): qty 1

## 2019-06-28 NOTE — BHH Counselor (Signed)
CSW attempted to complete the pt's assessment, however the patient was uncooperative.    Pt acknowledged CSW as she entered the room and said "hello" however declined to speak further, despite prompting from CSW.    Pt was observed to be awake as evidenced by moving around in the bed, scratching his face, etc.  Penni Homans, MSW, LCSW 06/28/2019 10:37 AM

## 2019-06-28 NOTE — Plan of Care (Signed)
D: Pt alert and oriented x 4. Pt denies experiencing any anxiety/depression. Pt denies experiencing any pain at this time. Pt denies experiencing any SI/HI, or AVH at this time. Pt did leave room to eat lunch and stated he was still hungry. MHT reports pt ate breakfast, 2 trays at lunch plus fries. Meal times appears to be the only time the pt wants to leave his room and not sleep. This Clinical research associate had to coach pt to get out of bed for med pass and stay in the room to see the pt get up and come with this writer to get meds. Pt stated he just does like to have to keep getting up. Pt is irritable if he has to get up multiple times.  A: Scheduled medications administered to pt, per MD orders. Support and encouragement provided. Frequent verbal contact made. Routine safety checks conducted q15 minutes.   R: No adverse drug reactions noted. Pt verbally contracts for safety at this time. Pt complaint with medications and treatment plan. Pt interacts well with others on the unit. Pt remains safe at this time. Will continue to monitor.   Problem: Education: Goal: Knowledge of [facility] Education information/materials will improve Outcome: Not Progressing Goal: Emotional status will improve Outcome: Not Progressing Goal: Mental status will improve Outcome: Not Progressing Goal: Verbalization of understanding the information provided will improve Outcome: Not Progressing   Problem: Safety: Goal: Periods of time without injury will increase Outcome: Not Progressing   Problem: Education: Goal: Utilization of techniques to improve thought processes will improve Outcome: Not Progressing Goal: Knowledge of the prescribed therapeutic regimen will improve Outcome: Not Progressing   Problem: Safety: Goal: Ability to disclose and discuss suicidal ideas will improve Outcome: Not Progressing Goal: Ability to identify and utilize support systems that promote safety will improve Outcome: Not  Progressing

## 2019-06-28 NOTE — Progress Notes (Signed)
Recreation Therapy Notes    Date: 06/28/2019  Time: 9:30 am   Location: Craft room    Behavioral response: N/A   Intervention Topic: Decision-making   Discussion/Intervention: Patient did not attend group.   Clinical Observations/Feedback:  Patient did not attend group.   Betzaida Cremeens LRT/CTRS        Fatemah Pourciau 06/28/2019 11:51 AM
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
## 2019-06-28 NOTE — Plan of Care (Signed)
Patient new to the unit tonight, hasn't had time to progress  Problem: Education: Goal: Knowledge of [place] [facility] Education information/materials will improve Outcome: Not Progressing Goal: Emotional status will improve Outcome: Not Progressing Goal: Mental status will improve Outcome: Not Progressing Goal: Verbalization of understanding the information provided will improve Outcome: Not Progressing   Problem: Safety: Goal: Periods of time without injury will increase Outcome: Not Progressing   Problem: Education: Goal: Utilization of techniques to improve thought processes will improve Outcome: Not Progressing Goal: Knowledge of the prescribed therapeutic regimen will improve Outcome: Not Progressing   Problem: Safety: Goal: Ability to disclose and discuss suicidal ideas will improve Outcome: Not Progressing Goal: Ability to identify and utilize support systems that promote safety will improve Outcome: Not Progressing   

## 2019-06-28 NOTE — Tx Team (Signed)
Initial Treatment Plan 06/28/2019 3:31 AM [name] MKL:[id number]    PATIENT STRESSORS: Financial difficulties Medication change or noncompliance   PATIENT STRENGTHS: Ability for insight Motivation for treatment/growth   PATIENT IDENTIFIED PROBLEMS: Depression  Suicidal Ideation  Substance Abuse                 DISCHARGE CRITERIA:  Improved stabilization in mood, thinking, and/or behavior Verbal commitment to aftercare and medication compliance  PRELIMINARY DISCHARGE PLAN: Outpatient therapy Placement in alternative living arrangements  PATIENT/FAMILY INVOLVEMENT: This treatment plan has been presented to and reviewed with the patient, [name]. The patient has been given the opportunity to ask questions and make suggestions.  Elmyra Ricks, RN 06/28/2019, 3:31 AM

## 2019-06-28 NOTE — BHH Counselor (Signed)
CSW attempted again to complete the patient's assessment.  Patient was agitated. CSW will attempt later.  Penni Homans, MSW, LCSW 06/28/2019 3:50 PM

## 2019-06-28 NOTE — Progress Notes (Signed)
Patient admitted from [facility], report received from [place], [place]. Patient irritable upon assessment denying SI/HI/AVH with this Clinical research associate. Patient presents in a pleasant but irritable affect stating, "I just want to go get some sleep." Skin assessment completed with Ric, MHT, no abnormalities found. No contraband found on patient. Patient didn't have any medications this evening. Patient oriented to the unit and his room, given food tray and went to sleep. Patient given education, support and encouragement to be active in his treatment plan. Patient being monitored Q 15 minutes for safety per unit protocol. Patient remains safe on the unit.
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
## 2019-06-29 MED ORDER — ARIPIPRAZOLE 15 MG PO TABS: 15.0000 mg | ORAL_TABLET | Freq: Every day | ORAL | 1 refills | Status: DC

## 2019-06-29 MED ORDER — DIVALPROEX SODIUM 500 MG PO DR TAB: 500.0000 mg | DELAYED_RELEASE_TABLET | ORAL | 1 refills | Status: DC

## 2019-06-29 NOTE — Progress Notes (Signed)
D:Patient denies SI/HI/AVH, able to contract for safety at this time. Pt appears calm and cooperative, and no distress noted.   A: All Personal items in locker returned to pt. Upon discharge.   R:  Pt States he will comply with discharge planning put into place and take MEDS as prescribed. Pt escorted out of the building by this Clinical research associate to secure transport services.

## 2019-06-29 NOTE — Progress Notes (Signed)
Patient pleasant and coopertive this shift. Forwards minimal. Noted out of room using phone. Minimal communication. Isolative to self and room.  Denies any SI, HI, AVH. Pt remains safe on unit with q 15 min checks.
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
## 2019-06-29 NOTE — BHH Suicide Risk Assessment (Signed)
[facility] Discharge Suicide Risk Assessment   Principal Problem: Schizoaffective disorder [facility]) Discharge Diagnoses: Principal Problem:   Schizoaffective disorder (HCC) Active Problems:   Cocaine abuse with cocaine-induced mood disorder (HCC)   Total Time spent with patient: 30 minutes  Musculoskeletal: Strength & Muscle Tone: within normal limits Gait & Station: normal Patient leans: N/A  Psychiatric Specialty Exam: Review of Systems  Constitutional: Negative.   HENT: Negative.   Eyes: Negative.   Respiratory: Negative.   Cardiovascular: Negative.   Gastrointestinal: Negative.   Musculoskeletal: Negative.   Skin: Negative.   Neurological: Negative.   Psychiatric/Behavioral: Negative.     Blood pressure 113/72, pulse 60, temperature 97.9 F (36.6 C), temperature source Oral, resp. rate 17, height 5\' 10"  (1.778 m), weight 68 kg, SpO2 99 %.Body mass index is 21.51 kg/m.  General Appearance: Casual  Eye Contact::  Good  Speech:  Clear and Coherent409  Volume:  Decreased  Mood:  Euthymic  Affect:  Constricted  Thought Process:  Goal Directed  Orientation:  Full (Time, Place, and Person)  Thought Content:  Logical  Suicidal Thoughts:  No  Homicidal Thoughts:  No  Memory:  Immediate;   Fair Recent;   Fair Remote;   Fair  Judgement:  Fair  Insight:  Fair  Psychomotor Activity:  Normal  Concentration:  Fair  Recall:  [phone] of Knowledge:Fair  Language: Fair  Akathisia:  No  Handed:  Right  AIMS (if indicated):     Assets:  Desire for Improvement  Sleep:  Number of Hours: 7.75  Cognition: WNL  ADL's:  Intact   Mental Status Per Nursing Assessment::   On Admission:  Suicidal ideation indicated by patient  Demographic Factors:  Male and Low socioeconomic status  Loss Factors: NA  Historical Factors: Impulsivity  Risk Reduction Factors:   Living with another person, especially a relative and Positive social support  Continued Clinical Symptoms:   Alcohol/Substance Abuse/Dependencies Schizophrenia:   Paranoid or undifferentiated type  Cognitive Features That Contribute To Risk:  None    Suicide Risk:  Minimal: No identifiable suicidal ideation.  Patients presenting with no risk factors but with morbid ruminations; may be classified as minimal risk based on the severity of the depressive symptoms    Plan Of Care/Follow-up recommendations:  Activity:  Activity as tolerated Diet:  Regular diet Other:  Recommend strongly that he get follow-up outpatient mental health treatment and continue on medicine and stay off of cocaine  [phone], MD 06/29/2019, 10:51 AM

## 2019-06-29 NOTE — Plan of Care (Signed)
  Problem: Safety: Goal: Periods of time without injury will increase Outcome: Progressing   

## 2019-06-29 NOTE — Plan of Care (Signed)
D: Pt during assessments this morning is observed up walking in the hallway, just after finishing his breakfast. Pt. During our engagement was at first guarded and standoffish, but quickly able to loosen up, and interact with this writer for morning medicines. Pt. During our engagement was pleasant and cooperative. Pt. Endorsed doing, "I'm good". Pt. Denied physical pain and verbalized thus far tolerating his medicines. Pt. Denied si/hi/avh, and verbalized ability to continue to remains afe on the unit. Pt. Endorsed today feeling, "tired" as a complaint, but otherwise, no further complaints. Pt. Encouraged to rest if he is tired. Pt. Did not appear to be RTIS and affect was appropriate. Pt. Notably disheveled though and not attending to hygiene.   A: Q x 15 minute observation checks in place/maintained for safety. Patient is provided with education throughout shift when appropriate and able.  Patient is given/offered medications per orders. Patient is encouraged to attend groups, participate in unit activities and continue with plan of care. Pt. Chart and plans of care reviewed. Pt. Given support and encouragement when appropriate and able.    R: Patient is complaint with medication and unit procedures thus far. Pt. Observed eating good, up for breakfast. Pt. Besides breakfast and medicines has been isolative to room resting in bed. Pt. Thus far has not been a behavioral concern.     Problem: Education: Goal: Knowledge of [place] [facility] Education information/materials will improve Outcome: Progressing Goal: Emotional status will improve Outcome: Progressing Goal: Mental status will improve Outcome: Progressing Goal: Verbalization of understanding the information provided will improve Outcome: Progressing   Problem: Safety: Goal: Periods of time without injury will increase Outcome: Progressing   Problem: Education: Goal: Utilization of techniques to improve thought processes will  improve Outcome: Progressing Goal: Knowledge of the prescribed therapeutic regimen will improve Outcome: Progressing   Problem: Safety: Goal: Ability to disclose and discuss suicidal ideas will improve Outcome: Progressing Goal: Ability to identify and utilize support systems that promote safety will improve Outcome: Progressing

## 2019-06-29 NOTE — Discharge Summary (Signed)
Physician Discharge Summary Note  Patient:  [name] is an 29 y.o., male MRN:  [id number] DOB:  Jun 13, 1990 Patient phone:  [phone] (home)  Patient address:   [address] [place] [address],  Total Time spent with patient: 30 minutes  Date of Admission:  06/27/2019 Date of Discharge: 06/29/2019  Reason for Admission: Admitted after presenting to the emergency room being brought in agitated psychotic and having been running out into the street.  Initially talking about suicide.  Principal Problem: Schizoaffective disorder [facility]) Discharge Diagnoses: Principal Problem:   Schizoaffective disorder (HCC) Active Problems:   Cocaine abuse with cocaine-induced mood disorder (HCC)   Past Psychiatric History: History of both schizoaffective or schizophrenic disorder and cocaine abuse.  Presentations typically involve both which is complicated treatment and diagnosis of psychiatric disorder.  Previous episodes of self endangerment of correlated primarily with intoxication which is the case for this 1 as well  Past Medical History:  Past Medical History:  Diagnosis Date  . Schizo affective schizophrenia (HCC)   . Schizophrenia (HCC)    History reviewed. No pertinent surgical history. Family History: History reviewed. No pertinent family history. Family Psychiatric  History: None reported Social History:  Social History   Substance and Sexual Activity  Alcohol Use Not Currently     Social History   Substance and Sexual Activity  Drug Use Yes  . Types: Cocaine, Marijuana   Comment: crack 06-26-19    Social History   Socioeconomic History  . Marital status: Single    Spouse name: Not on file  . Number of children: Not on file  . Years of education: Not on file  . Highest education level: Not on file  Occupational History  . Not on file  Tobacco Use  . Smoking status: Current Every Day Smoker  . Smokeless tobacco: Never Used  Substance and Sexual Activity  .  Alcohol use: Not Currently  . Drug use: Yes    Types: Cocaine, Marijuana    Comment: crack 06-26-19  . Sexual activity: Not on file  Other Topics Concern  . Not on file  Social History Narrative  . Not on file   Social Determinants of Health   Financial Resource Strain:   . Difficulty of Paying Living Expenses:   Food Insecurity:   . Worried About Programme researcher, broadcasting/film/video in the Last Year:   . Barista in the Last Year:   Transportation Needs:   . Freight forwarder (Medical):   Marland Kitchen Lack of Transportation (Non-Medical):   Physical Activity:   . Days of Exercise per Week:   . Minutes of Exercise per Session:   Stress:   . Feeling of Stress :   Social Connections:   . Frequency of Communication with Friends and Family:   . Frequency of Social Gatherings with Friends and Family:   . Attends Religious Services:   . Active Member of Clubs or Organizations:   . Attends Banker Meetings:   Marland Kitchen Marital Status:     Hospital Course: Patient was put on 15-minute checks.  Restarted on Abilify and Depakote.  He showed no dangerous or aggressive behavior.  He slept quite a bit the first day but now is awake and alert.  He attended treatment team and was euthymic completely denying any suicidal thoughts and denying any psychotic symptoms.  Patient is requesting discharge home.  He has received counseling about the dangers of cocaine use and the importance of staying  on his medicine.  He acknowledges this and indicates that he will pursue outpatient treatment.  Prescriptions will be provided at discharge.  At this point does not appear to be acutely dangerous or likely to benefit from further forced inpatient treatment  Physical Findings: AIMS:  , ,  ,  ,    CIWA:    COWS:     Musculoskeletal: Strength & Muscle Tone: within normal limits Gait & Station: normal Patient leans: N/A  Psychiatric Specialty Exam: Physical Exam  Nursing note and vitals  reviewed. Constitutional: He appears well-developed.  HENT:  Head: Normocephalic and atraumatic.  Eyes: Pupils are equal, round, and reactive to light. Conjunctivae are normal.  Cardiovascular: Normal heart sounds.  Respiratory: Effort normal.  GI: Soft.  Musculoskeletal:        General: Normal range of motion.     Cervical back: Normal range of motion.  Neurological: He is alert.  Skin: Skin is warm and dry.  Psychiatric: Mood normal.    Review of Systems  Constitutional: Negative.   HENT: Negative.   Eyes: Negative.   Respiratory: Negative.   Cardiovascular: Negative.   Gastrointestinal: Negative.   Musculoskeletal: Negative.   Skin: Negative.   Neurological: Negative.   Psychiatric/Behavioral: Negative.     Blood pressure 113/72, pulse 60, temperature 97.9 F (36.6 C), temperature source Oral, resp. rate 17, height 5\' 10"  (1.778 m), weight 68 kg, SpO2 99 %.Body mass index is 21.51 kg/m.  General Appearance: Casual  Eye Contact:  Good  Speech:  Clear and Coherent  Volume:  Normal  Mood:  Euthymic  Affect:  Constricted  Thought Process:  Goal Directed  Orientation:  Full (Time, Place, and Person)  Thought Content:  Logical  Suicidal Thoughts:  No  Homicidal Thoughts:  No  Memory:  Immediate;   Fair Recent;   Fair Remote;   Fair  Judgement:  Fair  Insight:  Fair  Psychomotor Activity:  Normal  Concentration:  Concentration: Fair  Recall:  [place] of Knowledge:  Fair  Language:  Fair  Akathisia:  No  Handed:  Right  AIMS (if indicated):     Assets:  Communication Skills Physical Health Resilience Social Support  ADL's:  Intact  Cognition:  WNL  Sleep:  Number of Hours: 7.75     Have you used any form of tobacco in the last 30 days? (Cigarettes, Smokeless Tobacco, Cigars, and/or Pipes): Yes  Has this patient used any form of tobacco in the last 30 days? (Cigarettes, Smokeless Tobacco, Cigars, and/or Pipes) Yes, Yes, A prescription for an FDA-approved  tobacco cessation medication was offered at discharge and the patient refused  Blood Alcohol level:  Lab Results  Component Value Date   ETH <10 06/26/2019   ETH <10 31/54/0086    Metabolic Disorder Labs:  No results found for: HGBA1C, MPG No results found for: PROLACTIN Lab Results  Component Value Date   CHOL 121 11/30/2018   TRIG 102 11/30/2018   HDL 39 (L) 11/30/2018   CHOLHDL 3.1 11/30/2018   VLDL 20 11/30/2018   [place] 62 11/30/2018    See Psychiatric Specialty Exam and Suicide Risk Assessment completed by Attending Physician prior to discharge.  Discharge destination:  Home  Is patient on multiple antipsychotic therapies at discharge:  No   Has Patient had three or more failed trials of antipsychotic monotherapy by history:  No  Recommended Plan for Multiple Antipsychotic Therapies: NA  Discharge Instructions    Diet - low sodium  heart healthy   Complete by: As directed    Increase activity slowly   Complete by: As directed      Allergies as of 06/29/2019      Reactions   Ibuprofen Swelling   Tongue swelling   Risperidone And Related Other (See Comments), Swelling   gynecomastia gynecomastia   Ziprasidone Swelling   Tongue swells   Benztropine Other (See Comments)   Causes confusion, depression, and delusions   Quetiapine Other (See Comments)   Depression, suicidality, adverse effect: seizures       Medication List    STOP taking these medications   haloperidol 5 MG tablet Commonly known as: HALDOL     TAKE these medications     Indication  ARIPiprazole 15 MG tablet Commonly known as: ABILIFY Take 1 tablet (15 mg total) by mouth daily. Start taking on: June 30, 2019  Indication: Schizophrenia   divalproex 500 MG DR tablet Commonly known as: DEPAKOTE Take 1 tablet (500 mg total) by mouth every morning. Start taking on: June 30, 2019  Indication: Schizophrenia        Follow-up recommendations:  Activity:  Activity as tolerated Diet:   Regular diet Other:  Continue outpatient medicine and follow-up with outpatient psychiatric care  Comments: Patient is calm and appropriate and lucid denying any psychotic symptoms at this point and agrees to outpatient treatment.  Signed: Mordecai Rasmussen, MD 06/29/2019, 10:54 AM

## 2019-06-29 NOTE — [facility] Suicide Risk Assessment (Signed)
[facility] INPATIENT:  Family/Significant Other Suicide Prevention Education  Suicide Prevention Education:  Patient Refusal for Family/Significant Other Suicide Prevention Education: The patient [name] has refused to provide written consent for family/significant other to be provided Family/Significant Other Suicide Prevention Education during admission and/or prior to discharge.  Physician notified.  Seema Blum T Adrean Heitz 06/29/2019, 10:59 AM

## 2019-06-29 NOTE — Tx Team (Addendum)
Interdisciplinary Treatment and Diagnostic Plan Update  06/29/2019 Time of Session: 9am  ZOE NORDIN MRN: [id number]  Principal Diagnosis: Schizoaffective disorder [facility])  Secondary Diagnoses: Principal Problem:   Schizoaffective disorder ([place]) Active Problems:   Cocaine abuse with cocaine-induced mood disorder ([place])   Current Medications:  Current Facility-Administered Medications  Medication Dose Route Frequency Provider Last Rate Last Admin  . acetaminophen (TYLENOL) tablet 650 mg  650 mg Oral Q6H PRN Eulas Post, MD      . alum & mag hydroxide-simeth (MAALOX/MYLANTA) 200-200-20 MG/5ML suspension 30 mL  30 mL Oral Q4H PRN Eulas Post, MD      . ARIPiprazole (ABILIFY) tablet 15 mg  15 mg Oral Daily Clapacs, Madie Reno, MD   15 mg at 06/29/19 0758  . divalproex (DEPAKOTE) DR tablet 500 mg  500 mg Oral BH-q7a Clapacs, Madie Reno, MD   500 mg at 06/29/19 0758  . hydrOXYzine (ATARAX/VISTARIL) tablet 10 mg  10 mg Oral TID PRN Eulas Post, MD      . magnesium hydroxide (MILK OF MAGNESIA) suspension 30 mL  30 mL Oral Daily PRN Eulas Post, MD       PTA Medications: Medications Prior to Admission  Medication Sig Dispense Refill Last Dose  . haloperidol (HALDOL) 5 MG tablet Take 5 mg by mouth 2 (two) times daily.        Patient Stressors: Financial difficulties Medication change or noncompliance  Patient Strengths: Ability for insight Motivation for treatment/growth  Treatment Modalities: Medication Management, Group therapy, Case management,  1 to 1 session with clinician, Psychoeducation, Recreational therapy.   Physician Treatment Plan for Primary Diagnosis: Schizoaffective disorder ([place]) Long Term Goal(s): Improvement in symptoms so as ready for discharge Improvement in symptoms so as ready for discharge   Short Term Goals: Ability to verbalize feelings will improve Ability to disclose and discuss suicidal ideas Ability to demonstrate self-control will  improve Compliance with prescribed medications will improve  Medication Management: Evaluate patient's response, side effects, and tolerance of medication regimen.  Therapeutic Interventions: 1 to 1 sessions, Unit Group sessions and Medication administration.  Evaluation of Outcomes: Adequate for Discharge  Physician Treatment Plan for Secondary Diagnosis: Principal Problem:   Schizoaffective disorder ([place]) Active Problems:   Cocaine abuse with cocaine-induced mood disorder ([place])  Long Term Goal(s): Improvement in symptoms so as ready for discharge Improvement in symptoms so as ready for discharge   Short Term Goals: Ability to verbalize feelings will improve Ability to disclose and discuss suicidal ideas Ability to demonstrate self-control will improve Compliance with prescribed medications will improve     Medication Management: Evaluate patient's response, side effects, and tolerance of medication regimen.  Therapeutic Interventions: 1 to 1 sessions, Unit Group sessions and Medication administration.  Evaluation of Outcomes: Adequate for Discharge   RN Treatment Plan for Primary Diagnosis: Schizoaffective disorder ([place]) Long Term Goal(s): Knowledge of disease and therapeutic regimen to maintain health will improve  Short Term Goals: Ability to identify and develop effective coping behaviors will improve and Compliance with prescribed medications will improve  Medication Management: RN will administer medications as ordered by provider, will assess and evaluate patient's response and provide education to patient for prescribed medication. RN will report any adverse and/or side effects to prescribing provider.  Therapeutic Interventions: 1 on 1 counseling sessions, Psychoeducation, Medication administration, Evaluate responses to treatment, Monitor vital signs and CBGs as ordered, Perform/monitor CIWA, COWS, AIMS and Fall Risk screenings as ordered, Perform wound care treatments  as ordered.  Evaluation  of Outcomes: Adequate for Discharge   LCSW Treatment Plan for Primary Diagnosis: Schizoaffective disorder [facility]) Long Term Goal(s): Safe transition to appropriate next level of care at discharge, Engage patient in therapeutic group addressing interpersonal concerns.  Short Term Goals: Engage patient in aftercare planning with referrals and resources  Therapeutic Interventions: Assess for all discharge needs, 1 to 1 time with Social worker, Explore available resources and support systems, Assess for adequacy in community support network, Educate family and significant other(s) on suicide prevention, Complete Psychosocial Assessment, Interpersonal group therapy.  Evaluation of Outcomes: Adequate for Discharge   Progress in Treatment: Attending groups: No. Participating in groups: No. Taking medication as prescribed: Yes. Toleration medication: Yes. Family/Significant other contact made: Yes, individual(s) contacted:  pt declined Patient understands diagnosis: Yes. Discussing patient identified problems/goals with staff: Yes. Medical problems stabilized or resolved: Yes. Denies suicidal/homicidal ideation: Yes. Issues/concerns per patient self-inventory: No. Other: NA  New problem(s) identified: No, Describe:  none reported  New Short Term/Long Term Goal(s):Attend outpatient treatment, take medication as prescribed, develop and implement healthy coping methods  Patient Goals: "Get myself back together"   Discharge Plan or Barriers: Pt is scheduled for discharge today and reports he will be living with a friend. Pt request referral to [facility] for outpatient treatment.  Reason for Continuation of Hospitalization: Pt is scheduled for d/c 06/29/19  Estimated Length of Stay:NA  Recreational Therapy: Patient: N/A Patient Goal: Patient will engage in groups without prompting or encouragement from LRT x3 group sessions within 5 recreation therapy group  sessions.  Attendees: Patient:Kadarious Montemayor 06/29/2019 11:05 AM  Physician: Mordecai Rasmussen 06/29/2019 11:05 AM  Nursing: Lorenda Hatchet, nurse 06/29/2019 11:05 AM  RN Care Manager: 06/29/2019 11:05 AM  Social Worker: Ferd Hibbs 06/29/2019 11:05 AM  Recreational Therapist: Danella Deis Khelani Kops 06/29/2019 11:05 AM  Other:  06/29/2019 11:05 AM  Other:  06/29/2019 11:05 AM  Other: 06/29/2019 11:05 AM    Scribe for Treatment Team: Suzan Slick, LCSW 06/29/2019 11:05 AM

## 2019-06-29 NOTE — Progress Notes (Signed)
  [facility] Adult Case Management Discharge Plan :  Will you be returning to the same living situation after discharge:  Yes,  pt says he lives with a friend At discharge, do you have transportation home?: Yes,  safe transport Do you have the ability to pay for your medications: Yes,  Medicare, medicaid  Release of information consent forms completed and in the chart;  Patient's signature needed at discharge.  Patient to Follow up at:  Follow-up Information    [facility] Go on 07/03/2019.   Why: You are scheduled for Monday, June 21st at 230pm. Please bring hospital discharge paperwork, photo ID, insurance card. Thank you. Contact information: [address] [place] [address] [phone]               Next level of care provider has access to [facility] Link:no  Safety Planning and Suicide Prevention discussed: Yes,  with pt  Have you used any form of tobacco in the last 30 days? (Cigarettes, Smokeless Tobacco, Cigars, and/or Pipes): Yes  Has patient been referred to the Quitline?: Patient refused referral  Patient has been referred for addiction treatment: N/A  Suzan Slick, LCSW 06/29/2019, 11:24 AM

## 2019-06-29 NOTE — Plan of Care (Signed)
Care planning closed out per MD orders appropriate for discharge.     Problem: Education: Goal: Knowledge of [place] [facility] Education information/materials will improve 06/29/2019 1109 by Reyes Ivan, RN Outcome: Completed/Met 06/29/2019 0859 by Reyes Ivan, RN Outcome: Progressing Goal: Emotional status will improve 06/29/2019 1109 by Reyes Ivan, RN Outcome: Completed/Met 06/29/2019 0859 by Reyes Ivan, RN Outcome: Progressing Goal: Mental status will improve 06/29/2019 1109 by Reyes Ivan, RN Outcome: Completed/Met 06/29/2019 0859 by Reyes Ivan, RN Outcome: Progressing Goal: Verbalization of understanding the information provided will improve 06/29/2019 1109 by Reyes Ivan, RN Outcome: Completed/Met 06/29/2019 0859 by Reyes Ivan, RN Outcome: Progressing   Problem: Safety: Goal: Periods of time without injury will increase 06/29/2019 1109 by Reyes Ivan, RN Outcome: Completed/Met 06/29/2019 0859 by Reyes Ivan, RN Outcome: Progressing   Problem: Education: Goal: Utilization of techniques to improve thought processes will improve 06/29/2019 1109 by Reyes Ivan, RN Outcome: Completed/Met 06/29/2019 0859 by Reyes Ivan, RN Outcome: Progressing Goal: Knowledge of the prescribed therapeutic regimen will improve 06/29/2019 1109 by Reyes Ivan, RN Outcome: Completed/Met 06/29/2019 0859 by Reyes Ivan, RN Outcome: Progressing   Problem: Safety: Goal: Ability to disclose and discuss suicidal ideas will improve 06/29/2019 1109 by Reyes Ivan, RN Outcome: Completed/Met 06/29/2019 0859 by Reyes Ivan, RN Outcome: Progressing Goal: Ability to identify and utilize support systems that promote safety will improve 06/29/2019 1109 by Reyes Ivan, RN Outcome: Completed/Met 06/29/2019 0859 by Reyes Ivan, RN Outcome: Progressing

## 2019-06-29 NOTE — Progress Notes (Signed)
Recreation Therapy Notes  Date: 06/29/2019  Time: 9:30 am   Location: Room 21   Behavioral response: N/A   Intervention Topic: Animal Assisted therapy    Discussion/Intervention: Patient did not attend group.   Clinical Observations/Feedback:  Patient did not attend group.   Ellaree Gear LRT/CTRS        Nessie Nong 06/29/2019 10:47 AM

## 2019-07-02 ENCOUNTER — Emergency Department
Admission: EM | Admit: 2019-07-02 | Discharge: 2019-07-08 | Disposition: A | Discharge status: Home / Self Care | Payer: Medicare Other | Attending: Emergency Medicine | Admitting: Emergency Medicine

## 2019-07-02 ENCOUNTER — Emergency Department: Payer: Medicare Other

## 2019-07-02 DIAGNOSIS — Y999 Unspecified external cause status: Secondary | ICD-10-CM | POA: Insufficient documentation

## 2019-07-02 DIAGNOSIS — Y929 Unspecified place or not applicable: Secondary | ICD-10-CM | POA: Diagnosis not present

## 2019-07-02 DIAGNOSIS — F1411 Cocaine abuse, in remission: Secondary | ICD-10-CM | POA: Diagnosis not present

## 2019-07-02 DIAGNOSIS — Y939 Activity, unspecified: Secondary | ICD-10-CM | POA: Diagnosis not present

## 2019-07-02 DIAGNOSIS — Z20822 Contact with and (suspected) exposure to covid-19: Secondary | ICD-10-CM | POA: Diagnosis not present

## 2019-07-02 DIAGNOSIS — F259 Schizoaffective disorder, unspecified: Secondary | ICD-10-CM | POA: Diagnosis not present

## 2019-07-02 DIAGNOSIS — X789XXA Intentional self-harm by unspecified sharp object, initial encounter: Secondary | ICD-10-CM | POA: Diagnosis not present

## 2019-07-02 DIAGNOSIS — R451 Restlessness and agitation: Secondary | ICD-10-CM | POA: Diagnosis present

## 2019-07-02 DIAGNOSIS — F209 Schizophrenia, unspecified: Secondary | ICD-10-CM

## 2019-07-02 DIAGNOSIS — Z653 Problems related to other legal circumstances: Secondary | ICD-10-CM | POA: Diagnosis not present

## 2019-07-02 DIAGNOSIS — S0181XA Laceration without foreign body of other part of head, initial encounter: Secondary | ICD-10-CM | POA: Insufficient documentation

## 2019-07-02 LAB — CBC WITH DIFFERENTIAL/PLATELET
Abs Immature Granulocytes: 0 10*3/uL (ref 0.00–0.07)
Basophils Absolute: 0 10*3/uL (ref 0.0–0.1)
Basophils Relative: 1 %
Eosinophils Absolute: 0.1 10*3/uL (ref 0.0–0.5)
Eosinophils Relative: 1 %
HCT: 41.7 % (ref 39.0–52.0)
Hemoglobin: 14.2 g/dL (ref 13.0–17.0)
Immature Granulocytes: 0 %
Lymphocytes Relative: 42 %
Lymphs Abs: 1.8 10*3/uL (ref 0.7–4.0)
MCH: 29.3 pg (ref 26.0–34.0)
MCHC: 34.1 g/dL (ref 30.0–36.0)
MCV: 86 fL (ref 80.0–100.0)
Monocytes Absolute: 0.5 10*3/uL (ref 0.1–1.0)
Monocytes Relative: 12 %
Neutro Abs: 1.9 10*3/uL (ref 1.7–7.7)
Neutrophils Relative %: 44 %
Platelets: 226 10*3/uL (ref 150–400)
RBC: 4.85 MIL/uL (ref 4.22–5.81)
RDW: 12.6 % (ref 11.5–15.5)
WBC: 4.3 10*3/uL (ref 4.0–10.5)
nRBC: 0 % (ref 0.0–0.2)

## 2019-07-02 LAB — COMPREHENSIVE METABOLIC PANEL
ALT: 19 U/L (ref 0–44)
AST: 27 U/L (ref 15–41)
Albumin: 4.3 g/dL (ref 3.5–5.0)
Alkaline Phosphatase: 61 U/L (ref 38–126)
Anion gap: 9 (ref 5–15)
BUN: 20 mg/dL (ref 6–20)
CO2: 27 mmol/L (ref 22–32)
Calcium: 9.5 mg/dL (ref 8.9–10.3)
Chloride: 102 mmol/L (ref 98–111)
Creatinine, Ser: 1.07 mg/dL (ref 0.61–1.24)
GFR calc Af Amer: >60 mL/min (ref >60)
GFR calc non Af Amer: >60 mL/min (ref >60)
Glucose, Bld: 79 mg/dL (ref 70–99)
Potassium: 3.5 mmol/L (ref 3.5–5.1)
Sodium: 138 mmol/L (ref 135–145)
Total Bilirubin: 0.7 mg/dL (ref 0.3–1.2)
Total Protein: 7.5 g/dL (ref 6.5–8.1)

## 2019-07-02 LAB — ETHANOL: Alcohol, Ethyl (B): <10 mg/dL (ref <10)
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
## 2019-07-02 NOTE — Progress Notes (Signed)
Patient ID: [name], male   DOB: 21-Jan-1990, 29 y.o.   MRN: [id number]   Rama Candise Bowens MD Psychiatry   Attempted to see patient but he is too sedated at this time  Received Versed and Haldol via EMS  TTS attempted as well   He was just discharged two days ago from our Inpatient psych   Still with psychotic and OOC and agitated behaviors on IVC

## 2019-07-02 NOTE — ED Notes (Signed)
Pt transported to CT ?
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
## 2019-07-02 NOTE — BH Assessment (Signed)
TTS unable to assess, as patient is sleeping and cannot be aroused enough to participate with the assessment.  

## 2019-07-02 NOTE — ED Notes (Signed)
Transported to CT with security.

## 2019-07-03 DIAGNOSIS — F259 Schizoaffective disorder, unspecified: Secondary | ICD-10-CM | POA: Diagnosis not present

## 2019-07-03 LAB — URINE DRUG SCREEN, QUALITATIVE (ARMC ONLY)
Amphetamines, Ur Screen: NOT DETECTED
Barbiturates, Ur Screen: NOT DETECTED
Benzodiazepine, Ur Scrn: POSITIVE — AB
Cannabinoid 50 Ng, Ur ~~LOC~~: POSITIVE — AB
Cocaine Metabolite,Ur ~~LOC~~: POSITIVE — AB
MDMA (Ecstasy)Ur Screen: NOT DETECTED
Methadone Scn, Ur: NOT DETECTED
Opiate, Ur Screen: NOT DETECTED
Phencyclidine (PCP) Ur S: NOT DETECTED
Tricyclic, Ur Screen: NOT DETECTED

## 2019-07-03 LAB — SARS CORONAVIRUS 2 BY RT PCR (HOSPITAL ORDER, PERFORMED IN ~~LOC~~ HOSPITAL LAB): SARS Coronavirus 2: NEGATIVE

## 2019-07-03 MED ORDER — HALOPERIDOL 0.5 MG PO TABS
3.0000 mg | ORAL_TABLET | Freq: Every day | ORAL | Status: DC
  Administered 2019-07-03: 3 mg via ORAL
  Filled 2019-07-03 (×2): qty 1

## 2019-07-03 MED ORDER — DIVALPROEX SODIUM ER 500 MG PO TB24
500.0000 mg | ORAL_TABLET | Freq: Every day | ORAL | Status: DC
  Administered 2019-07-03 – 2019-07-07 (×5): 500 mg via ORAL
  Filled 2019-07-03 (×2): qty 1
  Filled 2019-07-03: qty 2
  Filled 2019-07-03 (×2): qty 1

## 2019-07-03 MED ORDER — OLANZAPINE 10 MG PO TBDP
10.0000 mg | ORAL_TABLET | Freq: Every day | ORAL | Status: DC
  Administered 2019-07-03 – 2019-07-07 (×5): 10 mg via ORAL
  Filled 2019-07-03 (×2): qty 1
  Filled 2019-07-03: qty 2
  Filled 2019-07-03 (×3): qty 1

## 2019-07-03 MED ORDER — TRIHEXYPHENIDYL HCL 2 MG PO TABS
2.0000 mg | ORAL_TABLET | Freq: Two times a day (BID) | ORAL | Status: DC
  Administered 2019-07-03 – 2019-07-07 (×8): 2 mg via ORAL
  Filled 2019-07-03 (×13): qty 1

## 2019-07-03 MED ORDER — DULOXETINE HCL 30 MG PO CPEP
30.0000 mg | ORAL_CAPSULE | Freq: Every day | ORAL | Status: DC
  Administered 2019-07-03 – 2019-07-07 (×5): 30 mg via ORAL
  Filled 2019-07-03 (×6): qty 1

## 2019-07-03 NOTE — ED Notes (Signed)
IVC,pending reassessment when alert

## 2019-07-03 NOTE — ED Notes (Signed)
Patient out of bed to shower, changed into burgundy clothes. Awaiting admission status.

## 2019-07-03 NOTE — ED Notes (Signed)
Psych and TTS at bedside to consult. Awaiting plan of care.

## 2019-07-03 NOTE — ED Provider Notes (Signed)
Emergency Medicine Observation Re-evaluation Note  [name] is a 29 y.o. male, seen on rounds today.  Pt initially presented to the ED for complaints of Aggressive Behavior Currently, the patient is asleep. Due to aggression earlier requiring haldol/ativan I attempted to wake pt up verbally but was resting comfortable at this time.   Physical Exam  BP 102/70 (BP Location: Left Arm)   Pulse 95   Temp 98.3 F (36.8 C) (Axillary)   Resp 16   Ht 5\' 10"  (1.778 m)   Wt 68 kg   SpO2 96%   BMI 21.52 kg/m  Physical Exam Physical Exam General: No apparent distress, asleep in bed.  HEENT: moist mucous membranes CV: RRR Pulm: Normal WOB GI: soft and non tender MSK: no edema or cyanosis Neuro: face symmetric, sleeping     ED Course / MDM  EKG:    I have reviewed the labs performed to date as well as medications administered while in observation.  Recent changes in the last 24 hours include no new medications but did get meds with police yesterday when he first arrived. CT head yesterday negative.  Plan  Current plan is for psych/TTS to evaluate when more alert. CT head yesterdaty negative so more likely from sedating medications. Pt was recently dc from psych facility a few days ago so needs evaluation and clearance before dc back to police.   Patient is under full IVC at this time.   , MD 07/03/19 [phone]

## 2019-07-03 NOTE — ED Notes (Signed)
TTS unable to assess patient. He is unable to be aroused enough to participate in an assessment.
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
## 2019-07-03 NOTE — ED Notes (Signed)
Parent called and was very angry on phone with Clinical research associate, requesting update. Parents was made aware that patient is in ed and is well at present time. Awaiting plan of care. Parent requesting information about patient being incarcerated. Parent requesting that phych call her and update her. As per patient does not live with mother, lives with his friends does not want info.

## 2019-07-03 NOTE — ED Notes (Signed)
Report to include Situation, Background, Assessment, and Recommendations received from RN. Patient alert and oriented, warm and dry, in no acute distress. Patient denies SI, HI, AVH and pain. Patient made aware of Q15 minute rounds and Rover and Officer presence for their safety. Patient instructed to come to me with needs or concerns.   

## 2019-07-03 NOTE — ED Notes (Signed)
Hourly rounding reveals patient in room. No complaints, stable, in no acute distress. Q15 minute rounds and monitoring via Rover and Officer to continue.   

## 2019-07-03 NOTE — ED Notes (Signed)
Assumed care of patient. Patient awaiting re-eval and plan of care. Patient slept through the night. Police officer at bedside awaiting psych re-eval, patient incarcerated and pending jail.
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
## 2019-07-04 DIAGNOSIS — F259 Schizoaffective disorder, unspecified: Secondary | ICD-10-CM | POA: Diagnosis not present

## 2019-07-04 MED ORDER — HALOPERIDOL 2 MG PO TABS
4.0000 mg | ORAL_TABLET | Freq: Every day | ORAL | Status: DC
  Administered 2019-07-04 – 2019-07-07 (×4): 4 mg via ORAL
  Filled 2019-07-04 (×2): qty 2
  Filled 2019-07-04: qty 8
  Filled 2019-07-04: qty 2
  Filled 2019-07-04: qty 8

## 2019-07-04 NOTE — ED Notes (Signed)
Hourly rounding reveals patient in room. No complaints, stable, in no acute distress. Q15 minute rounds and monitoring via Rover and Officer to continue.   

## 2019-07-04 NOTE — ED Notes (Signed)
Snack tray and drink given. 

## 2019-07-04 NOTE — ED Notes (Signed)

## 2019-07-04 NOTE — Progress Notes (Signed)
[facility] MD Progress Note  07/04/2019 2:33 PM [name]  MRN:  [id number] Subjective:   I like my new meds  Principal Problem: <principal problem not specified> psychosis and mood problems on IVC and has jail time pending after being discharged recently from our unit  Diagnosis: Active Problems:   * No active hospital problems. *  As above --  Schizoaffective disorder     Total Time spent with patient:  25 min  Past Psychiatric History: ongoing S/A problems   Past Medical History:  Past Medical History:  Diagnosis Date  . Schizo affective schizophrenia ([facility])   . Schizophrenia ([facility])    No past surgical history on file. Family History: No family history on file. Family Psychiatric  History: already discussed Social History:  Social History   Substance and Sexual Activity  Alcohol Use Not Currently     Social History   Substance and Sexual Activity  Drug Use Yes  . Types: Cocaine, Marijuana   Comment: crack 06-26-19    Social History   Socioeconomic History  . Marital status: Single    Spouse name: Not on file  . Number of children: Not on file  . Years of education: Not on file  . Highest education level: Not on file  Occupational History  . Not on file  Tobacco Use  . Smoking status: Current Every Day Smoker  . Smokeless tobacco: Never Used  Substance and Sexual Activity  . Alcohol use: Not Currently  . Drug use: Yes    Types: Cocaine, Marijuana    Comment: crack 06-26-19  . Sexual activity: Not on file  Other Topics Concern  . Not on file  Social History Narrative  . Not on file   Social Determinants of Health   Financial Resource Strain:   . Difficulty of Paying Living Expenses:   Food Insecurity:   . Worried About Programme researcher, broadcasting/film/video in the Last Year:   . Barista in the Last Year:   Transportation Needs:   . Freight forwarder (Medical):   Marland Kitchen Lack of Transportation (Non-Medical):   Physical Activity:   . Days of Exercise per  Week:   . Minutes of Exercise per Session:   Stress:   . Feeling of Stress :   Social Connections:   . Frequency of Communication with Friends and Family:   . Frequency of Social Gatherings with Friends and Family:   . Attends Religious Services:   . Active Member of Clubs or Organizations:   . Attends Banker Meetings:   Marland Kitchen Marital Status:    Additional Social History:    Pain Medications: see mar Prescriptions: see mar Over the Counter: see mar History of alcohol / drug use?: Yes Name of Substance 1: Cocaine 1 - Last Use / Amount: Yesterday                  Sleep: improving   Appetite:  Improving   Current Medications: Current Facility-Administered Medications  Medication Dose Route Frequency Provider Last Rate Last Admin  . divalproex (DEPAKOTE ER) 24 hr tablet 500 mg  500 mg Oral QHS Roselind Messier, MD   500 mg at 07/03/19 2148  . DULoxetine (CYMBALTA) DR capsule 30 mg  30 mg Oral Daily Roselind Messier, MD   30 mg at 07/04/19 1133  . haloperidol (HALDOL) tablet 4 mg  4 mg Oral QHS Roselind Messier, MD      . OLANZapine zydis [facility]) disintegrating  tablet 10 mg  10 mg Oral QHS Roselind Messier, MD   10 mg at 07/03/19 2148  . trihexyphenidyl (ARTANE) tablet 2 mg  2 mg Oral BID WC Roselind Messier, MD   2 mg at 07/04/19 1133   Current Outpatient Medications  Medication Sig Dispense Refill  . ARIPiprazole (ABILIFY) 15 MG tablet Take 1 tablet (15 mg total) by mouth daily. 30 tablet 1  . divalproex (DEPAKOTE) 500 MG DR tablet Take 1 tablet (500 mg total) by mouth every morning. 30 tablet 1    Lab Results:  Results for orders placed or performed during the hospital encounter of 07/02/19 (from the past 48 hour(s))  SARS Coronavirus 2 by RT PCR (hospital order, performed in [facility] hospital lab) Nasopharyngeal Nasopharyngeal Swab     Status: None   Collection Time: 07/02/19  2:52 PM   Specimen: Nasopharyngeal Swab  Result Value Ref Range   SARS  Coronavirus 2 NEGATIVE NEGATIVE    Comment: (NOTE) SARS-CoV-2 target nucleic acids are NOT DETECTED.  The SARS-CoV-2 RNA is generally detectable in upper and lower respiratory specimens during the acute phase of infection. The lowest concentration of SARS-CoV-2 viral copies this assay can detect is 250 copies / mL. A negative result does not preclude SARS-CoV-2 infection and should not be used as the sole basis for treatment or other patient management decisions.  A negative result may occur with improper specimen collection / handling, submission of specimen other than nasopharyngeal swab, presence of viral mutation(s) within the areas targeted by this assay, and inadequate number of viral copies (<250 copies / mL). A negative result must be combined with clinical observations, patient history, and epidemiological information.  Fact Sheet for Patients:   BoilerBrush.com.cy  Fact Sheet for Healthcare Providers: https://pope.com/  This test is not yet approved or  cleared by the [place] FDA and has been authorized for detection and/or diagnosis of SARS-CoV-2 by FDA under an Emergency Use Authorization (EUA).  This EUA will remain in effect (meaning this test can be used) for the duration of the COVID-19 declaration under Section 564(b)(1) of the Act, 21 U.S.C. section 360bbb-3(b)(1), unless the authorization is terminated or revoked sooner.  Performed at [facility], [address] [place], [place] [address]   CBC with Differential/Platelet     Status: None   Collection Time: 07/02/19  2:59 PM  Result Value Ref Range   WBC 4.3 4.0 - 10.5 K/uL   RBC 4.85 4.22 - 5.81 MIL/uL   Hemoglobin 14.2 13.0 - 17.0 g/dL   HCT 79.8 39 - 52 %   MCV 86.0 80.0 - 100.0 fL   MCH 29.3 26.0 - 34.0 pg   MCHC 34.1 30.0 - 36.0 g/dL   RDW 92.1 19.4 - 17.4 %   Platelets 226 150 - 400 K/uL   nRBC 0.0 0.0 - 0.2 %   Neutrophils Relative  % 44 %   Neutro Abs 1.9 1.7 - 7.7 K/uL   Lymphocytes Relative 42 %   Lymphs Abs 1.8 0.7 - 4.0 K/uL   Monocytes Relative 12 %   Monocytes Absolute 0.5 0 - 1 K/uL   Eosinophils Relative 1 %   Eosinophils Absolute 0.1 0 - 0 K/uL   Basophils Relative 1 %   Basophils Absolute 0.0 0 - 0 K/uL   Immature Granulocytes 0 %   Abs Immature Granulocytes 0.00 0.00 - 0.07 K/uL    Comment: Performed at [facility], [address], [place], [place]  [address]  Comprehensive metabolic panel     Status: None   Collection Time: 07/02/19  2:59 PM  Result Value Ref Range   Sodium 138 135 - 145 mmol/L   Potassium 3.5 3.5 - 5.1 mmol/L   Chloride 102 98 - 111 mmol/L   CO2 27 22 - 32 mmol/L   Glucose, Bld 79 70 - 99 mg/dL    Comment: Glucose reference range applies only to samples taken after fasting for at least 8 hours.   BUN 20 6 - 20 mg/dL   Creatinine, Ser 1.07 0.61 - 1.24 mg/dL   Calcium 9.5 8.9 - 10.3 mg/dL   Total Protein 7.5 6.5 - 8.1 g/dL   Albumin 4.3 3.5 - 5.0 g/dL   AST 27 15 - 41 U/L   ALT 19 0 - 44 U/L   Alkaline Phosphatase 61 38 - 126 U/L   Total Bilirubin 0.7 0.3 - 1.2 mg/dL   GFR calc non Af Amer >60 >60 mL/min   GFR calc Af Amer >60 >60 mL/min   Anion gap 9 5 - 15    Comment: Performed at [facility], [address], [place], [place] [address]  Ethanol     Status: None   Collection Time: 07/02/19  2:59 PM  Result Value Ref Range   Alcohol, Ethyl (B) <10 <10 mg/dL    Comment: (NOTE) Lowest detectable limit for serum alcohol is 10 mg/dL.  For medical purposes only. Performed at [facility], [address] [place], [place] [address]   Urine Drug Screen, Qualitative [facility] only)     Status: Abnormal   Collection Time: 07/03/19  9:51 PM  Result Value Ref Range   Tricyclic, Ur Screen NONE DETECTED NONE DETECTED   Amphetamines, Ur Screen NONE DETECTED NONE DETECTED   MDMA (Ecstasy)Ur Screen NONE DETECTED NONE DETECTED   Cocaine Metabolite,Ur [place]  POSITIVE (A) NONE DETECTED   Opiate, Ur Screen NONE DETECTED NONE DETECTED   Phencyclidine (PCP) Ur S NONE DETECTED NONE DETECTED   Cannabinoid 50 Ng, Ur [place] POSITIVE (A) NONE DETECTED   Barbiturates, Ur Screen NONE DETECTED NONE DETECTED   Benzodiazepine, Ur Scrn POSITIVE (A) NONE DETECTED   Methadone Scn, Ur NONE DETECTED NONE DETECTED    Comment: (NOTE) Tricyclics + metabolites, urine    Cutoff 1000 ng/mL Amphetamines + metabolites, urine  Cutoff 1000 ng/mL MDMA (Ecstasy), urine              Cutoff 500 ng/mL Cocaine Metabolite, urine          Cutoff 300 ng/mL Opiate + metabolites, urine        Cutoff 300 ng/mL Phencyclidine (PCP), urine         Cutoff 25 ng/mL Cannabinoid, urine                 Cutoff 50 ng/mL Barbiturates + metabolites, urine  Cutoff 200 ng/mL Benzodiazepine, urine              Cutoff 200 ng/mL Methadone, urine                   Cutoff 300 ng/mL  The urine drug screen provides only a preliminary, unconfirmed analytical test result and should not be used for non-medical purposes. Clinical consideration and professional judgment should be applied to any positive drug screen result due to possible interfering substances. A more specific alternate chemical method must be used in order to obtain a confirmed analytical result. Gas chromatography / mass  spectrometry (GC/MS) is the preferred confirm atory method. Performed at [facility], [address] [place], [place] [address]     Blood Alcohol level:  Lab Results  Component Value Date   [facility] <10 07/02/2019   ETH <10 06/26/2019    Metabolic Disorder Labs: No results found for: HGBA1C, MPG No results found for: PROLACTIN Lab Results  Component Value Date   CHOL 121 11/30/2018   TRIG 102 11/30/2018   HDL 39 (L) 11/30/2018   CHOLHDL 3.1 11/30/2018   VLDL 20 11/30/2018   LDLCALC 62 11/30/2018    Physical Findings: AIMS:  , ,  ,  ,  negative   CIWA:    COWS:     Musculoskeletal: Strength  & Muscle Tone:  Same no change  Gait & Station: normal  Patient leans:   --n/a  Psychiatric Specialty Exam: Physical Exam  Review of Systems  Blood pressure 109/67, pulse (!) 49, temperature 98 F (36.7 C), temperature source Oral, resp. rate 18, height 5\' 10"  (1.778 m), weight 68 kg, SpO2 99 %.Body mass index is 21.52 kg/m.    Mental  Status   Alert cooperative  Oriented to person place and time  Consciousness Not clouded or fluctuant Mood somewhat better less anxious Concentration and attention better Better hygeine after shower and all No movement problems for now No active SI Hi or plans Continues psychosis and ups and downs --new meds awaiting effects  He says theyare better than ability Judgement insight fund of knowledge intelligence reliability --all fair to poor Memory about the same no new change Abstraction somewhat concrete          Treatment Plan Summary:   Awaits new bed placement and then has to serve [place] time via courts for charges pending  Continues on same meds with hope it makes him more stable  Could benefit from [place] Haldol   [place], MD 07/04/2019, 2:33 PM

## 2019-07-04 NOTE — ED Notes (Signed)
IVC/  PENDING  PLACEMENT 

## 2019-07-04 NOTE — ED Notes (Addendum)
Pt. Noted in room. No complaints or concerns voiced. No distress or abnormal behavior noted. Will continue to monitor with security cameras. Q 15 minute rounds continue. Pt. Denies SI, HI, and AVH. Pt. Encouraged to let nursing staff know of any concerns or needs.

## 2019-07-04 NOTE — ED Provider Notes (Signed)
Emergency Medicine Observation Re-evaluation Note  [name] is a 29 y.o. male, seen on rounds today.  Pt initially presented to the ED for complaints of Aggressive Behavior Currently, the patient has no complaints this morning.  Physical Exam  BP 111/71 (BP Location: Right Arm)    Pulse (!) 52    Temp 98.3 F (36.8 C) (Oral)    Resp 18    Ht 5\' 10"  (1.778 m)    Wt 68 kg    SpO2 100%    BMI 21.52 kg/m  Physical Exam   General: No apparent distress HEENT: moist mucous membranes CV: RRR Pulm: Normal WOB GI: soft and non tender MSK: no edema or cyanosis Neuro: face symmetric, moving all extremities    ED Course / MDM  EKG:    I have reviewed the labs performed to date as well as medications administered while in observation.  No acute events overnight Plan  Current plan is for psychiatric placement. Patient is under full IVC at this time.   , Don Perking, MD 07/04/19 [phone]

## 2019-07-04 NOTE — BH Assessment (Addendum)
Referral check  [facility]- [phone] No answer   [facility]- [phone] Michael reports plans to call back   [facility]-[phone] Chandreika reports denied due to acuity & aggression hx   

## 2019-07-04 NOTE — ED Notes (Signed)

## 2019-07-04 NOTE — ED Notes (Signed)
Pt is transferred to [facility]

## 2019-07-04 NOTE — ED Notes (Signed)
Writer faxed out patient referral to facilities below. This Clinical research associate followed up on facilities below regarding patients referral.    [facility]- [phone] (Call back on day shift) [facility]- [phone] (Call back on day shift) [facility]-[phone] (Call back on day shift)

## 2019-07-05 DIAGNOSIS — F259 Schizoaffective disorder, unspecified: Secondary | ICD-10-CM | POA: Diagnosis not present

## 2019-07-05 NOTE — ED Notes (Signed)
Pt given phone to call his mother per her request.

## 2019-07-05 NOTE — ED Notes (Signed)

## 2019-07-05 NOTE — ED Notes (Signed)
Pt denies SI/HI/AVH on assessment 

## 2019-07-06 DIAGNOSIS — F259 Schizoaffective disorder, unspecified: Secondary | ICD-10-CM | POA: Diagnosis not present

## 2019-07-06 NOTE — ED Notes (Signed)
IVC/continues to await placement 

## 2019-07-06 NOTE — BH Assessment (Signed)
Follow Up with the following facilities:  [facility]- ([phone])No answer  [facility] ([phone]) No answer  [facility] ([phone] -or- [phone]),Chandreika reports denied due to acuity & aggression hx  

## 2019-07-06 NOTE — ED Notes (Signed)
Gave patient night snack [place] tray and drink.AS

## 2019-07-06 NOTE — ED Provider Notes (Signed)
Emergency Medicine Observation Re-evaluation Note  [name] is a 29 y.o. male, seen on rounds today.  Pt initially presented to the ED for complaints of Aggressive Behavior  Currently, the patient is resting comfortably.  Asking for something to drink and to shower.  No acute complaints.  Physical Exam  BP 124/60 (BP Location: Left Arm)   Pulse 61   Temp 98.3 F (36.8 C) (Oral)   Resp 18   Ht 5\' 10"  (1.778 m)   Wt 68 kg   SpO2 99%   BMI 21.52 kg/m  Physical Exam  General: Awake and alert Head: [place]/AT Respiratory: Normal work of breathing Cardiac: Extremities well perfused MSK: No deformities Neurological: No focal neurological deficits Psych: Calm and cooperative   ED Course / MDM  I have reviewed the labs performed to date as well as medications administered while in observation.  No new labs in the last 24 hours.  Receiving his scheduled medications.  Plan  Awaiting placement. Patient is under full IVC at this time.   ., MD 07/06/19 1004

## 2019-07-06 NOTE — ED Notes (Signed)
Pt denies SI/HI/AVH on assessment. Uninterested during interactions but will come to request snacks repeatedly needing redirection for this.

## 2019-07-06 NOTE — BH Assessment (Signed)
Follow Up with the following facilities:  [facility]-[phone]) No answer   [facility] [phone]) No answer

## 2019-07-07 DIAGNOSIS — F259 Schizoaffective disorder, unspecified: Secondary | ICD-10-CM | POA: Diagnosis not present

## 2019-07-07 MED ORDER — TRIHEXYPHENIDYL HCL 2 MG PO TABS
2.0000 mg | ORAL_TABLET | Freq: Once | ORAL | Status: AC
  Administered 2019-07-07: 2 mg via ORAL
  Filled 2019-07-07: qty 1

## 2019-07-07 MED ORDER — HALOPERIDOL DECANOATE 100 MG/ML IM SOLN
75.0000 mg | Freq: Once | INTRAMUSCULAR | Status: AC
  Administered 2019-07-07: 75 mg via INTRAMUSCULAR
  Filled 2019-07-07: qty 0.75

## 2019-07-07 NOTE — ED Notes (Signed)
Pt given graham crackers,peanut butter and sprite. 

## 2019-07-07 NOTE — Final Progress Note (Signed)
Physician Final Progress Note  Patient ID: [name] MRN: [id number] DOB/AGE: 03-22-1990 28 y.o.  Admit date: 07/02/2019 Admitting provider:  ER MD and Psych MD   Discharge date: 07/07/2019   Admission Diagnoses:  Schizoaffective disorder  Discharge Diagnoses:  Active Problems:   * No active hospital problems. *  Schizoaffective disorder   Consults:  Psych and TTS  Significant Findings/ Diagnostic Studies: Procedures: None  Discharge Condition: fair   Patient remained in Psych ER --and was stabilized again after being released from our unit --recently.  However he got in trouble via theft and has to go to jail now  He stabilized with a change in medications and was given Haldol D at the time of discharge   He at least has less psychosis and mood is somewhat better, he says the meds given now feel better than original Abilify  Mental Status   Alert somewhat cooperative oriented to person place date time  Psychosis and mood somewhat improved No active SI HI or plans contracts for safety He is aware he has to go to jail after this injectable  Speech low tone volume rate Concentration and attention fair Consciousness not clouded or fluctuant No other movement problems including dystonia, EPS akathisia     Disposition:  There are no questions and answers to display.        Diet: regular  Discharge Activity: {CHL DISCHARGE  As tolerated       Total time spent taking care of this patient:74minutes  Signed: Roselind Messier 07/07/2019, 10:34 AM

## 2019-07-07 NOTE — BH Assessment (Signed)
Follow Up with the following facilities:  [facility]-[phone]) Will refax   [facility] [phone]) No answer  [facility] [phone] -[phone] reports denied due to acuity & aggression hx

## 2019-07-07 NOTE — ED Notes (Signed)
Patient on phone with mom at this time  

## 2019-07-07 NOTE — ED Notes (Signed)
Pt given dinner tray and sprite.  

## 2019-07-07 NOTE — ED Provider Notes (Signed)
Emergency Medicine Observation Re-evaluation Note  [name] is a 29 y.o. male, seen on rounds today.  Pt initially presented to the ED for complaints of Aggressive Behavior Currently, the patient is stable with no acute complaints.  Physical Exam  BP 111/63 (BP Location: Right Arm)    Pulse 98    Temp 98 F (36.7 C) (Oral)    Resp 18    Ht 5\' 10"  (1.778 m)    Wt 68 kg    SpO2 99%    BMI 21.52 kg/m  Physical Exam   General: No apparent distress HEENT: moist mucous membranes CV: RRR Pulm: Normal WOB GI: soft and non tender MSK: no edema or cyanosis Neuro: face symmetric, moving all extremities    ED Course / MDM  EKG:    I have reviewed the labs performed to date as well as medications administered while in observation.  No acute events overnight Plan  Current plan is for psychiatric disposition Patient is under full IVC at this time.   , Don Perking, MD 07/07/19 [phone]

## 2019-07-07 NOTE — BH Assessment (Addendum)
Re-faxed Referralinformation to following facilities;   [facility] [phone]),    [facility] [phone], [phone], [phone] or [phone]),    [facility] [phone] or [phone])   [facility] [phone]), Denied due to current criminal charges   [facility] [phone])   [facility] [phone])   [facility] [phone]), No beds currently available

## 2019-07-07 NOTE — BH Assessment (Signed)
Writer spoke with the patient to complete an updated/reassessment. Patient denies SI/HI and AV/H. Patient no longer meet inpatient criteria and has been psych cleared per Psych MD.

## 2019-07-08 NOTE — Discharge Instructions (Signed)
You have been seen in the emergency department for a  psychiatric concern. You have been evaluated both medically as well as psychiatrically. Please follow-up with your outpatient resources provided. Return to the emergency department for any worsening symptoms, or any thoughts of hurting yourself or anyone else so that we may attempt to help you. 

## 2019-07-19 ENCOUNTER — Encounter: Payer: Self-pay | Admitting: Emergency Medicine

## 2019-07-19 ENCOUNTER — Emergency Department
Admission: EM | Admit: 2019-07-19 | Discharge: 2019-07-19 | Disposition: A | Discharge status: Home / Self Care | Payer: Medicare Other | Attending: Emergency Medicine | Admitting: Emergency Medicine

## 2019-07-19 ENCOUNTER — Emergency Department: Payer: Medicare Other

## 2019-07-19 ENCOUNTER — Other Ambulatory Visit: Payer: Self-pay

## 2019-07-19 DIAGNOSIS — Y939 Activity, unspecified: Secondary | ICD-10-CM | POA: Insufficient documentation

## 2019-07-19 DIAGNOSIS — S0101XA Laceration without foreign body of scalp, initial encounter: Secondary | ICD-10-CM | POA: Insufficient documentation

## 2019-07-19 DIAGNOSIS — S0990XA Unspecified injury of head, initial encounter: Secondary | ICD-10-CM | POA: Diagnosis present

## 2019-07-19 DIAGNOSIS — W228XXA Striking against or struck by other objects, initial encounter: Secondary | ICD-10-CM | POA: Diagnosis not present

## 2019-07-19 DIAGNOSIS — Y999 Unspecified external cause status: Secondary | ICD-10-CM | POA: Diagnosis not present

## 2019-07-19 DIAGNOSIS — Y929 Unspecified place or not applicable: Secondary | ICD-10-CM | POA: Diagnosis not present

## 2019-07-19 NOTE — ED Notes (Signed)
Patient discharged back to jail with officers, patient received discharge papers. Patient got belongings and verbalized he has received all of his belongings. Patient appropriate and cooperative, Denies SI/HI AVH. Says he can be safe, since he is transferring to safe care unit tomorrow. Vital signs taken. NAD noted.

## 2019-07-19 NOTE — ED Notes (Signed)
Patient here from jail and assisted by two sheriffs, patient stated that he intentionally hit his head on his jail cell door, patient stated he tried to kill himself. Pt with 3 small lacerations to left side of head, forehead and back of head, bleeding controlled at this time. NAD noted, patient A&Ox4. Patient begging doctor to be admitted

## 2019-07-19 NOTE — ED Provider Notes (Signed)
ER Provider Note       Time seen: 3:51 PM    I have reviewed the vital signs and the nursing notes.  HISTORY   Chief Complaint Suicidal and Head Injury   HPI [name] is a 29 y.o. male with a history of schizophrenia who presents today for a head injury.  Patient arrives in custody with the sheriff's department.  Try to kill himself by slamming his head on the wall.  Had some small lacerations on the back of his head.  He denies loss of consciousness.  Past Medical History:  Diagnosis Date  . Schizo affective schizophrenia (HCC)   . Schizophrenia (HCC)     History reviewed. No pertinent surgical history.  Allergies Ibuprofen, Risperidone and related, Ziprasidone, Benztropine, and Quetiapine  Review of Systems Constitutional: Negative for fever. Cardiovascular: Negative for chest pain. Respiratory: Negative for shortness of breath. Gastrointestinal: Negative for abdominal pain, vomiting and diarrhea. Musculoskeletal: Negative for back pain. Skin: Positive for scalp laceration Neurological: Positive for headache  All systems negative/normal/unremarkable except as stated in the HPI  ____________________________________________   PHYSICAL EXAM:  VITAL SIGNS: Vitals:   07/19/19 1440  BP: 131/79  Pulse: (!) 102  Resp: 20  Temp: 98.3 F (36.8 C)  SpO2: 100%    Constitutional: Alert and oriented. Well appearing and in no distress. Eyes: Conjunctivae are normal. Normal extraocular movements. ENT      Head: Normocephalic, posterior scalp laceration is noted      Nose: No congestion/rhinnorhea.      Mouth/Throat: Mucous membranes are moist.      Neck: No stridor. Cardiovascular: Normal rate, regular rhythm. No murmurs, rubs, or gallops. Respiratory: Normal respiratory effort without tachypnea nor retractions. Breath sounds are clear and equal bilaterally. No wheezes/rales/rhonchi. Musculoskeletal: Nontender with normal range of motion in extremities. No  lower extremity tenderness nor edema. Neurologic:  Normal speech and language. No gross focal neurologic deficits are appreciated.  Skin:  Skin is warm, dry, 2 cm parieto-occipital scalp laceration is noted Psychiatric: Bizarre mood and behavior at times  ____________________________________________   .Marland KitchenLaceration Repair  Date/Time: 07/19/2019 4:09 PM Performed by: Emily Filbert, MD Authorized by: Emily Filbert, MD   Consent:    Consent obtained:  Emergent situation Anesthesia (see MAR for exact dosages):    Anesthesia method:  None Laceration details:    Location:  Scalp   Scalp location:  Occipital   Length (cm):  2   Depth (mm):  3 Repair type:    Repair type:  Simple Exploration:    Contaminated: no   Treatment:    Irrigation solution:  Sterile saline Skin repair:    Repair method:  Staples   Number of staples:  2 Approximation:    Approximation:  Close Post-procedure details:    Dressing:  Open (no dressing)   Patient tolerance of procedure:  Tolerated well, no immediate complications    RADIOLOGY  Images were viewed by me CT head Is unremarkable DIFFERENTIAL DIAGNOSIS  Schizophrenia, head injury, subdural, laceration  ASSESSMENT AND PLAN  Head injury, scalp laceration   Plan: The patient had presented for head injury and scalp laceration.  Wound was repaired as dictated above.  CT imaging was unremarkable.  Sheriff department has arranged for placement at [facility].  He is cleared to return to [facility] department custody  Daryel November MD    Note: This note was generated in part or whole with voice recognition software. Voice recognition is usually  quite accurate but there are transcription errors that can and very often do occur. I apologize for any typographical errors that were not detected and corrected.     Emily Filbert, MD 07/19/19 [phone]

## 2019-07-19 NOTE — ED Triage Notes (Signed)
Pt in via Emergency planning/management officer in custody...Greg KitchenMarland Kitchenthe patient states he tried to kill himself by slamming his head on the wall. Pt with 3 small lacerations to left side of head, forehead and back of head, bleeding controlled at this time. No LOC.

## 2019-07-19 NOTE — ED Notes (Signed)
Dr. Mayford Knife placed 2 staples in back of patients head

## 2019-07-19 NOTE — ED Notes (Signed)
Patient transported to CT 

## 2020-01-04 DIAGNOSIS — Z0289 Encounter for other administrative examinations: Secondary | ICD-10-CM

## 2021-09-02 IMAGING — CT CT HEAD W/O CM
3 of 4 series · 16 of 47 positions shown, 19 images · non-contrast
Comparison: 04/01/2019

CLINICAL DATA: Headache.  Head trauma.

EXAM:
CT HEAD WITHOUT CONTRAST
TECHNIQUE: Contiguous axial images were obtained from the base of the skull
through the vertex without intravenous contrast.

[Series 3: head wo · axial · 0.49mm/px · z∈[-278,-133]mm · 10 of 33 slices shown, 13 images]
[im 2/33  brain]
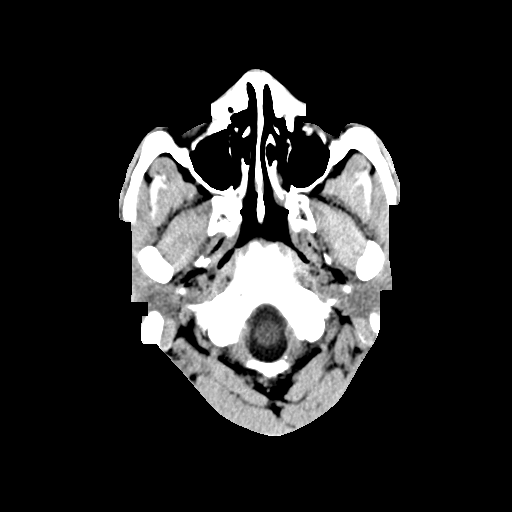
[im 2/33  bone]
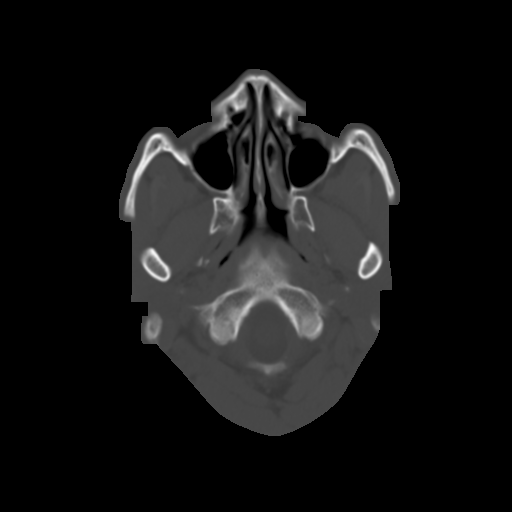
[im 6/33  brain]
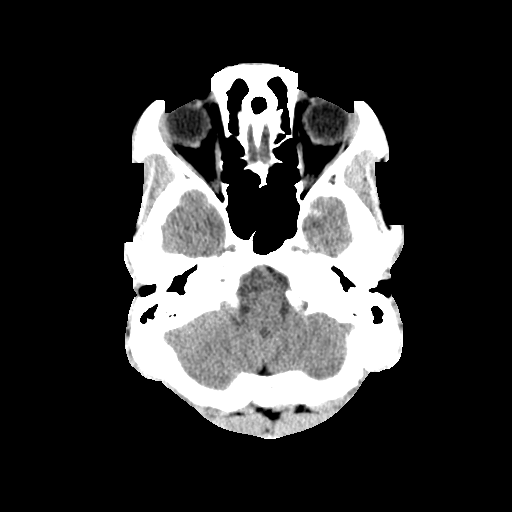
[im 9/33  brain]
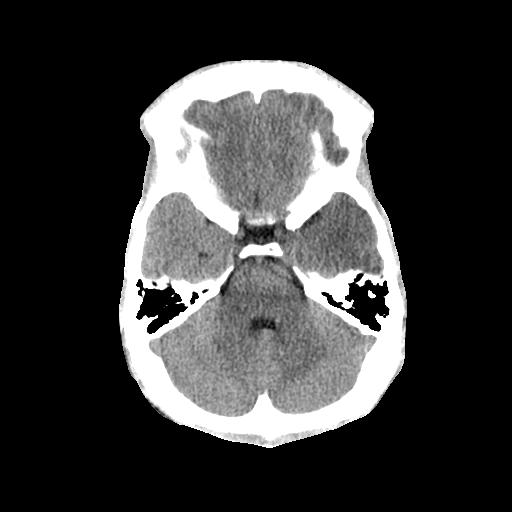
[im 12/33  brain]
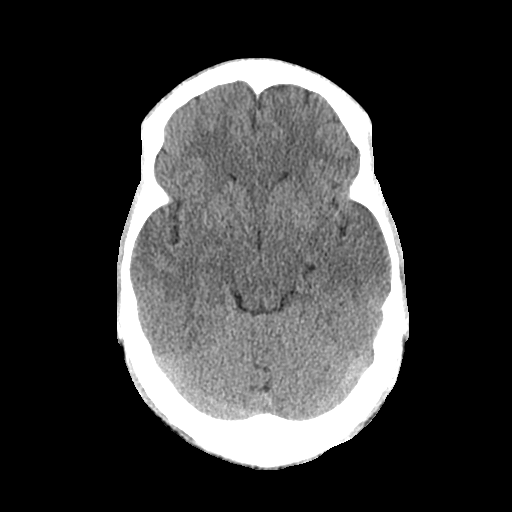
[im 16/33  brain]
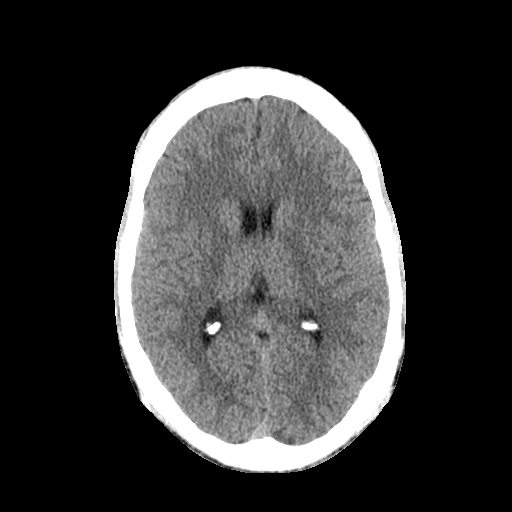
[im 16/33  bone]
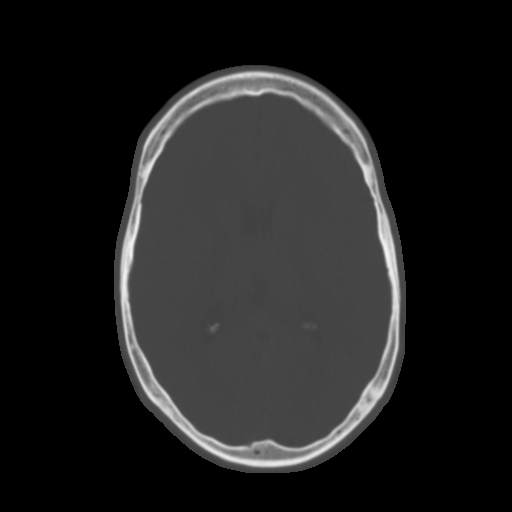
[im 17/33  brain]
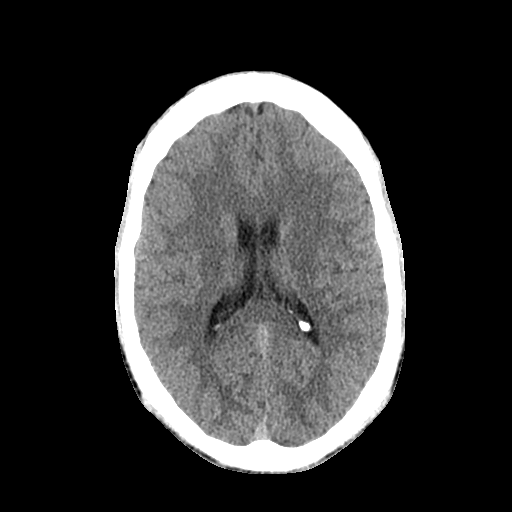
[im 21/33  brain]
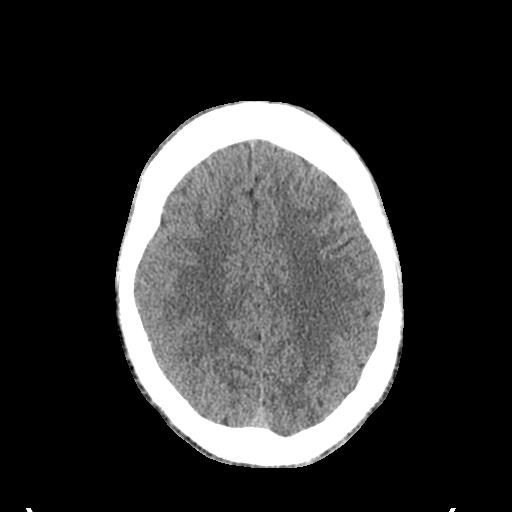
[im 24/33  brain]
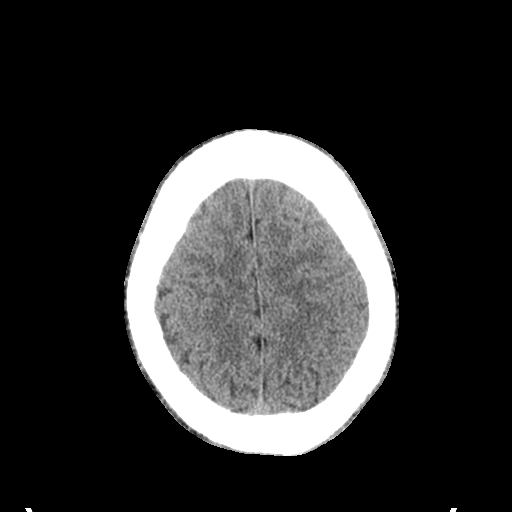
[im 27/33  brain]
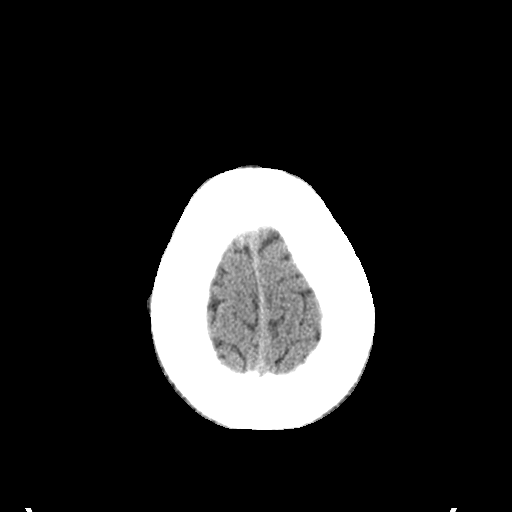
[im 27/33  bone]
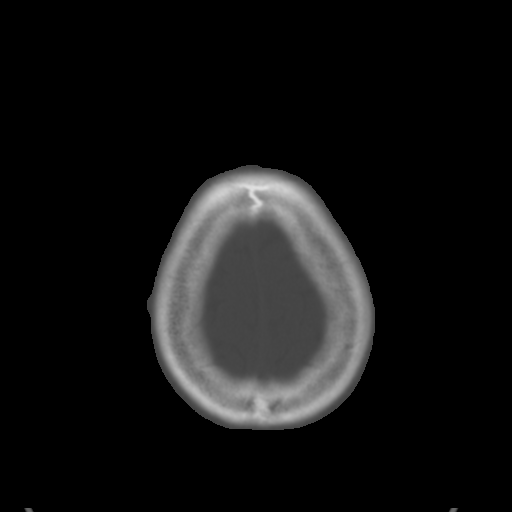
[im 31/33  brain]
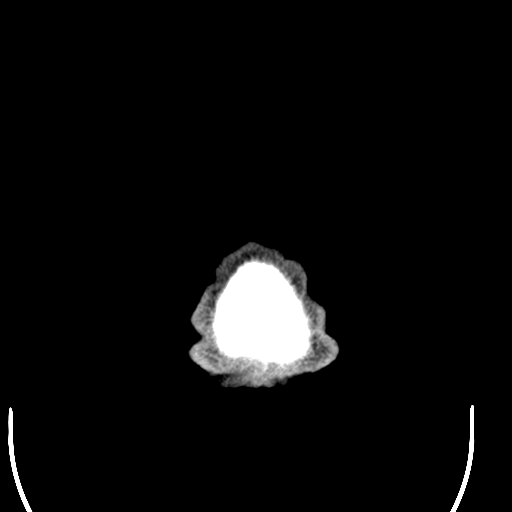

[Series 4: coronal soft tissue · coronal · 0.33mm/px · 3 of 69 slices shown]
[im 23/69  brain]
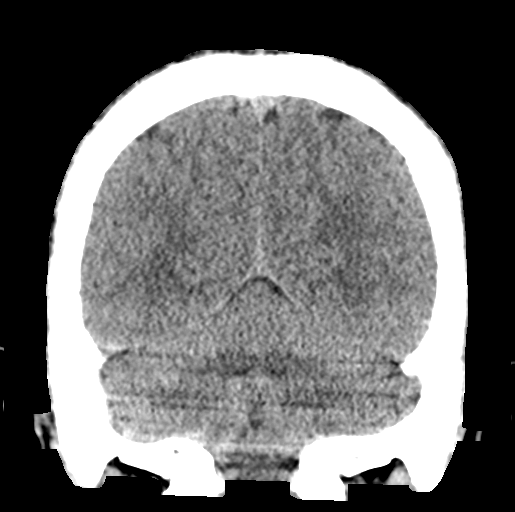
[im 31/69  brain]
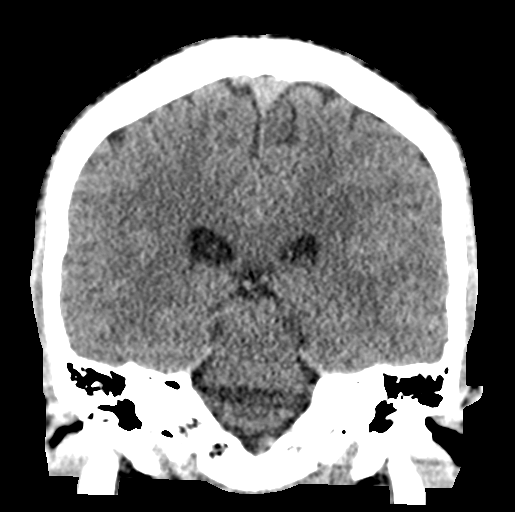
[im 38/69  brain]
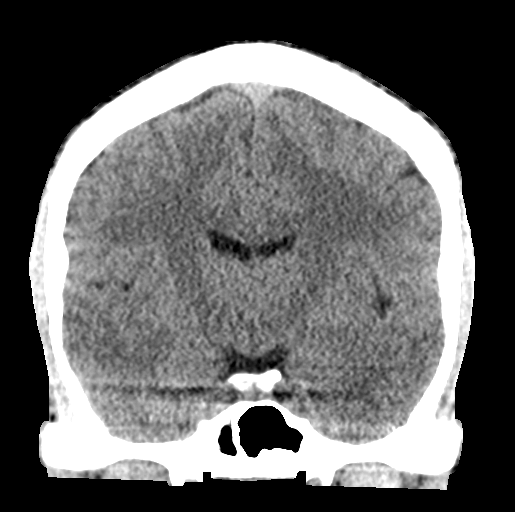

[Series 5: sagittal soft tissue · sagittal · 0.33mm/px · 3 of 54 slices shown]
[im 18/54  brain]
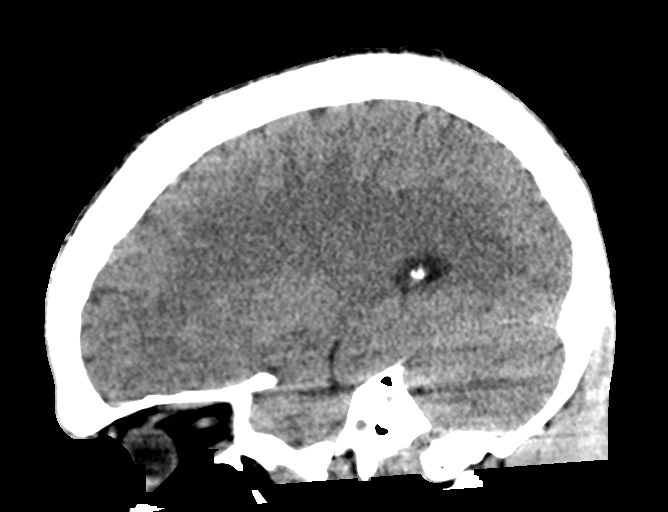
[im 27/54  brain]
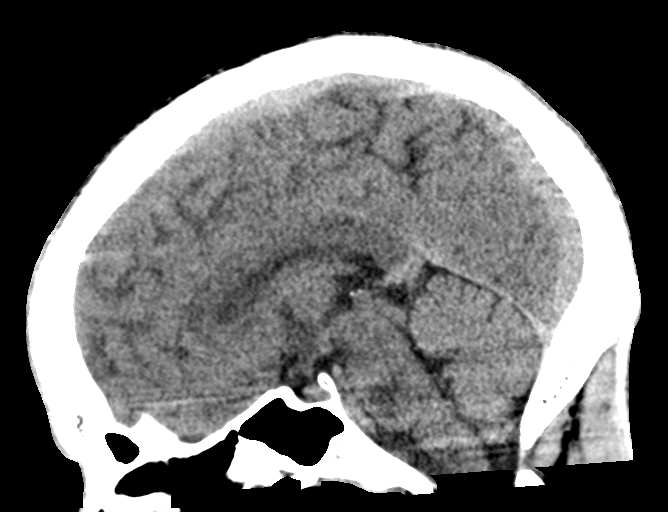
[im 36/54  brain]
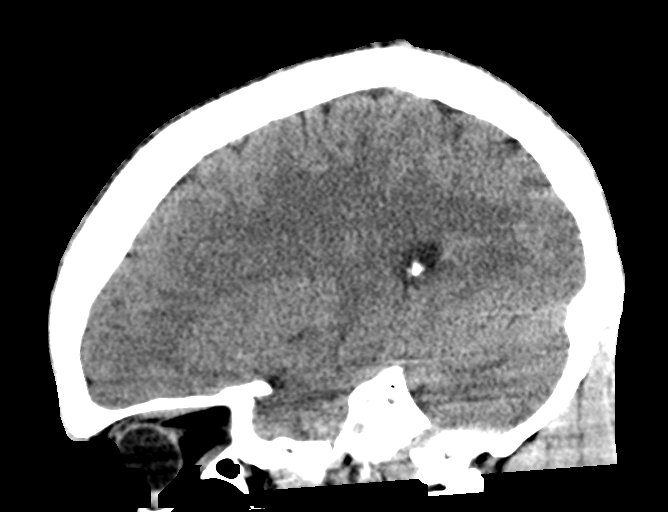

[16 of 47 positions shown; findings below may reference images not displayed]

FINDINGS: Brain: The brain shows a normal appearance without evidence of
malformation, atrophy, old or acute small or large vessel
infarction, mass lesion, hemorrhage, hydrocephalus or extra-axial
collection.

Vascular: No hyperdense vessel. No evidence of atherosclerotic
calcification.

Skull: Normal.  No traumatic finding.  No focal bone lesion.

Sinuses/Orbits: Sinuses are clear. Orbits appear normal. Mastoids
are clear.

Other: None significant
IMPRESSION: Normal head CT

## 2022-04-27 ENCOUNTER — Emergency Department
Admission: EM | Admit: 2022-04-27 | Discharge: 2022-04-28 | Disposition: A | Discharge status: Home / Self Care | Payer: Medicare Other | Attending: Emergency Medicine | Admitting: Emergency Medicine

## 2022-04-27 ENCOUNTER — Other Ambulatory Visit: Payer: Self-pay

## 2022-04-27 DIAGNOSIS — S61511A Laceration without foreign body of right wrist, initial encounter: Secondary | ICD-10-CM | POA: Diagnosis not present

## 2022-04-27 DIAGNOSIS — X58XXXA Exposure to other specified factors, initial encounter: Secondary | ICD-10-CM | POA: Insufficient documentation

## 2022-04-27 DIAGNOSIS — F1414 Cocaine abuse with cocaine-induced mood disorder: Secondary | ICD-10-CM | POA: Diagnosis present

## 2022-04-27 DIAGNOSIS — F19950 Other psychoactive substance use, unspecified with psychoactive substance-induced psychotic disorder with delusions: Secondary | ICD-10-CM | POA: Diagnosis present

## 2022-04-27 DIAGNOSIS — F209 Schizophrenia, unspecified: Secondary | ICD-10-CM

## 2022-04-27 DIAGNOSIS — F17200 Nicotine dependence, unspecified, uncomplicated: Secondary | ICD-10-CM | POA: Insufficient documentation

## 2022-04-27 DIAGNOSIS — F259 Schizoaffective disorder, unspecified: Secondary | ICD-10-CM | POA: Diagnosis present

## 2022-04-27 DIAGNOSIS — F200 Paranoid schizophrenia: Secondary | ICD-10-CM | POA: Diagnosis not present

## 2022-04-27 DIAGNOSIS — F1990 Other psychoactive substance use, unspecified, uncomplicated: Secondary | ICD-10-CM

## 2022-04-27 DIAGNOSIS — F1494 Cocaine use, unspecified with cocaine-induced mood disorder: Secondary | ICD-10-CM | POA: Diagnosis present

## 2022-04-27 DIAGNOSIS — F1520 Other stimulant dependence, uncomplicated: Secondary | ICD-10-CM | POA: Diagnosis present

## 2022-04-27 LAB — URINE DRUG SCREEN, QUALITATIVE (ARMC ONLY)
Amphetamines, Ur Screen: NOT DETECTED
Barbiturates, Ur Screen: NOT DETECTED
Benzodiazepine, Ur Scrn: NOT DETECTED
Cannabinoid 50 Ng, Ur ~~LOC~~: NOT DETECTED
Cocaine Metabolite,Ur ~~LOC~~: POSITIVE — AB
MDMA (Ecstasy)Ur Screen: NOT DETECTED
Methadone Scn, Ur: NOT DETECTED
Opiate, Ur Screen: NOT DETECTED
Phencyclidine (PCP) Ur S: NOT DETECTED
Tricyclic, Ur Screen: NOT DETECTED

## 2022-04-27 LAB — CBC
HCT: 44.5 % (ref 39.0–52.0)
Hemoglobin: 14.7 g/dL (ref 13.0–17.0)
MCH: 28.2 pg (ref 26.0–34.0)
MCHC: 33 g/dL (ref 30.0–36.0)
MCV: 85.2 fL (ref 80.0–100.0)
Platelets: 228 10*3/uL (ref 150–400)
RBC: 5.22 MIL/uL (ref 4.22–5.81)
RDW: 13.2 % (ref 11.5–15.5)
WBC: 10.8 10*3/uL — ABNORMAL HIGH (ref 4.0–10.5)
nRBC: 0 % (ref 0.0–0.2)

## 2022-04-27 LAB — COMPREHENSIVE METABOLIC PANEL
ALT: 23 U/L (ref 0–44)
AST: 22 U/L (ref 15–41)
Albumin: 3.8 g/dL (ref 3.5–5.0)
Alkaline Phosphatase: 68 U/L (ref 38–126)
Anion gap: 7 (ref 5–15)
BUN: 8 mg/dL (ref 6–20)
CO2: 24 mmol/L (ref 22–32)
Calcium: 8.9 mg/dL (ref 8.9–10.3)
Chloride: 111 mmol/L (ref 98–111)
Creatinine, Ser: 1.11 mg/dL (ref 0.61–1.24)
GFR, Estimated: >60 mL/min (ref >60)
Glucose, Bld: 114 mg/dL — ABNORMAL HIGH (ref 70–99)
Potassium: 3.4 mmol/L — ABNORMAL LOW (ref 3.5–5.1)
Sodium: 142 mmol/L (ref 135–145)
Total Bilirubin: 0.3 mg/dL (ref 0.3–1.2)
Total Protein: 7.7 g/dL (ref 6.5–8.1)

## 2022-04-27 LAB — SALICYLATE LEVEL: Salicylate Lvl: <7 mg/dL — ABNORMAL LOW (ref 7.0–30.0)

## 2022-04-27 LAB — ETHANOL: Alcohol, Ethyl (B): <10 mg/dL (ref <10)

## 2022-04-27 LAB — ACETAMINOPHEN LEVEL: Acetaminophen (Tylenol), Serum: <10 ug/mL — ABNORMAL LOW (ref 10–30)

## 2022-04-27 LAB — VALPROIC ACID LEVEL: Valproic Acid Lvl: <10 ug/mL — ABNORMAL LOW (ref 50.0–100.0)

## 2022-04-27 NOTE — Consult Note (Signed)
[facility] Face-to-Face Psychiatry Consult   Reason for Consult: Psychiatric Evaluation  Referring Physician: Dr. Larinda Buttery Patient Identification: [name] MRN:  [id number] Principal Diagnosis: <principal problem not specified> Diagnosis:  Active Problems:   Cocaine abuse with cocaine-induced mood disorder   Self-inflicted laceration of right wrist   Schizophrenia, paranoid type   Schizoaffective disorder   Total Time spent with patient: 1 hour  Subjective: " I took an extra of my night medications so I could sleep. I did that week."   [name] is a 32 y.o. male patient presented to [facility] Emergency Department by the [facility] Department under involuntary commitment status (IVC). According to the IVC paperwork, the patient claimed no current suicidal or homicidal ideations but stated he was there because he took an extra sleeping pill the previous Sunday night. Notably, the patient has a history of schizophrenia and multiple suicide attempts. Per the ACT team staff, the patient has been inconsistently taking his medications, requiring more frequent psychiatric evaluations to manage his treatment effectively. The patient UDS is remarkable for cocaine, unremarkable for alcohol and he is non-complaint with his medications. During evaluation by this provider on 04/27/2022, it was determined in consultation with Dr. Larinda Buttery that the patient does not meet criteria for admission to the psychiatric inpatient unit. The patient appeared alert, oriented, loud but cooperative, with mood-congruent affect, and showed no signs of responding to internal or external stimuli or presenting delusional thinking. Additionally, the patient denied auditory or visual hallucinations, suicidal, homicidal, or self-harm ideations, and did not exhibit psychotic or paranoid behaviors. Collaterally, the patient's mother, [name], expressed frustration with the [facility], alleging false  reports about the patient and planning to file a grievance. Further discussions with the [facility], [name], revealed that the patient admitted to overdosing on prescribed medications the previous Sunday night, resulting in violent vomiting, loss of bowel control, and falls. [name] noted a history of medication mismanagement and implemented a safety plan, leading to the patient's involuntary commitment to ensure proper precautions were taken.  Disposition: - Patient does not meet the criteria for psychiatric inpatient admission   HPI: Per Dr. Larinda Buttery, [name] is a 32 y.o. male with past medical history of schizophrenia, cocaine abuse, and intellectual disability who presents to the ED for psychiatric valuation.  Patient reports that he is here because he took some extra of his medication 3 nights ago so that he could "go to sleep."  He denies any attempt to harm himself, states that he was only having some difficulty going to sleep and wanted to see if the medication would help.  He reports speaking with his ACT team about this and they were concerned that he was suicidal.  Patient reportedly with multiple suicide attempts in the past.  Patient states he has otherwise been compliant with his medications, denies any drug use.  He denies any medical complaints at this time.  He arrives under IVC put in place by his ACT team.   Past Psychiatric History:  Schizo affective schizophrenia Schizophrenia   Risk to Self:   Risk to Others:   Prior Inpatient Therapy:   Prior Outpatient Therapy:    Past Medical History:  Past Medical History:  Diagnosis Date   Schizo affective schizophrenia    Schizophrenia    History reviewed. No pertinent surgical history. Family History: History reviewed. No pertinent family history. Family Psychiatric  History:  Social History:  Social History   Substance  and Sexual Activity  Alcohol Use Not Currently     Social History   Substance  and Sexual Activity  Drug Use Yes   Types: Cocaine, Marijuana   Comment: crack 06-26-19    Social History   Socioeconomic History   Marital status: Single    Spouse name: Not on file   Number of children: Not on file   Years of education: Not on file   Highest education level: Not on file  Occupational History   Not on file  Tobacco Use   Smoking status: Every Day   Smokeless tobacco: Never  Substance and Sexual Activity   Alcohol use: Not Currently   Drug use: Yes    Types: Cocaine, Marijuana    Comment: crack 06-26-19   Sexual activity: Not on file  Other Topics Concern   Not on file  Social History Narrative   Not on file   Social Determinants of Health   Financial Resource Strain: Not on file  Food Insecurity: Not on file  Transportation Needs: Not on file  Physical Activity: Not on file  Stress: Not on file  Social Connections: Not on file   Additional Social History:    Allergies:   Allergies  Allergen Reactions   Ibuprofen Swelling    Tongue swelling   Risperidone And Related Other (See Comments) and Swelling    gynecomastia gynecomastia    Ziprasidone Swelling    Tongue swells   Benztropine Other (See Comments)    Causes confusion, depression, and delusions   Quetiapine Other (See Comments)    Depression, suicidality, adverse effect: seizures     Labs:  Results for orders placed or performed during the hospital encounter of 04/27/22 (from the past 48 hour(s))  Comprehensive metabolic panel     Status: Abnormal   Collection Time: 04/27/22  8:54 PM  Result Value Ref Range   Sodium 142 135 - 145 mmol/L   Potassium 3.4 (L) 3.5 - 5.1 mmol/L   Chloride 111 98 - 111 mmol/L   CO2 24 22 - 32 mmol/L   Glucose, Bld 114 (H) 70 - 99 mg/dL    Comment: Glucose reference range applies only to samples taken after fasting for at least 8 hours.   BUN 8 6 - 20 mg/dL   Creatinine, Ser 1.61 0.61 - 1.24 mg/dL   Calcium 8.9 8.9 - 09.6 mg/dL   Total Protein 7.7  6.5 - 8.1 g/dL   Albumin 3.8 3.5 - 5.0 g/dL   AST 22 15 - 41 U/L   ALT 23 0 - 44 U/L   Alkaline Phosphatase 68 38 - 126 U/L   Total Bilirubin 0.3 0.3 - 1.2 mg/dL   GFR, Estimated >04 >54 mL/min    Comment: (NOTE) Calculated using the CKD-EPI Creatinine Equation (2021)    Anion gap 7 5 - 15    Comment: Performed at [facility], [address], [place], [place] [address]  Ethanol     Status: None   Collection Time: 04/27/22  8:54 PM  Result Value Ref Range   Alcohol, Ethyl (B) <10 <10 mg/dL    Comment: (NOTE) Lowest detectable limit for serum alcohol is 10 mg/dL.  For medical purposes only. Performed at [facility], [address] [place], [place] [address]   Salicylate level     Status: Abnormal   Collection Time: 04/27/22  8:54 PM  Result Value Ref Range   Salicylate Lvl <7.0 (L) 7.0 - 30.0 mg/dL  Comment: Performed at [facility], [address] [place], [place] [address]  Acetaminophen level     Status: Abnormal   Collection Time: 04/27/22  8:54 PM  Result Value Ref Range   Acetaminophen (Tylenol), Serum <10 (L) 10 - 30 ug/mL    Comment: (NOTE) Therapeutic concentrations vary significantly. A range of 10-30 ug/mL  may be an effective concentration for many patients. However, some  are best treated at concentrations outside of this range. Acetaminophen concentrations >150 ug/mL at 4 hours after ingestion  and >50 ug/mL at 12 hours after ingestion are often associated with  toxic reactions.  Performed at [facility], [address] [place], [place] [address]   cbc     Status: Abnormal   Collection Time: 04/27/22  8:54 PM  Result Value Ref Range   WBC 10.8 (H) 4.0 - 10.5 K/uL   RBC 5.22 4.22 - 5.81 MIL/uL   Hemoglobin 14.7 13.0 - 17.0 g/dL   HCT 09.8 11.9 - 14.7 %   MCV 85.2 80.0 - 100.0 fL   MCH 28.2 26.0 - 34.0 pg   MCHC 33.0 30.0 - 36.0 g/dL   RDW 82.9 56.2 - 13.0 %   Platelets 228 150 - 400 K/uL   nRBC 0.0 0.0  - 0.2 %    Comment: Performed at [facility], [address] [place], [place] [address]  Urine Drug Screen, Qualitative     Status: Abnormal   Collection Time: 04/27/22  8:54 PM  Result Value Ref Range   Tricyclic, Ur Screen NONE DETECTED NONE DETECTED   Amphetamines, Ur Screen NONE DETECTED NONE DETECTED   MDMA (Ecstasy)Ur Screen NONE DETECTED NONE DETECTED   Cocaine Metabolite,Ur Marueno POSITIVE (A) NONE DETECTED   Opiate, Ur Screen NONE DETECTED NONE DETECTED   Phencyclidine (PCP) Ur S NONE DETECTED NONE DETECTED   Cannabinoid 50 Ng, Ur Dolores NONE DETECTED NONE DETECTED   Barbiturates, Ur Screen NONE DETECTED NONE DETECTED   Benzodiazepine, Ur Scrn NONE DETECTED NONE DETECTED   Methadone Scn, Ur NONE DETECTED NONE DETECTED    Comment: (NOTE) Tricyclics + metabolites, urine    Cutoff 1000 ng/mL Amphetamines + metabolites, urine  Cutoff 1000 ng/mL MDMA (Ecstasy), urine              Cutoff 500 ng/mL Cocaine Metabolite, urine          Cutoff 300 ng/mL Opiate + metabolites, urine        Cutoff 300 ng/mL Phencyclidine (PCP), urine         Cutoff 25 ng/mL Cannabinoid, urine                 Cutoff 50 ng/mL Barbiturates + metabolites, urine  Cutoff 200 ng/mL Benzodiazepine, urine              Cutoff 200 ng/mL Methadone, urine                   Cutoff 300 ng/mL  The urine drug screen provides only a preliminary, unconfirmed analytical test result and should not be used for non-medical purposes. Clinical consideration and professional judgment should be applied to any positive drug screen result due to possible interfering substances. A more specific alternate chemical method must be used in order to obtain a confirmed analytical result. Gas chromatography / mass spectrometry (GC/MS) is the preferred confirm atory method. Performed at [facility], [address] [place], [place] [address]   Valproic acid level  Status: Abnormal   Collection Time: 04/27/22  8:54 PM   Result Value Ref Range   Valproic Acid Lvl <10 (L) 50.0 - 100.0 ug/mL    Comment: RESULT CONFIRMED BY MANUAL DILUTION SKL Performed at [facility], [address] [place], [place] [address]     No current facility-administered medications for this encounter.   Current Outpatient Medications  Medication Sig Dispense Refill   ARIPiprazole (ABILIFY) 15 MG tablet Take 1 tablet (15 mg total) by mouth daily. 30 tablet 1   divalproex (DEPAKOTE) 500 MG DR tablet Take 1 tablet (500 mg total) by mouth every morning. 30 tablet 1    Musculoskeletal: Strength & Muscle Tone: within normal limits Gait & Station: normal Patient leans: N/A  Psychiatric Specialty Exam:  Presentation  General Appearance:  Appropriate for Environment  Eye Contact: Good  Speech: Clear and Coherent  Speech Volume: Normal  Handedness: Right   Mood and Affect  Mood: Euthymic  Affect: Congruent   Thought Process  Thought Processes: Coherent  Descriptions of Associations:Intact  Orientation:Full (Time, Place and Person)  Thought Content:Logical  History of Schizophrenia/Schizoaffective disorder:No data recorded Duration of Psychotic Symptoms:No data recorded Hallucinations:Hallucinations: None  Ideas of Reference:None  Suicidal Thoughts:Suicidal Thoughts: No  Homicidal Thoughts:Homicidal Thoughts: No   Sensorium  Memory: Immediate Good; Recent Good; Remote Good  Judgment: Fair  Insight: Fair   Art therapist  Concentration: Good  Attention Span: Good  Recall: Good  Fund of Knowledge: Good  Language: Good   Psychomotor Activity  Psychomotor Activity: Psychomotor Activity: Normal   Assets  Assets: Communication Skills; Desire for Improvement; Financial Resources/Insurance; Housing; Leisure Time; Resilience; Social Support   Sleep  Sleep: Sleep: Fair   Physical Exam: Physical Exam Vitals and nursing note reviewed.  Constitutional:       Appearance: Normal appearance. He is normal weight.  HENT:     Head: Normocephalic and atraumatic.     Right Ear: External ear normal.     Left Ear: External ear normal.     Nose: Nose normal.  Cardiovascular:     Rate and Rhythm: Normal rate.     Pulses: Normal pulses.  Pulmonary:     Effort: Pulmonary effort is normal.  Musculoskeletal:        General: Normal range of motion.     Cervical back: Normal range of motion and neck supple.  Neurological:     General: No focal deficit present.     Mental Status: He is alert and oriented to person, place, and time.  Psychiatric:        Attention and Perception: Attention and perception normal.        Mood and Affect: Mood and affect normal.        Speech: Speech normal.        Behavior: Behavior normal. Behavior is cooperative.        Thought Content: Thought content normal.        Cognition and Memory: Cognition and memory normal.        Judgment: Judgment normal.    Review of Systems  Psychiatric/Behavioral:  The patient is nervous/anxious.   All other systems reviewed and are negative.  Blood pressure (!) 136/94, pulse (!) 102, temperature 98.9 F (37.2 C), temperature source Oral, resp. rate 16, height  (1.803 m), weight 81.6 kg, SpO2 100 %. Body mass index is 25.1 kg/m.  Treatment Plan Summary: Plan   - Patient does not meet the criteria for psychiatric inpatient  admission  Disposition: No evidence of imminent risk to self or others at present.   Patient does not meet criteria for psychiatric inpatient admission. Supportive therapy provided about ongoing stressors. Discussed crisis plan, support from social network, calling 911, coming to the Emergency Department, and calling Suicide Hotline.  Gillermo Murdoch, NP 04/27/2022 11:45 PM

## 2022-04-27 NOTE — ED Notes (Signed)
ivc/psych consult ordered/pending.. 

## 2022-04-27 NOTE — ED Triage Notes (Signed)
Pt presents to ER with [facility] dept under IVC.  Pt states he has no suicidal or homicidal ideations at this time, and that he is here d/t him taking one extra sleeping pill last night.  Per IVC papers, pt has hx of schizophrenia, and multiple suicide attempts in the past.  Per IVC report, Sunday night, pt was seen vomiting, defecated on himself and repeatedly fell.  Pt has also been taking many of his meds without much oversight, and pt supposedly needs more frequent psych evals to be able to continue his current meds.  Pt is currently A&O x4, and cooperative in triage.

## 2022-04-27 NOTE — ED Notes (Addendum)
Pt dressed out in triage room 1 with this Clinical research associate and EDT Georgiann Hahn.  Pt dressed out into burgundy and blue colored BH scrubs.  Pt belongings placed into pt belongings bag, labeled with pt stickers, and taken to quad area.     Pt belongings: Black wallet  Black touch screen phone  White charging block Black charging cord  Red long sleeve shirt  Wallace Cullens and black jeans  Black, white and red tennis shoes  Blue and yellow socks

## 2022-04-27 NOTE — Consult Note (Incomplete)
[facility] Face-to-Face Psychiatry Consult   Reason for Consult: Psychiatric Evaluation  Referring Physician: Dr. Larinda Buttery Patient Identification: [name] MRN:  [id number] Principal Diagnosis: <principal problem not specified> Diagnosis:  Active Problems:   Cocaine abuse with cocaine-induced mood disorder   Self-inflicted laceration of right wrist   Schizophrenia, paranoid type   Schizoaffective disorder   Total Time spent with patient: 1 hour  Subjective: " I took an extra of my night medications so I could sleep. I did that week."   [name] is a 32 y.o. male patient presented to [facility] ED via [facility] dept under involuntary commitment status (IVC). Per the IVC paper work the patient's states he has no suicidal or homicidal ideations at this time, and that he is here because he took one extra sleeping pill a week from Sunday night.  The patient has a history of schizophrenia, and multiple suicide attempts in the past.Per the ACT team staff Pt has also been taking many of his meds without much oversight, and pt supposedly needs more frequent psych evals to be able to continue his current meds.    The patient was seen face-to-face by this provider; chart reviewed and consulted with Dr. ED providers and Dr. Jenel Lucks who ever doc on call on 04/00/2024 due to the care of the patient. This provider discussed with the EDP that the patient meets/do not meet the criteria for admission to the psychiatric inpatient unit.  On evaluation the patient reports....  During his/her evaluation,  @ is alert and oriented x4, calm, cooperative, and mood-congruent with affect.  The patient does not appear to be responding to internal or external stimuli. Neither is the patient presenting with any delusional thinking. The patient denies auditory or visual hallucinations. The patient denies any suicidal, homicidal, or self-harm ideations. The patient is not presenting with any psychotic or  paranoid behaviors. During an encounter with the patient, he/she was able to answer questions appropriately. Collateral: The patient's mother Yves Dill [phone]. Mother reported that she'd cursed out the pt's Care Coordinator of Frederich Chick due to her having the pt IVC'd and making false reports about the pt. Mother expressed frustration with the Care Coordinator, explaining that she plans to file a grievance.    Psych team also spoke with pt's [facility] Bufford Spikes who reported that last Sunday Night (04/20/2022) into early Monday morning, the pt admitted to overdosing on his prescribed medications. Bufford Spikes reported that the pt began vomiting violently, defecating on himself, and falling. Bufford Spikes reported that the pt has a hx of failing to take his medications appropriately. Bufford Spikes reported that a safety plan was made and she'd had pt IVC'd to make sure all of the proper precautions were taken.    HPI: Per Dr. Larinda Buttery, [name] is a 32 y.o. male with past medical history of schizophrenia, cocaine abuse, and intellectual disability who presents to the ED for psychiatric valuation.  Patient reports that he is here because he took some extra of his medication 3 nights ago so that he could "go to sleep."  He denies any attempt to harm himself, states that he was only having some difficulty going to sleep and wanted to see if the medication would help.  He reports speaking with his ACT team about this and they were concerned that he was suicidal.  Patient reportedly with multiple suicide attempts in the past.  Patient states he has otherwise been compliant with his medications, denies any drug use.  He denies any medical complaints at this time.  He arrives under IVC put in place by his ACT team.   Past Psychiatric History:  Schizo affective schizophrenia Schizophrenia   Risk to Self:   Risk to Others:   Prior Inpatient Therapy:   Prior Outpatient Therapy:    Past Medical  History:  Past Medical History:  Diagnosis Date  . Schizo affective schizophrenia   . Schizophrenia    History reviewed. No pertinent surgical history. Family History: History reviewed. No pertinent family history. Family Psychiatric  History:  Social History:  Social History   Substance and Sexual Activity  Alcohol Use Not Currently     Social History   Substance and Sexual Activity  Drug Use Yes  . Types: Cocaine, Marijuana   Comment: crack 06-26-19    Social History   Socioeconomic History  . Marital status: Single    Spouse name: Not on file  . Number of children: Not on file  . Years of education: Not on file  . Highest education level: Not on file  Occupational History  . Not on file  Tobacco Use  . Smoking status: Every Day  . Smokeless tobacco: Never  Substance and Sexual Activity  . Alcohol use: Not Currently  . Drug use: Yes    Types: Cocaine, Marijuana    Comment: crack 06-26-19  . Sexual activity: Not on file  Other Topics Concern  . Not on file  Social History Narrative  . Not on file   Social Determinants of Health   Financial Resource Strain: Not on file  Food Insecurity: Not on file  Transportation Needs: Not on file  Physical Activity: Not on file  Stress: Not on file  Social Connections: Not on file   Additional Social History:    Allergies:   Allergies  Allergen Reactions  . Ibuprofen Swelling    Tongue swelling  . Risperidone And Related Other (See Comments) and Swelling    gynecomastia gynecomastia   . Ziprasidone Swelling    Tongue swells  . Benztropine Other (See Comments)    Causes confusion, depression, and delusions  . Quetiapine Other (See Comments)    Depression, suicidality, adverse effect: seizures     Labs:  Results for orders placed or performed during the hospital encounter of 04/27/22 (from the past 48 hour(s))  Comprehensive metabolic panel     Status: Abnormal   Collection Time: 04/27/22  8:54 PM  Result  Value Ref Range   Sodium 142 135 - 145 mmol/L   Potassium 3.4 (L) 3.5 - 5.1 mmol/L   Chloride 111 98 - 111 mmol/L   CO2 24 22 - 32 mmol/L   Glucose, Bld 114 (H) 70 - 99 mg/dL    Comment: Glucose reference range applies only to samples taken after fasting for at least 8 hours.   BUN 8 6 - 20 mg/dL   Creatinine, Ser 1.61 0.61 - 1.24 mg/dL   Calcium 8.9 8.9 - 09.6 mg/dL   Total Protein 7.7 6.5 - 8.1 g/dL   Albumin 3.8 3.5 - 5.0 g/dL   AST 22 15 - 41 U/L   ALT 23 0 - 44 U/L   Alkaline Phosphatase 68 38 - 126 U/L   Total Bilirubin 0.3 0.3 - 1.2 mg/dL   GFR, Estimated >04 >54 mL/min    Comment: (NOTE) Calculated using the CKD-EPI Creatinine Equation (2021)    Anion gap 7 5 - 15    Comment: Performed at [facility],  [address] [place], [place] [address]  Ethanol     Status: None   Collection Time: 04/27/22  8:54 PM  Result Value Ref Range   Alcohol, Ethyl (B) <10 <10 mg/dL    Comment: (NOTE) Lowest detectable limit for serum alcohol is 10 mg/dL.  For medical purposes only. Performed at [facility], [address] [place], [place] [address]   Salicylate level     Status: Abnormal   Collection Time: 04/27/22  8:54 PM  Result Value Ref Range   Salicylate Lvl <7.0 (L) 7.0 - 30.0 mg/dL    Comment: Performed at [facility], [address] [place], [place] [address]  Acetaminophen level     Status: Abnormal   Collection Time: 04/27/22  8:54 PM  Result Value Ref Range   Acetaminophen (Tylenol), Serum <10 (L) 10 - 30 ug/mL    Comment: (NOTE) Therapeutic concentrations vary significantly. A range of 10-30 ug/mL  may be an effective concentration for many patients. However, some  are best treated at concentrations outside of this range. Acetaminophen concentrations >150 ug/mL at 4 hours after ingestion  and >50 ug/mL at 12 hours after ingestion are often associated with  toxic reactions.  Performed at [facility], [address] [place], [place] [address]   cbc     Status: Abnormal   Collection Time: 04/27/22  8:54 PM  Result Value Ref Range   WBC 10.8 (H) 4.0 - 10.5 K/uL   RBC 5.22 4.22 - 5.81 MIL/uL   Hemoglobin 14.7 13.0 - 17.0 g/dL   HCT 81.0 25.4 - 86.2 %   MCV 85.2 80.0 - 100.0 fL   MCH 28.2 26.0 - 34.0 pg   MCHC 33.0 30.0 - 36.0 g/dL   RDW 82.4 17.5 - 30.1 %   Platelets 228 150 - 400 K/uL   nRBC 0.0 0.0 - 0.2 %    Comment: Performed at [facility], [address] [place], [place] [address]  Urine Drug Screen, Qualitative     Status: Abnormal   Collection Time: 04/27/22  8:54 PM  Result Value Ref Range   Tricyclic, Ur Screen NONE DETECTED NONE DETECTED   Amphetamines, Ur Screen NONE DETECTED NONE DETECTED   MDMA (Ecstasy)Ur Screen NONE DETECTED NONE DETECTED   Cocaine Metabolite,Ur [place] POSITIVE (A) NONE DETECTED   Opiate, Ur Screen NONE DETECTED NONE DETECTED   Phencyclidine (PCP) Ur S NONE DETECTED NONE DETECTED   Cannabinoid 50 Ng, Ur [place] NONE DETECTED NONE DETECTED   Barbiturates, Ur Screen NONE DETECTED NONE DETECTED   Benzodiazepine, Ur Scrn NONE DETECTED NONE DETECTED   Methadone Scn, Ur NONE DETECTED NONE DETECTED    Comment: (NOTE) Tricyclics + metabolites, urine    Cutoff 1000 ng/mL Amphetamines + metabolites, urine  Cutoff 1000 ng/mL MDMA (Ecstasy), urine              Cutoff 500 ng/mL Cocaine Metabolite, urine          Cutoff 300 ng/mL Opiate + metabolites, urine        Cutoff 300 ng/mL Phencyclidine (PCP), urine         Cutoff 25 ng/mL Cannabinoid, urine                 Cutoff 50 ng/mL Barbiturates + metabolites, urine  Cutoff 200 ng/mL Benzodiazepine, urine              Cutoff 200 ng/mL Methadone, urine  Cutoff 300 ng/mL  The urine drug screen provides only a preliminary, unconfirmed analytical test result and should not be used for non-medical purposes. Clinical consideration and professional judgment should be applied to any positive drug screen  result due to possible interfering substances. A more specific alternate chemical method must be used in order to obtain a confirmed analytical result. Gas chromatography / mass spectrometry (GC/MS) is the preferred confirm atory method. Performed at [facility], [address] [place], [place] [address]   Valproic acid level     Status: Abnormal   Collection Time: 04/27/22  8:54 PM  Result Value Ref Range   Valproic Acid Lvl <10 (L) 50.0 - 100.0 ug/mL    Comment: RESULT CONFIRMED BY MANUAL DILUTION SKL Performed at [facility], [address] [place], [place] [address]     No current facility-administered medications for this encounter.   Current Outpatient Medications  Medication Sig Dispense Refill  . ARIPiprazole (ABILIFY) 15 MG tablet Take 1 tablet (15 mg total) by mouth daily. 30 tablet 1  . divalproex (DEPAKOTE) 500 MG DR tablet Take 1 tablet (500 mg total) by mouth every morning. 30 tablet 1    Musculoskeletal: Strength & Muscle Tone: within normal limits Gait & Station: normal Patient leans: N/A  Psychiatric Specialty Exam:  Presentation  General Appearance:  Appropriate for Environment  Eye Contact: Good  Speech: Clear and Coherent  Speech Volume: Normal  Handedness: Right   Mood and Affect  Mood: Euthymic  Affect: Congruent   Thought Process  Thought Processes: Coherent  Descriptions of Associations:Intact  Orientation:Full (Time, Place and Person)  Thought Content:Logical  History of Schizophrenia/Schizoaffective disorder:No data recorded Duration of Psychotic Symptoms:No data recorded Hallucinations:Hallucinations: None  Ideas of Reference:None  Suicidal Thoughts:Suicidal Thoughts: No  Homicidal Thoughts:Homicidal Thoughts: No   Sensorium  Memory: Immediate Good; Recent Good; Remote Good  Judgment: Fair  Insight: Fair   Art therapist  Concentration: Good  Attention  Span: Good  Recall: Good  Fund of Knowledge: Good  Language: Good   Psychomotor Activity  Psychomotor Activity: Psychomotor Activity: Normal   Assets  Assets: Communication Skills; Desire for Improvement; Financial Resources/Insurance; Housing; Leisure Time; Resilience; Social Support   Sleep  Sleep: Sleep: Fair   Physical Exam: Physical Exam Vitals and nursing note reviewed.  Constitutional:      Appearance: Normal appearance. He is normal weight.  HENT:     Head: Normocephalic and atraumatic.     Right Ear: External ear normal.     Left Ear: External ear normal.     Nose: Nose normal.  Cardiovascular:     Rate and Rhythm: Normal rate.     Pulses: Normal pulses.  Pulmonary:     Effort: Pulmonary effort is normal.  Musculoskeletal:        General: Normal range of motion.     Cervical back: Normal range of motion and neck supple.  Neurological:     General: No focal deficit present.     Mental Status: He is alert and oriented to person, place, and time.  Psychiatric:        Attention and Perception: Attention and perception normal.        Mood and Affect: Mood and affect normal.        Speech: Speech normal.        Behavior: Behavior normal. Behavior is cooperative.        Thought Content: Thought content normal.  Cognition and Memory: Cognition and memory normal.        Judgment: Judgment normal.    Review of Systems  Psychiatric/Behavioral:  The patient is nervous/anxious.   All other systems reviewed and are negative.  Blood pressure (!) 136/94, pulse (!) 102, temperature 98.9 F (37.2 C), temperature source Oral, resp. rate 16, height 5\' 11"  (1.803 m), weight 81.6 kg, SpO2 100 %. Body mass index is 25.1 kg/m.  Treatment Plan Summary: Plan   - Patient does not meet the criteria for psychiatric inpatient admission  Disposition: No evidence of imminent risk to self or others at present.   Patient does not meet criteria for psychiatric  inpatient admission. Supportive therapy provided about ongoing stressors. Discussed crisis plan, support from social network, calling 911, coming to the Emergency Department, and calling Suicide Hotline.  Gillermo Murdoch, NP 04/27/2022 11:45 PM

## 2022-04-27 NOTE — BH Assessment (Signed)
Writer spoke with the patient to complete an assessment. Patient denies SI/HI and AV/H.  Per psych NP [name]., patient does not meet inpatient criteria and can be discharged home.  With pt's permission, this writer spoke with pt's mother [name] [phone]. Mother reported that she'd cursed out the pt's Care Coordinator of Frederich Chick due to her having the pt IVC'd and making false reports about the pt. Mother expressed frustration with the Care Coordinator, explaining that she plans to file a grievance.   Psych team also spoke with pt's [facility] [name] who reported that last Sunday Night (04/20/2022) into early Monday morning, the pt admitted to overdosing on his prescribed medications. [name] reported that the pt began vomiting violently, defecating on himself, and falling. [name] reported that the pt has a hx of failing to take his medications appropriately. [name] reported that a safety plan was made and she'd had pt IVC'd to make sure all of the proper precautions were taken.

## 2022-04-27 NOTE — ED Provider Notes (Signed)
[facility] Provider Note    Event Date/Time   First MD Initiated Contact with Patient 04/27/22 2059     (approximate)   History   Chief Complaint Psychiatric Evaluation  HPI  [name] is a 32 y.o. male with past medical history of schizophrenia, cocaine abuse, and intellectual disability who presents to the ED for psychiatric valuation.  Patient reports that he is here because he took some extra of his medication 3 nights ago so that he could "go to sleep."  He denies any attempt to harm himself, states that he was only having some difficulty going to sleep and wanted to see if the medication would help.  He reports speaking with his ACT team about this and they were concerned that he was suicidal.  Patient reportedly with multiple suicide attempts in the past.  Patient states he has otherwise been compliant with his medications, denies any drug use.  He denies any medical complaints at this time.  He arrives under IVC put in place by his ACT team.     Physical Exam   Triage Vital Signs: ED Triage Vitals  Enc Vitals Group     BP 04/27/22 2043 (!) 136/94     Pulse Rate 04/27/22 2043 (!) 102     Resp 04/27/22 2043 16     Temp 04/27/22 2043 98.9 F (37.2 C)     Temp Source 04/27/22 2043 Oral     SpO2 04/27/22 2043 100 %     Weight 04/27/22 2049 180 lb (81.6 kg)     Height 04/27/22 2049 5\' 11"  (1.803 m)     Head Circumference --      Peak Flow --      Pain Score 04/27/22 2049 0     Pain Loc --      Pain Edu? --      Excl. in GC? --     Most recent vital signs: Vitals:   04/27/22 2043  BP: (!) 136/94  Pulse: (!) 102  Resp: 16  Temp: 98.9 F (37.2 C)  SpO2: 100%    Constitutional: Alert and oriented. Eyes: Conjunctivae are normal. Head: Atraumatic. Nose: No congestion/rhinnorhea. Mouth/Throat: Mucous membranes are moist.  Cardiovascular: Normal rate, regular rhythm. Grossly normal heart sounds.  2+ radial pulses  bilaterally. Respiratory: Normal respiratory effort.  No retractions. Lungs CTAB. Gastrointestinal: Soft and nontender. No distention. Musculoskeletal: No lower extremity tenderness nor edema.  Neurologic:  Normal speech and language. No gross focal neurologic deficits are appreciated.    ED Results / Procedures / Treatments   Labs (all labs ordered are listed, but only abnormal results are displayed) Labs Reviewed  COMPREHENSIVE METABOLIC PANEL - Abnormal; Notable for the following components:      Result Value   Potassium 3.4 (*)    Glucose, Bld 114 (*)    All other components within normal limits  SALICYLATE LEVEL - Abnormal; Notable for the following components:   Salicylate Lvl <7.0 (*)    All other components within normal limits  ACETAMINOPHEN LEVEL - Abnormal; Notable for the following components:   Acetaminophen (Tylenol), Serum <10 (*)    All other components within normal limits  CBC - Abnormal; Notable for the following components:   WBC 10.8 (*)    All other components within normal limits  ETHANOL  URINE DRUG SCREEN, QUALITATIVE ([facility] ONLY)  VALPROIC ACID LEVEL    PROCEDURES:  Critical Care performed: No  Procedures   MEDICATIONS ORDERED  IN ED: Medications - No data to display   IMPRESSION / MDM / ASSESSMENT AND PLAN / ED COURSE  I reviewed the triage vital signs and the nursing notes.                              32 y.o. male with past medical history of schizophrenia, cocaine abuse, and intellectual disability who presents to the ED for psychiatric evaluation after there was concern that he intentionally overdosed on medications, placed under IVC by his ACT team.  Patient's presentation is most consistent with acute presentation with potential threat to life or bodily function.  Differential diagnosis includes, but is not limited to, suicidal ideation, psychosis, overdose, electrode abnormality, AKI, substance abuse, medication  noncompliance.  Patient well-appearing and in no acute distress, vital signs are unremarkable.  He denies any medical complaints and labs are reassuring with no significant anemia, leukocytosis, lecture abnormality, or AKI.  LFTs are unremarkable, Tylenol and salicylate levels are undetectable.  Patient is calm and cooperative here in the ED, may be medically cleared for psychiatric disposition.  The patient has been placed in psychiatric observation due to the need to provide a safe environment for the patient while obtaining psychiatric consultation and evaluation, as well as ongoing medical and medication management to treat the patient's condition.  The patient has been placed under full IVC at this time.      FINAL CLINICAL IMPRESSION(S) / ED DIAGNOSES   Final diagnoses:  Schizophrenia, unspecified type     Rx / DC Orders   ED Discharge Orders     None        Note:  This document was prepared using Dragon voice recognition software and may include unintentional dictation errors.   Chesley Noon, MD 04/27/22 2211

## 2022-04-28 DIAGNOSIS — F209 Schizophrenia, unspecified: Secondary | ICD-10-CM

## 2022-04-28 NOTE — ED Notes (Signed)
Pt discharged with [facility].

## 2022-04-28 NOTE — Discharge Instructions (Signed)
You have been seen in the emergency department for a  psychiatric concern. You have been evaluated both medically as well as psychiatrically. Please follow-up with your outpatient resources provided. Return to the emergency department for any worsening symptoms, or any thoughts of hurting yourself or anyone else so that we may attempt to help you. 

## 2022-04-28 NOTE — ED Notes (Signed)
consult done/patient does not meet the criteria for psychiatric inpatient admission.Marland Kitchen

## 2022-04-28 NOTE — ED Notes (Signed)
ivc rescinded by NP Janee Morn.Marland KitchenMarland KitchenMarland Kitchen

## 2022-04-28 NOTE — ED Notes (Signed)
Spoke with [facility] Department and deputy is being dispatched to transport pt after discharge. Pt does not want to be discharged to home due to mother not being home and pt not having house key. Pt would like to be taken to mother's friends house where mother is currently ([address] [place], [place] [address]).

## 2022-04-28 NOTE — ED Provider Notes (Signed)
-----------------------------------------   2:04 AM on 04/28/2022 ----------------------------------------- Patient has been seen and evaluated by psychiatry.  They believe the patient is safe for discharge home from psychiatric standpoint.  They have rescinded the patient's IVC.  Patient's medical workup is largely nonrevealing besides cocaine positive urine drug screen.  Will discharge the patient with outpatient resources.   Minna Antis, MD 04/28/22 [phone]

## 2022-05-18 ENCOUNTER — Emergency Department
Admission: EM | Admit: 2022-05-18 | Discharge: 2022-05-19 | Disposition: A | Discharge status: Home / Self Care | Payer: Medicare Other | Attending: Emergency Medicine | Admitting: Emergency Medicine

## 2022-05-18 DIAGNOSIS — F200 Paranoid schizophrenia: Secondary | ICD-10-CM | POA: Diagnosis present

## 2022-05-18 DIAGNOSIS — R000 Tachycardia, unspecified: Secondary | ICD-10-CM | POA: Insufficient documentation

## 2022-05-18 DIAGNOSIS — F1410 Cocaine abuse, uncomplicated: Secondary | ICD-10-CM | POA: Diagnosis not present

## 2022-05-18 DIAGNOSIS — R45851 Suicidal ideations: Secondary | ICD-10-CM

## 2022-05-18 DIAGNOSIS — F19950 Other psychoactive substance use, unspecified with psychoactive substance-induced psychotic disorder with delusions: Secondary | ICD-10-CM | POA: Diagnosis present

## 2022-05-18 DIAGNOSIS — F1520 Other stimulant dependence, uncomplicated: Secondary | ICD-10-CM

## 2022-05-18 DIAGNOSIS — F17200 Nicotine dependence, unspecified, uncomplicated: Secondary | ICD-10-CM | POA: Diagnosis not present

## 2022-05-18 DIAGNOSIS — F209 Schizophrenia, unspecified: Secondary | ICD-10-CM

## 2022-05-18 DIAGNOSIS — Z1152 Encounter for screening for COVID-19: Secondary | ICD-10-CM | POA: Insufficient documentation

## 2022-05-18 DIAGNOSIS — E876 Hypokalemia: Secondary | ICD-10-CM | POA: Insufficient documentation

## 2022-05-18 LAB — SALICYLATE LEVEL: Salicylate Lvl: <7 mg/dL — ABNORMAL LOW (ref 7.0–30.0)

## 2022-05-18 LAB — LITHIUM LEVEL: Lithium Lvl: 0.1 mmol/L — ABNORMAL LOW (ref 0.60–1.20)

## 2022-05-18 LAB — CBC
HCT: 43 % (ref 39.0–52.0)
Hemoglobin: 14.4 g/dL (ref 13.0–17.0)
MCH: 28.4 pg (ref 26.0–34.0)
MCHC: 33.5 g/dL (ref 30.0–36.0)
MCV: 84.8 fL (ref 80.0–100.0)
Platelets: 275 10*3/uL (ref 150–400)
RBC: 5.07 MIL/uL (ref 4.22–5.81)
RDW: 13.7 % (ref 11.5–15.5)
WBC: 9.9 10*3/uL (ref 4.0–10.5)
nRBC: 0 % (ref 0.0–0.2)

## 2022-05-18 LAB — COMPREHENSIVE METABOLIC PANEL
ALT: 18 U/L (ref 0–44)
AST: 32 U/L (ref 15–41)
Albumin: 4.1 g/dL (ref 3.5–5.0)
Alkaline Phosphatase: 70 U/L (ref 38–126)
Anion gap: 8 (ref 5–15)
BUN: 7 mg/dL (ref 6–20)
CO2: 24 mmol/L (ref 22–32)
Calcium: 9 mg/dL (ref 8.9–10.3)
Chloride: 105 mmol/L (ref 98–111)
Creatinine, Ser: 0.96 mg/dL (ref 0.61–1.24)
GFR, Estimated: >60 mL/min (ref >60)
Glucose, Bld: 127 mg/dL — ABNORMAL HIGH (ref 70–99)
Potassium: 2.9 mmol/L — ABNORMAL LOW (ref 3.5–5.1)
Sodium: 137 mmol/L (ref 135–145)
Total Bilirubin: 0.6 mg/dL (ref 0.3–1.2)
Total Protein: 7.5 g/dL (ref 6.5–8.1)

## 2022-05-18 LAB — URINE DRUG SCREEN, QUALITATIVE (ARMC ONLY)
Amphetamines, Ur Screen: NOT DETECTED
Barbiturates, Ur Screen: NOT DETECTED
Benzodiazepine, Ur Scrn: NOT DETECTED
Cannabinoid 50 Ng, Ur ~~LOC~~: NOT DETECTED
Cocaine Metabolite,Ur ~~LOC~~: POSITIVE — AB
MDMA (Ecstasy)Ur Screen: NOT DETECTED
Methadone Scn, Ur: NOT DETECTED
Opiate, Ur Screen: NOT DETECTED
Phencyclidine (PCP) Ur S: NOT DETECTED
Tricyclic, Ur Screen: NOT DETECTED

## 2022-05-18 LAB — SARS CORONAVIRUS 2 BY RT PCR: SARS Coronavirus 2 by RT PCR: NEGATIVE

## 2022-05-18 LAB — ETHANOL: Alcohol, Ethyl (B): <10 mg/dL (ref <10)

## 2022-05-18 LAB — ACETAMINOPHEN LEVEL: Acetaminophen (Tylenol), Serum: <10 ug/mL — ABNORMAL LOW (ref 10–30)

## 2022-05-18 MED ORDER — POTASSIUM CHLORIDE CRYS ER 20 MEQ PO TBCR
40.0000 meq | EXTENDED_RELEASE_TABLET | Freq: Once | ORAL | Status: AC
  Administered 2022-05-18: 40 meq via ORAL
  Filled 2022-05-18: qty 2

## 2022-05-18 MED ORDER — HALOPERIDOL 5 MG PO TABS: 5.0000 mg | ORAL_TABLET | Freq: Three times a day (TID) | ORAL | Status: DC | PRN

## 2022-05-18 MED ORDER — POTASSIUM CHLORIDE CRYS ER 20 MEQ PO TBCR: 20.0000 meq | EXTENDED_RELEASE_TABLET | Freq: Every day | ORAL | Status: DC

## 2022-05-18 MED ORDER — HALOPERIDOL LACTATE 5 MG/ML IJ SOLN
5.0000 mg | Freq: Once | INTRAMUSCULAR | Status: AC
  Administered 2022-05-18: 5 mg via INTRAMUSCULAR
  Filled 2022-05-18: qty 1

## 2022-05-18 MED ORDER — LORAZEPAM 2 MG/ML IJ SOLN: 2.0000 mg | Freq: Three times a day (TID) | INTRAMUSCULAR | Status: DC | PRN

## 2022-05-18 MED ORDER — LORAZEPAM 2 MG/ML IJ SOLN
2.0000 mg | Freq: Once | INTRAMUSCULAR | Status: AC
  Administered 2022-05-18: 2 mg via INTRAMUSCULAR
  Filled 2022-05-18: qty 1

## 2022-05-18 MED ORDER — DIPHENHYDRAMINE HCL 50 MG/ML IJ SOLN: 50.0000 mg | Freq: Three times a day (TID) | INTRAMUSCULAR | Status: DC | PRN

## 2022-05-18 MED ORDER — HALOPERIDOL LACTATE 5 MG/ML IJ SOLN: 5.0000 mg | Freq: Three times a day (TID) | INTRAMUSCULAR | Status: DC | PRN

## 2022-05-18 MED ORDER — DIPHENHYDRAMINE HCL 50 MG/ML IJ SOLN
50.0000 mg | Freq: Once | INTRAMUSCULAR | Status: AC
  Administered 2022-05-18: 50 mg via INTRAMUSCULAR
  Filled 2022-05-18: qty 1

## 2022-05-18 MED ORDER — DIPHENHYDRAMINE HCL 25 MG PO CAPS: 50.0000 mg | ORAL_CAPSULE | Freq: Three times a day (TID) | ORAL | Status: DC | PRN

## 2022-05-18 MED ORDER — LORAZEPAM 2 MG PO TABS: 2.0000 mg | ORAL_TABLET | Freq: Three times a day (TID) | ORAL | Status: DC | PRN

## 2022-05-18 NOTE — BH Assessment (Addendum)
Collateral:Haynes,Yolanda (Mother) [phone]  This writer spoke with mother for collateral. Mother reported that the pt became agitated prior to presenting to the ED. Mother reported that she dropped him off in [place] encouraging him to come to the ED for a psych eval. Mother expressed concerns that the pt has been behaving erratically and talking about God ever since pt had a recent medication adjustment. Mother explained that the pt was recently released from the hospital for chest pain. Mother reported that the pt is no longer on the [facility] Team as they'd been reported. Mother explained that the pt is starting with a new Act Team through [facility]. Mother explained that pt was hospitalized in [facility] for 3 years prior to discharging 6 months ago.

## 2022-05-18 NOTE — Consult Note (Addendum)
[facility] Face-to-Face Psychiatry Consult   Reason for Consult:  Psychiatric Evaluation  Referring Physician:  Chiquita Loth, MD Patient Identification: [name] MRN:  [id number] Principal Diagnosis: Schizophrenia, paranoid type (HCC) Diagnosis:  Principal Problem:   Schizophrenia, paranoid type (HCC) Active Problems:   Suicide ideation   Cocaine abuse (HCC)   Total Time spent with patient: 45 minutes  Subjective:  "I couldn't control myself."  [name] is a 32 y.o. male patient admitted with "SI/HI".  HPI:  [name] is a 32 y.o. male with a history significant for schizophrenia, cocaine abuse, suicidal ideation presented to the emergency department due to suicidal ideation, paranoia, and aggressive behavior, necessitating intramuscular injections due to imminent risk of danger to self or others.  The patient was subsequently placed under involuntary commitment (IVC) by EDP.  Patient seen and chart reviewed.  Case reviewed with Dr. Larinda Buttery, EDP.  The patient expresses a lack of control over his actions and feelings of immense power when angry, describing it as having ability to "blow up the world."  He reports that his current medication regimen is ineffective, though he is unable to specify the medications by name, stating only that he last took them yesterday.  He describes experiencing vivid nightmares and difficulty sleeping, which contribute to his distress.  He reports a history of a suicide attempt in 2015, where he jumped off a bridge.  He attributes his ongoing stress and mental health issues to his upbringing.  Currently, he denies suicidal homicidal ideation but reports complex auditory and visual hallucinations, including hearing directives from the television and seeing spirits and birds.  He feels that his oral medications are not as strong as the injections he received in the emergency department, which he found to be effective.  He has been on various psychiatric  medications since the age of 5 but expresses ongoing difficulty in managing his emotions.  Regarding substance use, he admits to using cocaine "every now and then," with the last use reported as yesterday, and occasional alcohol consumption, specifically mentioning that he had 1 shot of Patron yesterday. During the evaluation he is lying in the bed, calm and cooperative. Speech is tangential. Affect is blunt. He does not appear to be responding to stimuli during the encounter. Thought content delusional. UDS + cocaine. Ethyl alcohol unremarkable.      Per Garlan Fillers, LCAS: Collateral:Haynes,[name] (Mother) [phone]  This writer spoke with mother for collateral. Mother reported that the pt became agitated prior to presenting to the ED. Mother reported that she dropped him off in [place] encouraging him to come to the ED for a psych eval. Mother expressed concerns that the pt has been behaving erratically and talking about God ever since pt had a recent medication adjustment. Mother explained that the pt was recently released from the hospital for chest pain. Mother reported that the pt is no longer on the [facility] as they'd been reported. Mother explained that the pt is starting with a new Act Team through [place]. Mother explained that pt was hospitalized in [facility] for 3 years prior to discharging 6 months ago.   Past Psychiatric History: Schizoaffective   Risk to Self:   Risk to Others:  No  Prior Inpatient Therapy:  [facility] x 3 years Prior Outpatient Therapy:    Past Medical History:  Past Medical History:  Diagnosis Date   Schizo affective schizophrenia (HCC)    Schizophrenia (HCC)    History  reviewed. No pertinent surgical history. Family History: History reviewed. No pertinent family history. Family Psychiatric  History: No pertinent family psychiatric history on file.  Social History:  Social History   Substance and Sexual Activity   Alcohol Use Not Currently     Social History   Substance and Sexual Activity  Drug Use Yes   Types: Cocaine, Marijuana   Comment: crack 06-26-19    Social History   Socioeconomic History   Marital status: Single    Spouse name: Not on file   Number of children: Not on file   Years of education: Not on file   Highest education level: Not on file  Occupational History   Not on file  Tobacco Use   Smoking status: Every Day   Smokeless tobacco: Never  Substance and Sexual Activity   Alcohol use: Not Currently   Drug use: Yes    Types: Cocaine, Marijuana    Comment: crack 06-26-19   Sexual activity: Not on file  Other Topics Concern   Not on file  Social History Narrative   Not on file   Social Determinants of Health   Financial Resource Strain: Not on file  Food Insecurity: Not on file  Transportation Needs: Not on file  Physical Activity: Not on file  Stress: Not on file  Social Connections: Not on file   Additional Social History:    Allergies:   Allergies  Allergen Reactions   Ibuprofen Swelling    Tongue swelling   Risperidone And Related Other (See Comments) and Swelling    gynecomastia gynecomastia    Ziprasidone Swelling    Tongue swells   Benztropine Other (See Comments)    Causes confusion, depression, and delusions   Quetiapine Other (See Comments)    Depression, suicidality, adverse effect: seizures     Labs:  Results for orders placed or performed during the hospital encounter of 05/18/22 (from the past 48 hour(s))  Comprehensive metabolic panel     Status: Abnormal   Collection Time: 05/18/22  1:07 AM  Result Value Ref Range   Sodium 137 135 - 145 mmol/L   Potassium 2.9 (L) 3.5 - 5.1 mmol/L   Chloride 105 98 - 111 mmol/L   CO2 24 22 - 32 mmol/L   Glucose, Bld 127 (H) 70 - 99 mg/dL    Comment: Glucose reference range applies only to samples taken after fasting for at least 8 hours.   BUN 7 6 - 20 mg/dL   Creatinine, Ser 1.61 0.61 -  1.24 mg/dL   Calcium 9.0 8.9 - 09.6 mg/dL   Total Protein 7.5 6.5 - 8.1 g/dL   Albumin 4.1 3.5 - 5.0 g/dL   AST 32 15 - 41 U/L   ALT 18 0 - 44 U/L   Alkaline Phosphatase 70 38 - 126 U/L   Total Bilirubin 0.6 0.3 - 1.2 mg/dL   GFR, Estimated >04 >54 mL/min    Comment: (NOTE) Calculated using the CKD-EPI Creatinine Equation (2021)    Anion gap 8 5 - 15    Comment: Performed at [facility], [address] [place], [place] [address]  Ethanol     Status: None   Collection Time: 05/18/22  1:07 AM  Result Value Ref Range   Alcohol, Ethyl (B) <10 <10 mg/dL    Comment: (NOTE) Lowest detectable limit for serum alcohol is 10 mg/dL.  For medical purposes only. Performed at [facility], [address] [place], [place] [address]  Salicylate level     Status: Abnormal   Collection Time: 05/18/22  1:07 AM  Result Value Ref Range   Salicylate Lvl <7.0 (L) 7.0 - 30.0 mg/dL    Comment: Performed at [facility], [address] [place], [place] [address]  Acetaminophen level     Status: Abnormal   Collection Time: 05/18/22  1:07 AM  Result Value Ref Range   Acetaminophen (Tylenol), Serum <10 (L) 10 - 30 ug/mL    Comment: (NOTE) Therapeutic concentrations vary significantly. A range of 10-30 ug/mL  may be an effective concentration for many patients. However, some  are best treated at concentrations outside of this range. Acetaminophen concentrations >150 ug/mL at 4 hours after ingestion  and >50 ug/mL at 12 hours after ingestion are often associated with  toxic reactions.  Performed at [facility], [address] [place], [place] [address]   cbc     Status: None   Collection Time: 05/18/22  1:07 AM  Result Value Ref Range   WBC 9.9 4.0 - 10.5 K/uL   RBC 5.07 4.22 - 5.81 MIL/uL   Hemoglobin 14.4 13.0 - 17.0 g/dL   HCT 09.8 11.9 - 14.7 %   MCV 84.8 80.0 - 100.0 fL   MCH 28.4 26.0 - 34.0 pg   MCHC 33.5 30.0 - 36.0 g/dL   RDW 82.9 56.2 -  13.0 %   Platelets 275 150 - 400 K/uL   nRBC 0.0 0.0 - 0.2 %    Comment: Performed at [facility], [address] [place], [place] [address]    Current Facility-Administered Medications  Medication Dose Route Frequency Provider Last Rate Last Admin   diphenhydrAMINE (BENADRYL) capsule 50 mg  50 mg Oral TID PRN Dru Laurel H, NP       Or   diphenhydrAMINE (BENADRYL) injection 50 mg  50 mg Intramuscular TID PRN Janayla Marik H, NP       haloperidol (HALDOL) tablet 5 mg  5 mg Oral TID PRN Justus Droke H, NP       Or   haloperidol lactate (HALDOL) injection 5 mg  5 mg Intramuscular TID PRN Sandro Burgo H, NP       LORazepam (ATIVAN) tablet 2 mg  2 mg Oral TID PRN Liahna Brickner H, NP       Or   LORazepam (ATIVAN) injection 2 mg  2 mg Intramuscular TID PRN Lenis Nettleton H, NP       Current Outpatient Medications  Medication Sig Dispense Refill   Cholecalciferol (VITAMIN D3) 50 MCG (2000 UT) TABS Take 1 tablet by mouth daily.     clozapine (CLOZARIL) 200 MG tablet Take 400 mg by mouth at bedtime.     ELIQUIS 5 MG TABS tablet Take 5 mg by mouth 2 (two) times daily.     lithium 600 MG capsule Take 1,200 mg by mouth at bedtime.     metFORMIN (GLUCOPHAGE-XR) 500 MG 24 hr tablet Take 1,000 mg by mouth at bedtime.     metoprolol tartrate (LOPRESSOR) 25 MG tablet Take 37.5 mg by mouth 2 (two) times daily.     nicotine polacrilex (COMMIT) 2 MG lozenge Take 2 mg by mouth as needed.     OLANZapine (ZYPREXA) 10 MG tablet Take 10 mg by mouth at bedtime.     senna (SENOKOT) 8.6 MG TABS tablet Take 2 tablets by mouth at bedtime.     ARIPiprazole (ABILIFY) 15 MG tablet Take 1 tablet (15 mg  total) by mouth daily. (Patient not taking: Reported on 05/18/2022) 30 tablet 1   cloZAPine (CLOZARIL) 25 MG tablet Take 50 mg by mouth at bedtime. (Patient not taking: Reported on 05/18/2022)     divalproex (DEPAKOTE) 500 MG DR tablet Take 1 tablet (500 mg total) by mouth every  morning. (Patient not taking: Reported on 05/18/2022) 30 tablet 1   ZYPREXA RELPREVV 405 MG SUSR Inject into the muscle. (Patient not taking: Reported on 05/18/2022)      Musculoskeletal: Strength & Muscle Tone: within normal limits Gait & Station:  Did not assess  Patient leans: N/A            Psychiatric Specialty Exam:  Presentation  General Appearance:  Appropriate for Environment  Eye Contact: Good  Speech: Clear and Coherent  Speech Volume: Normal  Handedness: Right   Mood and Affect  Mood: Euthymic  Affect: Congruent   Thought Process  Thought Processes: Coherent  Descriptions of Associations:Intact  Orientation:Full (Time, Place and Person)  Thought Content:Logical  History of Schizophrenia/Schizoaffective disorder:No data recorded Duration of Psychotic Symptoms:No data recorded Hallucinations:No data recorded Ideas of Reference:None  Suicidal Thoughts:No data recorded Homicidal Thoughts:No data recorded  Sensorium  Memory: Immediate Good; Recent Good; Remote Good  Judgment: Fair  Insight: Fair   Art therapist  Concentration: Good  Attention Span: Good  Recall: Good  Fund of Knowledge: Good  Language: Good   Psychomotor Activity  Psychomotor Activity:No data recorded  Assets  Assets: Communication Skills; Desire for Improvement; Financial Resources/Insurance; Housing; Leisure Time; Resilience; Social Support   Sleep  Sleep:No data recorded  Physical Exam: Physical Exam Vitals and nursing note reviewed.  HENT:     Head: Normocephalic.     Nose: Nose normal.  Pulmonary:     Effort: Pulmonary effort is normal.  Musculoskeletal:        General: Normal range of motion.     Cervical back: Normal range of motion.  Neurological:     Mental Status: He is alert and oriented to person, place, and time.  Psychiatric:        Attention and Perception: He perceives auditory and visual hallucinations.         Mood and Affect: Affect is blunt.        Speech: Speech is tangential.        Behavior: Behavior is cooperative.        Thought Content: Thought content is delusional. Thought content is not paranoid. Thought content does not include homicidal or suicidal ideation.        Cognition and Memory: Cognition and memory normal.        Judgment: Judgment is impulsive.    ROS Blood pressure 115/65, pulse 89, temperature 97.9 F (36.6 C), resp. rate 16, height 5\' 11"  (1.803 m), weight 81.6 kg, SpO2 100 %. Body mass index is 25.1 kg/m.  Treatment Plan Summary: Daily contact with patient to assess and evaluate symptoms and progress in treatment, Medication management, and Plan Inpatient psychiatric hospitalization recommended for stabilization. Case reviewed with Dr. Larinda Buttery, EDP  PLAN:  Lithium level lab draw Clozapine lab draw  Initiate Trazodone 100 mg OHS PRN   Initiate Imminent risk of danger to self or others protocol: Haloperidol 5 mg PO or IM TID PRN Ativan 2 mg PO or IM TID PRN Dipehnhydramine 50 mg PO or IM TID PRN  Disposition:  Recommend psychiatric Inpatient admission when medically cleared. Supportive therapy provided about ongoing stressors.  Norma Fredrickson, NP 05/18/2022  1:14 PM

## 2022-05-18 NOTE — ED Notes (Signed)
Pt taking shower. Pt was given hygiene items and the following, 1 clean top, 1 clean bottom, with 1 pair of disposable underwear.  Pt changed out into clean clothing.  Staff disposed of all shower supplies.   
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
## 2022-05-18 NOTE — ED Notes (Signed)
Patient was given a snack at this time.  

## 2022-05-18 NOTE — BH Assessment (Signed)
Adult MH  Referral information for Psychiatric Hospitalization faxed to:   [facility] ([phone]-or- [phone]),   [facility] ([phone]),   [facility] ([phone] -or- [phone]),   [place] ([phone]---[phone]---[phone]),   [place] [facility] ([phone] or [phone])   [place] ([phone] or [phone]),   [facility] ([phone]) 

## 2022-05-18 NOTE — ED Notes (Signed)
Psych NP and TTS at the bedside for pt evaluation

## 2022-05-18 NOTE — BH Assessment (Signed)
Pt tentatively accepted to [facility] due to abnormal lab results and pending results. Daytime TTS to follow up on pt's acceptance once results are faxed.
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
## 2022-05-18 NOTE — BH Assessment (Addendum)
Patient has been accepted to [facility].  Patient assigned to 3 [facility] Accepting physician is Dr. Virgina Norfolk.  Call report to [phone].  Representative was Riley Churches   ER Staff is aware of it:  Amarni, ER Secretary  Dr. Erma Heritage, ER MD  Amy, Patient's Nurse     Patient can arrive at 10 AM 05/19/22.   Pt pending Covid and Blood Pressure results.

## 2022-05-18 NOTE — ED Notes (Signed)
This tech obtained vital signs and gave pt a snack and beverage.

## 2022-05-18 NOTE — ED Notes (Signed)
IVC/Consult Completed/Recommend Inpt Admit

## 2022-05-18 NOTE — ED Notes (Addendum)
Patient in room being aruging with security. MD aware and medications ordered.
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
## 2022-05-18 NOTE — ED Notes (Signed)
Patient took medications without incident.

## 2022-05-18 NOTE — ED Notes (Signed)
Pt sleeping, will obtain vital signs when pt wakes up.  

## 2022-05-18 NOTE — ED Notes (Signed)
Pt provided with breakfast tray and milk in exam room

## 2022-05-18 NOTE — ED Notes (Signed)
Pt dressed out into burgundy BH scrubs. Pt items placed into belongings bag and labeled with pt demographics.   Items include: 1 gray tshirt 1 pair black pants 1 pair shoes 1 pair socks 1 pair underwear 1 black watch 1 cellphone

## 2022-05-19 DIAGNOSIS — F200 Paranoid schizophrenia: Secondary | ICD-10-CM | POA: Diagnosis not present

## 2022-05-19 LAB — BASIC METABOLIC PANEL
Anion gap: 7 (ref 5–15)
BUN: 9 mg/dL (ref 6–20)
CO2: 23 mmol/L (ref 22–32)
Calcium: 8.5 mg/dL — ABNORMAL LOW (ref 8.9–10.3)
Chloride: 110 mmol/L (ref 98–111)
Creatinine, Ser: 0.81 mg/dL (ref 0.61–1.24)
GFR, Estimated: >60 mL/min (ref >60)
Glucose, Bld: 105 mg/dL — ABNORMAL HIGH (ref 70–99)
Potassium: 3.7 mmol/L (ref 3.5–5.1)
Sodium: 140 mmol/L (ref 135–145)

## 2022-05-19 MED ORDER — HYDROXYZINE HCL 25 MG PO TABS
25.0000 mg | ORAL_TABLET | Freq: Once | ORAL | Status: AC
  Administered 2022-05-19: 25 mg via ORAL

## 2022-05-19 NOTE — ED Notes (Signed)
Pt mom notified of pt admission

## 2022-05-19 NOTE — ED Notes (Signed)
ivc/Pt tentatively accepted to [facility] due to abnormal lab results and pending results. Daytime TTS to follow up on pt's acceptance once results are faxed . Pt under review.Marland Kitchen

## 2022-05-19 NOTE — BH Assessment (Signed)
Requested documents (Updated Labs and IVC paperwork) faxed to [facility]

## 2022-05-19 NOTE — ED Provider Notes (Signed)
Emergency Medicine Observation Re-evaluation Note  [name] is a 32 y.o. male, seen on rounds today.  Pt initially presented to the ED for complaints of Psychiatric Evaluation Currently, the patient is resting comfortably in bed.  Physical Exam  BP 113/77 (BP Location: Left Arm)   Pulse 77   Temp 98.1 F (36.7 C) (Oral)   Resp 20   Ht 5\' 11"  (1.803 m)   Wt 81.6 kg   SpO2 100%   BMI 25.10 kg/m  Physical Exam General: Resting in bed, no reported overnight issues by nursing team  ED Course / MDM   No reported acute events over past 24 hours.  Plan  Current plan is for placement. Per chart review possible placement at [facility].     Trinna Post, MD 05/19/22 [phone]

## 2022-05-19 NOTE — ED Notes (Signed)
Pt having to be awoken x2 from sleep for blood draws. Pt very calm and cooperative at this time, asking for medication to help him fall back asleep. NP messaged and orders received- see MAR

## 2022-05-19 NOTE — ED Notes (Signed)
Called [facility] for transport.  

## 2022-05-19 NOTE — ED Notes (Signed)
NT at bedside obtaining blood work- pt tolerated well. Pt voices no complaints at this time.

## 2022-05-21 LAB — CLOZAPINE (CLOZARIL)
Clozapine Lvl: <20 ng/mL — ABNORMAL LOW (ref 350–600)
NorClozapine: <20 ng/mL
Total(Cloz+Norcloz): <40 ng/mL

## 2022-11-07 ENCOUNTER — Emergency Department
Admission: EM | Admit: 2022-11-07 | Discharge: 2022-11-08 | Disposition: A | Discharge status: Home / Self Care | Payer: Medicare Other | Attending: Emergency Medicine | Admitting: Emergency Medicine

## 2022-11-07 ENCOUNTER — Other Ambulatory Visit: Payer: Self-pay

## 2022-11-07 DIAGNOSIS — R45851 Suicidal ideations: Secondary | ICD-10-CM | POA: Insufficient documentation

## 2022-11-07 DIAGNOSIS — R4689 Other symptoms and signs involving appearance and behavior: Secondary | ICD-10-CM | POA: Diagnosis present

## 2022-11-07 DIAGNOSIS — F259 Schizoaffective disorder, unspecified: Secondary | ICD-10-CM | POA: Insufficient documentation

## 2022-11-07 LAB — CBC
HCT: 41.6 % (ref 39.0–52.0)
Hemoglobin: 13.9 g/dL (ref 13.0–17.0)
MCH: 28.7 pg (ref 26.0–34.0)
MCHC: 33.4 g/dL (ref 30.0–36.0)
MCV: 85.8 fL (ref 80.0–100.0)
Platelets: 321 10*3/uL (ref 150–400)
RBC: 4.85 MIL/uL (ref 4.22–5.81)
RDW: 13.9 % (ref 11.5–15.5)
WBC: 10.3 10*3/uL (ref 4.0–10.5)
nRBC: 0 % (ref 0.0–0.2)

## 2022-11-07 LAB — URINE DRUG SCREEN, QUALITATIVE (ARMC ONLY)
Amphetamines, Ur Screen: NOT DETECTED
Barbiturates, Ur Screen: NOT DETECTED
Benzodiazepine, Ur Scrn: NOT DETECTED
Cannabinoid 50 Ng, Ur ~~LOC~~: POSITIVE — AB
Cocaine Metabolite,Ur ~~LOC~~: NOT DETECTED
MDMA (Ecstasy)Ur Screen: NOT DETECTED
Methadone Scn, Ur: NOT DETECTED
Opiate, Ur Screen: NOT DETECTED
Phencyclidine (PCP) Ur S: NOT DETECTED
Tricyclic, Ur Screen: NOT DETECTED

## 2022-11-07 LAB — COMPREHENSIVE METABOLIC PANEL
ALT: 25 U/L (ref 0–44)
AST: 29 U/L (ref 15–41)
Albumin: 4.2 g/dL (ref 3.5–5.0)
Alkaline Phosphatase: 57 U/L (ref 38–126)
Anion gap: 10 (ref 5–15)
BUN: 8 mg/dL (ref 6–20)
CO2: 23 mmol/L (ref 22–32)
Calcium: 9.2 mg/dL (ref 8.9–10.3)
Chloride: 107 mmol/L (ref 98–111)
Creatinine, Ser: 0.95 mg/dL (ref 0.61–1.24)
GFR, Estimated: >60 mL/min (ref >60)
Glucose, Bld: 127 mg/dL — ABNORMAL HIGH (ref 70–99)
Potassium: 3.7 mmol/L (ref 3.5–5.1)
Sodium: 140 mmol/L (ref 135–145)
Total Bilirubin: 0.7 mg/dL (ref 0.3–1.2)
Total Protein: 7.8 g/dL (ref 6.5–8.1)

## 2022-11-07 LAB — ETHANOL: Alcohol, Ethyl (B): <10 mg/dL (ref <10)

## 2022-11-07 LAB — SALICYLATE LEVEL: Salicylate Lvl: <7 mg/dL — ABNORMAL LOW (ref 7.0–30.0)

## 2022-11-07 LAB — ACETAMINOPHEN LEVEL: Acetaminophen (Tylenol), Serum: <10 ug/mL — ABNORMAL LOW (ref 10–30)

## 2022-11-07 NOTE — BH Assessment (Signed)
Comprehensive Clinical Assessment (CCA) Screening, Triage and Referral Note  11/07/2022 [name] [id number]  Chief Complaint:  Chief Complaint  Patient presents with   Psychiatric Evaluation   Visit Diagnosis: Schizophrenia  [name] is a 32 year old male who presents to the ER initially due to voicing SI. While in the ER, he spoke with his sister and states he is no longer having SI. When asked what changed, he shared, he was probably high from cannabis and no longer feel the same now. He acknowledged he is not living in the home with his mother. After speaking with his sister, he's no longer feeling hopeless.  Per the report of the patient's sister ([name]), the patient came to the ER because he was upset due to their mother changing her mind about him living in her home. She further explained, the mother has mental health concerns and is unstable. After she put the patient out of the home, he became upset said. However, she has secure housing for him and informed him via telephone.   Patient Reported Information How did you hear about [place]? Self  What Is the Reason for Your Visit/Call Today? Initially stated he was having thoughts of ending life, but now reports he is doing better.  How Long Has This Been Causing You Problems? 1 wk - 1 month  What Do You Feel Would Help You the Most Today? Treatment for Depression or other mood problem; Alcohol or Drug Use Treatment   Have You Recently Had Any Thoughts About Hurting Yourself? Yes  Are You Planning to Commit Suicide/Harm Yourself At This time? No   Have you Recently Had Thoughts About Hurting Someone [name]? No  Are You Planning to Harm Someone at This Time? No  Explanation: No data recorded  Have You Used Any Alcohol or Drugs in the Past 24 Hours? Yes  How Long Ago Did You Use Drugs or Alcohol? No data recorded What Did You Use and How Much? Cannabis   Do You Currently Have a  Therapist/Psychiatrist? No  Name of Therapist/Psychiatrist: No data recorded  Have You Been Recently Discharged From Any Office Practice or Programs? No  Explanation of Discharge From Practice/Program: No data recorded   CCA Screening Triage Referral Assessment Type of Contact: Face-to-Face  Telemedicine Service Delivery:   Is this Initial or Reassessment?   Date Telepsych consult ordered in CHL:    Time Telepsych consult ordered in CHL:    Location of Assessment: [facility] ED  Provider Location: [facility] ED    Collateral Involvement: Mom   Does Patient Have a Automotive engineer Guardian? No data recorded Name and Contact of Legal Guardian: No data recorded If Minor and Not Living with Parent(s), Who has Custody? No data recorded Is CPS involved or ever been involved? No data recorded Is APS involved or ever been involved? No data recorded  Patient Determined To Be At Risk for Harm To Self or Others Based on Review of Patient Reported Information or Presenting Complaint? No  Method: Plan without intent  Availability of Means: No data recorded Intent: Vague intent or NA  Notification Required: No data recorded Additional Information for Danger to Others Potential: No data recorded Additional Comments for Danger to Others Potential: No data recorded Are There Guns or Other Weapons in Your Home? No data recorded Types of Guns/Weapons: No data recorded Are These Weapons Safely Secured?  No data recorded Who Could Verify You Are Able To Have These Secured: No data recorded Do You Have any Outstanding Charges, Pending Court Dates, Parole/Probation? No data recorded Contacted To Inform of Risk of Harm To Self or Others: No data recorded  Does Patient Present under Involuntary Commitment? No    County of Residence: [place]   Patient Currently Receiving the Following Services: Not Receiving Services   Determination of Need: Emergent (2  hours)   Options For Referral: ED Visit   Discharge Disposition:     Lilyan Gilford MS, LCAS, [facility], [facility] Therapeutic Triage Specialist 11/07/2022 4:12 PM

## 2022-11-07 NOTE — ED Provider Notes (Signed)
[facility] Provider Note    Event Date/Time   First MD Initiated Contact with Patient 11/07/22 [phone]     (approximate)   History   Psychiatric Evaluation   HPI HOSEA FULK is a 32 y.o. male with history of schizoaffective disorder, polysubstance abuse presenting today for suicidal ideation.  Patient states worsening symptoms over the past couple days.  Denies any specific plan and states that he wants to kill himself.  Reports auditory hallucinations but no visual hallucinations.  Denies HI.  History of suicidal ideation and depression.  Not currently on any medications.     Physical Exam   Triage Vital Signs: ED Triage Vitals  Encounter Vitals Group     BP 11/07/22 0528 117/86     Systolic BP Percentile --      Diastolic BP Percentile --      Pulse Rate 11/07/22 0528 (!) 124     Resp 11/07/22 0528 18     Temp 11/07/22 0528 98.8 F (37.1 C)     Temp src --      SpO2 11/07/22 0528 98 %     Weight --      Height --      Head Circumference --      Peak Flow --      Pain Score 11/07/22 0526 0     Pain Loc --      Pain Education --      Exclude from Growth Chart --     Most recent vital signs: Vitals:   11/07/22 0528  BP: 117/86  Pulse: (!) 124  Resp: 18  Temp: 98.8 F (37.1 C)  SpO2: 98%   I have reviewed the vital signs. General:  Awake, alert, no acute distress. Head:  Normocephalic, Atraumatic. EENT:  PERRL, EOMI, Oral mucosa pink and moist, Neck is supple. Cardiovascular: Regular rate, 2+ distal pulses. Respiratory:  Normal respiratory effort, symmetrical expansion, no distress.   Extremities:  Moving all four extremities through full ROM without pain.   Neuro:  Alert and oriented.  Interacting appropriately.   Skin:  Warm, dry, no rash.   Psych: Appropriate affect.  Endorses SI   ED Results / Procedures / Treatments   Labs (all labs ordered are listed, but only abnormal results are displayed) Labs Reviewed   COMPREHENSIVE METABOLIC PANEL - Abnormal; Notable for the following components:      Result Value   Glucose, Bld 127 (*)    All other components within normal limits  SALICYLATE LEVEL - Abnormal; Notable for the following components:   Salicylate Lvl <7.0 (*)    All other components within normal limits  ACETAMINOPHEN LEVEL - Abnormal; Notable for the following components:   Acetaminophen (Tylenol), Serum <10 (*)    All other components within normal limits  ETHANOL  CBC  URINE DRUG SCREEN, QUALITATIVE ([facility] ONLY)     EKG    RADIOLOGY    PROCEDURES:  Critical Care performed: No  Procedures   MEDICATIONS ORDERED IN ED: Medications - No data to display   IMPRESSION / MDM / ASSESSMENT AND PLAN / ED COURSE  I reviewed the triage vital signs and the nursing notes.                              Differential diagnosis includes, but is not limited to, suicidal ideation, worsening schizophrenia  Patient's presentation is most consistent with acute presentation  with potential threat to life or bodily function.  Patient is a 32 year old male presenting today for suicidal ideation.  Medically stable with no complaints.  Cleared for psychiatric assessment.  Patient signed out pending psychiatric assessment.  Patient is currently voluntary.  The patient has been placed in psychiatric observation due to the need to provide a safe environment for the patient while obtaining psychiatric consultation and evaluation, as well as ongoing medical and medication management to treat the patient's condition.  The patient has not been placed under full IVC at this time.      FINAL CLINICAL IMPRESSION(S) / ED DIAGNOSES   Final diagnoses:  Suicidal ideation     Rx / DC Orders   ED Discharge Orders     None        Note:  This document was prepared using Dragon voice recognition software and may include unintentional dictation errors.   Janith Lima, MD 11/07/22 [phone]

## 2022-11-07 NOTE — ED Notes (Signed)
Pt provided with dinner tray. Pt sitting up eating

## 2022-11-07 NOTE — Consult Note (Signed)
There are no current barriers to discharge. The patient reports that his suicidal ideations were primarily related to housing instability and estrangement from his family. Given these factors, I do not believe he will benefit from inpatient psychiatric hospitalization at this time. There is a strong indication of secondary gain related to housing. The patient is actively working with his sister to secure housing, which is expected to mitigate his suicide risk.  He appears future-oriented, motivated, and no longer reports suicidal ideation after speaking with his loved one. He does not present an imminent danger to himself or others and is suitable for discharge from the ED. His suicidal ideations were driven by the need for housing, reflecting secondary gain rather than acute mental health deterioration.  The patient is aware of emergency resources and understands the importance of seeking help if his mood changes or suicidal thoughts re-emerge. If he continues to contract for safety, discharge from the ED is appropriate.  Psychiatry to sign off. TTS to follow from a distance. EDP to reassess and may discharge once housing secured. Patient need bus pass.

## 2022-11-07 NOTE — BH Assessment (Signed)
Psych Team unable to complete consult at this time. Patient is unable to participate at this time.

## 2022-11-07 NOTE — ED Notes (Signed)
Breakfast tray given. °

## 2022-11-07 NOTE — ED Notes (Signed)
RN entered room to introduce herself to pt, pt is sleeping did not wake up

## 2022-11-07 NOTE — ED Triage Notes (Signed)
Pt reports suicidal ideation and continues to say "I wanna kill myself." When asked about a specific plan pt states "I just wanna make everybody 100." Pt unable to verbalized specific plan. Pt reports auditory hallucinations. Pt with rapid speech in triage. Pt reports getting out of jail yesterday.

## 2022-11-07 NOTE — ED Notes (Signed)
VOL/pending psych consult 

## 2022-11-07 NOTE — ED Notes (Addendum)
Moved pt room 23 to 19H, pt asked for soemthing to drink, provided pt with a cup with ginger ale No other needs at present

## 2022-11-07 NOTE — ED Notes (Signed)
[name] (sister) [phone]

## 2022-11-07 NOTE — ED Notes (Signed)
Pt changed into scrubs and pt belongings placed in two bags: black cellphone, black tshirt, white sweater, grocery bag with papers, minute maid juice, white thermal, red lighter, black watch, silver color ring, blue credit card, phone charger, black [place] tennis shoes, black socks, blue shorts, plaid underwear.

## 2022-11-07 NOTE — ED Notes (Signed)
Pt's sister, [name] - [phone], came to ED for a visit, staff spoke with sister and informed her that pt arrived very early this a.m. and was sleeping.  Sister stated that pt's mother had invited patient to her home as he was just released from jail.  When [name] arrived in [place] the mothers home was locked.  Pt's sister picked up [name] and d/t his current mental state she felt she needed to bring him to the ED for further eval. Sister asked that ED staff notify her when pt ready for discharge, she stated that she will be picking him up, TTS notified

## 2022-11-08 NOTE — ED Notes (Signed)
Called sister for his ride home. No answer. D/C ride request Message left.

## 2022-11-08 NOTE — ED Notes (Signed)
Patient sleeping, no acute distress noted,  will wake and talk to staff, calm cooperative

## 2022-11-08 NOTE — Discharge Instructions (Signed)
You have been seen in the Emergency Department (ED) today for a psychiatric complaint.  You have been evaluated by psychiatry and we believe you are safe to be discharged from the hospital.   ° °Please return to the ED immediately if you have ANY thoughts of hurting yourself or anyone else, so that we may help you. ° °Please avoid alcohol and drug use. ° °Follow up with your doctor and/or therapist as soon as possible regarding today's ED visit.   Please follow up any other recommendations and clinic appointments provided by the psychiatry team that saw you in the Emergency Department. ° °

## 2022-11-08 NOTE — ED Notes (Signed)
Pt ambulatory to restroom

## 2022-11-08 NOTE — ED Provider Notes (Signed)
Emergency Medicine Observation Re-evaluation Note  [name] is a 32 y.o. male, seen on rounds today.  Pt initially presented to the ED for complaints of Psychiatric Evaluation Currently, the patient is resting.  Physical Exam  BP 108/63 (BP Location: Right Arm)   Pulse 70   Temp 98.1 F (36.7 C) (Oral)   Resp 18   SpO2 98%  Physical Exam Gen:  No acute distress Resp:  Breathing easily and comfortably, no accessory muscle usage Neuro:  Moving all four extremities, no gross focal neuro deficits Psych:  Resting currently, calm when awake  ED Course / MDM  EKG:   I have reviewed the labs performed to date as well as medications administered while in observation.  Recent changes in the last 24 hours include EDP and psychiatric evaluation.  Plan  Current plan is for discharge home.  Patient has been cleared and was ready to go home but there was some sort of miscommunication and no one could come pick him up.  He has been stable overnight and is ready for someone (presumably family) to come pick him up and take him back to his home in the morning.    Loleta Rose, MD 11/08/22 [phone]

## 2022-11-08 NOTE — ED Notes (Addendum)
Attempted calling sister as requested. Pt will be d/c to the lobby to wait for his ride. Message left

## 2022-12-01 ENCOUNTER — Other Ambulatory Visit: Payer: Self-pay

## 2022-12-01 ENCOUNTER — Emergency Department
Admission: EM | Admit: 2022-12-01 | Discharge: 2022-12-03 | Disposition: A | Discharge status: Other Acute Inpatient Hospital | Payer: Medicare Other | Attending: Emergency Medicine | Admitting: Emergency Medicine

## 2022-12-01 DIAGNOSIS — F1210 Cannabis abuse, uncomplicated: Secondary | ICD-10-CM | POA: Diagnosis present

## 2022-12-01 DIAGNOSIS — F17200 Nicotine dependence, unspecified, uncomplicated: Secondary | ICD-10-CM | POA: Insufficient documentation

## 2022-12-01 DIAGNOSIS — F200 Paranoid schizophrenia: Secondary | ICD-10-CM | POA: Diagnosis not present

## 2022-12-01 DIAGNOSIS — F99 Mental disorder, not otherwise specified: Secondary | ICD-10-CM | POA: Diagnosis not present

## 2022-12-01 DIAGNOSIS — F1520 Other stimulant dependence, uncomplicated: Secondary | ICD-10-CM | POA: Diagnosis present

## 2022-12-01 DIAGNOSIS — F1410 Cocaine abuse, uncomplicated: Secondary | ICD-10-CM | POA: Diagnosis present

## 2022-12-01 DIAGNOSIS — R45851 Suicidal ideations: Secondary | ICD-10-CM

## 2022-12-01 DIAGNOSIS — F259 Schizoaffective disorder, unspecified: Secondary | ICD-10-CM | POA: Diagnosis present

## 2022-12-01 DIAGNOSIS — F1290 Cannabis use, unspecified, uncomplicated: Secondary | ICD-10-CM | POA: Diagnosis present

## 2022-12-01 DIAGNOSIS — F19950 Other psychoactive substance use, unspecified with psychoactive substance-induced psychotic disorder with delusions: Secondary | ICD-10-CM | POA: Diagnosis present

## 2022-12-01 LAB — COMPREHENSIVE METABOLIC PANEL
ALT: 27 U/L (ref 0–44)
AST: 24 U/L (ref 15–41)
Albumin: 4 g/dL (ref 3.5–5.0)
Alkaline Phosphatase: 55 U/L (ref 38–126)
Anion gap: 13 (ref 5–15)
BUN: 12 mg/dL (ref 6–20)
CO2: 24 mmol/L (ref 22–32)
Calcium: 9.1 mg/dL (ref 8.9–10.3)
Chloride: 102 mmol/L (ref 98–111)
Creatinine, Ser: 0.93 mg/dL (ref 0.61–1.24)
GFR, Estimated: >60 mL/min (ref >60)
Glucose, Bld: 104 mg/dL — ABNORMAL HIGH (ref 70–99)
Potassium: 3.2 mmol/L — ABNORMAL LOW (ref 3.5–5.1)
Sodium: 139 mmol/L (ref 135–145)
Total Bilirubin: 0.8 mg/dL (ref <1.2)
Total Protein: 7.7 g/dL (ref 6.5–8.1)

## 2022-12-01 LAB — CBC
HCT: 46.4 % (ref 39.0–52.0)
Hemoglobin: 15.4 g/dL (ref 13.0–17.0)
MCH: 28.6 pg (ref 26.0–34.0)
MCHC: 33.2 g/dL (ref 30.0–36.0)
MCV: 86.1 fL (ref 80.0–100.0)
Platelets: 198 10*3/uL (ref 150–400)
RBC: 5.39 MIL/uL (ref 4.22–5.81)
RDW: 13 % (ref 11.5–15.5)
WBC: 5.3 10*3/uL (ref 4.0–10.5)
nRBC: 0 % (ref 0.0–0.2)

## 2022-12-01 LAB — URINE DRUG SCREEN, QUALITATIVE (ARMC ONLY)
Amphetamines, Ur Screen: NOT DETECTED
Barbiturates, Ur Screen: NOT DETECTED
Benzodiazepine, Ur Scrn: NOT DETECTED
Cannabinoid 50 Ng, Ur ~~LOC~~: POSITIVE — AB
Cocaine Metabolite,Ur ~~LOC~~: POSITIVE — AB
MDMA (Ecstasy)Ur Screen: NOT DETECTED
Methadone Scn, Ur: NOT DETECTED
Opiate, Ur Screen: NOT DETECTED
Phencyclidine (PCP) Ur S: NOT DETECTED
Tricyclic, Ur Screen: NOT DETECTED

## 2022-12-01 LAB — ETHANOL: Alcohol, Ethyl (B): <10 mg/dL (ref <10)

## 2022-12-01 LAB — SALICYLATE LEVEL: Salicylate Lvl: <7 mg/dL — ABNORMAL LOW (ref 7.0–30.0)

## 2022-12-01 LAB — ACETAMINOPHEN LEVEL: Acetaminophen (Tylenol), Serum: <10 ug/mL — ABNORMAL LOW (ref 10–30)

## 2022-12-01 MED ORDER — TRAZODONE HCL 100 MG PO TABS
100.0000 mg | ORAL_TABLET | Freq: Every day | ORAL | Status: DC
  Administered 2022-12-02: 100 mg via ORAL
  Filled 2022-12-01: qty 1

## 2022-12-01 NOTE — ED Provider Notes (Signed)
[facility] Provider Note    Event Date/Time   First MD Initiated Contact with Patient 12/01/22 1028     (approximate)   History   Help  HPI  [name] is a 32 y.o. male   who presents to the emergency department stating he needs help.  He is not very forthcoming with myself during my exam.  When I asked him what he needed help with he said everything.  He refused to answer questions about his mental health to me.  Per chart review patient does have history of schizophrenia.      Physical Exam   Triage Vital Signs: ED Triage Vitals  Encounter Vitals Group     BP 12/01/22 0919 114/88     Systolic BP Percentile --      Diastolic BP Percentile --      Pulse Rate 12/01/22 0919 90     Resp 12/01/22 0919 18     Temp 12/01/22 0919 97.6 F (36.4 C)     Temp Source 12/01/22 0919 Oral     SpO2 12/01/22 0919 96 %     Weight --      Height --      Head Circumference --      Peak Flow --      Pain Score 12/01/22 0920 0     Pain Loc --      Pain Education --      Exclude from Growth Chart --     Most recent vital signs: Vitals:   12/01/22 0919  BP: 114/88  Pulse: 90  Resp: 18  Temp: 97.6 F (36.4 C)  SpO2: 96%   General: Awake, alert. Not forthcoming. CV:  Good peripheral perfusion.  Resp:  Normal effort.  Abd:  No distention.    ED Results / Procedures / Treatments   Labs (all labs ordered are listed, but only abnormal results are displayed) Labs Reviewed  COMPREHENSIVE METABOLIC PANEL - Abnormal; Notable for the following components:      Result Value   Potassium 3.2 (*)    Glucose, Bld 104 (*)    All other components within normal limits  SALICYLATE LEVEL - Abnormal; Notable for the following components:   Salicylate Lvl <7.0 (*)    All other components within normal limits  ACETAMINOPHEN LEVEL - Abnormal; Notable for the following components:   Acetaminophen (Tylenol), Serum <10 (*)    All other components within normal  limits  ETHANOL  CBC  URINE DRUG SCREEN, QUALITATIVE ([facility] ONLY)     EKG  None   RADIOLOGY None   PROCEDURES:  Critical Care performed: No   MEDICATIONS ORDERED IN ED: Medications - No data to display   IMPRESSION / MDM / ASSESSMENT AND PLAN / ED COURSE  I reviewed the triage vital signs and the nursing notes.                              Differential diagnosis includes, but is not limited to, psychiatric illness, drug induced mood disorder  Patient's presentation is most consistent with acute presentation with potential threat to life or bodily function.  Patient with history of schizophrenia who presents to the emergency department today for help.  He is unable to tell me what he needs help with.  She is very withdrawn.  At this time will plan on having psychiatry evaluate.  The patient has been placed in  psychiatric observation due to the need to provide a safe environment for the patient while obtaining psychiatric consultation and evaluation, as well as ongoing medical and medication management to treat the patient's condition.  The patient has not been placed under full IVC at this time.      FINAL CLINICAL IMPRESSION(S) / ED DIAGNOSES   Final diagnoses:  Psychiatric illness         Note:  This document was prepared using Dragon voice recognition software and may include unintentional dictation errors.    [name], MD 12/01/22 1425

## 2022-12-01 NOTE — BH Assessment (Signed)
Comprehensive Clinical Assessment (CCA) Screening, Triage and Referral Note  12/01/2022 [name] [id number]  [name], 32 year old male who presents to [facility] ED voluntarily for treatment. Per triage note, Pt here with SI, states his medications are not working. Pt states he has a plan to kill himself. Pt states he is here to get into a facility and get help. Pt is voluntary.   During TTS assessment pt presents alert and oriented x 4, restless but cooperative, and mood-congruent with affect. The pt does not appear to be responding to internal or external stimuli. Neither is the pt presenting with any delusional thinking. Pt verified the information provided to triage RN.   Pt identifies his main complaint to be that he has been "going through so much." Patient reports issues with personal relationships. "People are so mean to me." Patient reports he lives in an apartment with his roommate. Patient states he is not working at this time; however, his family pays the rent. Patient states he would like to get started back on his medications. Patient reports INPT tx at [facility] about 7 months ago in which he did not follow up. Patient says shortly thereafter he was incarcerated for 5 months for assault. Patient says for the past couple of weeks, he has been experiencing AH/VH. Patient reports he is paranoid and does not feel safe in the world. "If I make the wrong decision, it could cost me my life. I can't listen to the bad voices." Patient reports poor sleep habits; staying up for days at a time. Patient says his appetite is "good." Patient admits to using marijuana at least twice a week and says he used cocaine last week. Pt denies current SI/HI.    Disposition pending   Chief Complaint:  Chief Complaint  Patient presents with   VOL   Suicidal   Visit Diagnosis: Schizophrenia  Patient Reported Information How did you hear about [place]? Self  What Is the Reason for Your Visit/Call Today?  Patient reports he wants to get started back on his medications. Experiencing AH/VH.  How Long Has This Been Causing You Problems? > than 6 months  What Do You Feel Would Help You the Most Today? Treatment for Depression or other mood problem; Medication(s)   Have You Recently Had Any Thoughts About Hurting Yourself? Yes  Are You Planning to Commit Suicide/Harm Yourself At This time? No   Have you Recently Had Thoughts About Hurting Someone [name]? No  Are You Planning to Harm Someone at This Time? No  Explanation: No data recorded  Have You Used Any Alcohol or Drugs in the Past 24 Hours? Yes  How Long Ago Did You Use Drugs or Alcohol? No data recorded What Did You Use and How Much? Marijuana   Do You Currently Have a Therapist/Psychiatrist? No  Name of Therapist/Psychiatrist: No data recorded  Have You Been Recently Discharged From Any Office Practice or Programs? No  Explanation of Discharge From Practice/Program: No data recorded   CCA Screening Triage Referral Assessment Type of Contact: Face-to-Face  Telemedicine Service Delivery:   Is this Initial or Reassessment?   Date Telepsych consult ordered in [facility]:    Time Telepsych consult ordered in [facility]:    Location of Assessment: [facility] ED  Provider Location: [facility] ED    Collateral Involvement: None provided   Does Patient Have a Court Appointed Legal Guardian? No data recorded Name and Contact of Legal Guardian: No data recorded If Minor and Not Living  with Parent(s), Who has Custody? No data recorded Is CPS involved or ever been involved? No data recorded Is APS involved or ever been involved? No data recorded  Patient Determined To Be At Risk for Harm To Self or Others Based on Review of Patient Reported Information or Presenting Complaint? No  Method: Plan without intent  Availability of Means: No data recorded Intent: Vague intent or NA  Notification Required: No need or identified person  Additional  Information for Danger to Others Potential: No data recorded Additional Comments for Danger to Others Potential: No data recorded Are There Guns or Other Weapons in Your Home? No  Types of Guns/Weapons: No data recorded Are These Weapons Safely Secured?                            No data recorded Who Could Verify You Are Able To Have These Secured: No data recorded Do You Have any Outstanding Charges, Pending Court Dates, Parole/Probation? No data recorded Contacted To Inform of Risk of Harm To Self or Others: No data recorded  Does Patient Present under Involuntary Commitment? No    [place] of Residence: [place]   Patient Currently Receiving the Following Services: Not Receiving Services   Determination of Need: Emergent (2 hours)   Options For Referral: ED Visit; Therapeutic Triage Services; Medication Management   Discharge Disposition:     [name], Counselor, LCAS-A

## 2022-12-01 NOTE — ED Notes (Addendum)
Pt reminded that Provider still needs urine, he states he doesn't feel like going to the [facility] right now. Pt aware lab still needs to be collected.

## 2022-12-01 NOTE — ED Notes (Signed)
Pt provided with lunch tray. Tray placed beside bed and Pt went back to sleep.

## 2022-12-01 NOTE — ED Notes (Signed)
Pt reminded that Provider still needs urine, he states he doesn't feel like going to the [facility] right now. Pt aware lab still needs to be collected.

## 2022-12-01 NOTE — ED Triage Notes (Signed)
Pt here with SI, states his medications are not working. Pt states he has a plan to kill himself. Pt states he is here to get into a facility and get help. Pt is voluntary.

## 2022-12-01 NOTE — ED Notes (Signed)
VOL/  PENDING  CONSULT 

## 2022-12-01 NOTE — ED Notes (Signed)
Contracts for safety while in the hospital.

## 2022-12-01 NOTE — Consult Note (Incomplete)
[facility] Psych ED Progress Note  12/02/2022 2:09 AM [name]  MRN:  [id number]   Method of visit?: Virtual (Location of provider: [facility] Location of patient: [facility] ED 20 Names and roles of anyone participating in the consult/assessment: Bishop Limbo, PMHNP; Burke Keels, RN This service was provided via telemedicine using a 2-way, interactive audio, and video technology. )  Subjective:  "I am having problems with my medication"  Per triage RN note: "Pt here with SI, states his medications are not working. Pt states he has a plan to kill himself. Pt states he is here to get into a facility and get help. Pt is voluntary. "  Diagnosis:  Active Problems:   Cannabis abuse   Suicide ideation   Schizophrenia, paranoid type (HCC)   Schizoaffective disorder (HCC)   Cocaine abuse (HCC)  HPI: [name] 32 y.o., male patient with a past psychiatric history of schizophrenia, cocaine abuse, marijuana abuse, suicidal ideation, and past suicide attempt in 2015 per chart review.  He presented to [facility] Emergency Department voluntarily as a walk in reporting suicidal ideation and expressing concerns about his medication being ineffective.   On evaluation [name] reports that his medication is ineffective and is causing him to have suicidal ideation, insomnia, and anger issues.  He reports he stopped taking the meds ~1 month ago because they were not working. He endorses active suicidal ideation without a plan.  He denies homicidal ideation.  He identifies a romantic relationship as a recent stressor.  He reports sleeping approximately 1 hour a night over the last week.  On my evaluation he denies auditory or visual hallucinations though he did endorse AVH about 5 hours ago during TTS evaluation.  He reports a previous psychiatric admission in May of this year and reports that he did not follow-up after discharge.  He reports he has not been taking his medications at all over the last  month citing that they have been ineffective.  He is unable to tell me the names of the medications.  Mr. Panis states that he does not currently work or go to school and lives in [place] with 1 roommate.  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  In addition to music and computers Mr. Debois also enjoys cooking.  He endorses marijuana use and infrequent cocaine use.  He last used cocaine a couple of days ago. He denies alcohol use.  During my evaluation, patient is alert, oriented x 4, and cooperative. Speech is clear, coherent and logical. Pt appears well groomed. Eye contact is good. Mood is dysphoric, affect is congruent with mood. Thought process is logical and thought content is coherent. Pt denies HI/AVH, he endorses SI. He does not appear to be responding to internal stimuli, external stimuli, displaying flight of ideas, or first rank symptoms. No delusions elicited during exam. The patient denies any guilt, loss of energy, decrease in concentration, anhedonia, psychomotor retardation, or suicidal ideations. He does endorse irritability, anger issues, and difficulties with sleep.  Per [employer], TTS note: "Pt identifies his main complaint to be that he has been "going through so much." Patient reports issues with personal relationships. "People are so mean to me." Patient reports he lives in an apartment with his roommate. Patient states he is not working at this time; however, his family pays the rent. Patient states he would like to get started back on  his medications. Patient reports INPT tx at [facility] about 7 months ago in which he did not follow up. Patient says shortly thereafter he was incarcerated for 5 months for assault. Patient says for the past couple of weeks, he has been experiencing AH/VH. Patient reports he is paranoid and does not feel safe in the world. "If I make the wrong decision, it could cost me my life. I  can't listen to the bad voices." Patient reports poor sleep habits; staying up for days at a time. Patient says his appetite is "good." Patient admits to using marijuana at least twice a week and says he used cocaine last week. Pt denies current SI/HI. "  Suicide risk factors: History of aggression, personal history of suicide attempt, stressful relationship, substance use  Suicide protective factors: feeling connected to family   Support encouragement and reassurance provided about ongoing stressors, patient provided with opportunity for questions.  Discussed recommendation for inpatient psychiatric admission for stabilization and treatment. Patient is in agreement.   Total Time spent with patient: 15 minutes  Past Psychiatric History: Schizoaffective disorder, cocaine use disorder, marijuana use disorder  Past Medical History:  Past Medical History:  Diagnosis Date   Schizo affective schizophrenia (HCC)    Schizophrenia (HCC)    No past surgical history on file. Family History: No family history on file. Family Psychiatric  History: No family history on file. Social History:  Social History   Substance and Sexual Activity  Alcohol Use Not Currently     Social History   Substance and Sexual Activity  Drug Use Yes   Types: Cocaine, Marijuana   Comment: crack 06-26-19    Social History   Socioeconomic History   Marital status: Single    Spouse name: Not on file   Number of children: Not on file   Years of education: Not on file   Highest education level: Not on file  Occupational History   Not on file  Tobacco Use   Smoking status: Every Day   Smokeless tobacco: Never  Substance and Sexual Activity   Alcohol use: Not Currently   Drug use: Yes    Types: Cocaine, Marijuana    Comment: crack 06-26-19   Sexual activity: Not on file  Other Topics Concern   Not on file  Social History Narrative   Not on file   Social Determinants of Health   Financial Resource  Strain: Not on file  Food Insecurity: Not on file  Transportation Needs: Not on file  Physical Activity: Not on file  Stress: Not on file  Social Connections: Unknown (05/22/2021)   [facility]   Social Network    Social Network: Not on file    Sleep: Poor  Appetite:  Good  Current Medications: Current Facility-Administered Medications  Medication Dose Route Frequency Provider Last Rate Last Admin   traZODone (DESYREL) tablet 100 mg  100 mg Oral QHS Romyn Boswell L, NP       Current Outpatient Medications  Medication Sig Dispense Refill   nicotine polacrilex (COMMIT) 2 MG lozenge Take 2 mg by mouth as needed.     ARIPiprazole (ABILIFY) 15 MG tablet Take 1 tablet (15 mg total) by mouth daily. (Patient not taking: Reported on 05/18/2022) 30 tablet 1   Cholecalciferol (VITAMIN D3) 50 MCG (2000 UT) TABS Take 1 tablet by mouth daily. (Patient not taking: Reported on 11/07/2022)     clozapine (CLOZARIL) 200 MG tablet Take 400 mg by mouth at  bedtime. (Patient not taking: Reported on 11/07/2022)     cloZAPine (CLOZARIL) 25 MG tablet Take 50 mg by mouth at bedtime. (Patient not taking: Reported on 05/18/2022)     divalproex (DEPAKOTE) 500 MG DR tablet Take 1 tablet (500 mg total) by mouth every morning. (Patient not taking: Reported on 05/18/2022) 30 tablet 1   ELIQUIS 5 MG TABS tablet Take 5 mg by mouth 2 (two) times daily. (Patient not taking: Reported on 11/07/2022)     lithium 600 MG capsule Take 1,200 mg by mouth at bedtime. (Patient not taking: Reported on 11/07/2022)     metFORMIN (GLUCOPHAGE-XR) 500 MG 24 hr tablet Take 1,000 mg by mouth at bedtime. (Patient not taking: Reported on 11/07/2022)     metoprolol tartrate (LOPRESSOR) 25 MG tablet Take 37.5 mg by mouth 2 (two) times daily. (Patient not taking: Reported on 11/07/2022)     OLANZapine (ZYPREXA) 10 MG tablet Take 10 mg by mouth at bedtime. (Patient not taking: Reported on 11/07/2022)     senna (SENOKOT)  8.6 MG TABS tablet Take 2 tablets by mouth at bedtime. (Patient not taking: Reported on 11/07/2022)     ZYPREXA RELPREVV 405 MG SUSR Inject into the muscle. (Patient not taking: Reported on 05/18/2022)      Lab Results:  Results for orders placed or performed during the hospital encounter of 12/01/22 (from the past 48 hour(s))  Comprehensive metabolic panel     Status: Abnormal   Collection Time: 12/01/22  9:24 AM  Result Value Ref Range   Sodium 139 135 - 145 mmol/L   Potassium 3.2 (L) 3.5 - 5.1 mmol/L   Chloride 102 98 - 111 mmol/L   CO2 24 22 - 32 mmol/L   Glucose, Bld 104 (H) 70 - 99 mg/dL    Comment: Glucose reference range applies only to samples taken after fasting for at least 8 hours.   BUN 12 6 - 20 mg/dL   Creatinine, Ser 1.61 0.61 - 1.24 mg/dL   Calcium 9.1 8.9 - 09.6 mg/dL   Total Protein 7.7 6.5 - 8.1 g/dL   Albumin 4.0 3.5 - 5.0 g/dL   AST 24 15 - 41 U/L   ALT 27 0 - 44 U/L   Alkaline Phosphatase 55 38 - 126 U/L   Total Bilirubin 0.8 <1.2 mg/dL   GFR, Estimated >04 >54 mL/min    Comment: (NOTE) Calculated using the CKD-EPI Creatinine Equation (2021)    Anion gap 13 5 - 15    Comment: Performed at [facility], [address] [place], [place] [address]  Ethanol     Status: None   Collection Time: 12/01/22  9:24 AM  Result Value Ref Range   Alcohol, Ethyl (B) <10 <10 mg/dL    Comment: (NOTE) Lowest detectable limit for serum alcohol is 10 mg/dL.  For medical purposes only. Performed at [facility], [address] [place], [place] [address]   Salicylate level     Status: Abnormal   Collection Time: 12/01/22  9:24 AM  Result Value Ref Range   Salicylate Lvl <7.0 (L) 7.0 - 30.0 mg/dL    Comment: Performed at [facility], [address] [place], [place] [address]  Acetaminophen level     Status: Abnormal   Collection Time: 12/01/22  9:24 AM  Result Value Ref Range   Acetaminophen (Tylenol), Serum <10 (L) 10 - 30 ug/mL     Comment: (NOTE) Therapeutic concentrations vary significantly. A range of 10-30 ug/mL  may be an effective concentration for many patients. However, some  are best treated at concentrations outside of this range. Acetaminophen concentrations >150 ug/mL at 4 hours after ingestion  and >50 ug/mL at 12 hours after ingestion are often associated with  toxic reactions.  Performed at [facility], [address] [place], [place] [address]   cbc     Status: None   Collection Time: 12/01/22  9:24 AM  Result Value Ref Range   WBC 5.3 4.0 - 10.5 K/uL   RBC 5.39 4.22 - 5.81 MIL/uL   Hemoglobin 15.4 13.0 - 17.0 g/dL   HCT 13.0 86.5 - 78.4 %   MCV 86.1 80.0 - 100.0 fL   MCH 28.6 26.0 - 34.0 pg   MCHC 33.2 30.0 - 36.0 g/dL   RDW 69.6 29.5 - 28.4 %   Platelets 198 150 - 400 K/uL   nRBC 0.0 0.0 - 0.2 %    Comment: Performed at [facility], [address], [place], [place] [address]  Urine Drug Screen, Qualitative     Status: Abnormal   Collection Time: 12/01/22  9:05 PM  Result Value Ref Range   Tricyclic, Ur Screen NONE DETECTED NONE DETECTED   Amphetamines, Ur Screen NONE DETECTED NONE DETECTED   MDMA (Ecstasy)Ur Screen NONE DETECTED NONE DETECTED   Cocaine Metabolite,Ur [place] POSITIVE (A) NONE DETECTED   Opiate, Ur Screen NONE DETECTED NONE DETECTED   Phencyclidine (PCP) Ur S NONE DETECTED NONE DETECTED   Cannabinoid 50 Ng, Ur  POSITIVE (A) NONE DETECTED   Barbiturates, Ur Screen NONE DETECTED NONE DETECTED   Benzodiazepine, Ur Scrn NONE DETECTED NONE DETECTED   Methadone Scn, Ur NONE DETECTED NONE DETECTED    Comment: (NOTE) Tricyclics + metabolites, urine    Cutoff 1000 ng/mL Amphetamines + metabolites, urine  Cutoff 1000 ng/mL MDMA (Ecstasy), urine              Cutoff 500 ng/mL Cocaine Metabolite, urine          Cutoff 300 ng/mL Opiate + metabolites, urine        Cutoff 300 ng/mL Phencyclidine (PCP), urine         Cutoff 25 ng/mL Cannabinoid, urine                  Cutoff 50 ng/mL Barbiturates + metabolites, urine  Cutoff 200 ng/mL Benzodiazepine, urine              Cutoff 200 ng/mL Methadone, urine                   Cutoff 300 ng/mL  The urine drug screen provides only a preliminary, unconfirmed analytical test result and should not be used for non-medical purposes. Clinical consideration and professional judgment should be applied to any positive drug screen result due to possible interfering substances. A more specific alternate chemical method must be used in order to obtain a confirmed analytical result. Gas chromatography / mass spectrometry (GC/MS) is the preferred confirm atory method. Performed at [facility], [address] [place], [place] [address]     Blood Alcohol level:  Lab Results  Component Value Date   [place] <10 12/01/2022   ETH <10 11/07/2022    Musculoskeletal: Strength & Muscle Tone: within normal limits Gait & Station:  Not assessed, patient lying in bed Patient leans:  Not assessed, patient lying in bed  Psychiatric Specialty Exam:  Presentation  General Appearance:  Casual (Dressed in ED scrubs, laying  in bed)  Eye Contact: Good  Speech: Clear and Coherent  Speech Volume: Normal  Handedness: Right   Mood and Affect  Mood: Dysphoric  Affect: Congruent   Thought Process  Thought Processes: Coherent; Linear  Descriptions of Associations:Intact  Orientation:Full (Time, Place and Person)  Thought Content:Logical  History of Schizophrenia/Schizoaffective disorder:No data recorded Duration of Psychotic Symptoms:Greater than six months  Hallucinations:Hallucinations: None  Ideas of Reference:None  Suicidal Thoughts:Suicidal Thoughts: Yes, Active SI Active Intent and/or Plan: Without Plan  Homicidal Thoughts:Homicidal Thoughts: No   Sensorium  Memory: Immediate Fair; Recent Fair; Remote Fair  Judgment: Fair  Insight: Good   Executive Functions   Concentration: Fair  Attention Span: Good  Recall: Good  Fund of Knowledge: Fair  Language: Good   Psychomotor Activity  Psychomotor Activity: Psychomotor Activity: Restlessness   Assets  Assets: Communication Skills; Desire for Improvement; Housing; Leisure Time; Physical Health   Sleep  Sleep: Sleep: Poor Number of Hours of Sleep: 1 (Reports 1 hour per night)    Physical Exam: Physical Exam Vitals and nursing note reviewed.  Constitutional:      General: He is not in acute distress.    Appearance: He is not toxic-appearing.  HENT:     Head: Normocephalic.  Cardiovascular:     Rate and Rhythm: Normal rate.     Pulses: Normal pulses.  Pulmonary:     Effort: Pulmonary effort is normal. No respiratory distress.  Abdominal:     General: There is no distension.     Tenderness: There is no guarding.  Musculoskeletal:        General: No deformity.  Neurological:     General: No focal deficit present.     Mental Status: He is alert and oriented to person, place, and time.     Motor: No weakness.  Psychiatric:        Speech: Speech normal.        Behavior: Behavior normal.        Thought Content: Thought content includes suicidal ideation.        Cognition and Memory: Cognition normal.    Review of Systems  Constitutional:  Negative for chills and fever.  Eyes:  Negative for blurred vision.  Respiratory:  Negative for cough, shortness of breath and wheezing.   Cardiovascular:  Negative for chest pain, palpitations and leg swelling.  Gastrointestinal:  Negative for abdominal pain, constipation, heartburn, nausea and vomiting.  Neurological:  Negative for dizziness, focal weakness and weakness.  Psychiatric/Behavioral:  Positive for depression, substance abuse and suicidal ideas. The patient has insomnia.    Blood pressure 107/67, pulse 67, temperature 98.7 F (37.1 C), temperature source Oral, resp. rate 19, SpO2 98%. There is no height or weight on  file to calculate BMI.  Treatment Plan Summary: Daily contact with patient to assess and evaluate symptoms and progress in treatment  Patient meets criteria for psychiatric inpatient treatment due to suicidal ideation and psychosis. Patient requires inpatient treatment for acute crisis management and psychiatric stabilization.  LCSW will fax out if no bed availability within [facility].   This visit was coded based on time. I spent a total of 35 minutes in both face-to-face and non-face-to-face activities for this visit on the date of this encounter.   This document was prepared using Dragon voice recognition software and may include unintentional dictation errors.  Bishop Limbo DNP, MBA, PMHNP-BC, FNP-BC  Psychiatric Mental Health Nurse Practitioner [facility]

## 2022-12-01 NOTE — ED Provider Notes (Signed)
-----------------------------------------   9:34 PM on 12/01/2022 ----------------------------------------- Patient has been seen and evaluated by psychiatry.  They will be recommending inpatient psychiatric treatment for the patient.  He remains here voluntarily at this time.   Minna Antis, MD 12/01/22 2134

## 2022-12-01 NOTE — ED Notes (Signed)
Pt states he wants to leave, This RN told Pt we are waiting for the Provider (Psych) to come see him, however he is insistent on leaving. EDP made aware to go and speak with Pt.

## 2022-12-01 NOTE — ED Notes (Signed)
Pt refused snack at this time.  

## 2022-12-01 NOTE — ED Notes (Signed)
Pt belongings:  1 pair of black sneakers (1 black watch in shoes) 1 pair of black socks 1 black long sleeved shirt 1 blue shirt 1 pair of red shorts 1 pair of green and red boxers 1 black cell phone with charger Multiple cards

## 2022-12-02 ENCOUNTER — Inpatient Hospital Stay: Admission: AD | Admit: 2022-12-02 | Payer: Medicare Other | Source: Intra-hospital

## 2022-12-02 DIAGNOSIS — F99 Mental disorder, not otherwise specified: Secondary | ICD-10-CM | POA: Diagnosis not present

## 2022-12-02 MED ORDER — LORAZEPAM 2 MG PO TABS
2.0000 mg | ORAL_TABLET | Freq: Once | ORAL | Status: AC
  Administered 2022-12-02: 2 mg via ORAL
  Filled 2022-12-02: qty 1

## 2022-12-02 NOTE — ED Notes (Signed)
VOl patient has bed assigned in BMU  315  when they can take patient

## 2022-12-02 NOTE — ED Notes (Signed)
Patient received lunch tray and beverage. 

## 2022-12-02 NOTE — ED Notes (Signed)
Patient transferred via w/c with escort of tech and guard, Patient without complaints.

## 2022-12-02 NOTE — ED Notes (Signed)
Patient is requesting to be discharged 

## 2022-12-02 NOTE — ED Provider Notes (Signed)
-----------------------------------------   8:19 PM on 12/02/2022 -----------------------------------------   Blood pressure 123/85, pulse 65, temperature 98.6 F (37 C), temperature source Oral, resp. rate 16, SpO2 100%.  Patient had episode of aggression and hitting his head against the wall.  Was given Ativan with resolution of symptoms.   Greg Lima, MD 12/02/22 [phone]

## 2022-12-02 NOTE — ED Notes (Signed)
Tech is performing EKG at this time, Patient is pleasant and cooperative.

## 2022-12-02 NOTE — BH Assessment (Signed)
Patient is to be admitted to [facility] today 12/02/22 pending discharges by Dr.  Marlou Porch .  Attending Physician will be Dr. Marlou Porch.   Patient has been assigned to room 315, by [facility] Charge Nurse Minerva Areola.    ER staff is aware of the admission: Misty Stanley, ER Secretary   Dr. Modesto Charon, ER MD  Toniann Fail Patient's Nurse  Juliette Alcide, Patient Access.

## 2022-12-02 NOTE — ED Notes (Signed)
Patient's mom has called several times and Patient is awake now, Nurse let him talk to her , she is very supportive. Patient remains calm talking with her.

## 2022-12-02 NOTE — ED Notes (Signed)
VOL/Consult still pending

## 2022-12-02 NOTE — ED Notes (Signed)
Writer spoke with patient and explained that he has a room in the BMU and would be going down soon once writer calls report. Patient agreeable with plan.

## 2022-12-02 NOTE — ED Notes (Addendum)
Patient was hitting head on wall.  Patient currently sitting in chair in dayroom and appears to be responding to internal stimuli.  Patient refusing to talk with writer and only giving hand gestures.

## 2022-12-02 NOTE — ED Notes (Signed)
Nurse let Patient know that He would be inpatient for treatment per NP( Psych). Patient states that He is glad, because He is really depressed and needs help. Patient is calm and cooperative, nurse will continue to monitor for safety.

## 2022-12-02 NOTE — ED Notes (Addendum)
Pt denied shower this morning when asked.Nurse Toniann Fail was Notified.

## 2022-12-03 DIAGNOSIS — F99 Mental disorder, not otherwise specified: Secondary | ICD-10-CM | POA: Diagnosis not present

## 2022-12-03 MED ORDER — POTASSIUM CHLORIDE CRYS ER 20 MEQ PO TBCR
40.0000 meq | EXTENDED_RELEASE_TABLET | Freq: Once | ORAL | Status: AC
  Administered 2022-12-03: 40 meq via ORAL
  Filled 2022-12-03: qty 2

## 2022-12-03 NOTE — BH Assessment (Deleted)
PATIENT BED AVAILABLE AFTER 9AM ON 12/03/22  Patient has been accepted to [facility].  Patient assigned to [facility] Accepting physician is Dr. Betti Cruz.  Call report to [phone].  Representative was [employer].   ER Staff is aware of it:  [facility] ER Secretary  Dr. Dolores Frame, ER MD  Elmyra Ricks. Patient's Nurse

## 2022-12-03 NOTE — ED Notes (Signed)
Accepted to [facility] after 1pm today

## 2022-12-03 NOTE — BH Assessment (Signed)
Referral information for Psychiatric Hospitalization RE-faxed to;    [facility] [phone]- [phone]) No available beds   [facility] [phone]- [phone]),    [facility] [phone]),   [facility] [phone]),    [place] [phone])   [facility] [phone])  Bed was canceled at [facility]

## 2022-12-03 NOTE — ED Provider Notes (Signed)
Emergency Medicine Observation Re-evaluation Note  [name] is a 32 y.o. male, seen on rounds today.  Pt initially presented to the ED for complaints of VOL and Suicidal Currently, the patient is sleeping without distress.  Nursing staff, [name], advising no concerns at this time though he did have an episode of aggression yesterday.  Physical Exam  BP 123/85 (BP Location: Right Arm)   Pulse 65   Temp 98.6 F (37 C) (Oral)   Resp 16   SpO2 100%  Physical Exam  Currently sleeping, comfortably without distress  ED Course / MDM  EKG:   I have reviewed the labs performed to date as well as medications administered while in observation.   Noted yesterday 'Patient had episode of aggression and hitting his head against the wall. Was given Ativan with resolution of symptoms."  Plan  Current plan is for psych inpatient.    Sharyn Creamer, MD 12/03/22 [phone]

## 2022-12-03 NOTE — BH Assessment (Signed)
PATIENT BED AVAILABLE AFTER 1PM ON 12/03/22  Patient has been accepted to [facility].  Patient assigned to [facility]-Unit Accepting physician is Dr. Sherrian Divers.  Call report to [phone].  Representative was Precious.   ER Staff is aware of it:  [facility] ER Secretary  Dr. Dolores Frame, ER MD  Elmyra Ricks. Patient's Nurse

## 2022-12-03 NOTE — BH Assessment (Signed)
Per [facility]), patient to be referred out of system.  Referral information for Psychiatric Hospitalization faxed to;   [facility] [phone]- [phone]) No available beds  [facility] [phone]- [phone]),   [facility] [phone]),  [address] [phone]),   [facility] [phone] -or- [phone]),   [facility] [phone])  [facility] [phone])

## 2022-12-03 NOTE — ED Notes (Signed)
Writer spoke with Management consultant. Per charge RN, [facility] is to review patient and to make sure patient is suitable for admission in the BMU due to previous aggressive behaviors and needing PRN orders for medications for aggressive behaviors.

## 2023-01-16 ENCOUNTER — Other Ambulatory Visit: Payer: Self-pay

## 2023-01-16 ENCOUNTER — Encounter: Payer: Self-pay | Admitting: Emergency Medicine

## 2023-01-16 ENCOUNTER — Emergency Department
Admission: EM | Admit: 2023-01-16 | Discharge: 2023-01-17 | Disposition: A | Discharge status: Other Acute Inpatient Hospital | Payer: Medicare Other | Attending: Emergency Medicine | Admitting: Emergency Medicine

## 2023-01-16 DIAGNOSIS — Z91148 Patient's other noncompliance with medication regimen for other reason: Secondary | ICD-10-CM | POA: Insufficient documentation

## 2023-01-16 DIAGNOSIS — R45851 Suicidal ideations: Secondary | ICD-10-CM

## 2023-01-16 DIAGNOSIS — F209 Schizophrenia, unspecified: Secondary | ICD-10-CM | POA: Diagnosis present

## 2023-01-16 DIAGNOSIS — F259 Schizoaffective disorder, unspecified: Secondary | ICD-10-CM | POA: Diagnosis not present

## 2023-01-16 DIAGNOSIS — R451 Restlessness and agitation: Secondary | ICD-10-CM | POA: Diagnosis present

## 2023-01-16 DIAGNOSIS — F1520 Other stimulant dependence, uncomplicated: Secondary | ICD-10-CM | POA: Diagnosis present

## 2023-01-16 DIAGNOSIS — F200 Paranoid schizophrenia: Secondary | ICD-10-CM | POA: Diagnosis present

## 2023-01-16 DIAGNOSIS — Z7901 Long term (current) use of anticoagulants: Secondary | ICD-10-CM | POA: Insufficient documentation

## 2023-01-16 DIAGNOSIS — F1410 Cocaine abuse, uncomplicated: Secondary | ICD-10-CM | POA: Diagnosis present

## 2023-01-16 DIAGNOSIS — F29 Unspecified psychosis not due to a substance or known physiological condition: Secondary | ICD-10-CM

## 2023-01-16 DIAGNOSIS — F19950 Other psychoactive substance use, unspecified with psychoactive substance-induced psychotic disorder with delusions: Secondary | ICD-10-CM | POA: Diagnosis present

## 2023-01-16 LAB — CBC WITH DIFFERENTIAL/PLATELET
Abs Immature Granulocytes: 0.01 10*3/uL (ref 0.00–0.07)
Basophils Absolute: 0 10*3/uL (ref 0.0–0.1)
Basophils Relative: 0 %
Eosinophils Absolute: 0 10*3/uL (ref 0.0–0.5)
Eosinophils Relative: 0 %
HCT: 51.6 % (ref 39.0–52.0)
Hemoglobin: 16.7 g/dL (ref 13.0–17.0)
Immature Granulocytes: 0 %
Lymphocytes Relative: 29 %
Lymphs Abs: 2.2 10*3/uL (ref 0.7–4.0)
MCH: 28.7 pg (ref 26.0–34.0)
MCHC: 32.4 g/dL (ref 30.0–36.0)
MCV: 88.7 fL (ref 80.0–100.0)
Monocytes Absolute: 1 10*3/uL (ref 0.1–1.0)
Monocytes Relative: 13 %
Neutro Abs: 4.4 10*3/uL (ref 1.7–7.7)
Neutrophils Relative %: 58 %
Platelets: 286 10*3/uL (ref 150–400)
RBC: 5.82 MIL/uL — ABNORMAL HIGH (ref 4.22–5.81)
RDW: 13.4 % (ref 11.5–15.5)
WBC: 7.6 10*3/uL (ref 4.0–10.5)
nRBC: 0 % (ref 0.0–0.2)

## 2023-01-16 LAB — CBC
HCT: 50 % (ref 39.0–52.0)
Hemoglobin: 16.6 g/dL (ref 13.0–17.0)
MCH: 28.4 pg (ref 26.0–34.0)
MCHC: 33.2 g/dL (ref 30.0–36.0)
MCV: 85.5 fL (ref 80.0–100.0)
Platelets: 309 10*3/uL (ref 150–400)
RBC: 5.85 MIL/uL — ABNORMAL HIGH (ref 4.22–5.81)
RDW: 13.2 % (ref 11.5–15.5)
WBC: 7.8 10*3/uL (ref 4.0–10.5)
nRBC: 0 % (ref 0.0–0.2)

## 2023-01-16 LAB — COMPREHENSIVE METABOLIC PANEL
ALT: 16 U/L (ref 0–44)
AST: 18 U/L (ref 15–41)
Albumin: 5 g/dL (ref 3.5–5.0)
Alkaline Phosphatase: 66 U/L (ref 38–126)
Anion gap: 12 (ref 5–15)
BUN: 13 mg/dL (ref 6–20)
CO2: 27 mmol/L (ref 22–32)
Calcium: 10.1 mg/dL (ref 8.9–10.3)
Chloride: 105 mmol/L (ref 98–111)
Creatinine, Ser: 1.14 mg/dL (ref 0.61–1.24)
GFR, Estimated: >60 mL/min (ref >60)
Glucose, Bld: 96 mg/dL (ref 70–99)
Potassium: 4 mmol/L (ref 3.5–5.1)
Sodium: 144 mmol/L (ref 135–145)
Total Bilirubin: 0.8 mg/dL (ref 0.0–1.2)
Total Protein: 8.9 g/dL — ABNORMAL HIGH (ref 6.5–8.1)

## 2023-01-16 LAB — LITHIUM LEVEL: Lithium Lvl: <0.06 mmol/L — ABNORMAL LOW (ref 0.60–1.20)

## 2023-01-16 LAB — SALICYLATE LEVEL: Salicylate Lvl: <7 mg/dL — ABNORMAL LOW (ref 7.0–30.0)

## 2023-01-16 LAB — ACETAMINOPHEN LEVEL: Acetaminophen (Tylenol), Serum: <10 ug/mL — ABNORMAL LOW (ref 10–30)

## 2023-01-16 LAB — VALPROIC ACID LEVEL: Valproic Acid Lvl: 116 ug/mL — ABNORMAL HIGH (ref 50.0–100.0)

## 2023-01-16 LAB — ETHANOL: Alcohol, Ethyl (B): <10 mg/dL (ref <10)

## 2023-01-16 MED ORDER — OLANZAPINE 10 MG PO TBDP: 10.0000 mg | ORAL_TABLET | Freq: Three times a day (TID) | ORAL | Status: DC | PRN

## 2023-01-16 MED ORDER — METFORMIN HCL ER 500 MG PO TB24
1000.0000 mg | ORAL_TABLET | Freq: Every day | ORAL | Status: DC
  Administered 2023-01-16: 1000 mg via ORAL
  Filled 2023-01-16: qty 2

## 2023-01-16 MED ORDER — LORAZEPAM 2 MG/ML IJ SOLN
2.0000 mg | Freq: Once | INTRAMUSCULAR | Status: AC
  Administered 2023-01-16: 2 mg via INTRAMUSCULAR
  Filled 2023-01-16: qty 1

## 2023-01-16 MED ORDER — METOPROLOL TARTRATE 25 MG PO TABS
37.5000 mg | ORAL_TABLET | Freq: Two times a day (BID) | ORAL | Status: DC
  Filled 2023-01-16 (×2): qty 1.5

## 2023-01-16 MED ORDER — CLOZAPINE 100 MG PO TABS
50.0000 mg | ORAL_TABLET | Freq: Every day | ORAL | Status: DC
  Administered 2023-01-16: 50 mg via ORAL
  Filled 2023-01-16: qty 1

## 2023-01-16 MED ORDER — VITAMIN D3 25 MCG (1000 UNIT) PO TABS
2000.0000 [IU] | ORAL_TABLET | Freq: Every day | ORAL | Status: DC
  Administered 2023-01-17: 2000 [IU] via ORAL
  Filled 2023-01-16: qty 2

## 2023-01-16 MED ORDER — OLANZAPINE 10 MG PO TABS
10.0000 mg | ORAL_TABLET | Freq: Every day | ORAL | Status: DC
  Administered 2023-01-16: 10 mg via ORAL
  Filled 2023-01-16: qty 1

## 2023-01-16 MED ORDER — LORAZEPAM 1 MG PO TABS: 1.0000 mg | ORAL_TABLET | ORAL | Status: DC | PRN

## 2023-01-16 NOTE — BH Assessment (Signed)
 Per [facility] Janalyn S), patient to be referred out of system.  Referral information for Psychiatric Hospitalization faxed to;   [facility] [phone]- [phone]), No appropriate bed  [facility] [phone]),   [place] [phone]),  [place] [phone]--- [phone]--- [phone]--- [phone])  [phone]),   [place] [phone] -or- [phone]),   [facility] [phone] phone-- [phone])  [facility] [place] [phone]),  [place] [phone])  [place] [phone]).  [facility] [phone]).

## 2023-01-16 NOTE — ED Notes (Signed)
 2 Legal documents scanned and e-filed

## 2023-01-16 NOTE — Progress Notes (Signed)
 Pharmacy Consult - Clozapine      33 yo male ordered clozapine  50 mg PO HS  This patient's order has been reviewed for prescribing contraindications.    Clozapine  REMS enrollment Verified: yes on 01/16/23 REMS patient ID: [id number] Current Outpatient Monitoring: Monthly  RDA: [id number]  Home Regimen: unknown   Dose Adjustments This Admission: N/A   Labs: Date    ANC    Submitted? 01/16/23 4400 Yes     Plan: Start clozapine  50 mg po HS Monitor ANC at least weekly while inpatient
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
## 2023-01-16 NOTE — ED Triage Notes (Signed)
 Patient brought in by [facility] police for suicidal ideations without plan. Patient repeatedly saying he would kill himself and god in triage but denies HI.

## 2023-01-16 NOTE — ED Notes (Signed)
 Hospital meal provided, pt tolerated w/o complaints.  Waste discarded appropriately.

## 2023-01-16 NOTE — BH Assessment (Signed)
 Patient has been accepted to [facility].  Patient assigned [address] Accepting physician is Dr. Oneil Charleston.  Call report to [phone].  Representative was Ava.   ER Staff is aware of it:  Ronnie, ER Secretary  Izetta, Patient's Nurse   Address: [address]  [place], [place] [address]  Bed is available tomorrow (01/17/2023), after 7am.

## 2023-01-16 NOTE — ED Notes (Signed)
 Pt still sleeping,

## 2023-01-16 NOTE — ED Notes (Signed)
 Patient responding to internal stimuli.  Patient stating "God told me to shut up. Who is he to tell me that."  As we were walking to room patient giving other patients the middle finger. Patient once in room asking writer "Who am I"

## 2023-01-16 NOTE — Consult Note (Signed)
 Patient evaluated by psychiatric team; he meets criteria for psychiatric admission.  Recommend EKG to rule out prolonged QT intervals then home meds restarted.  Completed consult note pending.

## 2023-01-16 NOTE — ED Notes (Signed)
 Staff returned to room to administer PRN medications, pt had fallen back asleep.  Staff did not administer PRN med.s allowing pt to continue to rest.

## 2023-01-16 NOTE — ED Notes (Signed)
Breakfast at bedside.

## 2023-01-16 NOTE — ED Notes (Signed)
 Pt requested staff to view roof of mouth d/t pain, pt stated that's where the evil comes from or it may just be cancer.  Pt requesting medication as auditory hallucinations were returning and getting worse staff notified on call psych provider, PRN orders received

## 2023-01-16 NOTE — ED Provider Notes (Signed)
 [facility] Provider Note    Event Date/Time   First MD Initiated Contact with Patient 01/16/23 [phone]     (approximate)   History   Suicidal   HPI  [name] is a 33 y.o. male with history of schizophrenia who presents to the emergency department with suicidal thoughts.  Patient is unable to tell me if he has a plan.  Patient is talking about killing God.  He also states that he was in [place]  and [place]  but not sure how he got there.  He also states that he is famous and he is on the television and all over the Internet.  He states he feels very anxious.  Denies drug or alcohol use.  Denies HI but states he is like a cat backed into a corner that may attack.   History provided by patient.    Past Medical History:  Diagnosis Date   Schizo affective schizophrenia (HCC)    Schizophrenia (HCC)     History reviewed. No pertinent surgical history.  MEDICATIONS:  Prior to Admission medications   Medication Sig Start Date End Date Taking? Authorizing Provider  ARIPiprazole  (ABILIFY ) 15 MG tablet Take 1 tablet (15 mg total) by mouth daily. Patient not taking: Reported on 05/18/2022 06/30/19   Clapacs, Norleen DASEN, MD  Cholecalciferol  (VITAMIN D3) 50 MCG (2000 UT) TABS Take 1 tablet by mouth daily. Patient not taking: Reported on 11/07/2022 05/06/22   [provider]  clozapine  (CLOZARIL ) 200 MG tablet Take 400 mg by mouth at bedtime. Patient not taking: Reported on 11/07/2022 05/07/22   [provider]  cloZAPine  (CLOZARIL ) 25 MG tablet Take 50 mg by mouth at bedtime. Patient not taking: Reported on 05/18/2022 05/06/22   [provider]  divalproex  (DEPAKOTE ) 500 MG DR tablet Take 1 tablet (500 mg total) by mouth every morning. Patient not taking: Reported on 05/18/2022 06/30/19   Clapacs, Norleen DASEN, MD  ELIQUIS 5 MG TABS tablet Take 5 mg by mouth 2 (two) times daily. Patient not taking: Reported on 11/07/2022 05/06/22   [provider]  lithium  600 MG capsule Take 1,200 mg by mouth at bedtime. Patient not taking: Reported on 11/07/2022 05/07/22   [provider]  metFORMIN  (GLUCOPHAGE -XR) 500 MG 24 hr tablet Take 1,000 mg by mouth at bedtime. Patient not taking: Reported on 11/07/2022 05/06/22   [provider]  metoprolol  tartrate (LOPRESSOR ) 25 MG tablet Take 37.5 mg by mouth 2 (two) times daily. Patient not taking: Reported on 11/07/2022 05/06/22   [provider]  nicotine  polacrilex (COMMIT) 2 MG lozenge Take 2 mg by mouth as needed. 02/23/22   [provider]  OLANZapine  (ZYPREXA ) 10 MG tablet Take 10 mg by mouth at bedtime. Patient not taking: Reported on 11/07/2022 03/16/22   [provider]  senna (SENOKOT) 8.6 MG TABS tablet Take 2 tablets by mouth at bedtime. Patient not taking: Reported on 11/07/2022 05/06/22   [provider]  ZYPREXA  RELPREVV 405 MG SUSR Inject into the muscle. Patient not taking: Reported on 05/18/2022 02/19/22   [provider]    Physical Exam   Triage Vital Signs: ED Triage Vitals  Encounter Vitals Group     BP 01/16/23 0305 127/89     Systolic BP Percentile --      Diastolic BP Percentile --      Pulse Rate 01/16/23 0305 96     Resp 01/16/23 0305 18     Temp 01/16/23  0305 97.9 F (36.6 C)     Temp Source 01/16/23 0305 Oral     SpO2 01/16/23 0305 100 %     Weight 01/16/23 0308 170 lb (77.1 kg)     Height 01/16/23 0308 5' 11 (1.803 m)     Head Circumference --      Peak Flow --      Pain Score 01/16/23 0317 0     Pain Loc --      Pain Education --      Exclude from Growth Chart --     Most recent vital signs: Vitals:   01/16/23 0305  BP: 127/89  Pulse: 96  Resp: 18  Temp: 97.9 F (36.6 C)  SpO2: 100%    CONSTITUTIONAL: Alert, responds appropriately to questions. Well-appearing; well-nourished HEAD: Normocephalic, atraumatic EYES: Conjunctivae clear, pupils appear equal, sclera nonicteric ENT:  normal nose; moist mucous membranes NECK: Supple, normal ROM CARD: RRR; S1 and S2 appreciated RESP: Normal chest excursion without splinting or tachypnea; breath sounds clear and equal bilaterally; no wheezes, no rhonchi, no rales, no hypoxia or respiratory distress, speaking full sentences ABD/GI: Non-distended; soft, non-tender, no rebound, no guarding, no peritoneal signs BACK: The back appears normal EXT: Normal ROM in all joints; no deformity noted, no edema SKIN: Normal color for age and race; warm; no rash on exposed skin NEURO: Moves all extremities equally, normal speech PSYCH: Actively psychotic.  SI without plan.  Poor insight.  Rapid, pressured speech.  Appears to be responding to internal stimuli.   ED Results / Procedures / Treatments   LABS: (all labs ordered are listed, but only abnormal results are displayed) Labs Reviewed  COMPREHENSIVE METABOLIC PANEL - Abnormal; Notable for the following components:      Result Value   Total Protein 8.9 (*)    All other components within normal limits  SALICYLATE LEVEL - Abnormal; Notable for the following components:   Salicylate Lvl <7.0 (*)    All other components within normal limits  ACETAMINOPHEN  LEVEL - Abnormal; Notable for the following components:   Acetaminophen  (Tylenol ), Serum <10 (*)    All other components within normal limits  CBC - Abnormal; Notable for the following components:   RBC 5.85 (*)    All other components within normal limits  VALPROIC ACID  LEVEL - Abnormal; Notable for the following components:   Valproic Acid  Lvl 116 (*)    All other components within normal limits  LITHIUM  LEVEL - Abnormal; Notable for the following components:   Lithium  Lvl <0.06 (*)    All other components within normal limits  ETHANOL  URINE DRUG SCREEN, QUALITATIVE ([facility] ONLY)     EKG:  EKG Interpretation Date/Time:    Ventricular Rate:    PR Interval:    QRS Duration:    QT Interval:    QTC Calculation:   R  Axis:      Text Interpretation:           RADIOLOGY: My personal review and interpretation of imaging:    I have personally reviewed all radiology reports.   No results found.   PROCEDURES:  Critical Care performed: No   Procedures    IMPRESSION / MDM / ASSESSMENT AND PLAN / ED COURSE  I reviewed the triage vital signs and the nursing notes.    Patient here with psychosis, SI.    DIFFERENTIAL DIAGNOSIS (includes but not limited to):   Psychosis, SI, schizophrenia, substance abuse, mania   Patient's presentation is most  consistent with acute presentation with potential threat to life or bodily function.   PLAN: Will obtain screening labs, urine.  Patient very anxious, manic, actively psychotic here.  Offered him IM medication to help with agitation which he agrees to.  This is not a chemical restraint.  Will consult psychiatry and TTS.  Will place him under full IVC.   MEDICATIONS GIVEN IN ED: Medications  LORazepam  (ATIVAN ) injection 2 mg (2 mg Intramuscular Given 01/16/23 0402)     ED COURSE: Labs show normal electrolytes, hemoglobin.  Tylenol  salicylate level negative.  Depakote  level elevated to 116.  Will hold giving him any further Depakote .  Patient has no clinical signs of Depakote  toxicity.  Lithium  level undetectable.  Patient medically cleared at this time.   Patient needs medication reconciliation.  CONSULTS: TTS and psychiatry consulted for further disposition.   OUTSIDE RECORDS REVIEWED: Reviewed previous psychiatric notes.       FINAL CLINICAL IMPRESSION(S) / ED DIAGNOSES   Final diagnoses:  Suicidal ideation  Psychosis, unspecified psychosis type (HCC)     Rx / DC Orders   ED Discharge Orders     None        Note:  This document was prepared using Dragon voice recognition software and may include unintentional dictation errors.   Shell Blanchette, Josette SAILOR, DO 01/16/23 [phone]
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
## 2023-01-16 NOTE — BH Assessment (Signed)
 Psych Team spoke with the patient to complete an updated/reassessment. Patient continue to endorse SI and denies HI and AV/H.

## 2023-01-16 NOTE — ED Notes (Signed)
 Pt still sleeping,Meal tray provided

## 2023-01-16 NOTE — ED Notes (Signed)
 Patient dressed in hospital scrubs. Belongings include:   Wallet, phone, charger, blue jeans, red shorts, black shoes, black jacket, white socks, red sweatshirt, 4 cards, lighter

## 2023-01-16 NOTE — ED Notes (Signed)
 Patient is IVC pending admit to [facility] on 01-17-23 after 7am

## 2023-01-17 DIAGNOSIS — F259 Schizoaffective disorder, unspecified: Secondary | ICD-10-CM | POA: Diagnosis not present

## 2023-01-17 NOTE — ED Notes (Signed)
 Pt awake, asked for orange juice. Security officer April took pt cup of orange juice.

## 2023-01-17 NOTE — ED Provider Notes (Signed)
 Vitals:   01/17/23 0933 01/17/23 1106  BP: 99/66 (!) 96/55  Pulse: (!) 110 71  Resp:  16  Temp:  97.6 F (36.4 C)  SpO2:  98%   Patient alert oriented, resting in bed without distress.  Currently blood pressure mild hypotension but he is fully alert asymptomatic, drinking ginger ale at this time and agreeable with plan to transfer to [facility], MD 01/17/23 1124

## 2023-01-17 NOTE — ED Notes (Signed)
 Report given to Darral Dash, Charity fundraiser.

## 2023-01-17 NOTE — ED Notes (Signed)
 Pt asked to submit urine sample but he says he doesn't need to pee.

## 2023-01-17 NOTE — ED Notes (Signed)
 Pt transferring to [facility]. Educated on admission and he verbalized understanding. Given all his personal belongings. Stable, ambulatory and in NAD. Escorted out to Emergency planning/management officer for transport to [facility].

## 2023-01-17 NOTE — ED Provider Notes (Signed)
 Emergency Medicine Observation Re-evaluation Note  [name] is a 33 y.o. male, seen on rounds today.  Pt initially presented to the ED for complaints of Suicidal Currently, the patient is resting comfortably.  Physical Exam  BP 98/60   Pulse 70   Temp 98.7 F (37.1 C) (Oral)   Resp 19   Ht 5' 11 (1.803 m)   Wt 77.1 kg   SpO2 97%   BMI 23.71 kg/m  Physical Exam General: No acute distress Cardiac: No cyanosis Lungs: Normal work of breathing Psych: Calm and cooperative  ED Course / MDM  EKG:   I have reviewed the labs performed to date as well as medications administered while in observation.  Recent changes in the last 24 hours include no acute events.  Plan  Current plan is for placement at [facility] with transfer today.    Malvina Alm DASEN, MD 01/17/23 [phone]

## 2023-01-17 NOTE — ED Notes (Signed)
 Breakfast tray and morning snack provided to pt.

## 2023-01-17 NOTE — ED Notes (Signed)
 EMTALA Reviewed by this RN.

## 2023-01-17 NOTE — ED Notes (Signed)
 Dr Fanny Bien in to talk with pt about transfer to [facility]

## 2023-01-30 ENCOUNTER — Other Ambulatory Visit: Payer: Self-pay

## 2023-01-30 ENCOUNTER — Emergency Department
Admission: EM | Admit: 2023-01-30 | Discharge: 2023-02-01 | Disposition: A | Discharge status: Other Acute Inpatient Hospital | Payer: Medicare Other | Attending: Emergency Medicine | Admitting: Emergency Medicine

## 2023-01-30 DIAGNOSIS — F29 Unspecified psychosis not due to a substance or known physiological condition: Secondary | ICD-10-CM | POA: Diagnosis present

## 2023-01-30 DIAGNOSIS — F200 Paranoid schizophrenia: Secondary | ICD-10-CM | POA: Diagnosis not present

## 2023-01-30 DIAGNOSIS — R7309 Other abnormal glucose: Secondary | ICD-10-CM | POA: Insufficient documentation

## 2023-01-30 DIAGNOSIS — F209 Schizophrenia, unspecified: Secondary | ICD-10-CM

## 2023-01-30 DIAGNOSIS — F19950 Other psychoactive substance use, unspecified with psychoactive substance-induced psychotic disorder with delusions: Secondary | ICD-10-CM | POA: Diagnosis present

## 2023-01-30 LAB — CBC
HCT: 43.5 % (ref 39.0–52.0)
Hemoglobin: 14.9 g/dL (ref 13.0–17.0)
MCH: 28.7 pg (ref 26.0–34.0)
MCHC: 34.3 g/dL (ref 30.0–36.0)
MCV: 83.8 fL (ref 80.0–100.0)
Platelets: 269 10*3/uL (ref 150–400)
RBC: 5.19 MIL/uL (ref 4.22–5.81)
RDW: 12.4 % (ref 11.5–15.5)
WBC: 8.4 10*3/uL (ref 4.0–10.5)
nRBC: 0 % (ref 0.0–0.2)

## 2023-01-30 LAB — COMPREHENSIVE METABOLIC PANEL
ALT: 63 U/L — ABNORMAL HIGH (ref 0–44)
AST: 23 U/L (ref 15–41)
Albumin: 4.4 g/dL (ref 3.5–5.0)
Alkaline Phosphatase: 78 U/L (ref 38–126)
Anion gap: 12 (ref 5–15)
BUN: 14 mg/dL (ref 6–20)
CO2: 23 mmol/L (ref 22–32)
Calcium: 9.2 mg/dL (ref 8.9–10.3)
Chloride: 102 mmol/L (ref 98–111)
Creatinine, Ser: 1.01 mg/dL (ref 0.61–1.24)
GFR, Estimated: >60 mL/min (ref >60)
Glucose, Bld: 114 mg/dL — ABNORMAL HIGH (ref 70–99)
Potassium: 3.5 mmol/L (ref 3.5–5.1)
Sodium: 137 mmol/L (ref 135–145)
Total Bilirubin: 0.8 mg/dL (ref 0.0–1.2)
Total Protein: 8.4 g/dL — ABNORMAL HIGH (ref 6.5–8.1)

## 2023-01-30 LAB — ETHANOL: Alcohol, Ethyl (B): <10 mg/dL (ref <10)

## 2023-01-30 LAB — ACETAMINOPHEN LEVEL: Acetaminophen (Tylenol), Serum: <10 ug/mL — ABNORMAL LOW (ref 10–30)

## 2023-01-30 LAB — SALICYLATE LEVEL: Salicylate Lvl: <7 mg/dL — ABNORMAL LOW (ref 7.0–30.0)

## 2023-01-30 MED ORDER — OLANZAPINE 10 MG PO TABS
10.0000 mg | ORAL_TABLET | Freq: Every day | ORAL | Status: DC
  Administered 2023-01-30 – 2023-01-31 (×2): 10 mg via ORAL
  Filled 2023-01-30 (×2): qty 1

## 2023-01-30 MED ORDER — METOPROLOL TARTRATE 25 MG PO TABS
37.5000 mg | ORAL_TABLET | Freq: Two times a day (BID) | ORAL | Status: DC
  Administered 2023-01-30 – 2023-01-31 (×2): 37.5 mg via ORAL
  Filled 2023-01-30 (×3): qty 2

## 2023-01-30 MED ORDER — ARIPIPRAZOLE 15 MG PO TABS
15.0000 mg | ORAL_TABLET | Freq: Every day | ORAL | Status: DC
  Administered 2023-01-30 – 2023-02-01 (×3): 15 mg via ORAL
  Filled 2023-01-30 (×3): qty 1

## 2023-01-30 MED ORDER — DIVALPROEX SODIUM 500 MG PO DR TAB
500.0000 mg | DELAYED_RELEASE_TABLET | ORAL | Status: DC
  Administered 2023-01-31 – 2023-02-01 (×2): 500 mg via ORAL
  Filled 2023-01-30 (×2): qty 1

## 2023-01-30 MED ORDER — METFORMIN HCL ER 500 MG PO TB24
1000.0000 mg | ORAL_TABLET | Freq: Every day | ORAL | Status: DC
  Administered 2023-01-31 (×2): 1000 mg via ORAL
  Filled 2023-01-30 (×2): qty 2

## 2023-01-30 NOTE — ED Provider Notes (Signed)
[facility] Provider Note    Event Date/Time   First MD Initiated Contact with Patient 01/30/23 2324     (approximate)   History   Psychiatric Evaluation  EM caveat: Very elevated mood, confused, flight of ideas  HPI  [name] is a 33 y.o. male with a history of schizophrenia also seen for suicidal ideations January 4 of this year.  Was also noted to have a history of cocaine abuse.  Patient was dropped off for concerns of suicidal ideation.  Cannot obtain any reasonable history from the patient.  He was stand in the room he points that individuals and security staff, he walks about, he seems to be reacting to external stimuli.  He seems to be pointing and conversing with persons that are not present.  He cannot really describe the situation of what is happening.  Denies being in pain.  Does not wish to be examined.  Has rather abrasive attitude but has not made any gestures to suggest violence.  He does report that sometimes he needs to get his medicine and needs to get a shot.     Physical Exam   Triage Vital Signs: ED Triage Vitals  Encounter Vitals Group     BP 01/30/23 2307 (!) 122/96     Systolic BP Percentile --      Diastolic BP Percentile --      Pulse Rate 01/30/23 2307 (!) 130     Resp 01/30/23 2307 16     Temp 01/30/23 2307 98 F (36.7 C)     Temp Source 01/30/23 2307 Oral     SpO2 01/30/23 2307 99 %     Weight 01/30/23 2305 169 lb 12.1 oz (77 kg)     Height 01/30/23 2305 5\' 11"  (1.803 m)     Head Circumference --      Peak Flow --      Pain Score 01/30/23 2305 0     Pain Loc --      Pain Education --      Exclude from Growth Chart --     Most recent vital signs: Vitals:   01/30/23 2307  BP: (!) 122/96  Pulse: (!) 130  Resp: 16  Temp: 98 F (36.7 C)  SpO2: 99%     General: Awake, elevated mood walking in room CV:  Good peripheral perfusion.  Resp:  Normal effort.  Abd:  No distention.  Other:  Standing  walking with normal gait.  Mood is very elevated.  Appears to have flight of ideas and possibly responding to external stimuli.  He is normocephalic atraumatic.  He walks with normal and stable gait.  He appears anxious and his mood seems very labile.  He knows himself but is not able to demonstrate good orientation   ED Results / Procedures / Treatments   Labs (all labs ordered are listed, but only abnormal results are displayed) Labs Reviewed  COMPREHENSIVE METABOLIC PANEL - Abnormal; Notable for the following components:      Result Value   Glucose, Bld 114 (*)    Total Protein 8.4 (*)    ALT 63 (*)    All other components within normal limits  SALICYLATE LEVEL - Abnormal; Notable for the following components:   Salicylate Lvl <7.0 (*)    All other components within normal limits  ACETAMINOPHEN LEVEL - Abnormal; Notable for the following components:   Acetaminophen (Tylenol), Serum <10 (*)    All other components within  normal limits  LITHIUM LEVEL - Abnormal; Notable for the following components:   Lithium Lvl <0.06 (*)    All other components within normal limits  ETHANOL  CBC  URINE DRUG SCREEN, QUALITATIVE ([facility] ONLY)   Labs are notable for low lithium level.  Salicylate and acetaminophen are negative.  CBC normal.  EKG     RADIOLOGY     PROCEDURES:  Critical Care performed: No  Procedures   MEDICATIONS ORDERED IN ED: Medications  ARIPiprazole (ABILIFY) tablet 15 mg (15 mg Oral Given 01/30/23 2339)  divalproex (DEPAKOTE) DR tablet 500 mg (has no administration in time range)  metFORMIN (GLUCOPHAGE-XR) 24 hr tablet 1,000 mg (has no administration in time range)  metoprolol tartrate (LOPRESSOR) tablet 37.5 mg (37.5 mg Oral Given 01/30/23 2339)  OLANZapine (ZYPREXA) tablet 10 mg (10 mg Oral Given 01/30/23 2339)     IMPRESSION / MDM / ASSESSMENT AND PLAN / ED COURSE  I reviewed the triage vital signs and the nursing notes.                               Differential diagnosis includes, but is not limited to, likely underlying exacerbation of schizophrenia.  Behavior is very manic.  He does not have any obvious findings by clinical assessment history of vital signs to suggest an acute medical cause for his symptoms and he has a repetitive history of similar presentations.  I feel at this time that he may be compliant with medication treatment, and he is amenable to receiving oral medication at this time but also mentions he may possibly need "a shot".  He seems somewhat familiar with the process.  He is placed under IVC as he is obviously lacking capacity and appears to be suffering from acute mental illness possibly with the report of suicidal ideations made earlier.  There is certainly no evidence of traumatic injury or accompanying history of any sort of overdose or ingestion.  Consult to psychiatry has been placed  Patient's presentation is most consistent with acute complicated illness / injury requiring diagnostic workup.  ----------------------------------------- 11:50 PM on 01/30/2023 ----------------------------------------- Patient left his room and attempted to enter a confrontation with another patient in the department.  He had just taken his oral medication and I do not think it has had time to settle in, but security was able to break him and other patients apart and he is now back in his room compliant with care request, no evidence of injuries occurring as security quickly separated him.  I am hopeful that the oral medication he just took will begin to have effect soon, but if further escalation will certainly require additional IM medication  Ongoing care assigned to Dr. Larinda Buttery. Patient under IVC         FINAL CLINICAL IMPRESSION(S) / ED DIAGNOSES   Final diagnoses:  Schizophrenia, unspecified type (HCC)     Rx / DC Orders   ED Discharge Orders     None        Note:  This document was prepared using Dragon  voice recognition software and may include unintentional dictation errors.   Sharyn Creamer, MD 01/31/23 [phone]

## 2023-01-30 NOTE — ED Triage Notes (Signed)
Pt to ed from home via POV with his sister, who dropped him of at the front door and left. Pt is here for suicidal ideations. Pt has HX of same. Pt is alert and states "I am tired of this fucking shit and I wanna end it all".

## 2023-01-30 NOTE — ED Notes (Addendum)
Pt dressed out:  Black coat Black shoes Black pants Red shirt Black plastic watch White socks Red shorts Cell phone with charging cord and cracked screen Black long johns  Plaid boxers

## 2023-01-31 DIAGNOSIS — F200 Paranoid schizophrenia: Secondary | ICD-10-CM | POA: Diagnosis not present

## 2023-01-31 LAB — CBG MONITORING, ED: Glucose-Capillary: 123 mg/dL — ABNORMAL HIGH (ref 70–99)

## 2023-01-31 LAB — LITHIUM LEVEL: Lithium Lvl: <0.06 mmol/L — ABNORMAL LOW (ref 0.60–1.20)

## 2023-01-31 MED ORDER — LORAZEPAM 2 MG/ML IJ SOLN
2.0000 mg | Freq: Once | INTRAMUSCULAR | Status: AC
  Administered 2023-01-31: 2 mg via INTRAMUSCULAR
  Filled 2023-01-31: qty 1

## 2023-01-31 NOTE — Consult Note (Signed)
[facility] Psychiatric Consult Initial  Patient Name: .[name]  MRN: [id number]  DOB: 1990/12/05  Consult Order details:  Orders (From admission, onward)     Start     Ordered   01/30/23 2344  IP CONSULT TO PSYCHIATRY       Ordering Provider: Sharyn Creamer, MD  Provider:  (Not yet assigned)  Question Answer Comment  Place call to: psych md   Reason for Consult Consult   Diagnosis/Clinical Info for Consult: suicidal ideations      01/30/23 2343   01/30/23 2307  CONSULT TO CALL ACT TEAM       Ordering Provider: Sharyn Creamer, MD  Provider:  (Not yet assigned)  Question:  Reason for Consult?  Answer:  psych   01/30/23 2306             Mode of Visit: Tele-visit Virtual Statement:TELE PSYCHIATRY ATTESTATION & CONSENT As the provider for this telehealth consult, I attest that I verified the patient's identity using two separate identifiers, introduced myself to the patient, provided my credentials, disclosed my location, and performed this encounter via a HIPAA-compliant, real-time, face-to-face, two-way, interactive audio and video platform and with the full consent and agreement of the patient (or guardian as applicable.) Patient physical location: [facility]. Telehealth provider physical location: home office in state of [place].   Video start time:   Video end time:      Psychiatry Consult Evaluation  Service Date: January 31, 2023 LOS:  LOS: 0 days  Chief Complaint Psychosis  Primary Psychiatric Diagnoses   Schizophrenia, paranoid type (HCC)  Assessment  [name] is a 33 y.o. male admitted: Presented to the ED  01/30/2023 11:22 PM for acute psychosis. He carries the psychiatric diagnoses of schizophrenia.   His current presentation of psychosis is most consistent with schizophrenia. He meets criteria for inpatient  based on present condition.  Current outpatient psychotropic medications include clozaril and historically he has had a good response to these medications. He  was not compliant with medications prior to admission as evidenced by current psychotic state. On initial examination, patient was delusional and paranoid.   Per TTS,  Per triage note Pt to ed from home via POV with his sister, who dropped him of at the front door and left. Pt is here for suicidal ideations. Pt has HX of same. Pt is alert and states "I am tired of this fucking shit and I wanna end it all". During assessment patient appears alert and oriented x2, calm and cooperative, thoughts are disorganized and tangential. When patient arrived to the ED he could be seen standing and pacing in his room, pointing at places in his room and responding to internal stimuli. Patient reports "the hospital doesn't understand what I'm going through, I have these powers and magic and nobody understands." Patient reports AH "yeah I hear voices and when I'm walking outside near people I feel like they are talking about me, the voices tell me all types of things." Patient continues to report SI, denies HI   Diagnoses:  Active Hospital problems: Principal Problem:   Schizophrenia, paranoid type (HCC)    Plan   ## Psychiatric Medication Recommendations:  Restrart home meds one medication is reconciled  ## Medical Decision Making Capacity: Not specifically addressed in this encounter  ## Further Work-up:  -[name] was admitted to [facility] under the service of Sharyn Creamer, MD for Schizophrenia, paranoid type [facility]), crisis management, and stabilization. Routine  labs ordered, which include  Lab Orders         Comprehensive metabolic panel         Ethanol         Salicylate level         Acetaminophen level         cbc         Urine Drug Screen, Qualitative         Lithium level    Medication Management: Medications started  ARIPiprazole  15 mg Oral Daily   divalproex  500 mg Oral BH-q7a   metFORMIN  1,000 mg Oral QHS   metoprolol tartrate  37.5 mg Oral BID   OLANZapine   10 mg Oral QHS   Will maintain observation checks every 15 minutes for safety. Psychosocial education regarding relapse prevention and self-care; social and communication  Social work will consult with family for collateral information and discuss discharge and follow up plan.  ## Disposition:-- We recommend inpatient psychiatric hospitalization when medically cleared. Patient is under voluntary admission status at this time; please IVC if attempts to leave hospital.  ## Behavioral / Environmental: - No specific recommendations at this time.     ## Safety and Observation Level:  - Based on my clinical evaluation, I estimate the patient to be at moderate risk of self harm in the current setting. - At this time, we recommend  routine. This decision is based on my review of the chart including patient's history and current presentation, interview of the patient, mental status examination, and consideration of suicide risk including evaluating suicidal ideation, plan, intent, suicidal or self-harm behaviors, risk factors, and protective factors. This judgment is based on our ability to directly address suicide risk, implement suicide prevention strategies, and develop a safety plan while the patient is in the clinical setting. Please contact our team if there is a concern that risk level has changed.  CSSR Risk Category:C-SSRS RISK CATEGORY: High Risk  Suicide Risk Assessment: Patient has following modifiable risk factors for suicide: recklessness and medication noncompliance, which we are addressing by making inpatient. Patient has following non-modifiable or demographic risk factors for suicide: male gender and psychiatric hospitalization Patient has the following protective factors against suicide: Access to outpatient mental health care and Supportive family  Thank you for this consult request. Recommendations have been communicated to the primary team.  We will re at this time.   Greg Lesch, NP       History of Present Illness  Relevant Aspects of Hospital ED Course:  Admitted on 01/30/2023 for schizophrenia.   Patient Report:  Patient states that people are following him. He says he have abilities and no one believes him.  He can shift the air and control the animals.  He says the voices tell him what to do he says they are controlling his mind which messes up his equilibrium.  He reports that his medication is not working.  He says going to the community is like an Doctor, general practice. He still endorses SI while stating he can do magic.  He denies wanting to hurt anyone else. He report good sleep and appetite.     Review of Systems  All other systems reviewed and are negative.    Psychiatric and Social History  Psychiatric History:  Information collected from patient and chart review   Exam Findings  Physical Exam:  Vital Signs:  Temp:  [98 F (36.7 C)] 98 F (36.7 C) (01/18 2307) Pulse Rate:  [  130] 130 (01/18 2307) Resp:  [16] 16 (01/18 2307) BP: (122)/(96) 122/96 (01/18 2307) SpO2:  [99 %] 99 % (01/18 2307) Weight:  [77 kg] 77 kg (01/18 2305) Blood pressure (!) 122/96, pulse (!) 130, temperature 98 F (36.7 C), temperature source Oral, resp. rate 16, height 5\' 11"  (1.803 m), weight 77 kg, SpO2 99%. Body mass index is 23.68 kg/m.  Physical Exam Vitals and nursing note reviewed.  Constitutional:      Appearance: Normal appearance.  HENT:     Head: Normocephalic and atraumatic.     Nose: Nose normal.     Mouth/Throat:     Mouth: Mucous membranes are dry.  Eyes:     Pupils: Pupils are equal, round, and reactive to light.  Pulmonary:     Effort: Pulmonary effort is normal.  Musculoskeletal:        General: Normal range of motion.     Cervical back: Normal range of motion.  Neurological:     Mental Status: He is alert and oriented to person, place, and time.  Psychiatric:        Attention and Perception: Attention normal. He perceives auditory and visual  hallucinations.        Mood and Affect: Affect is inappropriate.        Speech: Speech is tangential.        Behavior: Behavior is cooperative.        Thought Content: Thought content is paranoid and delusional.        Cognition and Memory: Cognition is impaired. Memory is impaired.        Judgment: Judgment is impulsive and inappropriate.     Mental Status Exam: General Appearance: Disheveled  Orientation:  Negative  Memory:  Immediate;   Poor  Concentration:  Concentration: Poor  Recall:  Poor  Attention  Poor  Eye Contact:  Minimal  Speech:  Pressured  Language:  Fair  Volume:  Normal  Mood: full range  Affect:  Non-Congruent  Thought Process:  Disorganized  Thought Content:  Illogical, Delusions, and Hallucinations: Auditory  Suicidal Thoughts:  No  Homicidal Thoughts:  No  Judgement:  Impaired  Insight:  Good  Psychomotor Activity:  Normal  Akathisia:  NA  Fund of Knowledge:  NA      Assets:  Architect Housing  Cognition:  WNL  ADL's:  Intact  AIMS (if indicated):        Other History   These have been pulled in through the EMR, reviewed, and updated if appropriate.  Family History:  The patient's family history is not on file.  Medical History: Past Medical History:  Diagnosis Date   Schizo affective schizophrenia (HCC)    Schizophrenia (HCC)     Surgical History: History reviewed. No pertinent surgical history.   Medications:   Current Facility-Administered Medications:    ARIPiprazole (ABILIFY) tablet 15 mg, 15 mg, Oral, Daily, Quale, Mark, MD, 15 mg at 01/30/23 2339   divalproex (DEPAKOTE) DR tablet 500 mg, 500 mg, Oral, BH-q7a, Sharyn Creamer, MD   metFORMIN (GLUCOPHAGE-XR) 24 hr tablet 1,000 mg, 1,000 mg, Oral, QHS, Quale, Mark, MD, 1,000 mg at 01/31/23 0127   metoprolol tartrate (LOPRESSOR) tablet 37.5 mg, 37.5 mg, Oral, BID, Sharyn Creamer, MD, 37.5 mg at 01/30/23 2339   OLANZapine (ZYPREXA) tablet 10 mg,  10 mg, Oral, QHS, Sharyn Creamer, MD, 10 mg at 01/30/23 2339  Current Outpatient Medications:    ARIPiprazole (ABILIFY) 15 MG tablet, Take 1 tablet (15  mg total) by mouth daily. (Patient not taking: Reported on 05/18/2022), Disp: 30 tablet, Rfl: 1   Cholecalciferol (VITAMIN D3) 50 MCG (2000 UT) TABS, Take 1 tablet by mouth daily. (Patient not taking: Reported on 11/07/2022), Disp: , Rfl:    clozapine (CLOZARIL) 200 MG tablet, Take 400 mg by mouth at bedtime. (Patient not taking: Reported on 11/07/2022), Disp: , Rfl:    cloZAPine (CLOZARIL) 25 MG tablet, Take 50 mg by mouth at bedtime. (Patient not taking: Reported on 05/18/2022), Disp: , Rfl:    divalproex (DEPAKOTE) 500 MG DR tablet, Take 1 tablet (500 mg total) by mouth every morning. (Patient not taking: Reported on 05/18/2022), Disp: 30 tablet, Rfl: 1   ELIQUIS 5 MG TABS tablet, Take 5 mg by mouth 2 (two) times daily. (Patient not taking: Reported on 11/07/2022), Disp: , Rfl:    lithium 600 MG capsule, Take 1,200 mg by mouth at bedtime. (Patient not taking: Reported on 11/07/2022), Disp: , Rfl:    metFORMIN (GLUCOPHAGE-XR) 500 MG 24 hr tablet, Take 1,000 mg by mouth at bedtime. (Patient not taking: Reported on 11/07/2022), Disp: , Rfl:    metoprolol tartrate (LOPRESSOR) 25 MG tablet, Take 37.5 mg by mouth 2 (two) times daily. (Patient not taking: Reported on 11/07/2022), Disp: , Rfl:    nicotine polacrilex (COMMIT) 2 MG lozenge, Take 2 mg by mouth as needed., Disp: , Rfl:    OLANZapine (ZYPREXA) 10 MG tablet, Take 10 mg by mouth at bedtime. (Patient not taking: Reported on 11/07/2022), Disp: , Rfl:    senna (SENOKOT) 8.6 MG TABS tablet, Take 2 tablets by mouth at bedtime. (Patient not taking: Reported on 11/07/2022), Disp: , Rfl:    ZYPREXA RELPREVV 405 MG SUSR, Inject into the muscle. (Patient not taking: Reported on 05/18/2022), Disp: , Rfl:   Allergies: Allergies  Allergen Reactions   Ibuprofen Swelling    Tongue swelling   Risperidone And Related  Other (See Comments) and Swelling    gynecomastia gynecomastia    Ziprasidone Swelling    Tongue swells   Benztropine Other (See Comments)    Causes confusion, depression, and delusions   Quetiapine Other (See Comments)    Depression, suicidality, adverse effect: seizures     Coda Mathey Damaris Hippo, NP

## 2023-01-31 NOTE — ED Notes (Signed)
Pt currently asking for meds to help stay calm.

## 2023-01-31 NOTE — ED Notes (Signed)
Breakfast and new pants provide, however pt refused shower stating "I showered before I came"
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
## 2023-01-31 NOTE — ED Notes (Signed)
 Dinner tray provided; Pt tolerated well; Waste disposed of properly.

## 2023-01-31 NOTE — ED Notes (Signed)
Patients mother updated as to patient's status.

## 2023-01-31 NOTE — ED Notes (Signed)
IVC/Consult Completed/Accepted to [facility] this Am

## 2023-01-31 NOTE — ED Provider Notes (Signed)
Emergency Medicine Observation Re-evaluation Note  [name] is a 33 y.o. male, seen on rounds today.  Pt initially presented to the ED for complaints of Psychiatric Evaluation  Currently, the patient is is no acute distress. Denies any concerns at this time.  Physical Exam  Blood pressure (!) 93/54, pulse 60, temperature 98.5 F (36.9 C), temperature source Oral, resp. rate 17, height 5\' 11"  (1.803 m), weight 77 kg, SpO2 97%.  Physical Exam: General: No apparent distress Psych: Resting comfortably     ED Course / MDM     I have reviewed the labs performed to date as well as medications administered while in observation.  Recent changes in the last 24 hours include: No acute events overnight.  Plan   Current plan: Patient awaiting inpatient psychiatric hospitalization. Patient is under full IVC at this time.    Corena Herter, MD 01/31/23 2052

## 2023-01-31 NOTE — BH Assessment (Signed)
Per [facility] Ever[facility] Pacific), patient to be referred out of system.  Referral information for Psychiatric Hospitalization faxed to;   [facility] [phone]- [phone]) No available beds  [facility] [phone]- [phone]),   [facility] [phone]),  [address] [phone]),    [facility] [phone] -or- [phone]),   [facility] [phone])  [facility] [phone]).  [facility] [phone])

## 2023-01-31 NOTE — BH Assessment (Signed)
PATIENT BED AVAILABLE AFTER 8AM ON 01/31/23  Patient has been accepted to [facility].  Patient assigned to [facility] Accepting physician is Dr. Loni Beckwith.  Call report to [phone] option 2.  Representative was [place].   ER Staff is aware of it:  [facility] ER Secretary  Dr. Fanny Bien, ER MD  [name] Patient's Nurse

## 2023-01-31 NOTE — ED Notes (Signed)
IVC, pt accepted to [facility] unable to transport on 1.19.25 due to [facility] plan to transport on 1.20.25.

## 2023-01-31 NOTE — ED Notes (Signed)
Pt received lunch tray and drink.  

## 2023-01-31 NOTE — BH Assessment (Addendum)
**   Received call from [facility] at [facility] stating they are not able to take patient on today due to pending discharges but will be able to admit on tomm 02/01/23. **

## 2023-02-01 DIAGNOSIS — F200 Paranoid schizophrenia: Secondary | ICD-10-CM | POA: Diagnosis not present

## 2023-02-01 LAB — CBG MONITORING, ED: Glucose-Capillary: 96 mg/dL (ref 70–99)

## 2023-02-01 NOTE — ED Notes (Signed)
Hospital meal provided, pt tolerated w/o complaints.  Waste discarded appropriately.  

## 2023-02-01 NOTE — ED Notes (Signed)
EMTALA reviewed by this RN.  

## 2023-02-01 NOTE — ED Notes (Signed)
Belongings bag 1/1 sent with patient at time of discharge

## 2023-02-01 NOTE — ED Provider Notes (Signed)
Patient currently under IVC.  Awaiting psychiatric admission  Vitals:   01/31/23 2023 01/31/23 2140  BP: (!) 93/54 (!) 93/54  Pulse: 60 60  Resp: 17   Temp: 98.5 F (36.9 C)   SpO2: 97%    Nursing notes indicate the patient's family has been updated.  Patient has been calm throughout the shift and no distress.  Ate dinner without difficulty   Sharyn Creamer, MD 02/01/23 [phone]

## 2023-02-01 NOTE — ED Notes (Signed)
This RN left call back number on [facility] answering machine to give report on patient.
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
## 2023-02-01 NOTE — ED Notes (Signed)
IVC/ pt accepted to [facility], will be transported on 02/01/23.

## 2023-02-12 ENCOUNTER — Emergency Department
Admission: EM | Admit: 2023-02-12 | Discharge: 2023-02-14 | Disposition: A | Discharge status: Other Acute Inpatient Hospital | Payer: Medicare Other | Attending: Student in an Organized Health Care Education/Training Program | Admitting: Student in an Organized Health Care Education/Training Program

## 2023-02-12 DIAGNOSIS — F209 Schizophrenia, unspecified: Secondary | ICD-10-CM | POA: Diagnosis present

## 2023-02-12 DIAGNOSIS — F200 Paranoid schizophrenia: Secondary | ICD-10-CM | POA: Diagnosis not present

## 2023-02-12 DIAGNOSIS — F29 Unspecified psychosis not due to a substance or known physiological condition: Secondary | ICD-10-CM | POA: Insufficient documentation

## 2023-02-12 DIAGNOSIS — R45851 Suicidal ideations: Secondary | ICD-10-CM | POA: Insufficient documentation

## 2023-02-12 DIAGNOSIS — F19950 Other psychoactive substance use, unspecified with psychoactive substance-induced psychotic disorder with delusions: Secondary | ICD-10-CM | POA: Diagnosis present

## 2023-02-12 DIAGNOSIS — R4182 Altered mental status, unspecified: Secondary | ICD-10-CM | POA: Diagnosis not present

## 2023-02-12 LAB — CBC
HCT: 43.5 % (ref 39.0–52.0)
Hemoglobin: 14.6 g/dL (ref 13.0–17.0)
MCH: 28 pg (ref 26.0–34.0)
MCHC: 33.6 g/dL (ref 30.0–36.0)
MCV: 83.5 fL (ref 80.0–100.0)
Platelets: 325 10*3/uL (ref 150–400)
RBC: 5.21 MIL/uL (ref 4.22–5.81)
RDW: 12.9 % (ref 11.5–15.5)
WBC: 9.1 10*3/uL (ref 4.0–10.5)
nRBC: 0 % (ref 0.0–0.2)

## 2023-02-12 LAB — ACETAMINOPHEN LEVEL: Acetaminophen (Tylenol), Serum: <10 ug/mL — ABNORMAL LOW (ref 10–30)

## 2023-02-12 LAB — COMPREHENSIVE METABOLIC PANEL
ALT: 21 U/L (ref 0–44)
AST: 23 U/L (ref 15–41)
Albumin: 4.2 g/dL (ref 3.5–5.0)
Alkaline Phosphatase: 59 U/L (ref 38–126)
Anion gap: 17 — ABNORMAL HIGH (ref 5–15)
BUN: 21 mg/dL — ABNORMAL HIGH (ref 6–20)
CO2: 21 mmol/L — ABNORMAL LOW (ref 22–32)
Calcium: 9.1 mg/dL (ref 8.9–10.3)
Chloride: 101 mmol/L (ref 98–111)
Creatinine, Ser: 0.88 mg/dL (ref 0.61–1.24)
GFR, Estimated: >60 mL/min (ref >60)
Glucose, Bld: 73 mg/dL (ref 70–99)
Potassium: 4.1 mmol/L (ref 3.5–5.1)
Sodium: 139 mmol/L (ref 135–145)
Total Bilirubin: 1.3 mg/dL — ABNORMAL HIGH (ref 0.0–1.2)
Total Protein: 8.2 g/dL — ABNORMAL HIGH (ref 6.5–8.1)

## 2023-02-12 LAB — SALICYLATE LEVEL: Salicylate Lvl: <7 mg/dL — ABNORMAL LOW (ref 7.0–30.0)

## 2023-02-12 LAB — ETHANOL: Alcohol, Ethyl (B): <10 mg/dL (ref <10)

## 2023-02-12 NOTE — Consult Note (Addendum)
 [facility] Psychiatric Consult Initial  Patient Name: .[name]  MRN: [id number]  DOB: [birth year]  Consult Order details:  Orders (From admission, onward)     Start     Ordered   02/12/23 1209  IP CONSULT TO PSYCHIATRY       Ordering Provider: Willy Eddy, MD  Provider:  (Not yet assigned)  Question Answer Comment  Place call to: ed   Reason for Consult Admit      02/12/23 1208   02/12/23 1149  CONSULT TO CALL ACT TEAM       Ordering Provider: Willy Eddy, MD  Provider:  (Not yet assigned)  Question:  Reason for Consult?  Answer:  ed   02/12/23 1148   02/12/23 1149  CONSULT TO CALL ACT TEAM       Ordering Provider: Willy Eddy, MD  Provider:  (Not yet assigned)  Question:  Reason for Consult?  Answer:  Psych consult   02/12/23 [email or web address]             Mode of Visit: Tele-visit Virtual Statement:TELE PSYCHIATRY ATTESTATION & CONSENT As the provider for this telehealth consult, I attest that I verified the patient's identity using two separate identifiers, introduced myself to the patient, provided my credentials, disclosed my location, and performed this encounter via a HIPAA-compliant, real-time, face-to-face, two-way, interactive audio and video platform and with the full consent and agreement of the patient (or guardian as applicable.) Patient physical location: [facility] ED. Telehealth provider physical location: home office in state of [place].   Video start time: 2:15pm Video end time: 2:45pm    Psychiatry Consult Evaluation  Service Date: February 12, 2023 LOS:  LOS: 0 days  Chief Complaint "I just want to try to make a way for myself"  Primary Psychiatric Diagnoses  Schizophrenia, paranoid type   Assessment  [name] is a 33 y.o. male presenting to [facility] ED under IVC on 02/12/2023 11:27 AM for psychosis. Patient reports that he is experiencing auditory  hallucinations that tell him to go left and right or get out of here right now". Patient reports voices tell him that God is going to jump on him if he does not listen. Voices also tell him to blow the whole city up and knock on random house doors. He reports a psychiatric history of multiple psychiatric illnesses. Per his record, patient has a psychiatric history of schizoaffective disorder, schizophrenia, paranoid type, polysubstance use disorder, suicidal ideations, and self inflicted laceration to right wrist. Per his record, patient presented to the ED via police transport due to "hyperreligiousity, suicidal ideation with a plan to build a bomb and light himself on fire". Patient reports recently being hospitalized for psychiatry and receiving a LAI. Per staff report, patient was hospitalized last at [facility] after being recommended for inpatient psychiatry at [facility] ED on 01/31/23. Patient unable to identify his current medication regimen, otherwise. He is noted with labile mood, laughing initially and crying during the evaluation.   Please see plan below for detailed recommendations.   Diagnoses:  Active Hospital problems: Active Problems:   Schizophrenia, paranoid (HCC)    Plan   ## Psychiatric Medication Recommendations:  Awaiting collateral contact to confirm current medication regimen.  ## Medical Decision Making Capacity: Not specifically addressed in this encounter  ## Further Work-up:  -EKG -- Pertinent labwork reviewed earlier this admission includes: CBC, CMP   ## Disposition:-- We recommend inpatient psychiatric hospitalization after medical hospitalization. Patient has been involuntarily committed on  02/12/23.   ## Behavioral / Environmental: - No specific recommendations at this time.     ## Safety and Observation Level:  - Based on my clinical evaluation, I estimate the patient to be at low risk of self harm in the current setting. - At this time, we recommend   routine. This decision is based on my review of the chart including patient's history and current presentation, interview of the patient, mental status examination, and consideration of suicide risk including evaluating suicidal ideation, plan, intent, suicidal or self-harm behaviors, risk factors, and protective factors. This judgment is based on our ability to directly address suicide risk, implement suicide prevention strategies, and develop a safety plan while the patient is in the clinical setting. Please contact our team if there is a concern that risk level has changed.  CSSR Risk Category:C-SSRS RISK CATEGORY: Error: Q3, 4, or 5 should not be populated when Q2 is No  Suicide Risk Assessment: Patient has following modifiable risk factors for suicide: recklessness, which we are addressing by inpatient psychiatry. Patient has following non-modifiable or demographic risk factors for suicide: male gender Patient has the following protective factors against suicide: Supportive family  Thank you for this consult request. Recommendations have been communicated to the primary team.  We will recommend for inpatient psychiatry at this time.   Mcneil Sober, NP       History of Present Illness  Relevant Aspects of Hospital ED Course:  Admitted on 02/12/2023 for psychosis and SI.   Patient Report:  "I just want to try to make a way for myself". "I hear somebody talking saying go left and go right". "Blow this whole city up"  Psych ROS:  Depression: n/a Anxiety:  yes Mania (lifetime and current): yes Psychosis: (lifetime and current): yes  Collateral information:  Aleen Campi at [phone] on 02/12/23 without success  Review of Systems  Psychiatric/Behavioral:  Positive for hallucinations and substance abuse. The patient is nervous/anxious.   All other systems reviewed and are negative.    Psychiatric and Social History  Psychiatric History:  Information collected from Patient, ED  treatment team  Prev Dx/Sx: Schizophrenia, paranoid type, schizoaffective d/o, polysubstance use disorder, SIB, SI Current Psych Provider: patient unable to identify Home Meds (current): patient unable to identify Previous Med Trials: per patient record, Abilify, Clozaril, Depakote, Lithium, Zyprexa Therapy: unable to identify  Prior Psych Hospitalization: yes  Prior Self Harm: yes Prior Violence:   Family Psych History: mother (diagnosis unknown) Family Hx suicide: denies  Social History:  Developmental Hx: UTD Educational Hx: UTD Occupational Hx: UTD Legal Hx: denies Living Situation: denies homelessness Spiritual Hx: UTD Access to weapons/lethal means: none reported   Substance History Alcohol: denies  Type of alcohol n/a Last Drink n/a Number of drinks per day n/a History of alcohol withdrawal seizures denies History of DT's denies Tobacco: denies Illicit drugs: crack cocaine and occasional marijuana Prescription drug abuse: denies Rehab hx: n/a  Exam Findings  Physical Exam: no abnormal movements noted Vital Signs:  Temp:  [98.2 F (36.8 C)-98.9 F (37.2 C)] 98.2 F (36.8 C) (01/31 1139) Pulse Rate:  [84-103] 84 (01/31 1139) Resp:  [18-20] 18 (01/31 1139) BP: (113-120)/(74-87) 113/74 (01/31 1139) SpO2:  [97 %-98 %] 98 % (01/31 1139) Blood pressure 113/74, pulse 84, temperature 98.2 F (36.8 C), temperature source Oral, resp. rate 18, height 5\' 11"  (1.803 m), SpO2 98%. Body mass index is 23.68 kg/m.  Physical Exam Vitals and nursing note reviewed.  Constitutional:  Appearance: He is normal weight.  Neurological:     General: No focal deficit present.     Mental Status: He is alert and oriented to person, place, and time.     Mental Status Exam: General Appearance: Disheveled  Orientation:  Full (Time, Place, and Person)  Memory:  Immediate;   Good Recent;   Good Remote;   Good  Concentration:  Concentration: Poor and Attention Span: Poor   Recall:  Good  Attention  Poor  Eye Contact:  Good  Speech:  Clear and Coherent  Language:  Good  Volume:  Normal  Mood: labile  Affect:  Congruent  Thought Process:  Disorganized  Thought Content:  Hallucinations: Auditory  Suicidal Thoughts:  Yes.  with intent/plan  Homicidal Thoughts:  No  Judgement:  Impaired  Insight:  Lacking  Psychomotor Activity:  Normal  Akathisia:  No  Fund of Knowledge:  Fair      Assets:  Communication Skills  Cognition:  WNL  ADL's:  Intact  AIMS (if indicated):      Other History   These have been pulled in through the EMR, reviewed, and updated if appropriate.  Family History:  The patient's family history is not on file.  Medical History: Past Medical History:  Diagnosis Date   Schizo affective schizophrenia (HCC)    Schizophrenia (HCC)     Surgical History: No past surgical history on file.   Medications:  No current facility-administered medications for this encounter.  Current Outpatient Medications:    ARIPiprazole (ABILIFY) 15 MG tablet, Take 1 tablet (15 mg total) by mouth daily. (Patient not taking: Reported on 05/18/2022), Disp: 30 tablet, Rfl: 1   Cholecalciferol (VITAMIN D3) 50 MCG (2000 UT) TABS, Take 1 tablet by mouth daily. (Patient not taking: Reported on 11/07/2022), Disp: , Rfl:    clozapine (CLOZARIL) 200 MG tablet, Take 400 mg by mouth at bedtime. (Patient not taking: Reported on 11/07/2022), Disp: , Rfl:    cloZAPine (CLOZARIL) 25 MG tablet, Take 50 mg by mouth at bedtime. (Patient not taking: Reported on 05/18/2022), Disp: , Rfl:    divalproex (DEPAKOTE) 500 MG DR tablet, Take 1 tablet (500 mg total) by mouth every morning. (Patient not taking: Reported on 05/18/2022), Disp: 30 tablet, Rfl: 1   ELIQUIS 5 MG TABS tablet, Take 5 mg by mouth 2 (two) times daily. (Patient not taking: Reported on 11/07/2022), Disp: , Rfl:    lithium 600 MG capsule, Take 1,200 mg by mouth at bedtime. (Patient not taking: Reported on  11/07/2022), Disp: , Rfl:    metFORMIN (GLUCOPHAGE-XR) 500 MG 24 hr tablet, Take 1,000 mg by mouth at bedtime. (Patient not taking: Reported on 11/07/2022), Disp: , Rfl:    metoprolol tartrate (LOPRESSOR) 25 MG tablet, Take 37.5 mg by mouth 2 (two) times daily. (Patient not taking: Reported on 11/07/2022), Disp: , Rfl:    nicotine polacrilex (COMMIT) 2 MG lozenge, Take 2 mg by mouth as needed., Disp: , Rfl:    OLANZapine (ZYPREXA) 10 MG tablet, Take 10 mg by mouth at bedtime. (Patient not taking: Reported on 11/07/2022), Disp: , Rfl:    senna (SENOKOT) 8.6 MG TABS tablet, Take 2 tablets by mouth at bedtime. (Patient not taking: Reported on 11/07/2022), Disp: , Rfl:    ZYPREXA RELPREVV 405 MG SUSR, Inject into the muscle. (Patient not taking: Reported on 05/18/2022), Disp: , Rfl:   Allergies: Allergies  Allergen Reactions   Ibuprofen Swelling    Tongue swelling   Risperidone And  Related Other (See Comments) and Swelling    gynecomastia gynecomastia    Ziprasidone Swelling    Tongue swells   Benztropine Other (See Comments)    Causes confusion, depression, and delusions   Quetiapine Other (See Comments)    Depression, suicidality, adverse effect: seizures     Mcneil Sober, NP

## 2023-02-12 NOTE — ED Notes (Signed)
ivc/consult done/recommended for inpatient psychiatric hospitalization.

## 2023-02-12 NOTE — ED Notes (Signed)
 IVC PENDING  CONSULT ?

## 2023-02-12 NOTE — ED Notes (Signed)
This tech attempted to complete EKG. Pt refused to have the EKG done by this tech. Will attempt to get again later.

## 2023-02-12 NOTE — ED Triage Notes (Addendum)
Patient brought in by EMS from the police station. Patient states he's a traveling man, and he's in space right now, and out of his mind. He is able to tell me he has a history of heart bipolar, hyper-religious disorder, personality disorder, high IQ test score, schizophrenia. Patient states that he's supposed to be taking medicine, but he hasn't. Denies ETOH. Patient endorses crack use six hours ago, and threw his crack pipe in our trash can. Patient states he does want to hurt himself but he doesn't. He also says he wants to blow the world up.

## 2023-02-12 NOTE — ED Provider Notes (Signed)
[facility] Provider Note    Event Date/Time   First MD Initiated Contact with Patient 02/12/23 1132     (approximate)   History   Psychiatric Evaluation   HPI  [name] is a 33 y.o. male history of bipolar disorder not currently on medications as well as history of polysubstance abuse and admits to recently using crack cocaine presents to the ER under police custody due to altered mental status hyperreligiosity suicidal ideation with plan to build a bomb and light himself on fire he states.  Patient told nurse that he was traveling through space and needed help with his condition.     Physical Exam   Triage Vital Signs: ED Triage Vitals  Encounter Vitals Group     BP 02/12/23 0523 120/87     Systolic BP Percentile --      Diastolic BP Percentile --      Pulse Rate 02/12/23 0523 (!) 103     Resp 02/12/23 0523 20     Temp 02/12/23 0523 98.9 F (37.2 C)     Temp Source 02/12/23 0523 Oral     SpO2 02/12/23 0523 97 %     Weight --      Height 02/12/23 0517 5\' 11"  (1.803 m)     Head Circumference --      Peak Flow --      Pain Score --      Pain Loc --      Pain Education --      Exclude from Growth Chart --     Most recent vital signs: Vitals:   02/12/23 0523 02/12/23 1139  BP: 120/87 113/74  Pulse: (!) 103 84  Resp: 20 18  Temp: 98.9 F (37.2 C) 98.2 F (36.8 C)  SpO2: 97% 98%     Constitutional: Alert  Eyes: Conjunctivae are normal.  Head: Atraumatic. Nose: No congestion/rhinnorhea. Mouth/Throat: Mucous membranes are moist.   Neck: Painless ROM.  Cardiovascular:   Good peripheral circulation. Respiratory: Normal respiratory effort.  No retractions.  Gastrointestinal: Soft and nontender.  Musculoskeletal:  no deformity Neurologic:  MAE spontaneously. No gross focal neurologic deficits are appreciated.  Skin:  Skin is warm, dry and intact. No rash noted. Psychiatric: odd    ED Results / Procedures / Treatments    Labs (all labs ordered are listed, but only abnormal results are displayed) Labs Reviewed  COMPREHENSIVE METABOLIC PANEL - Abnormal; Notable for the following components:      Result Value   CO2 21 (*)    BUN 21 (*)    Total Protein 8.2 (*)    Total Bilirubin 1.3 (*)    Anion gap 17 (*)    All other components within normal limits  SALICYLATE LEVEL - Abnormal; Notable for the following components:   Salicylate Lvl <7.0 (*)    All other components within normal limits  ACETAMINOPHEN LEVEL - Abnormal; Notable for the following components:   Acetaminophen (Tylenol), Serum <10 (*)    All other components within normal limits  ETHANOL  CBC  URINE DRUG SCREEN, QUALITATIVE ([facility] ONLY)     EKG  .   PROCEDURES:  Critical Care performed:   Procedures   MEDICATIONS ORDERED IN ED: Medications - No data to display   IMPRESSION / MDM / ASSESSMENT AND PLAN / ED COURSE  I reviewed the triage vital signs and the nursing notes.  Differential diagnosis includes, but is not limited to, Psychosis, delirium, medication effect, noncompliance, polysubstance abuse, Si, Hi, depression  Patient presenting to the ER with acute psychosis likely secondary to drug use but has history of bipolar disorder and off of his meds.  Laboratory testing was ordered to evaluation for underlying electrolyte derangement or signs of underlying organic pathology to explain today's presentation.  Based on history and physical and laboratory evaluation, it appears that the patient's presentation is 2/2 underlying psychiatric disorder and will require further evaluation and management by inpatient psychiatry.  Patient was  made an IVC due to paranoia bizarre behavior and suicidal ideation..  Disposition pending psychiatric evaluation.        FINAL CLINICAL IMPRESSION(S) / ED DIAGNOSES   Final diagnoses:  Psychosis, unspecified psychosis type (HCC)     Rx / DC Orders   ED  Discharge Orders     None        Note:  This document was prepared using Dragon voice recognition software and may include unintentional dictation errors.    Willy Eddy, MD 02/12/23 [phone]

## 2023-02-12 NOTE — BH Assessment (Signed)
Comprehensive Clinical Assessment (CCA) Note  02/12/2023 [name] [id number]  Chief Complaint:  Chief Complaint  Patient presents with   Psychiatric Evaluation   Visit Diagnosis: Schizophrenia   [name] is a 33 year old male who presents to the ER by law enforcement. It's unclear if he contacted law enforcement or they saw him in public and brought him to the ER. Patient is currently off his medications and using drugs. He reports of having A/H, with commands but not to harm anyone in particular but the voices are telling him to blow up things. He's delusional and currently he's not at his baseline, in comparison to previous ER visits, when he is doing well.  During the interview, he was cooperative and pleasant. He was able to provide appropriate answers to majority of the questions. He admits to the use of cocaine and cannabis.   CCA Screening, Triage and Referral (STR)  Patient Reported Information How did you hear about [place]? Self  What Is the Reason for Your Visit/Call Today? Brought to the ER by law enforcement.  How Long Has This Been Causing You Problems? 1 wk - 1 month  What Do You Feel Would Help You the Most Today? Treatment for Depression or other mood problem   Have You Recently Had Any Thoughts About Hurting Yourself? No  Are You Planning to Commit Suicide/Harm Yourself At This time? No   Flowsheet Row ED from 02/12/2023 in [facility] Emergency Department at [facility] ED from 01/30/2023 in [facility] Emergency Department at [facility] ED from 01/16/2023 in [facility] Emergency Department at [facility]  C-SSRS RISK CATEGORY Error: Q3, 4, or 5 should not be populated when Q2 is No High Risk High Risk       Have you Recently Had Thoughts About Hurting Someone [name]? No  Are You Planning to Harm Someone at This Time? No  Explanation: No data recorded  Have You Used Any Alcohol or Drugs in the Past 24 Hours? Yes  How Long Ago  Did You Use Drugs or Alcohol? Cocaine and Cannabis  What Did You Use and How Much? Unable to quantify   Do You Currently Have a Therapist/Psychiatrist? No  Name of Therapist/Psychiatrist:    Have You Been Recently Discharged From Any Office Practice or Programs? No  Explanation of Discharge From Practice/Program: No data recorded    CCA Screening Triage Referral Assessment Type of Contact: Face-to-Face  Telemedicine Service Delivery:   Is this Initial or Reassessment?   Date Telepsych consult ordered in [facility]:    Time Telepsych consult ordered in [facility]:    Location of Assessment: [facility] ED  Provider Location: [facility] ED   Collateral Involvement: None provided   Does Patient Have a Court Appointed Legal Guardian? No  Legal Guardian Contact Information: No data recorded Copy of Legal Guardianship Form: No data recorded Legal Guardian Notified of Arrival: No data recorded Legal Guardian Notified of Pending Discharge: No data recorded If Minor and Not Living with Parent(s), Who has Custody? No data recorded Is CPS involved or ever been involved? Never  Is APS involved or ever been involved? Never   Patient Determined To Be At Risk for Harm To Self or Others Based on Review of Patient Reported Information or Presenting Complaint? No  Method: Plan without intent  Availability of Means: No data recorded Intent: Vague intent or NA  Notification Required: No need or identified person  Additional Information for Danger to Others Potential: No data recorded  Additional Comments for Danger to Others Potential: No data recorded Are There Guns or Other Weapons in Your Home? No  Types of Guns/Weapons: No data recorded Are These Weapons Safely Secured?                            No  Who Could Verify You Are Able To Have These Secured: No data recorded Do You Have any Outstanding Charges, Pending Court Dates, Parole/Probation? No data recorded Contacted To Inform of Risk of Harm To  Self or Others: No data recorded   Does Patient Present under Involuntary Commitment? No    [place] of Residence: [place]   Patient Currently Receiving the Following Services: Medication Management   Determination of Need: Emergent (2 hours)   Options For Referral: ED Visit     CCA Biopsychosocial Patient Reported Schizophrenia/Schizoaffective Diagnosis in Past: Yes   Strengths: Able to take care of his ADL's, some insight on his mental illness, & polite.   Mental Health Symptoms Depression:  Change in energy/activity   Duration of Depressive symptoms:    Mania:  Change in energy/activity; Racing thoughts   Anxiety:   Difficulty concentrating; Restlessness   Psychosis:  Hallucinations   Duration of Psychotic symptoms: Duration of Psychotic Symptoms: N/A   Trauma:  N/A   Obsessions:  N/A   Compulsions:  N/A   Inattention:  N/A   Hyperactivity/Impulsivity:  N/A   Oppositional/Defiant Behaviors:  N/A   Emotional Irregularity:  N/A   Other Mood/Personality Symptoms:  No data recorded   Mental Status Exam Appearance and self-care  Stature:  Average   Weight:  Average weight   Clothing:  Disheveled   Grooming:  Normal   Cosmetic use:  None   Posture/gait:  Normal   Motor activity:  -- (Within normal range)   Sensorium  Attention:  Normal   Concentration:  Normal   Orientation:  X5   Recall/memory:  Normal   Affect and Mood  Affect:  Full Range   Mood:  Anxious   Relating  Eye contact:  Normal   Facial expression:  Responsive   Attitude toward examiner:  Cooperative   Thought and Language  Speech flow: Clear and Coherent   Thought content:  Appropriate to Mood and Circumstances   Preoccupation:  Obsessions; Other (Comment)   Hallucinations:  Auditory   Organization:  Loose; Circumstantial   Company secretary of Knowledge:  Fair   Intelligence:  Average   Abstraction:  Overly abstract   Judgement:   Impaired   Reality Testing:  Distorted   Insight:  Poor   Decision Making:  Impulsive   Social Functioning  Social Maturity:  Impulsive   Social Judgement:  Chemical engineer"; Heedless   Stress  Stressors:  -- (Mental Health)   Coping Ability:  Normal   Skill Deficits:  None   Supports:  Family     Religion: Religion/Spirituality Are You A Religious Person?: No  Leisure/Recreation: Leisure / Recreation Do You Have Hobbies?: No  Exercise/Diet: Exercise/Diet Do You Exercise?: No Have You Gained or Lost A Significant Amount of Weight in the Past Six Months?: No Do You Follow a Special Diet?: No Do You Have Any Trouble Sleeping?: No   CCA Employment/Education Employment/Work Situation: Employment / Work Situation Employment Situation: On disability Why is Patient on Disability: Mental Health How Long has Patient Been on Disability: Unable to quantify Has Patient ever Been in the [employer]?: No  Education: Education Is Patient Currently Attending School?: No Did You Product manager?: No Did You Have An Individualized Education Program (IIEP): No Did You Have Any Difficulty At School?: No Patient's Education Has Been Impacted by Current Illness: No   CCA Family/Childhood History Family and Relationship History: Family history Marital status: Single Does patient have children?: No  Childhood History:  Childhood History By whom was/is the patient raised?: Mother Did patient suffer any verbal/emotional/physical/sexual abuse as a child?: No Did patient suffer from severe childhood neglect?: No Has patient ever been sexually abused/assaulted/raped as an adolescent or adult?: No Was the patient ever a victim of a crime or a disaster?: No Witnessed domestic violence?: No Has patient been affected by domestic violence as an adult?: No       CCA Substance Use Alcohol/Drug Use: Alcohol / Drug Use Pain Medications: See MAR Prescriptions: See MAR Over the  Counter: See MAR History of alcohol / drug use?: No history of alcohol / drug abuse Longest period of sobriety (when/how long): Unable to quantify Substance #1 Name of Substance 1: Cocaine Substance #2 Name of Substance 2: Cannabis      ASAM's:  Six Dimensions of Multidimensional Assessment  Dimension 1:  Acute Intoxication and/or Withdrawal Potential:      Dimension 2:  Biomedical Conditions and Complications:      Dimension 3:  Emotional, Behavioral, or Cognitive Conditions and Complications:     Dimension 4:  Readiness to Change:     Dimension 5:  Relapse, Continued use, or Continued Problem Potential:     Dimension 6:  Recovery/Living Environment:     ASAM Severity Score:    ASAM Recommended Level of Treatment:     Substance use Disorder (SUD)    Recommendations for Services/Supports/Treatments:    Disposition Recommendation per psychiatric provider:    DSM5 Diagnoses: Patient Active Problem List   Diagnosis Date Noted   Cocaine abuse (HCC) 05/18/2022   Schizoaffective disorder (HCC) 06/27/2019   Schizophrenia, paranoid type (HCC) 03/12/2019   Cocaine abuse with intoxication (HCC)    Self-inflicted laceration of right wrist (HCC) 11/30/2018   Suicide ideation 11/29/2018   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

## 2023-02-12 NOTE — ED Notes (Signed)
Pt. Refused EKG, states "I just need some sleep"

## 2023-02-12 NOTE — ED Notes (Signed)
 Hospital meal provided, pt tolerated w/o complaints.  Waste discarded appropriately.

## 2023-02-12 NOTE — ED Triage Notes (Signed)
EMS brings pt in from local police station; st he can "see everything in life and so his heart is going to flicker"; pt is rambling incessantly to himself with an americ[place] flag wrapped around his head and a plastic bag around his neck

## 2023-02-13 DIAGNOSIS — F209 Schizophrenia, unspecified: Secondary | ICD-10-CM | POA: Diagnosis not present

## 2023-02-13 LAB — URINE DRUG SCREEN, QUALITATIVE (ARMC ONLY)
Amphetamines, Ur Screen: NOT DETECTED
Barbiturates, Ur Screen: NOT DETECTED
Benzodiazepine, Ur Scrn: POSITIVE — AB
Cannabinoid 50 Ng, Ur ~~LOC~~: POSITIVE — AB
Cocaine Metabolite,Ur ~~LOC~~: POSITIVE — AB
MDMA (Ecstasy)Ur Screen: NOT DETECTED
Methadone Scn, Ur: NOT DETECTED
Opiate, Ur Screen: NOT DETECTED
Phencyclidine (PCP) Ur S: NOT DETECTED
Tricyclic, Ur Screen: NOT DETECTED

## 2023-02-13 MED ORDER — OLANZAPINE 5 MG PO TABS: 5.0000 mg | ORAL_TABLET | Freq: Every day | ORAL | Status: DC

## 2023-02-13 MED ORDER — MIDAZOLAM HCL (PF) 10 MG/2ML IJ SOLN
5.0000 mg | Freq: Once | INTRAMUSCULAR | Status: AC
Start: 2023-02-13 — End: 2023-02-13
  Administered 2023-02-13: 5 mg via INTRAMUSCULAR
  Filled 2023-02-13: qty 2

## 2023-02-13 MED ORDER — DROPERIDOL 2.5 MG/ML IJ SOLN
5.0000 mg | Freq: Once | INTRAMUSCULAR | Status: AC
Start: 2023-02-13 — End: 2023-02-13
  Administered 2023-02-13: 5 mg via INTRAMUSCULAR
  Filled 2023-02-13 (×2): qty 2

## 2023-02-13 MED ORDER — ARIPIPRAZOLE 10 MG PO TABS
10.0000 mg | ORAL_TABLET | Freq: Every day | ORAL | Status: DC
  Administered 2023-02-14: 10 mg via ORAL
  Filled 2023-02-13: qty 1

## 2023-02-13 NOTE — ED Notes (Signed)
Patient pacing around saying" I have to get out of here" promise me you will get me out" Nurse let him know that it was not up to me, and He said " I signed myself in on my own I should be able to leave' Nurse let him know that she would talk to the Doctor, but the real decision was the Psych team. Patient states " yes let me talk to them, He started to get louder, but then said let me use the phone. Nurse did give him the phone.

## 2023-02-13 NOTE — ED Notes (Addendum)
Patient started yelling at someone on the phone then hung up and gave Tech the phone. Patient then proceeded to yell that He needed to leave, and started banging His head on the window to the nursing station over and over, Nurse notified Dr. Katrinka Blazing for new orders for medications to calm him down before He hurt himself.

## 2023-02-13 NOTE — ED Notes (Signed)
 Pt provided with dinner tray.

## 2023-02-13 NOTE — ED Notes (Addendum)
Pt taking shower. Pt was given hygiene items and the following, 1 clean top, 1 clean bottom, with 1 pair of disposable underwear.  Pt changed out into clean clothing.  Staff disposed of all shower supplies.   

## 2023-02-13 NOTE — ED Notes (Signed)
Patient was willing to allow Nurse to administered IM medication, He wanted to talk about His Mom and sister as we was drifting off to sleep, ask [place] not to leave him yet and soon afterward He said" Ok I'm ready to sleep, staff will continue to monitor for safety.

## 2023-02-13 NOTE — ED Notes (Signed)
PT IVC/recommend inpatient psychiatric hospitalization after medical hospitalization. Patient has been involuntarily committed on 02/12/23.

## 2023-02-13 NOTE — ED Notes (Signed)
Patient ate His lunch and had a beverage, talked to nurse about how He only wants His sister Pincus Sanes H.) to be His primary contact, and not to speak to anyone else in the family regarding His care. Patient states " I do not want to talk to my mom if she calls. Patient is pleasant and no behavioral issues.

## 2023-02-13 NOTE — ED Notes (Signed)
pt recieved snack and drink 

## 2023-02-13 NOTE — ED Notes (Addendum)
Patient's sister called Victorino Sparrow )and said that [place] and she are very close and if He ever starts to act out, just call her and if she talks to him He will calm down, and this morning He had tried to call her and she states that her phone was off, she said He would have been fine if she could have spoke to him, she also states that their Mom is mentally ill and tries to keep him in the hospital as often as she can so she can take His check> Sister states she has some where for him to live and His real only support person< she ask if He could call her when He wakes up. Nurse let her know that she would pass on this information.

## 2023-02-13 NOTE — ED Notes (Signed)
 Hospital meal provided, pt tolerated w/o complaints.  Waste discarded appropriately.

## 2023-02-13 NOTE — ED Provider Notes (Signed)
Emergency Medicine Observation Re-evaluation Note  REYCE LUBECK is a 33 y.o. male, seen on rounds today.  Pt initially presented to the ED for complaints of Psychiatric Evaluation Currently, the patient is resting. Earlier he did become erratic and was striking his head on a glass divider.  Required intramuscular calming agents that was taken willingly.  Physical Exam  BP 100/64 (BP Location: Left Arm)   Pulse 77   Temp 98.2 F (36.8 C) (Oral)   Resp 18   Ht 5\' 11"  (1.803 m)   SpO2 95%   BMI 23.68 kg/m   General: No distress   ED Course / MDM  EKG:   I have reviewed the labs performed to date as well as medications administered while in observation.  Recent changes in the last 24 hours include an episode of agitation.  Plan  Current plan is for placement.    Delton Prairie, MD 02/13/23 [phone]

## 2023-02-14 DIAGNOSIS — F209 Schizophrenia, unspecified: Secondary | ICD-10-CM | POA: Diagnosis not present

## 2023-02-14 MED ORDER — ACETAMINOPHEN 500 MG PO TABS
1000.0000 mg | ORAL_TABLET | Freq: Once | ORAL | Status: AC
  Administered 2023-02-14: 1000 mg via ORAL
  Filled 2023-02-14: qty 2

## 2023-02-14 MED ORDER — LORAZEPAM 2 MG PO TABS
2.0000 mg | ORAL_TABLET | Freq: Once | ORAL | Status: AC
  Administered 2023-02-14: 2 mg via ORAL
  Filled 2023-02-14: qty 1

## 2023-02-14 NOTE — ED Notes (Signed)
Patient is awake and states he ate his breakfast. Patient was also attempting to do a head stand. Told the patient he could do safe exercises. Patient stated ok and went to lay down.

## 2023-02-14 NOTE — BH Assessment (Signed)
TTS faxed referral for psych inpatient treatment, per request of AC. No appropriate beds available within system.

## 2023-02-14 NOTE — ED Notes (Signed)
 Breakfast tray provided.

## 2023-02-14 NOTE — ED Notes (Signed)
Patient was speaking with mom, handed the phone to me stating she wanted to speak with me. Patient did not inform the nurse that he was calling his mom. Mom stated she wanted to know why he was still here. Stated the patient told her we were not letting him leave and the patient has a "family, sister and nephews at home". I advised the patient was IVC'd and was accepted at [facility]. Advised the patient would be leaving today. She stated she did not want her some to go to [facility]. Stated when he was at [facility] he was on to much medication and they would not transport him back to the area. Also stated she was his legal guardian, but did not have any paperwork stating that. I recommended that she provide the documentation pertaining to his guardianship. She stated she would be calling her lawyer on myself and the hospital and hung up. CSW was standing next to this nurse during the call. She is aware.

## 2023-02-14 NOTE — ED Notes (Signed)
Patient has been at [facility]. [facility] called for transport.

## 2023-02-14 NOTE — ED Notes (Signed)
Assumed care of patient. During report, patient was at the door stating he would bang his head on the glass again because he could not sleep. Patient does not appear agitated. Advised when I was done with report I would request some medication.

## 2023-02-14 NOTE — ED Notes (Signed)
Patient is aware that he has a bed assignment at [facility].

## 2023-02-14 NOTE — ED Notes (Signed)
Pt requested a shower. This tech provided pt with hygiene items, towel, washcloths, underwear, a pair of socks, scrub pants and shirt. Pt is currently in the shower.

## 2023-02-14 NOTE — ED Notes (Signed)
Patient is awake threatening to bang his head on the glass because he cannot sleep. Medication was requested from the MD. Patient took medication and is now laying in bed.

## 2023-02-14 NOTE — ED Notes (Signed)
Vol /pending placement 

## 2023-02-14 NOTE — ED Provider Notes (Signed)
   [facility] Observation Note   ----------------------------------------- 10:54 AM on 02/14/2023 -----------------------------------------  [name] is a 33 y.o. male currently boarding in the Emergency Department.  No acute events since last update.  Recent Vitals   Most recent vital signs: Vitals:   02/13/23 1953 02/14/23 0755  BP: 109/70 121/83  Pulse: 75 87  Resp: 16 18  Temp: 98 F (36.7 C) 97.6 F (36.4 C)  SpO2: 96% 97%    ED Results / Procedures / Treatments   Labs (all labs ordered are listed, but only abnormal results are displayed) Labs Reviewed  COMPREHENSIVE METABOLIC PANEL - Abnormal; Notable for the following components:      Result Value   CO2 21 (*)    BUN 21 (*)    Total Protein 8.2 (*)    Total Bilirubin 1.3 (*)    Anion gap 17 (*)    All other components within normal limits  SALICYLATE LEVEL - Abnormal; Notable for the following components:   Salicylate Lvl <7.0 (*)    All other components within normal limits  ACETAMINOPHEN LEVEL - Abnormal; Notable for the following components:   Acetaminophen (Tylenol), Serum <10 (*)    All other components within normal limits  URINE DRUG SCREEN, QUALITATIVE ([facility] ONLY) - Abnormal; Notable for the following components:   Cocaine Metabolite,Ur [place] POSITIVE (*)    Cannabinoid 50 Ng, Ur [place] POSITIVE (*)    Benzodiazepine, Ur Scrn POSITIVE (*)    All other components within normal limits  ETHANOL  CBC    MEDICATIONS ORDERED IN ED: Medications  ARIPiprazole (ABILIFY) tablet 10 mg (10 mg Oral Not Given 02/13/23 1515)  droperidol (INAPSINE) 2.5 MG/ML injection 5 mg (5 mg Intramuscular Given 02/13/23 1037)  midazolam PF (VERSED) injection 5 mg (5 mg Intramuscular Given 02/13/23 1037)  LORazepam (ATIVAN) tablet 2 mg (2 mg Oral Given 02/14/23 0323)  acetaminophen (TYLENOL) tablet 1,000 mg (1,000 mg Oral Given 02/14/23 0818)     ED Plan   Currently awaiting placement into an  appropriate living facility.  Social work is working with the patient to help achieve this.      Minna Antis, MD 02/14/23 1054

## 2023-02-14 NOTE — BH Assessment (Signed)
Patient has been accepted to [facility].  Patient assigned to building [address] [place] Accepting physician is Dr. Loni Beckwith.  Call report to [phone].  Representative was [facility].   ER Staff is aware of it: Cordelia Pen, ER Secretary  Dr. Larinda Buttery, ER MD  Kathryne Hitch, Patient's Nurse

## 2023-02-14 NOTE — ED Notes (Signed)
Patient is speaking with his mom on the phone.

## 2023-02-14 NOTE — ED Notes (Signed)
Pt was provided with lunch tray.

## 2023-03-02 ENCOUNTER — Emergency Department
Admission: EM | Admit: 2023-03-02 | Discharge: 2023-03-02 | Disposition: A | Discharge status: Home / Self Care | Payer: Medicare Other | Attending: Emergency Medicine | Admitting: Emergency Medicine

## 2023-03-02 ENCOUNTER — Other Ambulatory Visit: Payer: Self-pay

## 2023-03-02 ENCOUNTER — Emergency Department: Payer: Medicare Other

## 2023-03-02 DIAGNOSIS — W010XXA Fall on same level from slipping, tripping and stumbling without subsequent striking against object, initial encounter: Secondary | ICD-10-CM | POA: Insufficient documentation

## 2023-03-02 DIAGNOSIS — M25521 Pain in right elbow: Secondary | ICD-10-CM | POA: Diagnosis present

## 2023-03-02 DIAGNOSIS — R0789 Other chest pain: Secondary | ICD-10-CM | POA: Insufficient documentation

## 2023-03-02 DIAGNOSIS — W19XXXA Unspecified fall, initial encounter: Secondary | ICD-10-CM | POA: Insufficient documentation

## 2023-03-02 LAB — BASIC METABOLIC PANEL
Anion gap: 10 (ref 5–15)
BUN: 19 mg/dL (ref 6–20)
CO2: 26 mmol/L (ref 22–32)
Calcium: 9.2 mg/dL (ref 8.9–10.3)
Chloride: 103 mmol/L (ref 98–111)
Creatinine, Ser: 0.87 mg/dL (ref 0.61–1.24)
GFR, Estimated: >60 mL/min (ref >60)
Glucose, Bld: 119 mg/dL — ABNORMAL HIGH (ref 70–99)
Potassium: 4.1 mmol/L (ref 3.5–5.1)
Sodium: 139 mmol/L (ref 135–145)

## 2023-03-02 LAB — CBC
HCT: 44.8 % (ref 39.0–52.0)
Hemoglobin: 14.7 g/dL (ref 13.0–17.0)
MCH: 28.3 pg (ref 26.0–34.0)
MCHC: 32.8 g/dL (ref 30.0–36.0)
MCV: 86.2 fL (ref 80.0–100.0)
Platelets: 171 10*3/uL (ref 150–400)
RBC: 5.2 MIL/uL (ref 4.22–5.81)
RDW: 14.6 % (ref 11.5–15.5)
WBC: 6.5 10*3/uL (ref 4.0–10.5)
nRBC: 0 % (ref 0.0–0.2)

## 2023-03-02 LAB — TROPONIN I (HIGH SENSITIVITY): Troponin I (High Sensitivity): 4 ng/L (ref <18)

## 2023-03-02 NOTE — ED Provider Notes (Signed)
[facility] Provider Note    Event Date/Time   First MD Initiated Contact with Patient 03/02/23 [phone]     (approximate)   History   Chest Pain   HPI  [name] is a 33 y.o. male who presents to the ED for evaluation of Chest Pain   I reviewed ED visit from 3 hours ago, as well as frequent ED visits often related to psychiatric illness.  History of schizophrenia.  Seen earlier this morning for right arm injury after he slipped on some wet grass and fell.  Reassuring x-rays to the right elbow.  Discharged.  Patient presents for evaluation of 3 years of intermittent chest pain.  Reports concern that during his evaluation earlier today he was not evaluated for chest pain so he checks back in.  No acute changes to the chronic intermittent discomfort.  No syncope.   Physical Exam   Triage Vital Signs: ED Triage Vitals  Encounter Vitals Group     BP 03/02/23 0828 119/83     Systolic BP Percentile --      Diastolic BP Percentile --      Pulse Rate 03/02/23 0828 71     Resp 03/02/23 0828 14     Temp 03/02/23 0828 97.8 F (36.6 C)     Temp Source 03/02/23 0828 Oral     SpO2 03/02/23 0828 100 %     Weight 03/02/23 0822 169 lb 12.1 oz (77 kg)     Height 03/02/23 0822 5\' 11"  (1.803 m)     Head Circumference --      Peak Flow --      Pain Score 03/02/23 0822 5     Pain Loc --      Pain Education --      Exclude from Growth Chart --     Most recent vital signs: Vitals:   03/02/23 0828  BP: 119/83  Pulse: 71  Resp: 14  Temp: 97.8 F (36.6 C)  SpO2: 100%    General: Awake, no distress.  CV:  Good peripheral perfusion.  Resp:  Normal effort.  Abd:  No distention.  MSK:  No deformity noted.  Neuro:  No focal deficits appreciated. Other:     ED Results / Procedures / Treatments   Labs (all labs ordered are listed, but only abnormal results are displayed) Labs Reviewed  BASIC METABOLIC PANEL - Abnormal; Notable for the following  components:      Result Value   Glucose, Bld 119 (*)    All other components within normal limits  CBC  TROPONIN I (HIGH SENSITIVITY)    EKG Sinus rhythm with a rate of 72 bpm.  Normal axis and intervals.  Evidence of LVH.  T wave inversions to lead III.  No other clear signs of acute ischemia.  Comparison EKG from a few weeks ago did not have these T wave inversions.  Otherwise similar morphology.  RADIOLOGY CXR interpreted by me without evidence of acute cardiopulmonary pathology.  Official radiology report(s): DG Chest 2 View Result Date: 03/02/2023 CLINICAL DATA:  Chest pain EXAM: CHEST - 2 VIEW COMPARISON:  04/01/2019 FINDINGS: The heart size and mediastinal contours are within normal limits. Both lungs are clear. The visualized skeletal structures are unremarkable. IMPRESSION: No active cardiopulmonary disease. Electronically Signed   By: Judie Petit.  Shick M.D.   On: 03/02/2023 08:55   DG Elbow Complete Right Result Date: 03/02/2023 CLINICAL DATA:  Right elbow pain following fall, initial encounter  EXAM: RIGHT ELBOW - COMPLETE 3+ VIEW COMPARISON:  None Available. FINDINGS: There is no evidence of fracture, dislocation, or joint effusion. There is no evidence of arthropathy or other focal bone abnormality. Soft tissues are unremarkable. IMPRESSION: No acute abnormality noted. Electronically Signed   By: Alcide Clever M.D.   On: 03/02/2023 02:47    PROCEDURES and INTERVENTIONS:  .1-3 Lead EKG Interpretation  Performed by: Delton Prairie, MD Authorized by: Delton Prairie, MD     Interpretation: normal     ECG rate:  72   ECG rate assessment: normal     Rhythm: sinus rhythm     Ectopy: none     Conduction: normal     Medications - No data to display   IMPRESSION / MDM / ASSESSMENT AND PLAN / ED COURSE  I reviewed the triage vital signs and the nursing notes.  Differential diagnosis includes, but is not limited to, ACS, PTX, PNA, muscle strain/spasm, PE, dissection, anxiety, pleural  effusion  {Patient presents with symptoms of an acute illness or injury that is potentially life-threatening.  Fairly low risk patient from a cardiac perspective presents with chronic intermittent discomfort.  EKG with isolated T wave changes to lead III but no other ischemic features and no reciprocal changes.  Troponin is negative, CBC and metabolic panel are normal.  CXR is clear.  He request discharge prior to his second troponin, acknowledging risk of undiagnosed pathology.  We discussed close ED return precautions.  Suitable for outpatient management.  Clinical Course as of 03/02/23 0914  Tue Mar 02, 2023  0911 Patient stepping out of his room and requesting discharge.  Reports his ride has to leave and he is unwilling to stay.  The acknowledges the risk of undiagnosed pathology.  We discussed ED return precautions.  I educated him of reassuring workup so far and he is Adult nurse. [DS]    Clinical Course User Index [DS] Delton Prairie, MD     FINAL CLINICAL IMPRESSION(S) / ED DIAGNOSES   Final diagnoses:  Other chest pain     Rx / DC Orders   ED Discharge Orders     None        Note:  This document was prepared using Dragon voice recognition software and may include unintentional dictation errors.   Delton Prairie, MD 03/02/23 [phone]

## 2023-03-02 NOTE — ED Triage Notes (Signed)
Pt here with cp that started 10 mins ago. Pt states he was here earlier for a fall. Pt states pain is centered and radiates to his left hand. Pt states pain is tight and has been intermittent. PT ambulatory to triage.

## 2023-03-02 NOTE — ED Triage Notes (Signed)
Pt reports he fell aprox 1 hour ago and injured his right arm, pt moving extremity.Marland Kitchen

## 2023-03-02 NOTE — ED Provider Notes (Signed)
[facility] Provider Note    Event Date/Time   First MD Initiated Contact with Patient 03/02/23 [phone]     (approximate)   History   Chief Complaint Arm Injury   HPI  [name] is a 33 y.o. male with past medical history of schizophrenia and polysubstance use who presents to the ED complaining of arm injury.  Patient reports that earlier this evening he slipped on some wet grass and fell onto his right arm.  He denies hitting his head or losing consciousness, complains only of pain at his right elbow.  He states it is painful for him to bend the arm at the elbow, denies any pain at the shoulder or wrist.     Physical Exam   Triage Vital Signs: ED Triage Vitals  Encounter Vitals Group     BP 03/02/23 0154 118/83     Systolic BP Percentile --      Diastolic BP Percentile --      Pulse Rate 03/02/23 0154 83     Resp 03/02/23 0154 18     Temp 03/02/23 0154 98 F (36.7 C)     Temp src --      SpO2 03/02/23 0154 100 %     Weight 03/02/23 0153 169 lb 12.1 oz (77 kg)     Height 03/02/23 0153 5\' 11"  (1.803 m)     Head Circumference --      Peak Flow --      Pain Score --      Pain Loc --      Pain Education --      Exclude from Growth Chart --     Most recent vital signs: Vitals:   03/02/23 0154  BP: 118/83  Pulse: 83  Resp: 18  Temp: 98 F (36.7 C)  SpO2: 100%    Constitutional: Alert and oriented. Eyes: Conjunctivae are normal. Head: Atraumatic. Nose: No congestion/rhinnorhea. Mouth/Throat: Mucous membranes are moist.  Cardiovascular: Normal rate, regular rhythm. Grossly normal heart sounds.  2+ radial pulses bilaterally. Respiratory: Normal respiratory effort.  No retractions. Lungs CTAB. Gastrointestinal: Soft and nontender. No distention. Musculoskeletal: No lower extremity tenderness nor edema.  Diffuse tenderness to palpation of right elbow with no deformity, no tenderness to palpation at right wrist or shoulder. Neurologic:   Normal speech and language. No gross focal neurologic deficits are appreciated.    ED Results / Procedures / Treatments   Labs (all labs ordered are listed, but only abnormal results are displayed) Labs Reviewed - No data to display   RADIOLOGY Right elbow x-ray reviewed and interpreted by me with no fracture or dislocation.  PROCEDURES:  Critical Care performed: No  Procedures   MEDICATIONS ORDERED IN ED: Medications - No data to display   IMPRESSION / MDM / ASSESSMENT AND PLAN / ED COURSE  I reviewed the triage vital signs and the nursing notes.                              33 y.o. male with past medical history of schizophrenia and polysubstance abuse who presents to the ED complaining of right elbow pain following a fall.  Patient's presentation is most consistent with acute complicated illness / injury requiring diagnostic workup.  Differential diagnosis includes, but is not limited to, fracture, dislocation, contusion.  Patient nontoxic-appearing and in no acute distress, vital signs are unremarkable.  He has diffuse tenderness to palpation  of the right elbow with no obvious deformity.  X-ray imaging is unremarkable, suspect contusion.  Patient appropriate for discharge home with outpatient follow-up as needed, was counseled to return to the ED for new or worsening symptoms.  Patient agrees with plan.      FINAL CLINICAL IMPRESSION(S) / ED DIAGNOSES   Final diagnoses:  Fall, initial encounter  Right elbow pain     Rx / DC Orders   ED Discharge Orders     None        Note:  This document was prepared using Dragon voice recognition software and may include unintentional dictation errors.   Chesley Noon, MD 03/02/23 [phone]

## 2023-04-04 ENCOUNTER — Other Ambulatory Visit (HOSPITAL_COMMUNITY): Admission: EM | Admit: 2023-04-04 | Discharge: 2023-04-04 | Discharge status: Against Medical Advice

## 2023-04-04 ENCOUNTER — Emergency Department

## 2023-04-04 ENCOUNTER — Emergency Department
Admission: EM | Admit: 2023-04-04 | Discharge: 2023-04-04 | Disposition: A | Discharge status: Home / Self Care | Attending: Emergency Medicine | Admitting: Emergency Medicine

## 2023-04-04 ENCOUNTER — Other Ambulatory Visit: Payer: Self-pay

## 2023-04-04 DIAGNOSIS — R45851 Suicidal ideations: Secondary | ICD-10-CM | POA: Diagnosis not present

## 2023-04-04 DIAGNOSIS — S022XXA Fracture of nasal bones, initial encounter for closed fracture: Secondary | ICD-10-CM | POA: Diagnosis not present

## 2023-04-04 DIAGNOSIS — F32A Depression, unspecified: Secondary | ICD-10-CM

## 2023-04-04 DIAGNOSIS — R45850 Homicidal ideations: Secondary | ICD-10-CM | POA: Insufficient documentation

## 2023-04-04 DIAGNOSIS — S0003XA Contusion of scalp, initial encounter: Secondary | ICD-10-CM | POA: Diagnosis not present

## 2023-04-04 DIAGNOSIS — S1091XA Abrasion of unspecified part of neck, initial encounter: Secondary | ICD-10-CM | POA: Insufficient documentation

## 2023-04-04 DIAGNOSIS — R7309 Other abnormal glucose: Secondary | ICD-10-CM | POA: Diagnosis not present

## 2023-04-04 DIAGNOSIS — F329 Major depressive disorder, single episode, unspecified: Secondary | ICD-10-CM | POA: Diagnosis present

## 2023-04-04 DIAGNOSIS — F1410 Cocaine abuse, uncomplicated: Secondary | ICD-10-CM | POA: Diagnosis present

## 2023-04-04 DIAGNOSIS — S0990XA Unspecified injury of head, initial encounter: Secondary | ICD-10-CM

## 2023-04-04 DIAGNOSIS — R569 Unspecified convulsions: Secondary | ICD-10-CM | POA: Diagnosis present

## 2023-04-04 LAB — CBG MONITORING, ED
Glucose-Capillary: 96 mg/dL (ref 70–99)
Glucose-Capillary: 100 mg/dL — ABNORMAL HIGH (ref 70–99)

## 2023-04-04 LAB — COMPREHENSIVE METABOLIC PANEL
ALT: 13 U/L (ref 0–44)
AST: 18 U/L (ref 15–41)
Albumin: 3.9 g/dL (ref 3.5–5.0)
Alkaline Phosphatase: 47 U/L (ref 38–126)
Anion gap: 8 (ref 5–15)
BUN: 7 mg/dL (ref 6–20)
CO2: 23 mmol/L (ref 22–32)
Calcium: 8.9 mg/dL (ref 8.9–10.3)
Chloride: 106 mmol/L (ref 98–111)
Creatinine, Ser: 0.85 mg/dL (ref 0.61–1.24)
GFR, Estimated: >60 mL/min (ref >60)
Glucose, Bld: 109 mg/dL — ABNORMAL HIGH (ref 70–99)
Potassium: 3.1 mmol/L — ABNORMAL LOW (ref 3.5–5.1)
Sodium: 137 mmol/L (ref 135–145)
Total Bilirubin: 0.7 mg/dL (ref 0.0–1.2)
Total Protein: 7.1 g/dL (ref 6.5–8.1)

## 2023-04-04 LAB — URINE DRUG SCREEN, QUALITATIVE (ARMC ONLY)
Amphetamines, Ur Screen: NOT DETECTED
Barbiturates, Ur Screen: NOT DETECTED
Benzodiazepine, Ur Scrn: NOT DETECTED
Cannabinoid 50 Ng, Ur ~~LOC~~: POSITIVE — AB
Cocaine Metabolite,Ur ~~LOC~~: POSITIVE — AB
MDMA (Ecstasy)Ur Screen: NOT DETECTED
Methadone Scn, Ur: NOT DETECTED
Opiate, Ur Screen: NOT DETECTED
Phencyclidine (PCP) Ur S: NOT DETECTED
Tricyclic, Ur Screen: NOT DETECTED

## 2023-04-04 LAB — CBC
HCT: 39.4 % (ref 39.0–52.0)
Hemoglobin: 13.5 g/dL (ref 13.0–17.0)
MCH: 29.3 pg (ref 26.0–34.0)
MCHC: 34.3 g/dL (ref 30.0–36.0)
MCV: 85.7 fL (ref 80.0–100.0)
Platelets: 239 10*3/uL (ref 150–400)
RBC: 4.6 MIL/uL (ref 4.22–5.81)
RDW: 13.9 % (ref 11.5–15.5)
WBC: 5.7 10*3/uL (ref 4.0–10.5)
nRBC: 0 % (ref 0.0–0.2)

## 2023-04-04 LAB — ETHANOL: Alcohol, Ethyl (B): <10 mg/dL (ref <10)

## 2023-04-04 MED ORDER — LIDOCAINE 5 % EX PTCH
1.0000 | MEDICATED_PATCH | CUTANEOUS | Status: DC
  Administered 2023-04-04: 1 via TRANSDERMAL
  Filled 2023-04-04: qty 1

## 2023-04-04 MED ORDER — ACETAMINOPHEN 500 MG PO TABS
1000.0000 mg | ORAL_TABLET | Freq: Once | ORAL | Status: AC
  Administered 2023-04-04: 1000 mg via ORAL
  Filled 2023-04-04: qty 2

## 2023-04-04 MED ORDER — IOHEXOL 350 MG/ML SOLN
75.0000 mL | Freq: Once | INTRAVENOUS | Status: AC | PRN
  Administered 2023-04-04: 75 mL via INTRAVENOUS
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
## 2023-04-04 NOTE — ED Notes (Signed)
 Pt given clothes to change into for the trip to [facility]. Pt sitting in bed on phone talking with his mother.

## 2023-04-04 NOTE — ED Notes (Signed)
 Pt had IV in arm that was not documented in chart. IV taken out at this time.

## 2023-04-04 NOTE — ED Notes (Signed)
 Pt sleeping breakfast tray placed at bedside

## 2023-04-04 NOTE — ED Notes (Addendum)
 Pt ambulatory to the bathroom. Pt denies any SI/HI.

## 2023-04-04 NOTE — ED Provider Notes (Addendum)
-----------------------------------------   4:55 PM on 04/04/2023 -----------------------------------------   Blood pressure 117/76, pulse 70, temperature 98 F (36.7 C), temperature source Oral, resp. rate 16, height 5\' 10"  (1.778 m), weight 81.6 kg, SpO2 98%.  Patient to be transferred to [facility] for ongoing psychiatric care.   Janith Lima, MD 04/04/23 1655  Addendum: While waiting for transport to psychiatric facility, patient left AMA.  He was here under voluntary status.  Discussed this with the psychiatry team given that there was a note about potentially needing to IVC.  Psychiatry felt he did not need to be under IVC and was safe to go at this time.   Janith Lima, MD 04/04/23 [phone]
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
## 2023-04-04 NOTE — ED Notes (Signed)
 Pt left AMA while this RN was in another rm providing pt care.

## 2023-04-04 NOTE — ED Notes (Signed)
 Pt instructed on obtaining a urine sample. Pt stating he does not need to go right now.

## 2023-04-04 NOTE — ED Notes (Addendum)
 This RN and MD attempted to reach out to [facility] and [facility] psych with no response if pt needed to be brought back IVC'd after leaving AMA at this time.

## 2023-04-04 NOTE — ED Notes (Signed)
 This Rn has attempted multiple times to get pt to give urine sample for UDS in order to transport.

## 2023-04-04 NOTE — Consult Note (Addendum)
 This provider attempted to evaluate patient via tele-assessment.  Observed, sleeping appears to be difficult to arouse.  patient did not respond to questions or his name.  Will attempt to reassess. RN to notify psychiatry/triage counselor when patient is able to participate with assessment.
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
## 2023-04-04 NOTE — ED Notes (Signed)
 EKG read by Dr. Fuller Plan. Pt denies any SOB or CP. Pt c/o pain all over body. MD made aware.

## 2023-04-04 NOTE — ED Notes (Signed)
 Pt was dressed out by Clinical research associate and Engineer, maintenance (IT) into [employer] All belongings were placed into patient belongings bag. Belongings are:  Hydrologist Black sweat pants Black socks Black shoes Bracelet  Green underwear  Phone
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
## 2023-04-04 NOTE — ED Notes (Signed)
Pt given meal tray and beverage.

## 2023-05-06 ENCOUNTER — Emergency Department
Admission: EM | Admit: 2023-05-06 | Discharge: 2023-05-06 | Discharge status: Against Medical Advice | Attending: Emergency Medicine | Admitting: Emergency Medicine

## 2023-05-06 ENCOUNTER — Other Ambulatory Visit: Payer: Self-pay

## 2023-05-06 ENCOUNTER — Emergency Department

## 2023-05-06 DIAGNOSIS — Z5329 Procedure and treatment not carried out because of patient's decision for other reasons: Secondary | ICD-10-CM | POA: Diagnosis not present

## 2023-05-06 DIAGNOSIS — S0181XA Laceration without foreign body of other part of head, initial encounter: Secondary | ICD-10-CM | POA: Insufficient documentation

## 2023-05-06 DIAGNOSIS — S0993XA Unspecified injury of face, initial encounter: Secondary | ICD-10-CM | POA: Diagnosis present

## 2023-05-06 DIAGNOSIS — E876 Hypokalemia: Secondary | ICD-10-CM | POA: Diagnosis not present

## 2023-05-06 DIAGNOSIS — X58XXXA Exposure to other specified factors, initial encounter: Secondary | ICD-10-CM | POA: Diagnosis not present

## 2023-05-06 LAB — CBC WITH DIFFERENTIAL/PLATELET
Abs Immature Granulocytes: 0.01 10*3/uL (ref 0.00–0.07)
Basophils Absolute: 0 10*3/uL (ref 0.0–0.1)
Basophils Relative: 0 %
Eosinophils Absolute: 0 10*3/uL (ref 0.0–0.5)
Eosinophils Relative: 0 %
HCT: 40.5 % (ref 39.0–52.0)
Hemoglobin: 14 g/dL (ref 13.0–17.0)
Immature Granulocytes: 0 %
Lymphocytes Relative: 28 %
Lymphs Abs: 1.7 10*3/uL (ref 0.7–4.0)
MCH: 29.7 pg (ref 26.0–34.0)
MCHC: 34.6 g/dL (ref 30.0–36.0)
MCV: 86 fL (ref 80.0–100.0)
Monocytes Absolute: 0.5 10*3/uL (ref 0.1–1.0)
Monocytes Relative: 8 %
Neutro Abs: 4 10*3/uL (ref 1.7–7.7)
Neutrophils Relative %: 64 %
Platelets: 206 10*3/uL (ref 150–400)
RBC: 4.71 MIL/uL (ref 4.22–5.81)
RDW: 13 % (ref 11.5–15.5)
WBC: 6.2 10*3/uL (ref 4.0–10.5)
nRBC: 0 % (ref 0.0–0.2)

## 2023-05-06 LAB — COMPREHENSIVE METABOLIC PANEL WITH GFR
ALT: 14 U/L (ref 0–44)
AST: 15 U/L (ref 15–41)
Albumin: 4.2 g/dL (ref 3.5–5.0)
Alkaline Phosphatase: 46 U/L (ref 38–126)
Anion gap: 8 (ref 5–15)
BUN: 14 mg/dL (ref 6–20)
CO2: 23 mmol/L (ref 22–32)
Calcium: 9 mg/dL (ref 8.9–10.3)
Chloride: 109 mmol/L (ref 98–111)
Creatinine, Ser: 0.76 mg/dL (ref 0.61–1.24)
GFR, Estimated: >60 mL/min (ref >60)
Glucose, Bld: 80 mg/dL (ref 70–99)
Potassium: 3.2 mmol/L — ABNORMAL LOW (ref 3.5–5.1)
Sodium: 140 mmol/L (ref 135–145)
Total Bilirubin: 0.7 mg/dL (ref 0.0–1.2)
Total Protein: 7.1 g/dL (ref 6.5–8.1)

## 2023-05-06 MED ORDER — POTASSIUM CHLORIDE CRYS ER 20 MEQ PO TBCR
40.0000 meq | EXTENDED_RELEASE_TABLET | Freq: Once | ORAL | Status: AC
  Administered 2023-05-06: 40 meq via ORAL
  Filled 2023-05-06: qty 2

## 2023-05-06 MED ORDER — HYDROCODONE-ACETAMINOPHEN 5-325 MG PO TABS
1.0000 | ORAL_TABLET | Freq: Once | ORAL | Status: AC
  Administered 2023-05-06: 1 via ORAL
  Filled 2023-05-06: qty 1

## 2023-05-06 MED ORDER — LIDOCAINE HCL (PF) 1 % IJ SOLN
5.0000 mL | Freq: Once | INTRAMUSCULAR | Status: AC
  Administered 2023-05-06: 5 mL via INTRADERMAL
  Filled 2023-05-06: qty 5

## 2023-05-06 NOTE — ED Notes (Signed)
 Pt is requesting something for pain. Lauren, PA informed via epic secure chat.

## 2023-05-06 NOTE — ED Notes (Signed)
 Pt walked out of his room yelling and cursing, demanding someone to come see him and demanding someone remove his IV. He stated he was no longer waiting. This RN was assisting another patient at that time and so Tom, RN assisted him and removed his IV. Pt seem ambulating out of ED with independent steady gait, A&OX4.
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
## 2023-05-06 NOTE — ED Notes (Signed)
 Pt came out of the room and was standing in the hallway yelling for someone to come and "take the IV out NOW!" This nurse responded to his outburst and asked him to sit while I informed the provider and got supplies to clean his face. Pt allowed removal of the IV and cleaning of his lip with surgical soap and saline. Pt departed AMA immediately upon completion of the cleaning of his wound. Provider notified of his departure. No departing vitals taken.
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
## 2023-05-08 ENCOUNTER — Emergency Department
Admission: EM | Admit: 2023-05-08 | Discharge: 2023-05-08 | Disposition: A | Discharge status: Home / Self Care | Attending: Emergency Medicine | Admitting: Emergency Medicine

## 2023-05-08 ENCOUNTER — Encounter: Payer: Self-pay | Admitting: Emergency Medicine

## 2023-05-08 ENCOUNTER — Other Ambulatory Visit: Payer: Self-pay

## 2023-05-08 ENCOUNTER — Emergency Department

## 2023-05-08 DIAGNOSIS — F209 Schizophrenia, unspecified: Secondary | ICD-10-CM | POA: Insufficient documentation

## 2023-05-08 DIAGNOSIS — S0990XA Unspecified injury of head, initial encounter: Secondary | ICD-10-CM | POA: Diagnosis present

## 2023-05-08 DIAGNOSIS — S01111A Laceration without foreign body of right eyelid and periocular area, initial encounter: Secondary | ICD-10-CM

## 2023-05-08 DIAGNOSIS — R45851 Suicidal ideations: Secondary | ICD-10-CM

## 2023-05-08 LAB — COMPREHENSIVE METABOLIC PANEL WITH GFR
ALT: 14 U/L (ref 0–44)
AST: 18 U/L (ref 15–41)
Albumin: 4.2 g/dL (ref 3.5–5.0)
Alkaline Phosphatase: 53 U/L (ref 38–126)
Anion gap: 9 (ref 5–15)
BUN: 13 mg/dL (ref 6–20)
CO2: 25 mmol/L (ref 22–32)
Calcium: 9.3 mg/dL (ref 8.9–10.3)
Chloride: 107 mmol/L (ref 98–111)
Creatinine, Ser: 0.87 mg/dL (ref 0.61–1.24)
GFR, Estimated: >60 mL/min (ref >60)
Glucose, Bld: 82 mg/dL (ref 70–99)
Potassium: 3.4 mmol/L — ABNORMAL LOW (ref 3.5–5.1)
Sodium: 141 mmol/L (ref 135–145)
Total Bilirubin: 0.6 mg/dL (ref 0.0–1.2)
Total Protein: 7.4 g/dL (ref 6.5–8.1)

## 2023-05-08 LAB — CBC WITH DIFFERENTIAL/PLATELET
Abs Immature Granulocytes: 0.03 10*3/uL (ref 0.00–0.07)
Basophils Absolute: 0 10*3/uL (ref 0.0–0.1)
Basophils Relative: 0 %
Eosinophils Absolute: 0 10*3/uL (ref 0.0–0.5)
Eosinophils Relative: 0 %
HCT: 45.9 % (ref 39.0–52.0)
Hemoglobin: 15.4 g/dL (ref 13.0–17.0)
Immature Granulocytes: 0 %
Lymphocytes Relative: 22 %
Lymphs Abs: 1.9 10*3/uL (ref 0.7–4.0)
MCH: 29.5 pg (ref 26.0–34.0)
MCHC: 33.6 g/dL (ref 30.0–36.0)
MCV: 87.9 fL (ref 80.0–100.0)
Monocytes Absolute: 0.7 10*3/uL (ref 0.1–1.0)
Monocytes Relative: 8 %
Neutro Abs: 6.1 10*3/uL (ref 1.7–7.7)
Neutrophils Relative %: 70 %
Platelets: 203 10*3/uL (ref 150–400)
RBC: 5.22 MIL/uL (ref 4.22–5.81)
RDW: 12.9 % (ref 11.5–15.5)
WBC: 8.7 10*3/uL (ref 4.0–10.5)
nRBC: 0 % (ref 0.0–0.2)

## 2023-05-08 LAB — ETHANOL: Alcohol, Ethyl (B): <15 mg/dL (ref <15)

## 2023-05-08 LAB — ACETAMINOPHEN LEVEL: Acetaminophen (Tylenol), Serum: <10 ug/mL — ABNORMAL LOW (ref 10–30)

## 2023-05-08 LAB — SALICYLATE LEVEL: Salicylate Lvl: <7 mg/dL — ABNORMAL LOW (ref 7.0–30.0)

## 2023-05-08 MED ORDER — LIDOCAINE HCL (PF) 1 % IJ SOLN
10.0000 mL | Freq: Once | INTRAMUSCULAR | Status: AC
  Administered 2023-05-08: 10 mL
  Filled 2023-05-08: qty 10

## 2023-05-08 NOTE — ED Notes (Signed)
 EDP personally spoke with patient about discharge information.

## 2023-05-08 NOTE — Discharge Instructions (Addendum)
Gently wash the wound with soap and water.  It is okay to shower, but do not submerge in a bath or go swimming as it is healing.  Do not vigorously scrub.   Gently pat dry.   Once dry, then apply Neosporin or bacitracin or even Vaseline ointment to the area to act as a barrier to help prevent infection.

## 2023-05-08 NOTE — ED Notes (Signed)
 Patient transported to CT

## 2023-05-08 NOTE — ED Provider Notes (Signed)
 Patient received in signout from Dr. Margery Sheets pending reevaluation, psychiatric and TOC evaluation.  Patient has been cleared from a psychiatric perspective.  He reports he is comfortable going home and is asking to leave.  He is calling for a ride and reports he has a safe place to go.  We discussed close ED return precautions.   Arline Bennett, MD 05/08/23 [phone]
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
## 2023-05-10 ENCOUNTER — Other Ambulatory Visit: Payer: Self-pay

## 2023-05-10 ENCOUNTER — Encounter: Payer: Self-pay | Admitting: Emergency Medicine

## 2023-05-10 ENCOUNTER — Emergency Department
Admission: EM | Admit: 2023-05-10 | Discharge: 2023-05-11 | Disposition: A | Discharge status: Home / Self Care | Attending: Emergency Medicine | Admitting: Emergency Medicine

## 2023-05-10 ENCOUNTER — Emergency Department

## 2023-05-10 DIAGNOSIS — R456 Violent behavior: Secondary | ICD-10-CM | POA: Insufficient documentation

## 2023-05-10 DIAGNOSIS — F1490 Cocaine use, unspecified, uncomplicated: Secondary | ICD-10-CM | POA: Diagnosis not present

## 2023-05-10 DIAGNOSIS — K0889 Other specified disorders of teeth and supporting structures: Secondary | ICD-10-CM

## 2023-05-10 DIAGNOSIS — R45851 Suicidal ideations: Secondary | ICD-10-CM | POA: Diagnosis not present

## 2023-05-10 DIAGNOSIS — Z9151 Personal history of suicidal behavior: Secondary | ICD-10-CM | POA: Diagnosis not present

## 2023-05-10 DIAGNOSIS — R4689 Other symptoms and signs involving appearance and behavior: Secondary | ICD-10-CM

## 2023-05-10 DIAGNOSIS — Z62810 Personal history of physical and sexual abuse in childhood: Secondary | ICD-10-CM | POA: Diagnosis not present

## 2023-05-10 DIAGNOSIS — W2201XA Walked into wall, initial encounter: Secondary | ICD-10-CM | POA: Insufficient documentation

## 2023-05-10 DIAGNOSIS — S0990XA Unspecified injury of head, initial encounter: Secondary | ICD-10-CM | POA: Diagnosis not present

## 2023-05-10 DIAGNOSIS — F259 Schizoaffective disorder, unspecified: Secondary | ICD-10-CM | POA: Diagnosis not present

## 2023-05-10 DIAGNOSIS — F209 Schizophrenia, unspecified: Secondary | ICD-10-CM | POA: Insufficient documentation

## 2023-05-10 DIAGNOSIS — F1410 Cocaine abuse, uncomplicated: Secondary | ICD-10-CM | POA: Insufficient documentation

## 2023-05-10 DIAGNOSIS — F1520 Other stimulant dependence, uncomplicated: Secondary | ICD-10-CM

## 2023-05-10 LAB — COMPREHENSIVE METABOLIC PANEL WITH GFR
ALT: 16 U/L (ref 0–44)
AST: 19 U/L (ref 15–41)
Albumin: 4.3 g/dL (ref 3.5–5.0)
Alkaline Phosphatase: 55 U/L (ref 38–126)
Anion gap: 11 (ref 5–15)
BUN: 15 mg/dL (ref 6–20)
CO2: 26 mmol/L (ref 22–32)
Calcium: 9.2 mg/dL (ref 8.9–10.3)
Chloride: 101 mmol/L (ref 98–111)
Creatinine, Ser: 1.02 mg/dL (ref 0.61–1.24)
GFR, Estimated: >60 mL/min (ref >60)
Glucose, Bld: 78 mg/dL (ref 70–99)
Potassium: 3.4 mmol/L — ABNORMAL LOW (ref 3.5–5.1)
Sodium: 138 mmol/L (ref 135–145)
Total Bilirubin: 0.4 mg/dL (ref 0.0–1.2)
Total Protein: 7.6 g/dL (ref 6.5–8.1)

## 2023-05-10 LAB — CBC
HCT: 45.6 % (ref 39.0–52.0)
Hemoglobin: 15.5 g/dL (ref 13.0–17.0)
MCH: 29.5 pg (ref 26.0–34.0)
MCHC: 34 g/dL (ref 30.0–36.0)
MCV: 86.7 fL (ref 80.0–100.0)
Platelets: 187 K/uL (ref 150–400)
RBC: 5.26 MIL/uL (ref 4.22–5.81)
RDW: 12.9 % (ref 11.5–15.5)
WBC: 5.3 K/uL (ref 4.0–10.5)
nRBC: 0 % (ref 0.0–0.2)

## 2023-05-10 LAB — ACETAMINOPHEN LEVEL: Acetaminophen (Tylenol), Serum: <10 ug/mL — ABNORMAL LOW (ref 10–30)

## 2023-05-10 LAB — ETHANOL: Alcohol, Ethyl (B): <15 mg/dL (ref <15)

## 2023-05-10 LAB — SALICYLATE LEVEL: Salicylate Lvl: <7 mg/dL — ABNORMAL LOW (ref 7.0–30.0)

## 2023-05-10 MED ORDER — LORAZEPAM 2 MG/ML IJ SOLN
2.0000 mg | Freq: Once | INTRAMUSCULAR | Status: AC
  Administered 2023-05-10: 2 mg via INTRAMUSCULAR
  Filled 2023-05-10: qty 1

## 2023-05-10 MED ORDER — HALOPERIDOL LACTATE 5 MG/ML IJ SOLN
5.0000 mg | Freq: Once | INTRAMUSCULAR | Status: AC
  Administered 2023-05-10: 5 mg via INTRAMUSCULAR
  Filled 2023-05-10: qty 1

## 2023-05-10 MED ORDER — DIPHENHYDRAMINE HCL 50 MG/ML IJ SOLN
50.0000 mg | Freq: Once | INTRAMUSCULAR | Status: AC
Start: 2023-05-10 — End: 2023-05-10
  Administered 2023-05-10: 50 mg via INTRAMUSCULAR
  Filled 2023-05-10: qty 1

## 2023-05-10 MED ORDER — BENZOCAINE 10 % MT GEL
Freq: Four times a day (QID) | OROMUCOSAL | Status: DC | PRN
  Filled 2023-05-10: qty 9

## 2023-05-10 MED ORDER — ACETAMINOPHEN 500 MG PO TABS
1000.0000 mg | ORAL_TABLET | Freq: Once | ORAL | Status: AC
  Administered 2023-05-10: 1000 mg via ORAL

## 2023-05-10 NOTE — ED Notes (Signed)
 Pt dressed out into burgundy scrubs with multiple staff members in the rm. Pt belongings consist of: one red shirt, black/white shoes, 2 pairs of black pants, black boxers, 2 rubber bracekets, and black socks. Pt belongings placed into one pt belongings bag and labeled with pt name.

## 2023-05-10 NOTE — ED Notes (Signed)
ivc by MD Sung/psych consult ordered/pending.

## 2023-05-10 NOTE — ED Notes (Signed)
 IVC PENDING CONSULT UNABLE TO DO CONSULT DUE TO PATIENT ALERTNESS

## 2023-05-10 NOTE — ED Notes (Signed)
 Patient ambulated to room 1 from [name], he appears sleepy, ask for drink and chips, nurse did take him a snack just to find him sleeping on the bed with blanket pulled up. Staff will continue to monitor for safety.

## 2023-05-10 NOTE — ED Notes (Signed)
 Pt fell asleep while eating his breakfast tray. Pt awoken and encouraged to eat. Pt mumbling. Words incomprehensible at this time. [name]

## 2023-05-10 NOTE — ED Notes (Addendum)
 Pt given breakfast and juice by ED tech. Sitting up in bed with no distress noted at this time.

## 2023-05-10 NOTE — ED Notes (Signed)
 Patient requesting to leave.  Patient advised that he has to see psych provider due to him currently under IVC. Patient asking when he will see provider. Writer advised that providers have been trying to see him earlier in the day and unable to due to him sleeping.
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
## 2023-05-10 NOTE — ED Notes (Signed)
 Nurse received report from [employer]

## 2023-05-10 NOTE — ED Notes (Signed)
 Nurse received report from Cain Castillo RN prior to patient coming to room 1 BHU

## 2023-05-10 NOTE — ED Notes (Signed)
 Pt ambulatory to restroom with steady gait. Asking when he is getting his discharge papers. Pt informed he is not finished with the evaluation process since he has been asleep so long. Pt verbalized understanding and walked back to his bed. Pt provided with dinner tray and beverage.
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
## 2023-05-10 NOTE — BH Assessment (Signed)
@  2040, Patient was deferred to [facility] for a telepsych assessment. The assigned care coordinator will provide updates regarding the scheduling of the assessment. [facility] care coordinator can be reached at [phone] for further information on the timing of the telepsych evaluation.
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
## 2023-05-10 NOTE — ED Notes (Signed)
 Patient returned from CT at this time. Patient resting in bed with eyes closed. RR even and unlabored.

## 2023-05-10 NOTE — ED Notes (Addendum)
 Patient is resting comfortably. Will update vitals later.

## 2023-05-10 NOTE — ED Notes (Signed)
 ivc/pending consult/psych will assess when patient is more alert and able to participate in the assessment.

## 2023-05-10 NOTE — ED Notes (Signed)
 Patient willingly allowed this RN to administer IM medications. Patient currently calm and cooperative with staff. Patient states he will allow this RN to draw blood and escort him to CT scan.
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
## 2023-05-10 NOTE — ED Notes (Signed)
 Pt wheeled back to triage room by EDT, pt became verbally aggressive and agitated, stood out of wheelchair and began yelling at staff. Pt then stated "you know what watch this!", then proceeded to jump and bash his head into the wall. This RN attempted to get patient to stop and sit back down in wheelchair, pt continued to yell at staff, stated "and watch this!", then jumped and bashed his head into the wall again. Pt noted to stumble, however did not fall, then proceeded to yell "and this!", and jumped hitting his head into the wall for the 3rd time. Security and Allied arrived due to this RN and Joslyn, RN yelling at patient and attempting to redirect him. Pt able to be verbally de-escalated and directed into triage room and sat down. Charge RN notified that patient became physically aggressive and verbally aggressive with staff and would need immediate rooming. Pt able to sit calmly with Joslyn, RN, security, and allied in triage room. Awaiting a bed. MD Vallery Gavel notified of patient's actions.

## 2023-05-10 NOTE — ED Notes (Signed)
 Pt received pm snack. Snack placed on bedside due to pt being asleep

## 2023-05-10 NOTE — BH Assessment (Signed)
 This provider reached out to the ED physician to inquire whether the patient was appropriate for psychiatric evaluation at this time, given reports of aggressive behavior while in the ED. The ED physician informed this provider that the patient had been medicated and was currently sleeping. Based on the patient's current condition, psychiatry will  assess when patient is more alert and able to participate in the assessment.

## 2023-05-10 NOTE — ED Notes (Signed)
 Patient complaining of tooth pain on right side.  Patient asking for oral gel and tylenol . EDP Dr. Peggi Bowels made aware.

## 2023-05-10 NOTE — Consult Note (Signed)
 Patient received IM medication at 0200. He is unable to be awakened and is unable to participate in this psychiatric evaluation at this time. Will attempt to complete psychiatric evaluation once patient is alert.

## 2023-05-11 ENCOUNTER — Encounter: Payer: Self-pay | Admitting: Psychiatry

## 2023-05-11 DIAGNOSIS — R4689 Other symptoms and signs involving appearance and behavior: Secondary | ICD-10-CM

## 2023-05-11 DIAGNOSIS — F1490 Cocaine use, unspecified, uncomplicated: Secondary | ICD-10-CM | POA: Diagnosis not present

## 2023-05-11 DIAGNOSIS — F259 Schizoaffective disorder, unspecified: Secondary | ICD-10-CM

## 2023-05-11 DIAGNOSIS — R45851 Suicidal ideations: Secondary | ICD-10-CM | POA: Diagnosis not present

## 2023-05-11 HISTORY — DX: Other symptoms and signs involving appearance and behavior: R46.89

## 2023-05-11 MED ORDER — PENICILLIN V POTASSIUM 500 MG PO TABS
500.0000 mg | ORAL_TABLET | Freq: Once | ORAL | Status: AC
  Administered 2023-05-11: 500 mg via ORAL
  Filled 2023-05-11: qty 1

## 2023-05-11 MED ORDER — PENICILLIN V POTASSIUM 500 MG PO TABS: 500.0000 mg | ORAL_TABLET | Freq: Four times a day (QID) | ORAL | 0 refills | Status: DC
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
## 2023-05-11 NOTE — BH Assessment (Addendum)
@  0217: Per Yolonda Henderson, [facility] coordinator, "pt will be seen at 0430 by V. Sulema Endo, NP."  TTS confirmed  via secure chat and provided which cart will be used at the time of consult.

## 2023-05-11 NOTE — ED Provider Notes (Signed)
 5:17 AM  Pt seen by psychiatry.  They do not feel patient needs inpatient psychiatric treatment.  I will rescind his IVC.  Will discharge with outpatient resources.   Dariyah Garduno, Clover Dao, DO 05/11/23 [id number]
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
## 2023-05-11 NOTE — Discharge Instructions (Addendum)
You may alternate Tylenol 1000 mg every 6 hours as needed for pain, fever and Ibuprofen 800 mg every 6-8 hours as needed for pain, fever.  Please take Ibuprofen with food.  Do not take more than 4000 mg of Tylenol (acetaminophen) in a 24 hour period. ° °

## 2023-05-24 ENCOUNTER — Emergency Department

## 2023-05-24 ENCOUNTER — Other Ambulatory Visit: Payer: Self-pay

## 2023-05-24 ENCOUNTER — Emergency Department
Admission: EM | Admit: 2023-05-24 | Discharge: 2023-05-25 | Disposition: A | Discharge status: Other Acute Inpatient Hospital | Attending: Emergency Medicine | Admitting: Emergency Medicine

## 2023-05-24 DIAGNOSIS — S0990XA Unspecified injury of head, initial encounter: Secondary | ICD-10-CM | POA: Diagnosis present

## 2023-05-24 DIAGNOSIS — W228XXA Striking against or struck by other objects, initial encounter: Secondary | ICD-10-CM | POA: Diagnosis not present

## 2023-05-24 DIAGNOSIS — R456 Violent behavior: Secondary | ICD-10-CM | POA: Insufficient documentation

## 2023-05-24 DIAGNOSIS — Z79899 Other long term (current) drug therapy: Secondary | ICD-10-CM | POA: Diagnosis not present

## 2023-05-24 DIAGNOSIS — F1910 Other psychoactive substance abuse, uncomplicated: Secondary | ICD-10-CM | POA: Diagnosis not present

## 2023-05-24 DIAGNOSIS — F209 Schizophrenia, unspecified: Secondary | ICD-10-CM | POA: Insufficient documentation

## 2023-05-24 DIAGNOSIS — Z91148 Patient's other noncompliance with medication regimen for other reason: Secondary | ICD-10-CM | POA: Insufficient documentation

## 2023-05-24 LAB — ETHANOL: Alcohol, Ethyl (B): <15 mg/dL (ref <15)

## 2023-05-24 LAB — COMPREHENSIVE METABOLIC PANEL WITH GFR
ALT: 23 U/L (ref 0–44)
AST: 19 U/L (ref 15–41)
Albumin: 3.9 g/dL (ref 3.5–5.0)
Alkaline Phosphatase: 50 U/L (ref 38–126)
Anion gap: 6 (ref 5–15)
BUN: 16 mg/dL (ref 6–20)
CO2: 25 mmol/L (ref 22–32)
Calcium: 9.1 mg/dL (ref 8.9–10.3)
Chloride: 109 mmol/L (ref 98–111)
Creatinine, Ser: 0.85 mg/dL (ref 0.61–1.24)
GFR, Estimated: >60 mL/min (ref >60)
Glucose, Bld: 91 mg/dL (ref 70–99)
Potassium: 3.8 mmol/L (ref 3.5–5.1)
Sodium: 140 mmol/L (ref 135–145)
Total Bilirubin: 0.8 mg/dL (ref 0.0–1.2)
Total Protein: 7 g/dL (ref 6.5–8.1)

## 2023-05-24 LAB — CBC WITH DIFFERENTIAL/PLATELET
Abs Immature Granulocytes: 0.02 10*3/uL (ref 0.00–0.07)
Basophils Absolute: 0 10*3/uL (ref 0.0–0.1)
Basophils Relative: 0 %
Eosinophils Absolute: 0 10*3/uL (ref 0.0–0.5)
Eosinophils Relative: 1 %
HCT: 40.1 % (ref 39.0–52.0)
Hemoglobin: 13.7 g/dL (ref 13.0–17.0)
Immature Granulocytes: 0 %
Lymphocytes Relative: 40 %
Lymphs Abs: 2.5 10*3/uL (ref 0.7–4.0)
MCH: 29.3 pg (ref 26.0–34.0)
MCHC: 34.2 g/dL (ref 30.0–36.0)
MCV: 85.9 fL (ref 80.0–100.0)
Monocytes Absolute: 0.8 10*3/uL (ref 0.1–1.0)
Monocytes Relative: 12 %
Neutro Abs: 3 10*3/uL (ref 1.7–7.7)
Neutrophils Relative %: 47 %
Platelets: 231 10*3/uL (ref 150–400)
RBC: 4.67 MIL/uL (ref 4.22–5.81)
RDW: 12.7 % (ref 11.5–15.5)
WBC: 6.4 10*3/uL (ref 4.0–10.5)
nRBC: 0 % (ref 0.0–0.2)

## 2023-05-24 LAB — SALICYLATE LEVEL: Salicylate Lvl: <7 mg/dL — ABNORMAL LOW (ref 7.0–30.0)

## 2023-05-24 LAB — ACETAMINOPHEN LEVEL: Acetaminophen (Tylenol), Serum: <10 ug/mL — ABNORMAL LOW (ref 10–30)

## 2023-05-24 MED ORDER — DROPERIDOL 2.5 MG/ML IJ SOLN
5.0000 mg | Freq: Once | INTRAMUSCULAR | Status: AC
  Administered 2023-05-24: 5 mg via INTRAVENOUS
  Filled 2023-05-24: qty 2

## 2023-05-24 NOTE — ED Notes (Signed)
 Dinner tray provided for pt
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
## 2023-05-24 NOTE — ED Notes (Signed)
 Pt to door asking to use phone. Provided by security.

## 2023-05-24 NOTE — ED Notes (Signed)
 Snack and water placed at pts bedside.  Pt still asleep
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
## 2023-05-24 NOTE — ED Notes (Signed)
 Patient dressed out in hospital attire--- Belongings for pt: -black sweatshirt -white and red sneakers -blue phone -rings x3 -black pants
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
## 2023-05-24 NOTE — BH Assessment (Signed)
 TTS attempted to assess pt. Per RN, pt was too somnolent to engage in assessment. TTS will be notified once pt is awake and alert for assessment.

## 2023-05-24 NOTE — ED Notes (Signed)
 Pt sleeping during vital check.  Will obtain when he wakes up

## 2023-05-24 NOTE — ED Notes (Signed)
 Safety zone placed about pt self harm event that occurred on 05/24/23 at 0400.

## 2023-05-24 NOTE — ED Notes (Signed)
 Hospital meal provided.  100% consumed, pt tolerated w/o complaints.  Waste discarded appropriately.  Ambulates safely and independently.

## 2023-05-24 NOTE — Progress Notes (Signed)
 Patient was referred to the following facilities:   Destination  Service Provider Address Phone  [facility] [address], [place] [address] [phone]  [facility] [address], [place] [address] [phone]  [facility] [address] [place], [place] [address] [phone]  [facility] [address], [place] [address] [phone]  [facility] [address] [place] [address] [phone]  [facility] [address], [place] [address] [phone]  [facility] EFA[facility] [address] [place], [place] [phone]  [facility] [address] [place] [address] [phone]        Macky Sayres, [facility]
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
## 2023-05-24 NOTE — ED Notes (Signed)
 Transferred to [facility] via wheelchair by EDT and security. 1 bag of belongings sent to be locked in [place] locker room. Pt alert and oriented X4, cooperative, RR even and unlabored, color WNL. Pt in NAD.
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
## 2023-05-24 NOTE — ED Notes (Signed)
 Pt awake and knocking at door; asking for lunch tray. Provided by staff.

## 2023-05-24 NOTE — ED Notes (Signed)
 Pt provided with lunch tray and water. Pt resting with eyes closed with even respirations noted. Not easily aroused and does not keep eyes open for extended period of time. Tray set aside in nurses station until pt awakens and is able to eat.

## 2023-05-24 NOTE — ED Notes (Signed)
 Pt offered snack and water; pt resting with eyes closed and even respirations.
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
## 2023-05-24 NOTE — ED Notes (Addendum)
 MD at the bedside for evaluation of abrasion to the left side of pt head. Pt c/o pain but tolerated well.
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
## 2023-05-25 ENCOUNTER — Other Ambulatory Visit: Payer: Self-pay

## 2023-05-25 ENCOUNTER — Inpatient Hospital Stay (HOSPITAL_COMMUNITY)
Admission: AD | Admit: 2023-05-25 | Discharge: 2023-06-05 | DRG: 885 | Disposition: A | Discharge status: Home / Self Care | Source: Intra-hospital | Attending: Emergency Medicine | Admitting: Emergency Medicine

## 2023-05-25 ENCOUNTER — Encounter (HOSPITAL_COMMUNITY): Payer: Self-pay | Admitting: Psychiatry

## 2023-05-25 DIAGNOSIS — Z888 Allergy status to other drugs, medicaments and biological substances status: Secondary | ICD-10-CM | POA: Diagnosis not present

## 2023-05-25 DIAGNOSIS — R4587 Impulsiveness: Secondary | ICD-10-CM | POA: Diagnosis present

## 2023-05-25 DIAGNOSIS — Z79899 Other long term (current) drug therapy: Secondary | ICD-10-CM | POA: Diagnosis not present

## 2023-05-25 DIAGNOSIS — F200 Paranoid schizophrenia: Secondary | ICD-10-CM | POA: Diagnosis not present

## 2023-05-25 DIAGNOSIS — F209 Schizophrenia, unspecified: Principal | ICD-10-CM | POA: Diagnosis present

## 2023-05-25 DIAGNOSIS — F17200 Nicotine dependence, unspecified, uncomplicated: Secondary | ICD-10-CM | POA: Diagnosis present

## 2023-05-25 DIAGNOSIS — K0889 Other specified disorders of teeth and supporting structures: Secondary | ICD-10-CM | POA: Diagnosis present

## 2023-05-25 DIAGNOSIS — F2089 Other schizophrenia: Secondary | ICD-10-CM | POA: Diagnosis not present

## 2023-05-25 DIAGNOSIS — Z91148 Patient's other noncompliance with medication regimen for other reason: Secondary | ICD-10-CM

## 2023-05-25 DIAGNOSIS — F14129 Cocaine abuse with intoxication, unspecified: Secondary | ICD-10-CM | POA: Diagnosis present

## 2023-05-25 DIAGNOSIS — X58XXXA Exposure to other specified factors, initial encounter: Secondary | ICD-10-CM | POA: Diagnosis present

## 2023-05-25 DIAGNOSIS — I959 Hypotension, unspecified: Secondary | ICD-10-CM | POA: Diagnosis not present

## 2023-05-25 DIAGNOSIS — E538 Deficiency of other specified B group vitamins: Secondary | ICD-10-CM | POA: Diagnosis present

## 2023-05-25 DIAGNOSIS — F250 Schizoaffective disorder, bipolar type: Principal | ICD-10-CM | POA: Diagnosis present

## 2023-05-25 DIAGNOSIS — S0083XA Contusion of other part of head, initial encounter: Principal | ICD-10-CM

## 2023-05-25 DIAGNOSIS — S0990XA Unspecified injury of head, initial encounter: Secondary | ICD-10-CM | POA: Diagnosis present

## 2023-05-25 LAB — URINE DRUG SCREEN, QUALITATIVE (ARMC ONLY)
Amphetamines, Ur Screen: NOT DETECTED
Barbiturates, Ur Screen: NOT DETECTED
Benzodiazepine, Ur Scrn: POSITIVE — AB
Cannabinoid 50 Ng, Ur ~~LOC~~: NOT DETECTED
Cocaine Metabolite,Ur ~~LOC~~: POSITIVE — AB
MDMA (Ecstasy)Ur Screen: NOT DETECTED
Methadone Scn, Ur: NOT DETECTED
Opiate, Ur Screen: NOT DETECTED
Phencyclidine (PCP) Ur S: NOT DETECTED
Tricyclic, Ur Screen: NOT DETECTED

## 2023-05-25 MED ORDER — HALOPERIDOL LACTATE 5 MG/ML IJ SOLN
5.0000 mg | Freq: Three times a day (TID) | INTRAMUSCULAR | Status: DC | PRN
  Filled 2023-05-25: qty 1

## 2023-05-25 MED ORDER — ARIPIPRAZOLE 10 MG PO TABS
10.0000 mg | ORAL_TABLET | Freq: Every day | ORAL | Status: DC
  Administered 2023-05-26: 10 mg via ORAL
  Filled 2023-05-25 (×3): qty 1

## 2023-05-25 MED ORDER — DIPHENHYDRAMINE HCL 50 MG/ML IJ SOLN
50.0000 mg | Freq: Three times a day (TID) | INTRAMUSCULAR | Status: DC | PRN
  Filled 2023-05-25 (×2): qty 1

## 2023-05-25 MED ORDER — ARIPIPRAZOLE 10 MG PO TABS
10.0000 mg | ORAL_TABLET | Freq: Every day | ORAL | Status: DC
  Administered 2023-05-25: 10 mg via ORAL
  Filled 2023-05-25: qty 1

## 2023-05-25 MED ORDER — ACETAMINOPHEN 325 MG PO TABS
650.0000 mg | ORAL_TABLET | Freq: Four times a day (QID) | ORAL | Status: DC | PRN
  Administered 2023-05-25 – 2023-05-27 (×3): 650 mg via ORAL
  Filled 2023-05-25 (×3): qty 2

## 2023-05-25 MED ORDER — DIPHENHYDRAMINE HCL 50 MG/ML IJ SOLN
50.0000 mg | Freq: Three times a day (TID) | INTRAMUSCULAR | Status: DC | PRN
  Administered 2023-05-27 – 2023-06-02 (×2): 50 mg via INTRAMUSCULAR
  Filled 2023-05-25 (×2): qty 1

## 2023-05-25 MED ORDER — HYDROXYZINE HCL 25 MG PO TABS: 25.0000 mg | ORAL_TABLET | Freq: Three times a day (TID) | ORAL | Status: DC | PRN

## 2023-05-25 MED ORDER — LORAZEPAM 2 MG/ML IJ SOLN
2.0000 mg | Freq: Three times a day (TID) | INTRAMUSCULAR | Status: DC | PRN
  Filled 2023-05-25 (×3): qty 1

## 2023-05-25 MED ORDER — HALOPERIDOL LACTATE 5 MG/ML IJ SOLN
10.0000 mg | Freq: Three times a day (TID) | INTRAMUSCULAR | Status: DC | PRN
  Administered 2023-05-27 – 2023-06-02 (×2): 10 mg via INTRAMUSCULAR
  Filled 2023-05-25 (×3): qty 2

## 2023-05-25 MED ORDER — TRAZODONE HCL 50 MG PO TABS
50.0000 mg | ORAL_TABLET | Freq: Every evening | ORAL | Status: DC | PRN
  Administered 2023-05-25: 50 mg via ORAL
  Filled 2023-05-25: qty 1

## 2023-05-25 MED ORDER — HALOPERIDOL 5 MG PO TABS
5.0000 mg | ORAL_TABLET | Freq: Three times a day (TID) | ORAL | Status: DC | PRN
  Administered 2023-05-25: 5 mg via ORAL
  Filled 2023-05-25: qty 1

## 2023-05-25 MED ORDER — DIPHENHYDRAMINE HCL 25 MG PO CAPS
50.0000 mg | ORAL_CAPSULE | Freq: Three times a day (TID) | ORAL | Status: DC | PRN
  Administered 2023-05-25: 50 mg via ORAL
  Filled 2023-05-25: qty 2

## 2023-05-25 MED ORDER — HYDROXYZINE HCL 25 MG PO TABS
25.0000 mg | ORAL_TABLET | Freq: Three times a day (TID) | ORAL | Status: DC | PRN
  Administered 2023-05-25: 25 mg via ORAL
  Filled 2023-05-25: qty 1

## 2023-05-25 MED ORDER — LORAZEPAM 2 MG/ML IJ SOLN
2.0000 mg | Freq: Three times a day (TID) | INTRAMUSCULAR | Status: DC | PRN
  Administered 2023-05-26 – 2023-06-02 (×3): 2 mg via INTRAMUSCULAR
  Filled 2023-05-25: qty 1

## 2023-05-25 MED ORDER — ALUM & MAG HYDROXIDE-SIMETH 200-200-20 MG/5ML PO SUSP
30.0000 mL | ORAL | Status: DC | PRN
  Administered 2023-06-05 (×2): 30 mL via ORAL
  Filled 2023-05-25 (×2): qty 30

## 2023-05-25 MED ORDER — MAGNESIUM HYDROXIDE 400 MG/5ML PO SUSP: 30.0000 mL | Freq: Every day | ORAL | Status: DC | PRN

## 2023-05-25 MED ORDER — BENZOCAINE 10 % MT GEL
Freq: Three times a day (TID) | OROMUCOSAL | Status: DC | PRN
Start: 2023-05-25 — End: 2023-05-25
  Filled 2023-05-25: qty 9

## 2023-05-25 NOTE — Plan of Care (Signed)
   Problem: Education: Goal: Emotional status will improve Outcome: Progressing Goal: Mental status will improve Outcome: Progressing   Problem: Activity: Goal: Interest or engagement in activities will improve Outcome: Progressing Goal: Sleeping patterns will improve Outcome: Progressing   Problem: Safety: Goal: Periods of time without injury will increase Outcome: Progressing

## 2023-05-25 NOTE — Progress Notes (Signed)
 Pt up appearing confused, pt given PRN confusion protocol per [facility]

## 2023-05-25 NOTE — Progress Notes (Addendum)
 Per [facility], pt has been accepted to: [facility] on 05/25/2023 with Bed Assignment: 403-2  Pt meets inpatient criteria per:  Gilman Lade, NP  Attending Physician will be: Linnie Riches, MD  Report can be called to:  [phone]  Pt can arrive:  Pending  Care Team Notified:  Gilman Lade, NP; Sharion Davidson, RN; Darleen Ee, RN    [place], [facility] [phone]

## 2023-05-25 NOTE — ED Notes (Signed)
 Pt continues to be calm and cooperative this a.m. Mr Stupar seemed insightful when discussing his actions and presentation the prior day.  During nursing assessment Mr Flaugh was A/Ox 4 .  He  stated that he is not currently currently have thoughts or feelings of SI/HI.  Mr Harnage reported that he has "episodes" of auditory or visual hallucinations however "right this second I'm good".  Pt affect is congruent with situation , eye contact is good , speech is of normal rate and volume  with appropriate verbiage noted.  Staff addressed any feelings or concerns that have been brought up.  Medications were administered as ordered. Continue to monitor patient as ordered for any changes in behaviors and for continued safety.

## 2023-05-25 NOTE — ED Notes (Signed)
 EMTALA reviewed by charge RN

## 2023-05-25 NOTE — ED Provider Notes (Signed)
 Emergency Medicine Observation Re-evaluation Note  [name] is a 33 y.o. male, seen on rounds today.  Pt initially presented to the ED for complaints of Aggressive Behavior Currently, the patient is sleeping.  Physical Exam  BP 106/85   Pulse 67   Temp 98.1 F (36.7 C) (Oral)   Resp 16   Ht 5\' 11"  (1.803 m)   Wt 67.2 kg   SpO2 98%   BMI 20.66 kg/m  Physical Exam   Resting without distress.  ED Course / MDM   I have reviewed the labs performed to date as well as medications administered while in observation.  Recent changes in the last 24 hours include interim reevaluation by provider and CT scan of the head..  Plan  Current plan is for psychiatric disposition.  Currently under IVC.    Iver Marker, MD 05/25/23 [id number]

## 2023-05-25 NOTE — ED Notes (Signed)
[place]  [facility]  DEPT  CALLED  FOR  TRANSPORT  TO [facility] ?

## 2023-05-25 NOTE — [facility] Group Notes (Signed)
 [facility] Group Notes:  (Nursing/MHT/Case Management/Adjunct)  Date:  05/25/2023  Time:  2000  Type of Therapy:  Wrap up group  Participation Level:  Active  Participation Quality:  Appropriate, Attentive, Sharing, and Supportive  Affect:  Depressed  Cognitive:  Disorganized  Insight:  Limited  Engagement in Group:  Developing/Improving  Modes of Intervention:  Clarification, Education, and Socialization  Summary of Progress/Problems:  Positive thinking and self-care were discussed.   Catharine Clock 05/25/2023, 9:41 PM

## 2023-05-25 NOTE — Progress Notes (Signed)
 Tried to do EKG tonight , pt refused  and said he would do ti tomorrow

## 2023-05-25 NOTE — ED Notes (Signed)
 Ivc /inpatient psych admission when medically cleared

## 2023-05-25 NOTE — ED Notes (Signed)
 Pt is A/Ox 4, Mr dicke was able to verbalize understanding to transfer to inpatient tx facility, and is agreeable. Report called to [facility], RN .  Pt left ambulatory via [facility].  All Belongings accounted for and given to [facility] for transport. Receiving facility notified on departure.

## 2023-05-25 NOTE — Progress Notes (Signed)
 Pt appears confused at times and stated"I'm confused" , pt has problem with his working memory     05/25/23 2200  Psych Admission Type (Psych Patients Only)  Admission Status Involuntary  Psychosocial Assessment  Patient Complaints Anxiety;Confusion  Eye Contact Fair;Suspiciousness  Facial Expression Anxious  Affect Anxious;Preoccupied  Speech Logical/coherent  Interaction Assertive  Motor Activity Slow  Appearance/Hygiene Unremarkable  Behavior Characteristics Cooperative  Mood Anxious;Suspicious;Preoccupied  Aggressive Behavior  Effect No apparent injury  Thought Process  Coherency Circumstantial  Content Blaming others  Delusions None reported or observed  Perception WDL  Hallucination None reported or observed  Judgment WDL  Confusion WDL  Danger to Self  Current suicidal ideation? Denies  Danger to Others  Danger to Others None reported or observed

## 2023-05-25 NOTE — Progress Notes (Signed)
 Admission Note:  66 yr AA male who presents IVC in no acute distress for the treatment of self harm, anxiety and Depression. Pt appears flat and depressed. Pt was calm and cooperative with admission process. Pt denied SI and contracts for safety upon admission. Pt denies AVH . Pt reports having an altercation with his girlfriend and impulsively started to bang his head on the wall. Pt denied that behavior was an attempt to harm himself, "just frustration".  Pt also denied any past medical except for Schizophrenia. 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  PT searched and no contraband found, POC and unit policies explained and understanding verbalized. Consents obtained. Food and fluids offered, and  accepted. Pt had no additional questions or concerns, he is placed on q 15 min rounds

## 2023-05-25 NOTE — Progress Notes (Signed)
 Pt requested Orajel , NP notified

## 2023-05-25 NOTE — ED Notes (Addendum)
Pt provided with dinner tray and beverage 

## 2023-05-25 NOTE — ED Notes (Signed)
 Report given to [facility] Pattie Borders, RN

## 2023-05-25 NOTE — ED Notes (Signed)
 Pt went to [facility]

## 2023-05-26 ENCOUNTER — Encounter (HOSPITAL_COMMUNITY): Payer: Self-pay

## 2023-05-26 ENCOUNTER — Encounter (HOSPITAL_COMMUNITY): Payer: Self-pay | Admitting: Psychiatry

## 2023-05-26 ENCOUNTER — Inpatient Hospital Stay (HOSPITAL_COMMUNITY)

## 2023-05-26 DIAGNOSIS — F200 Paranoid schizophrenia: Secondary | ICD-10-CM

## 2023-05-26 LAB — VITAMIN D 25 HYDROXY (VIT D DEFICIENCY, FRACTURES): Vit D, 25-Hydroxy: 39.67 ng/mL (ref 30–100)

## 2023-05-26 LAB — VITAMIN B12: Vitamin B-12: 177 pg/mL — ABNORMAL LOW (ref 180–914)

## 2023-05-26 LAB — FOLATE: Folate: 5.7 ng/mL — ABNORMAL LOW (ref >5.9)

## 2023-05-26 LAB — TSH: TSH: 1.178 u[IU]/mL (ref 0.350–4.500)

## 2023-05-26 MED ORDER — TRAZODONE HCL 50 MG PO TABS
50.0000 mg | ORAL_TABLET | Freq: Every day | ORAL | Status: DC
  Administered 2023-05-26 – 2023-05-27 (×2): 50 mg via ORAL
  Filled 2023-05-26 (×5): qty 1

## 2023-05-26 MED ORDER — ARIPIPRAZOLE 15 MG PO TABS
15.0000 mg | ORAL_TABLET | Freq: Every day | ORAL | Status: DC
  Administered 2023-05-27: 15 mg via ORAL
  Filled 2023-05-26 (×2): qty 1

## 2023-05-26 MED ORDER — HYDROXYZINE HCL 50 MG PO TABS
50.0000 mg | ORAL_TABLET | Freq: Three times a day (TID) | ORAL | Status: DC | PRN
  Administered 2023-05-26 – 2023-06-01 (×7): 50 mg via ORAL
  Filled 2023-05-26 (×8): qty 1

## 2023-05-26 MED ORDER — BENZOCAINE 10 % MT GEL
Freq: Four times a day (QID) | OROMUCOSAL | Status: DC | PRN
  Administered 2023-06-01 – 2023-06-04 (×2): 1 via OROMUCOSAL
  Filled 2023-05-26 (×2): qty 9

## 2023-05-26 NOTE — Group Note (Signed)
 Date:  05/26/2023 Time:  2:00 PM  Group Topic/Focus:  Emotional Education:   The focus of this group is to discuss unhealthy thought patterns and how they impact mental health.     Participation Level:  Did Not Attend   Sheryl Donna 05/26/2023, 2:00 PM

## 2023-05-26 NOTE — Progress Notes (Signed)
 Pt up being disruptive on the unit. Pt on the unit complaing of H/A , pt informed that he was just given Tylenol  per [facility] for HA also Haldol  and Benadryl  for Confusion  per [facility] , but pt continued to escalate and not listen to Clinical research associate , Pharmacist, hospital and threatening to hurt himself. Writer tried to assess pt but pt continued to yell profanities and racial slurs toward staff. Writer continued to Curator , Clinical research associate walked away and sat behind the nursing station while the charge nurse tried to talk to pt. Pt continued yelling profanities and racial slurs while continuing to target this Clinical research associate "you don't know who I am , you're a punk ass bitch, I'm show you who I am" the charge nurse tried to re-direct him , but he would not listen to staff. Pt threatened to hit his head on the wall if he did not get what he wanted"I'm hit my head on the wall so hard I'm knock myself out this is what I do" Pt hit his head on the wall , STARR was activated and pt ran towards another wall and hit his head again . Pt was placed in a manual hold and transferred to the chair. Pt was given IM Ativan  2 mg .  When pt was in the manual hold writer tried to wrap his leg around writer's leg while yelling obscenities. While pt was being placed in the chair pt continued to curse at Clinical research associate and tried to Scientific laboratory technician also. Pt was escorted to the quiet room in the restraint chair. Np on call notified of restraint event and pt hitting his head and ordered pt to go to the ED for medical clearance.     0005-manual ho;d 0008-restraint chair 0015- 2 mg Ativan  IM per MAR 0120- L leg , L arm released 0125 - out of restraint 0130- to the ED

## 2023-05-26 NOTE — H&P (Signed)
 Psychiatric Admission Assessment Adult  Patient Identification: [name]  MRN:  [id number]  Date of Evaluation:  05/26/23  Chief Complaint:  Schizoaffective disorder, bipolar type (HCC) [F25.0] Schizophrenia (HCC) [F20.9] Contusion of other part of head, initial encounter [S00.83XA]   Principal Diagnosis: Schizophrenia (HCC)  Diagnosis:  Principal Problem:   Schizophrenia (HCC) Active Problems:   Schizoaffective disorder, bipolar type (HCC)    Chief Complaint: "To leave!"   History of Present Illness: [name] is a 33 y.o. who  has a past medical history of Schizo affective schizophrenia (HCC) and Schizophrenia (HCC).  He presented to [facility] for Schizophrenia (HCC).  Patient reportedly got into an argument with his family.  Law enforcement was called and he was combative with Patent examiner.  He was reportedly banging his head on the wall at the home, leading to a scalp abrasion.  He was admitted to [facility] behavioral health hospital, became agitated and banged his head again and was subsequently sent back to the ED.  He was medically cleared and returned back to our unit.  The patient was minimally engaged in the interview and refused to answer most questions asked.  He tells me that he got a fight with his girlfriend that he has an anger problem.  He abruptly stopped talking at 1 point and drooled on himself.  He presents as paranoid and disorganized in thought.  We discussed increasing Abilify  to which she was amenable.   Past Psychiatric History: He  has a past medical history of Schizo affective schizophrenia (HCC) and Schizophrenia (HCC).   Is the patient at risk to self?  Yes Has the patient been a risk to self in the past 6 months? No Has the patient been a risk to self within the distant past? No Is the patient a risk to others? No Has the patient been a risk to others in the past 6 months? No Has the patient been a risk to others within the distant past?  No  [place] Scale:  Flowsheet Row ED to Hosp-Admission (Current) [facility] from 05/24/2023 in [facility] at [facility] from 05/10/2023 in [facility]  C-SSRS RISK CATEGORY No Risk No Risk No Risk          Prior Inpatient Therapy: Yes Prior Outpatient Therapy: Yes  Alcohol Screening:  1. How often do you have a drink containing alcohol?: Never 2. How many drinks containing alcohol do you have on a typical day when you are drinking?: 1 or 2 3. How often do you have six or more drinks on one occasion?: Never AUDIT-C Score: 0 4. How often during the last year have you found that you were not able to stop drinking once you had started?: Never 5. How often during the last year have you failed to do what was normally expected from you because of drinking?: Never 6. How often during the last year have you needed a first drink in the morning to get yourself going after a heavy drinking session?: Never 7. How often during the last year have you had a feeling of guilt of remorse after drinking?: Never 8. How often during the last year have you been unable to remember what happened the night before because you had been drinking?: Never 9. Have you or someone else been injured as a result of your drinking?: No 10. Has a relative or friend or  a doctor or another health worker been concerned about your drinking or suggested you cut down?: No Alcohol Use Disorder Identification Test Final Score (AUDIT): 0 Alcohol Brief Interventions/Follow-up: Alcohol education/Brief advice  Substance Abuse History in the last 12 months: Yes Consequences of Substance Abuse: NA  Previous Psychotropic Medications: Yes Psychological Evaluations: No  Past Medical History:  Past Medical History:  Diagnosis Date   Schizo affective schizophrenia (HCC)    Schizophrenia [facility])      Family  Psychiatric & Medical History: Unable to obtain as the patient was not cooperative with the interview History reviewed. No pertinent family history.   Tobacco Screening:  Social History   Tobacco Use  Smoking Status Every Day  Smokeless Tobacco Never      Social History:  Social History   Substance and Sexual Activity  Alcohol Use Not Currently      Additional Social History:       Allergies:   Allergies  Allergen Reactions   Ibuprofen Swelling    Tongue swelling   Risperidone And Related Other (See Comments) and Swelling    gynecomastia gynecomastia    Ziprasidone  Swelling    Tongue swells   Benztropine  Other (See Comments)    Causes confusion, depression, and delusions   Quetiapine  Other (See Comments)    Depression, suicidality, adverse effect: seizures      Lab Results:  No results found for this or any previous visit (from the past 48 hours).   Blood Alcohol level:  Lab Results  Component Value Date   [facility] <15 05/24/2023   ETH <15 05/10/2023    Metabolic Disorder Labs:  No results found for: "HGBA1C", "MPG" No results found for: "PROLACTIN"  Lab Results  Component Value Date   CHOL 121 11/30/2018   TRIG 102 11/30/2018   HDL 39 (L) 11/30/2018   VLDL 20 11/30/2018   LDLCALC 62 11/30/2018      Current Medications: Current Facility-Administered Medications  Medication Dose Route Frequency Provider Last Rate Last Admin   acetaminophen  (TYLENOL ) tablet 650 mg  650 mg Oral Q6H PRN Gilman Lade, NP   650 mg at 05/25/23 2342   alum & mag hydroxide-simeth (MAALOX/MYLANTA) 200-200-20 MG/5ML suspension 30 mL  30 mL Oral Q4H PRN Gilman Lade, NP       [START ON 05/27/2023] ARIPiprazole  (ABILIFY ) tablet 15 mg  15 mg Oral Daily Jacaden Forbush S, MD       benzocaine  (ORAJEL) 10 % mucosal gel   Mouth/Throat QID PRN Dorthea Gauze, NP       haloperidol  (HALDOL ) tablet 5 mg  5 mg Oral TID PRN Gilman Lade, NP   5 mg at 05/25/23 2342    And   diphenhydrAMINE  (BENADRYL ) capsule 50 mg  50 mg Oral TID PRN Gilman Lade, NP   50 mg at 05/25/23 2341   haloperidol  lactate (HALDOL ) injection 5 mg  5 mg Intramuscular TID PRN Gilman Lade, NP       And   diphenhydrAMINE  (BENADRYL ) injection 50 mg  50 mg Intramuscular TID PRN Gilman Lade, NP       And   LORazepam  (ATIVAN ) injection 2 mg  2 mg Intramuscular TID PRN Gilman Lade, NP       haloperidol  lactate (HALDOL ) injection 10 mg  10 mg Intramuscular TID PRN Gilman Lade, NP       And   diphenhydrAMINE  (BENADRYL ) injection 50 mg  50 mg Intramuscular TID PRN Elston Halsted  M, NP       And   LORazepam  (ATIVAN ) injection 2 mg  2 mg Intramuscular TID PRN Gilman Lade, NP   2 mg at 05/26/23 0015   hydrOXYzine  (ATARAX ) tablet 50 mg  50 mg Oral TID PRN Darely Becknell S, MD       magnesium  hydroxide (MILK OF MAGNESIA) suspension 30 mL  30 mL Oral Daily PRN Gilman Lade, NP       traZODone  (DESYREL ) tablet 50 mg  50 mg Oral QHS Aleksi Brummet S, MD        PTA Medications: Medications Prior to Admission  Medication Sig Dispense Refill Last Dose/Taking   penicillin  v potassium (VEETID) 500 MG tablet Take 1 tablet (500 mg total) by mouth 4 (four) times daily. (Patient not taking: Reported on 05/24/2023) 40 tablet 0      Musculoskeletal: Strength & Muscle Tone: within normal limits Gait & Station: normal Patient leans: N/A    Psychiatric Specialty Exam:  Presentation  General Appearance: Disheveled  Eye Contact: Poor  Speech: Blocked  Speech Volume: Decreased  Handedness: Right   Mood and Affect  Mood: Dysphoric  Affect: Restricted; Depressed   Thought Process  Thought Processes: Disorganized  Descriptions of Associations: Tangential  Orientation: Full (Time, Place and Person)  Thought Content: Delusions; Tangential  History of Schizophrenia/Schizoaffective disorder: Yes  Duration of Psychotic  Symptoms: NA Hallucinations: Hallucinations: Auditory  Ideas of Reference: None  Suicidal Thoughts: Suicidal Thoughts: No  Homicidal Thoughts: Homicidal Thoughts: No   Sensorium  Memory: Immediate Good  Judgment: Poor  Insight: Poor   Executive Functions  Concentration: Fair  Attention Span: Fair  Recall: Good  Fund of Knowledge: Good  Language: Good   Psychomotor Activity  Psychomotor Activity: Psychomotor Activity: Normal   Assets  Assets: Communication Skills; Physical Health   Sleep  Sleep: Sleep: Poor    Physical Exam: General: Sitting comfortably. NAD. HEENT: Normocephalic, atraumatic, MMM, EMOI Lungs: no increased work of breathing noted Heart: no cyanosis Abdomen: Non distended Musculoskeletal: FROM. No obvious deformities Skin: Warm, dry, intact. No rashes noted Neuro: No obvious focal deficits.  Gait and station are normal  Review of Systems  Constitutional: Negative.   HENT: Negative.    Eyes: Negative.   Respiratory: Negative.    Cardiovascular: Negative.   Gastrointestinal: Negative.   Genitourinary: Negative.   Skin: Negative.   Neurological: Negative.   Psychiatric/Behavioral:  Positive for paranoia.     Blood pressure 97/68, pulse 63, temperature 97.9 F (36.6 C), temperature source Axillary, resp. rate 18, height 5\' 11"  (1.803 m), weight 67.2 kg, SpO2 100%. Body mass index is 20.66 kg/m.   Treatment Plan Summary: ASSESSMENT: Tae S Arico is an 33 y.o. male who  has a past medical history of Schizo affective schizophrenia (HCC) and Schizophrenia (HCC).  He presented on 05/25/2023  6:24 PM for Schizophrenia (HCC).    Diagnoses / Active Problems: Patient Active Problem List   Diagnosis Date Noted   Schizoaffective disorder, bipolar type (HCC) 05/25/2023   Schizophrenia (HCC) 05/25/2023   Cocaine use 05/11/2023   Aggressive behavior 05/11/2023   Cocaine abuse (HCC) 05/18/2022   Schizoaffective disorder (HCC) 06/27/2019    Schizophrenia, paranoid (HCC) 03/12/2019   Cocaine abuse with intoxication (HCC)    Self-inflicted laceration of right wrist (HCC) 11/30/2018   Suicidal ideation 11/29/2018   Cocaine abuse with cocaine-induced mood disorder (HCC) 11/06/2018   Agitation    Cannabis abuse 11/10/2017     PLAN: Safety  and Monitoring:  -- Involuntary admission to inpatient psychiatric unit for safety, stabilization and treatment  -- Daily contact with patient to assess and evaluate symptoms and progress in treatment  -- Patient's case to be discussed in multi-disciplinary team meeting  -- Observation Level : q15 minute checks  -- Vital signs:  q12 hours  -- Precautions: suicide, elopement, and assault  2. 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/06/2018   Agitation    Cannabis abuse 11/10/2017     Scheduled Medications:  [START ON 05/27/2023] ARIPiprazole   15 mg Oral Daily   traZODone   50 mg Oral QHS     As Needed Medications: acetaminophen , alum & mag hydroxide-simeth, benzocaine , haloperidol  **AND** diphenhydrAMINE , haloperidol  lactate **AND** diphenhydrAMINE  **AND** LORazepam , haloperidol  lactate **AND** diphenhydrAMINE  **AND** LORazepam , hydrOXYzine , magnesium  hydroxide    3. Medical Issues Being Addressed:    Labs reviewed, unremarkable with the exception of: Urine drug screen positive for benzodiazepines and cocaine, intake labs pending   Tobacco Use Disorder  --  Patient does not  need nicotine replacement  -- Smoking cessation encouraged  4. Discharge Planning:   -- Social work and case management to assist with discharge planning and identification of hospital follow-up needs prior to discharge  -- Estimated LOS: 5-7 days  -- Discharge Concerns: Need to establish a safety plan; Medication compliance and effectiveness  -- Discharge Goals: Return home with outpatient referrals for mental health follow-up including medication management/psychotherapy  5. Short Term Goals:  Improve ability to identify changes in lifestyle to reduce recurrence of condition, verbalize feelings, disclose and discuss suicidal ideas, demonstrate self-control, identify and develop effective coping behaviors, compliance with prescribed medications, identify triggers associated with substance abuse/mental health issues, participate in unit milieu and in scheduled group therapies   6. Long Term Goals: Improvement in symptoms so the patient is ready for discharge   --The risks/benefits/side-effects/alternatives to the medications above were discussed in detail with the patient and time was given for questions. The patient provided informed consent.   -- Metabolic profile and EKG monitoring obtained while on an atypical antipsychotic and listed in the EHR    Total Time Spent in Direct Patient Care:  I personally spent 40 minutes on the unit in direct patient care. The direct patient care time included face-to-face time with the patient, reviewing the patient's chart, communicating with other professionals, and coordinating care. Greater than 50% of this time was spent in counseling or coordinating care with the patient regarding goals of hospitalization, psycho-education, and discharge planning needs.   I certify that inpatient services furnished can reasonably be expected to improve the patient's condition.    Clair Crews, MD 05/26/2023, 1:20 PM      Portions of this note were created  using voice recognition software. Minor syntax errors, grammatical content, spelling, or punctuation errors may have occurred unintentionally. Please notify the [facility] if the meaning of any statement is unclear.

## 2023-05-26 NOTE — BH IP Treatment Plan (Signed)
 Interdisciplinary Treatment and Diagnostic Plan Update  05/26/2023 Time of Session: 1045am [name] MRN: [id number]  Principal Diagnosis: Schizophrenia [facility])  Secondary Diagnoses: Principal Problem:   Schizophrenia (HCC) Active Problems:   Schizoaffective disorder, bipolar type (HCC)   Current Medications:  Current Facility-Administered Medications  Medication Dose Route Frequency Provider Last Rate Last Admin   acetaminophen  (TYLENOL ) tablet 650 mg  650 mg Oral Q6H PRN Gilman Lade, NP   650 mg at 05/25/23 2342   alum & mag hydroxide-simeth (MAALOX/MYLANTA) 200-200-20 MG/5ML suspension 30 mL  30 mL Oral Q4H PRN Gilman Lade, NP       [START ON 05/27/2023] ARIPiprazole  (ABILIFY ) tablet 15 mg  15 mg Oral Daily Parker, Alvin S, MD       benzocaine  (ORAJEL) 10 % mucosal gel   Mouth/Throat QID PRN Dorthea Gauze, NP       haloperidol  (HALDOL ) tablet 5 mg  5 mg Oral TID PRN Gilman Lade, NP   5 mg at 05/25/23 2342   And   diphenhydrAMINE  (BENADRYL ) capsule 50 mg  50 mg Oral TID PRN Gilman Lade, NP   50 mg at 05/25/23 2341   haloperidol  lactate (HALDOL ) injection 5 mg  5 mg Intramuscular TID PRN Gilman Lade, NP       And   diphenhydrAMINE  (BENADRYL ) injection 50 mg  50 mg Intramuscular TID PRN Gilman Lade, NP       And   LORazepam  (ATIVAN ) injection 2 mg  2 mg Intramuscular TID PRN Gilman Lade, NP       haloperidol  lactate (HALDOL ) injection 10 mg  10 mg Intramuscular TID PRN Gilman Lade, NP       And   diphenhydrAMINE  (BENADRYL ) injection 50 mg  50 mg Intramuscular TID PRN Gilman Lade, NP       And   LORazepam  (ATIVAN ) injection 2 mg  2 mg Intramuscular TID PRN Gilman Lade, NP   2 mg at 05/26/23 0015   hydrOXYzine  (ATARAX ) tablet 50 mg  50 mg Oral TID PRN Parker, Alvin S, MD       magnesium  hydroxide (MILK OF MAGNESIA) suspension 30 mL  30 mL Oral Daily PRN Lorrene Rosser, Veronique M, NP        traZODone  (DESYREL ) tablet 50 mg  50 mg Oral QHS Parker, Alvin S, MD       PTA Medications: Medications Prior to Admission  Medication Sig Dispense Refill Last Dose/Taking   penicillin  v potassium (VEETID) 500 MG tablet Take 1 tablet (500 mg total) by mouth 4 (four) times daily. (Patient not taking: Reported on 05/24/2023) 40 tablet 0     Patient Stressors:    Patient Strengths:    Treatment Modalities: Medication Management, Group therapy, Case management,  1 to 1 session with clinician, Psychoeducation, Recreational therapy.   Physician Treatment Plan for Primary Diagnosis: Schizophrenia (HCC) Long Term Goal(s):     Short Term Goals:    Medication Management: Evaluate patient's response, side effects, and tolerance of medication regimen.  Therapeutic Interventions: 1 to 1 sessions, Unit Group sessions and Medication administration.  Evaluation of Outcomes: Not Progressing  Physician Treatment Plan for Secondary Diagnosis: Principal Problem:   Schizophrenia (HCC) Active Problems:   Schizoaffective disorder, bipolar type (HCC)  Long Term Goal(s):     Short Term Goals:       Medication Management: Evaluate patient's response, side effects, and tolerance of medication regimen.  Therapeutic Interventions: 1 to 1 sessions,  Unit Group sessions and Medication administration.  Evaluation of Outcomes: Not Progressing   RN Treatment Plan for Primary Diagnosis: Schizophrenia (HCC) Long Term Goal(s): Knowledge of disease and therapeutic regimen to maintain health will improve  Short Term Goals: Ability to remain free from injury will improve, Ability to verbalize frustration and anger appropriately will improve, Ability to demonstrate self-control, Ability to participate in decision making will improve, Ability to verbalize feelings will improve, Ability to disclose and discuss suicidal ideas, Ability to identify and develop effective coping behaviors will improve, and Compliance  with prescribed medications will improve  Medication Management: RN will administer medications as ordered by provider, will assess and evaluate patient's response and provide education to patient for prescribed medication. RN will report any adverse and/or side effects to prescribing provider.  Therapeutic Interventions: 1 on 1 counseling sessions, Psychoeducation, Medication administration, Evaluate responses to treatment, Monitor vital signs and CBGs as ordered, Perform/monitor CIWA, COWS, AIMS and Fall Risk screenings as ordered, Perform wound care treatments as ordered.  Evaluation of Outcomes: Not Progressing   LCSW Treatment Plan for Primary Diagnosis: Schizophrenia (HCC) Long Term Goal(s): Safe transition to appropriate next level of care at discharge, Engage patient in therapeutic group addressing interpersonal concerns.  Short Term Goals: Engage patient in aftercare planning with referrals and resources, Increase social support, Increase ability to appropriately verbalize feelings, Increase emotional regulation, Facilitate acceptance of mental health diagnosis and concerns, Facilitate patient progression through stages of change regarding substance use diagnoses and concerns, Identify triggers associated with mental health/substance abuse issues, and Increase skills for wellness and recovery  Therapeutic Interventions: Assess for all discharge needs, 1 to 1 time with Social worker, Explore available resources and support systems, Assess for adequacy in community support network, Educate family and significant other(s) on suicide prevention, Complete Psychosocial Assessment, Interpersonal group therapy.  Evaluation of Outcomes: Not Progressing   Progress in Treatment: Attending groups: No. Participating in groups: No. Taking medication as prescribed: Yes. Toleration medication: Yes. Family/Significant other contact made: No, will contact:  consents pending Patient understands  diagnosis: Yes. Discussing patient identified problems/goals with staff: No. Medical problems stabilized or resolved: Yes. Denies suicidal/homicidal ideation: Yes. Issues/concerns per patient self-inventory: No.  New problem(s) identified: No, Describe:  none  New Short Term/Long Term Goal(s): detox, medication management for mood stabilization; elimination of SI thoughts; development of comprehensive mental wellness/sobriety plan   Patient Goals:  "Discharge and work on my anger"  Discharge Plan or Barriers: Patient recently admitted. CSW will continue to follow and assess for appropriate referrals and possible discharge planning.    Reason for Continuation of Hospitalization: Depression Medication stabilization Suicidal ideation Withdrawal symptoms  Estimated Length of Stay: 5-7 days  Last 3 [place] Suicide Severity Risk Score: Flowsheet Row ED to Hosp-Admission (Current) [facility] from [facility]24/2023 in [facility] ED from 05/10/2023 in [facility] Emergency Department at [facility]  C-SSRS RISK CATEGORY No Risk No Risk No Risk       Last PHQ 2/9 Scores:     No data to display          Scribe for Treatment Team: Vonzell Guerin, LCSWA 05/26/2023 1:28 PM

## 2023-05-26 NOTE — Plan of Care (Signed)
   Problem: Safety: Goal: Periods of time without injury will increase Outcome: Progressing   Problem: Coping: Goal: Coping ability will improve Outcome: Progressing

## 2023-05-26 NOTE — ED Provider Notes (Signed)
 [place] EMERGENCY DEPARTMENT AT [facility] Provider Note   CSN: [id number] Arrival date & time: 05/26/23  0136     History  Chief Complaint  Patient presents with   Altered Mental Status   Aggressive Behavior    [name] is a 33 y.o. male.  The history is provided by the patient and medical records.  [name] is a 33 y.o. male who presents to the Emergency Department complaining of head injury.  He presents to the emergency department by [facility] under IVC from behavioral health hospital for evaluation for potential head injury.  He was at the behavioral health hospital when he became agitated and started striking his head on the wall.  He did require medications for sedation prior to ED arrival.  Patient is somnolent and unable to provide review of systems.       Home Medications Prior to Admission medications   Medication Sig Start Date End Date Taking? Authorizing Provider  penicillin  v potassium (VEETID) 500 MG tablet Take 1 tablet (500 mg total) by mouth 4 (four) times daily. Patient not taking: Reported on 05/24/2023 05/11/23   Ward, Clover Dao, DO      Allergies    Ibuprofen, Risperidone and related, Ziprasidone , Benztropine , and Quetiapine     Review of Systems   Review of Systems  All other systems reviewed and are negative.   Physical Exam Updated Vital Signs BP 97/68   Pulse 63   Temp 97.9 F (36.6 C) (Axillary)   Resp 18   Ht 5\' 11"  (1.803 m)   Wt 67.2 kg   SpO2 100%   BMI 20.66 kg/m  Physical Exam Vitals and nursing note reviewed.  Constitutional:      Appearance: He is well-developed.  HENT:     Head: Normocephalic.     Comments: Mild soft tissue swelling to the left temple Cardiovascular:     Rate and Rhythm: Normal rate and regular rhythm.     Heart sounds: No murmur heard. Pulmonary:     Effort: Pulmonary effort is normal. No respiratory distress.     Breath sounds: Normal breath sounds.  Abdominal:      Palpations: Abdomen is soft.     Tenderness: There is no abdominal tenderness. There is no guarding or rebound.  Musculoskeletal:        General: No tenderness.     Cervical back: Neck supple.  Skin:    General: Skin is warm and dry.  Neurological:     Mental Status: He is alert and oriented to person, place, and time.     Comments: Moves all extremities symmetrically.  Psychiatric:     Comments: Flat affect     ED Results / Procedures / Treatments   Labs (all labs ordered are listed, but only abnormal results are displayed) Labs Reviewed - No data to display  EKG None  Radiology CT Head Wo Contrast Result Date: 05/26/2023 CLINICAL DATA:  Head trauma, abnormal mental status (Age 38-64y) EXAM: CT HEAD WITHOUT CONTRAST TECHNIQUE: Contiguous axial images were obtained from the base of the skull through the vertex without intravenous contrast. RADIATION DOSE REDUCTION: This exam was performed according to the departmental dose-optimization program which includes automated exposure control, adjustment of the mA and/or kV according to patient size and/or use of iterative reconstruction technique. COMPARISON:  CT head 05/24/2023 FINDINGS: Brain: No evidence of large-territorial acute infarction. No parenchymal hemorrhage. No mass lesion. No extra-axial collection. No mass effect or midline shift.  No hydrocephalus. Basilar cisterns are patent. Vascular: No hyperdense vessel. Skull: No acute fracture or focal lesion. Sinuses/Orbits: Paranasal sinuses and mastoid air cells are clear. The orbits are unremarkable. Other: None. IMPRESSION: No acute intracranial abnormality. Electronically Signed   By: Morgane  Naveau M.D.   On: 05/26/2023 02:42    Procedures Procedures    Medications Ordered in ED Medications  haloperidol  (HALDOL ) tablet 5 mg (5 mg Oral Given 05/25/23 2342)    And  diphenhydrAMINE  (BENADRYL ) capsule 50 mg (50 mg Oral Given 05/25/23 2341)  haloperidol  lactate (HALDOL ) injection  5 mg (has no administration in time range)    And  diphenhydrAMINE  (BENADRYL ) injection 50 mg (has no administration in time range)    And  LORazepam  (ATIVAN ) injection 2 mg (has no administration in time range)  haloperidol  lactate (HALDOL ) injection 10 mg (has no administration in time range)    And  diphenhydrAMINE  (BENADRYL ) injection 50 mg (has no administration in time range)    And  LORazepam  (ATIVAN ) injection 2 mg (2 mg Intramuscular Given 05/26/23 0015)  ARIPiprazole  (ABILIFY ) tablet 10 mg (has no administration in time range)  hydrOXYzine  (ATARAX ) tablet 25 mg (25 mg Oral Given 05/25/23 2103)  acetaminophen  (TYLENOL ) tablet 650 mg (650 mg Oral Given 05/25/23 2342)  alum & mag hydroxide-simeth (MAALOX/MYLANTA) 200-200-20 MG/5ML suspension 30 mL (has no administration in time range)  magnesium  hydroxide (MILK OF MAGNESIA) suspension 30 mL (has no administration in time range)  traZODone  (DESYREL ) tablet 50 mg (50 mg Oral Given 05/25/23 2103)  benzocaine  (ORAJEL) 10 % mucosal gel (has no administration in time range)    ED Course/ Medical Decision Making/ A&P                                 Medical Decision Making Amount and/or Complexity of Data Reviewed Radiology: ordered.   Patient here for evaluation of medical clearance after he struck his head on the wall.  He did receive sedating medicines prior to ED arrival.  Patient was awoken from sleep from examination.  He does not have any acute complaints.  He is able to move all extremities symmetrically without difficulty.  No midline cervical spine tenderness.  CT scan is negative for serious closed head injury.  Patient is stable for transfer back to behavioral health for ongoing care.        Final Clinical Impression(s) / ED Diagnoses Final diagnoses:  Contusion of other part of head, initial encounter    Rx / DC Orders ED Discharge Orders     None         Kelsey Patricia, MD 05/26/23 [phone]

## 2023-05-26 NOTE — BHH Suicide Risk Assessment (Signed)
 [facility] Admission Suicide Risk Assessment   Nursing information obtained from:  Patient Demographic factors:  Low socioeconomic status Current Mental Status:  NA Loss Factors:  Loss of significant relationship Historical Factors:  NA Risk Reduction Factors:  Sense of responsibility to family  Total Time spent with patient: 30 minutes Principal Problem: Schizophrenia (HCC) Diagnosis:  Principal Problem:   Schizophrenia (HCC) Active Problems:   Schizoaffective disorder, bipolar type (HCC)  Subjective Data: [name] is a 33 y.o. male who has a past medical history of Schizo affective schizophrenia (HCC) and Schizophrenia (HCC). He presented from an outside hospital for Schizoaffective disorder, bipolar type (HCC) [F25.0] Schizophrenia (HCC) [F20.9] Contusion of other part of head, initial encounter [S00.83XA].   Continued Clinical Symptoms:  Alcohol Use Disorder Identification Test Final Score (AUDIT): 0 The "Alcohol Use Disorders Identification Test", Guidelines for Use in Primary Care, Second Edition.  World Science writer [facility]). Score between 0-7:  no or low risk or alcohol related problems. Score between 8-15:  moderate risk of alcohol related problems. Score between 16-19:  high risk of alcohol related problems. Score 20 or above:  warrants further diagnostic evaluation for alcohol dependence and treatment.   CLINICAL FACTORS:   Alcohol/Substance Abuse/Dependencies Schizophrenia:   Depressive state Less than 42 years old Currently Psychotic Unstable or Poor Therapeutic Relationship   Musculoskeletal: Strength & Muscle Tone: within normal limits Gait & Station: normal Patient leans: N/A  Psychiatric Specialty Exam:  Presentation  General Appearance:  Disheveled  Eye Contact: Poor  Speech: Blocked  Speech Volume: Decreased  Handedness: Right   Mood and Affect  Mood: Dysphoric  Affect: Restricted; Depressed   Thought Process  Thought  Processes: Disorganized  Descriptions of Associations:Tangential  Orientation:Full (Time, Place and Person)  Thought Content:Delusions; Tangential  History of Schizophrenia/Schizoaffective disorder:Yes  Duration of Psychotic Symptoms:Greater than six months  Hallucinations:Hallucinations: Auditory  Ideas of Reference:None  Suicidal Thoughts:Suicidal Thoughts: No  Homicidal Thoughts:Homicidal Thoughts: No   Sensorium  Memory: Immediate Good  Judgment: Poor  Insight: Poor   Executive Functions  Concentration: Fair  Attention Span: Fair  Recall: Good  Fund of Knowledge: Good  Language: Good   Psychomotor Activity  Psychomotor Activity: Psychomotor Activity: Normal   Assets  Assets: Communication Skills; Physical Health   Sleep  Sleep: Sleep: Poor    Physical Exam: Physical Exam ROS Blood pressure 97/68, pulse 63, temperature 97.9 F (36.6 C), temperature source Axillary, resp. rate 18, height 5\' 11"  (1.803 m), weight 67.2 kg, SpO2 100%. Body mass index is 20.66 kg/m.   COGNITIVE FEATURES THAT CONTRIBUTE TO RISK:  Loss of executive function    SUICIDE RISK:   Moderate:  Frequent suicidal ideation with limited intensity, and duration, some specificity in terms of plans, no associated intent, good self-control, limited dysphoria/symptomatology, some risk factors present, and identifiable protective factors, including available and accessible social support.  PLAN OF CARE: See history and physical  I certify that inpatient services furnished can reasonably be expected to improve the patient's condition.   Timmothy Foots, MD 05/26/2023, 1:17 PM

## 2023-05-26 NOTE — Plan of Care (Signed)
  Problem: Education: Goal: Emotional status will improve Outcome: Progressing Goal: Verbalization of understanding the information provided will improve Outcome: Progressing   Problem: Activity: Goal: Interest or engagement in activities will improve Outcome: Progressing   Problem: Coping: Goal: Ability to verbalize frustrations and anger appropriately will improve Outcome: Progressing

## 2023-05-26 NOTE — Progress Notes (Signed)
   05/26/23 2300  Psych Admission Type (Psych Patients Only)  Admission Status Involuntary  Psychosocial Assessment  Patient Complaints None  Eye Contact Brief  Facial Expression Flat  Affect Preoccupied  Speech Pressured  Interaction Assertive  Motor Activity Slow  Appearance/Hygiene Disheveled  Behavior Characteristics Appropriate to situation  Mood Preoccupied  Thought Process  Coherency Circumstantial  Content WDL  Delusions None reported or observed  Perception WDL  Hallucination None reported or observed  Judgment WDL  Confusion None  Danger to Self  Current suicidal ideation? Denies  Agreement Not to Harm Self Yes  Description of Agreement verbal  Danger to Others  Danger to Others None reported or observed

## 2023-05-26 NOTE — Group Note (Signed)
 Recreation Therapy Group Note   Group Topic:Communication  Group Date: 05/26/2023 Start Time: 0933 End Time: 1013 Facilitators: Mychal Durio-McCall, LRT,CTRS Location: [address] Dayroom   Group Topic: Communication, Problem Solving   Goal Area(s) Addresses:  Patient will effectively listen to complete activity.  Patient will identify communication skills used to make activity successful.  Patient will identify how skills used during activity can be used to reach post d/c goals.    Behavioral Response: N/A   Intervention: Building surveyor Activity - Geometric pattern cards, pencils, blank paper    Activity: Geometric Drawings.  Three volunteers from the peer group will be shown an abstract picture with a particular arrangement of geometrical shapes.  Each round, one 'speaker' will describe the pattern, as accurately as possible without revealing the image to the group.  The remaining group members will listen and draw the picture to reflect how it is described to them. Patients with the role of 'listener' cannot ask clarifying questions but, may request that the speaker repeat a direction. Once the drawings are complete, the presenter will show the rest of the group the picture and compare how close each person came to drawing the picture. LRT will facilitate a post-activity discussion regarding effective communication and the importance of planning, listening, and asking for clarification in daily interactions with others.  Education: Environmental consultant, Active listening, Support systems, Discharge planning  Education Outcome: Acknowledges understanding/In group clarification offered/Needs additional education.    Affect/Mood: N/A   Participation Level: Did not attend    Clinical Observations/Individualized Feedback:    Plan: Continue to engage patient in RT group sessions 2-3x/week.   Zenith Kercheval-McCall, LRT,CTRS 05/26/2023 1:12 PM

## 2023-05-26 NOTE — Progress Notes (Signed)
 Reports of patient being disruptive, aggressive and violently banging his head against the wall several times. Per staff, the side of the patient's face is now swollen and tender to touch. Patient also complaining of headache.  Patient given agitation protocol medications.  Recommend transfer to [facility] for medical clearance/r/o concussion. Patient may return to the [facility] when medically stable to continue his psychiatric care. Patient remains under IVC.   I consulted with Dr Alayne Hubert at [facility] and the provider has agreed to accept the patient.  EMTALA completed.

## 2023-05-26 NOTE — Group Note (Signed)
 Date:  05/26/2023 Time:  1:45 PM  Group Topic/Focus:  Goals Group:   The focus of this group is to help patients establish daily goals to achieve during treatment and discuss how the patient can incorporate goal setting into their daily lives to aide in recovery. Orientation:   The focus of this group is to educate the patient on the purpose and policies of crisis stabilization and provide a format to answer questions about their admission.  The group details unit policies and expectations of patients while admitted.    Participation Level:  Did Not Attend    Sheryl Donna 05/26/2023, 1:45 PM

## 2023-05-26 NOTE — Progress Notes (Signed)
   05/26/23 0900  Psych Admission Type (Psych Patients Only)  Admission Status Involuntary  Psychosocial Assessment  Patient Complaints Irritability;Anxiety  Eye Contact Brief  Facial Expression Anxious  Affect Preoccupied;Irritable  Speech Argumentative  Interaction Assertive;Demanding  Motor Activity Slow  Appearance/Hygiene Unremarkable  Behavior Characteristics Irritable;Intrusive  Mood Anxious;Suspicious;Preoccupied  Thought Process  Coherency Circumstantial  Content Blaming others  Delusions None reported or observed  Perception WDL  Hallucination None reported or observed  Judgment WDL  Confusion None  Danger to Self  Current suicidal ideation? Denies  Agreement Not to Harm Self Yes  Description of Agreement verbal  Danger to Others  Danger to Others None reported or observed

## 2023-05-26 NOTE — ED Triage Notes (Signed)
 Pt came from [facility] for hitting head to wall, pt got sedated by [facility] and was brought in for evaluation.

## 2023-05-27 ENCOUNTER — Encounter (HOSPITAL_COMMUNITY): Payer: Self-pay | Admitting: Psychiatry

## 2023-05-27 DIAGNOSIS — F200 Paranoid schizophrenia: Secondary | ICD-10-CM | POA: Diagnosis not present

## 2023-05-27 DIAGNOSIS — E538 Deficiency of other specified B group vitamins: Secondary | ICD-10-CM

## 2023-05-27 HISTORY — DX: Deficiency of other specified B group vitamins: E53.8

## 2023-05-27 LAB — RPR: RPR Ser Ql: NONREACTIVE

## 2023-05-27 LAB — HIV ANTIBODY (ROUTINE TESTING W REFLEX): HIV Screen 4th Generation wRfx: NONREACTIVE

## 2023-05-27 MED ORDER — CLOZAPINE 25 MG PO TABS: 450.0000 mg | ORAL_TABLET | Freq: Every day | ORAL | Status: DC

## 2023-05-27 MED ORDER — CLOZAPINE 25 MG PO TABS: 150.0000 mg | ORAL_TABLET | Freq: Every day | ORAL | Status: DC

## 2023-05-27 MED ORDER — CLOZAPINE 25 MG PO TABS
50.0000 mg | ORAL_TABLET | Freq: Every day | ORAL | Status: DC
  Filled 2023-05-27: qty 2

## 2023-05-27 MED ORDER — LORAZEPAM 1 MG PO TABS
1.0000 mg | ORAL_TABLET | Freq: Once | ORAL | Status: AC
  Administered 2023-05-27: 1 mg via ORAL
  Filled 2023-05-27: qty 1

## 2023-05-27 MED ORDER — MORPHINE SULFATE (PF) 4 MG/ML IV SOLN
4.0000 mg | Freq: Once | INTRAVENOUS | Status: AC
  Administered 2023-05-27: 4 mg via INTRAMUSCULAR
  Filled 2023-05-27: qty 1

## 2023-05-27 MED ORDER — CLOZAPINE 25 MG PO TABS
150.0000 mg | ORAL_TABLET | Freq: Every day | ORAL | Status: DC
  Filled 2023-05-27: qty 2

## 2023-05-27 MED ORDER — CLOZAPINE 100 MG PO TABS
100.0000 mg | ORAL_TABLET | Freq: Every day | ORAL | Status: AC
  Administered 2023-05-28: 100 mg via ORAL
  Filled 2023-05-27: qty 1

## 2023-05-27 MED ORDER — ACETAMINOPHEN 500 MG PO TABS
1000.0000 mg | ORAL_TABLET | Freq: Four times a day (QID) | ORAL | Status: DC | PRN
  Administered 2023-05-27 – 2023-06-03 (×7): 1000 mg via ORAL
  Filled 2023-05-27 (×7): qty 2

## 2023-05-27 MED ORDER — CLOZAPINE 100 MG PO TABS: 200.0000 mg | ORAL_TABLET | Freq: Every day | ORAL | Status: DC

## 2023-05-27 MED ORDER — LORAZEPAM 1 MG PO TABS: 2.0000 mg | ORAL_TABLET | Freq: Once | ORAL | Status: DC

## 2023-05-27 MED ORDER — LITHIUM CARBONATE ER 300 MG PO TBCR
600.0000 mg | EXTENDED_RELEASE_TABLET | Freq: Two times a day (BID) | ORAL | Status: DC
  Administered 2023-05-27 (×2): 600 mg via ORAL
  Filled 2023-05-27 (×9): qty 2

## 2023-05-27 MED ORDER — VITAMIN B-12 1000 MCG PO TABS
1000.0000 ug | ORAL_TABLET | Freq: Every day | ORAL | Status: DC
  Administered 2023-05-27 – 2023-06-05 (×8): 1000 ug via ORAL
  Filled 2023-05-27 (×11): qty 1

## 2023-05-27 MED ORDER — HALOPERIDOL 5 MG PO TABS
5.0000 mg | ORAL_TABLET | Freq: Four times a day (QID) | ORAL | Status: DC | PRN
  Administered 2023-05-30: 5 mg via ORAL
  Filled 2023-05-27: qty 1

## 2023-05-27 MED ORDER — DIPHENHYDRAMINE HCL 25 MG PO CAPS
50.0000 mg | ORAL_CAPSULE | Freq: Four times a day (QID) | ORAL | Status: DC | PRN
  Administered 2023-05-30: 50 mg via ORAL
  Filled 2023-05-27: qty 2

## 2023-05-27 MED ORDER — CLOZAPINE 25 MG PO TABS: 350.0000 mg | ORAL_TABLET | Freq: Every day | ORAL | Status: DC

## 2023-05-27 MED ORDER — ARIPIPRAZOLE 15 MG PO TABS
15.0000 mg | ORAL_TABLET | Freq: Every day | ORAL | Status: DC
  Filled 2023-05-27 (×3): qty 1

## 2023-05-27 MED ORDER — CLOZAPINE 25 MG PO TABS: 250.0000 mg | ORAL_TABLET | Freq: Every day | ORAL | Status: DC

## 2023-05-27 MED ORDER — DIVALPROEX SODIUM 500 MG PO DR TAB
500.0000 mg | DELAYED_RELEASE_TABLET | Freq: Two times a day (BID) | ORAL | Status: DC
  Administered 2023-05-27: 500 mg via ORAL
  Filled 2023-05-27 (×4): qty 1

## 2023-05-27 MED ORDER — CLOZAPINE 100 MG PO TABS: 100.0000 mg | ORAL_TABLET | Freq: Every day | ORAL | Status: DC

## 2023-05-27 MED ORDER — LORAZEPAM 1 MG PO TABS
2.0000 mg | ORAL_TABLET | Freq: Four times a day (QID) | ORAL | Status: DC | PRN
  Administered 2023-05-30: 2 mg via ORAL
  Filled 2023-05-27: qty 2

## 2023-05-27 MED ORDER — SENNA 8.6 MG PO TABS
2.0000 | ORAL_TABLET | Freq: Every day | ORAL | Status: DC
  Administered 2023-05-27 – 2023-06-04 (×9): 17.2 mg via ORAL
  Filled 2023-05-27 (×11): qty 2

## 2023-05-27 MED ORDER — OLANZAPINE 10 MG PO TBDP
10.0000 mg | ORAL_TABLET | Freq: Two times a day (BID) | ORAL | Status: DC
  Administered 2023-05-27: 10 mg via ORAL
  Filled 2023-05-27 (×4): qty 1

## 2023-05-27 MED ORDER — GABAPENTIN 100 MG PO CAPS
100.0000 mg | ORAL_CAPSULE | Freq: Once | ORAL | Status: AC
  Administered 2023-05-27: 100 mg via ORAL
  Filled 2023-05-27 (×2): qty 1

## 2023-05-27 MED ORDER — CLOZAPINE 100 MG PO TABS: 300.0000 mg | ORAL_TABLET | Freq: Every day | ORAL | Status: DC

## 2023-05-27 MED ORDER — CLOZAPINE 100 MG PO TABS: 400.0000 mg | ORAL_TABLET | Freq: Every day | ORAL | Status: DC

## 2023-05-27 MED ORDER — CLOZAPINE 25 MG PO TABS
50.0000 mg | ORAL_TABLET | Freq: Every day | ORAL | Status: DC
  Filled 2023-05-27 (×2): qty 2

## 2023-05-27 MED ORDER — FOLIC ACID 1 MG PO TABS
1.0000 mg | ORAL_TABLET | Freq: Every day | ORAL | Status: DC
  Administered 2023-05-27 – 2023-06-05 (×8): 1 mg via ORAL
  Filled 2023-05-27 (×11): qty 1

## 2023-05-27 MED ORDER — CLOZAPINE 25 MG PO TABS
50.0000 mg | ORAL_TABLET | Freq: Every day | ORAL | Status: AC
  Administered 2023-05-27: 50 mg via ORAL
  Filled 2023-05-27 (×2): qty 2

## 2023-05-27 NOTE — Progress Notes (Signed)
Patient transported to ED

## 2023-05-27 NOTE — Progress Notes (Signed)
 Social worker attempted to complete psychosocial assessment with patient, however, patient declined. Social worker will attempt to complete assessment at a later time.   Dhanvi Boesen, LCSWA

## 2023-05-27 NOTE — Progress Notes (Signed)
 [facility] MD Progress Note  05/27/2023 1:17 PM [name]  MRN:  [id number] Principal Problem: Schizophrenia (HCC) Diagnosis: Principal Problem:   Schizophrenia (HCC) Active Problems:   Schizoaffective disorder, bipolar type (HCC)   ID & Admission Information: [name] is an 33 y.o. male who  has a past medical history of B12 deficiency (05/27/2023), Folate deficiency (05/27/2023), Schizo affective schizophrenia (HCC), and Schizophrenia (HCC).  He presented on 05/25/2023  6:24 PM for Schizophrenia (HCC).  The patient presented in a decompensated state in the setting of medication noncompliance.  He had been in [facility] for 3 years, where he done well on a combination of clozapine , lithium , and Abilify .  Since being discharged from [facility] he has been admitted to [facility] in [place] multiple times.  Clozapine  was not restarted either hospital, and his mother reports he has been doing poorly.  Chief Complaint: "I am ready to go!"  Subjective:   Case was discussed in the multidisciplinary team. MAR was reviewed and patient was compliant with medications.  He required agitation protocol overnight.  He has had to be sent to the ED for head banging.  PRN's in last 24 hours: Tylenol  650 mg administered at 0859, and at 0018 Orajel administered at 1719, 2131, and 1135 Haldol  10 mg, Benadryl  50 mg, and Ativan  2 mg, IM and Atarax  50 mg oral administered at 0044  Psychiatric Team made the following recommendations yesterday: Started Abilify  15 mg  Today on interview, pt reports that he wants to leave the hospital.  He is a poor historian and is not able to meaningfully engage in the interview due to decompensated schizophrenia.  I discussed his lab results with him and starting medications for vitamin deficiencies.  He stated that he understood, but I am not sure.  Later he approached me in the hallway and requested that I "rip his tooth out".  He has been complaining of  dental pain, for which Tylenol  and Orajel have been prescribed.  Nursing reportedly slept poorly despite getting the agitation protocol.  He does not respond appropriately to questions regarding hallucinations, or mood related questions.  Collateral was obtained from the patient's mother Clydia Dart.  She tells me that the patient was in [facility] for approximately 3 years.  She states that he was doing well on a combination of clozapine  450 mg nightly, lithium  1200 mg nightly, and Abilify  Maintena, dose unknown, presumably 400 mg.  The patient has been off of Clozaril  for more than a month, so we will plan on restarting the titration at 50 mg daily.  Will restart lithium  at 600 mg twice daily.  Mom states that he was taking metoprolol  25 mg 1.5 tabs twice daily, presumably for clozapine  induced tachycardia.  Pulse was 71 this morning, but will monitor as clozapine  is restarted.  We will also check standard clozapine  labs.    Past Psychiatric and Medical Medical History:  Past Medical History:  Diagnosis Date   B12 deficiency 05/27/2023   Folate deficiency 05/27/2023   Schizo affective schizophrenia (HCC)    Schizophrenia (HCC)     History reviewed. No pertinent surgical history.  Family History(Medical and Psychiatric): Unknown  Social History:  Social History   Substance and Sexual Activity  Alcohol Use Not Currently     Social History   Substance and Sexual Activity  Drug Use Yes   Types: Cocaine, Marijuana   Comment: crack 06-26-19    Social History   Socioeconomic History  Marital status: Single    Spouse name: Not on file   Number of children: Not on file   Years of education: Not on file   Highest education level: Not on file  Occupational History   Not on file  Tobacco Use   Smoking status: Every Day   Smokeless tobacco: Never  Substance and Sexual Activity   Alcohol use: Not Currently   Drug use: Yes    Types: Cocaine, Marijuana    Comment: crack 06-26-19    Sexual activity: Not on file  Other Topics Concern   Not on file  Social History Narrative   Not on file   Social Drivers of Health   Financial Resource Strain: Not on file  Food Insecurity: No Food Insecurity (05/25/2023)   Hunger Vital Sign    Worried About Running Out of Food in the Last Year: Never true    Ran Out of Food in the Last Year: Never true  Transportation Needs: No Transportation Needs (05/25/2023)   PRAPARE - Administrator, Civil Service (Medical): No    Lack of Transportation (Non-Medical): No  Physical Activity: Not on file  Stress: Not on file  Social Connections: Unknown (05/22/2021)   [facility]   Social Network    Social Network: Not on file        Current Medications: Current Facility-Administered Medications  Medication Dose Route Frequency Provider Last Rate Last Admin   acetaminophen  (TYLENOL ) tablet 1,000 mg  1,000 mg Oral Q6H PRN Lamya Lausch S, MD   1,000 mg at 05/27/23 1316   alum & mag hydroxide-simeth (MAALOX/MYLANTA) 200-200-20 MG/5ML suspension 30 mL  30 mL Oral Q4H PRN Gilman Lade, NP       [START ON 05/28/2023] ARIPiprazole  (ABILIFY ) tablet 15 mg  15 mg Oral Daily Jaylenn Altier S, MD       benzocaine  (ORAJEL) 10 % mucosal gel   Mouth/Throat QID PRN Dorthea Gauze, NP   Given at 05/27/23 1135   cloZAPine  (CLOZARIL ) tablet 50 mg  50 mg Oral QHS Timmothy Foots, MD       And   [START ON 05/28/2023] cloZAPine  (CLOZARIL ) tablet 100 mg  100 mg Oral QHS Timmothy Foots, MD       And   [START ON 05/29/2023] cloZAPine  (CLOZARIL ) tablet 150 mg  150 mg Oral QHS Timmothy Foots, MD       And   [START ON 05/30/2023] cloZAPine  (CLOZARIL ) tablet 200 mg  200 mg Oral QHS Timmothy Foots, MD       And   [START ON 05/31/2023] cloZAPine  (CLOZARIL ) tablet 250 mg  250 mg Oral QHS Timmothy Foots, MD       And   [START ON 06/01/2023] cloZAPine  (CLOZARIL ) tablet 300 mg  300 mg Oral QHS Timmothy Foots, MD        And   [START ON 06/02/2023] cloZAPine  (CLOZARIL ) tablet 350 mg  350 mg Oral QHS Timmothy Foots, MD       And   [START ON 06/03/2023] cloZAPine  (CLOZARIL ) tablet 400 mg  400 mg Oral QHS Timmothy Foots, MD       And   [START ON 06/04/2023] cloZAPine  (CLOZARIL ) tablet 450 mg  450 mg Oral QHS Lisanne Ponce S, MD       cyanocobalamin (VITAMIN B12) tablet 1,000 mcg  1,000 mcg Oral Daily Refael Fulop S, MD   1,000 mcg at 05/27/23 1004  haloperidol  (HALDOL ) tablet 5 mg  5 mg Oral Q6H PRN Timmothy Foots, MD       And   LORazepam  (ATIVAN ) tablet 2 mg  2 mg Oral Q6H PRN Timmothy Foots, MD       And   diphenhydrAMINE  (BENADRYL ) capsule 50 mg  50 mg Oral Q6H PRN Timmothy Foots, MD       haloperidol  lactate (HALDOL ) injection 5 mg  5 mg Intramuscular TID PRN Gilman Lade, NP       And   diphenhydrAMINE  (BENADRYL ) injection 50 mg  50 mg Intramuscular TID PRN Gilman Lade, NP       And   LORazepam  (ATIVAN ) injection 2 mg  2 mg Intramuscular TID PRN Gilman Lade, NP       haloperidol  lactate (HALDOL ) injection 10 mg  10 mg Intramuscular TID PRN Gilman Lade, NP   10 mg at 05/27/23 0044   And   diphenhydrAMINE  (BENADRYL ) injection 50 mg  50 mg Intramuscular TID PRN Gilman Lade, NP   50 mg at 05/27/23 0044   And   LORazepam  (ATIVAN ) injection 2 mg  2 mg Intramuscular TID PRN Gilman Lade, NP   2 mg at 05/27/23 0043   folic acid (FOLVITE) tablet 1 mg  1 mg Oral Daily Estreya Clay S, MD   1 mg at 05/27/23 1004   hydrOXYzine  (ATARAX ) tablet 50 mg  50 mg Oral TID PRN Diallo Ponder S, MD   50 mg at 05/27/23 0018   lithium  carbonate (LITHOBID) ER tablet 600 mg  600 mg Oral Q12H Alianna Wurster S, MD   600 mg at 05/27/23 1316   magnesium  hydroxide (MILK OF MAGNESIA) suspension 30 mL  30 mL Oral Daily PRN Gilman Lade, NP       senna (SENOKOT) tablet 17.2 mg  2 tablet Oral QHS Timmothy Foots, MD       traZODone  (DESYREL ) tablet 50 mg  50 mg Oral QHS  Rockney Grenz S, MD   50 mg at 05/26/23 2116    Lab Results:  Results for orders placed or performed during the hospital encounter of 05/25/23 (from the past 48 hours)  Folate     Status: Abnormal   Collection Time: 05/26/23  6:35 PM  Result Value Ref Range   Folate 5.7 (L) >5.9 ng/mL    Comment: Performed at [facility], [address], [place], [place] [address]  RPR     Status: None   Collection Time: 05/26/23  6:35 PM  Result Value Ref Range   RPR Ser Ql NON REACTIVE NON REACTIVE    Comment: Performed at [facility], [address] [place], [place] [address]  TSH     Status: None   Collection Time: 05/26/23  6:35 PM  Result Value Ref Range   TSH 1.178 0.350 - 4.500 uIU/mL    Comment: Performed by a 3rd Generation assay with a functional sensitivity of <=0.01 uIU/mL. Performed at [facility], [address] [place], [place] [address]   Vitamin B12     Status: Abnormal   Collection Time: 05/26/23  6:35 PM  Result Value Ref Range   Vitamin B-12 177 (L) 180 - 914 pg/mL    Comment: (NOTE) This assay is not validated for testing neonatal or myeloproliferative syndrome specimens for Vitamin B12 levels. Performed at [facility], [address] [place], [place] [address]  VITAMIN D 25 Hydroxy (Vit-D Deficiency, Fractures)     Status: None   Collection Time: 05/26/23  6:35 PM  Result Value Ref Range   Vit D, 25-Hydroxy 39.67 30 - 100 ng/mL    Comment: (NOTE) Vitamin D deficiency has been defined by the Institute of Medicine  and an Endocrine Society practice guideline as a level of serum 25-OH  vitamin D less than 20 ng/mL (1,2). The Endocrine Society went on to  further define vitamin D insufficiency as a level between 21 and 29  ng/mL (2).  1. IOM (Institute of Medicine). 2010. Dietary reference intakes for  calcium and D. [place]: [employer]. 2. Holick MF, Binkley Edgeley,  Bischoff-Ferrari HA, et al. Evaluation,  treatment, and prevention of vitamin D deficiency: an Endocrine  Society clinical practice guideline, JCEM. 2011 Jul; 96(7): 1911-30.  Performed at [facility], [address], [place], [place] [address]   HIV Antibody (routine testing w rflx)     Status: None   Collection Time: 05/26/23  6:35 PM  Result Value Ref Range   HIV Screen 4th Generation wRfx Non Reactive Non Reactive    Comment: Performed at [facility], [address], [place], [place] [address]    Blood Alcohol level:  Lab Results  Component Value Date   [facility] <15 05/24/2023   ETH <15 05/10/2023    Metabolic Disorder Labs: No results found for: "HGBA1C", "MPG" No results found for: "PROLACTIN" Lab Results  Component Value Date   CHOL 121 11/30/2018   TRIG 102 11/30/2018   HDL 39 (L) 11/30/2018   CHOLHDL 3.1 11/30/2018   VLDL 20 11/30/2018   LDLCALC 62 11/30/2018    Physical Findings: AIMS:  , ,  ,  ,    CIWA:    COWS:     Psychiatric Specialty Exam:  Presentation  General Appearance: Disheveled; Bizarre  Eye Contact: Poor  Speech: Slow; Blocked  Speech Volume: Decreased  Handedness: Right   Mood and Affect  Mood: Anxious; Dysphoric  Affect: Blunt; Flat   Thought Process  Thought Processes: Disorganized  Descriptions of Associations: Tangential  Orientation: Partial  Thought Content: Illogical; Scattered  History of Schizophrenia/Schizoaffective disorder: Yes  Duration of Psychotic Symptoms: NA Hallucinations: Hallucinations: Auditory  Ideas of Reference: None  Suicidal Thoughts: Suicidal Thoughts: No  Homicidal Thoughts: Homicidal Thoughts: No   Sensorium  Memory: Immediate Poor  Judgment: Poor  Insight: Poor   Executive Functions  Concentration: Poor  Attention Span: Poor  Recall: Fair  Fund of Knowledge: Fair  Language: Good   Psychomotor Activity  Psychomotor Activity: Psychomotor Activity:  Normal   Assets  Assets: Communication Skills; Social Support; Housing   Sleep  Sleep: Sleep: Fair   Musculoskeletal: Strength & Muscle Tone: within normal limits Gait & Station: normal Patient leans: N/A   Physical Exam: General: Sitting comfortably. NAD. HEENT: Normocephalic, atraumatic, MMM, EMOI Lungs: no increased work of breathing noted Heart: no cyanosis Abdomen: Non distended Musculoskeletal: FROM. No obvious deformities Skin: Warm, dry, intact. No rashes noted Neuro: No obvious focal deficits.  Gait and station are normal  Review of Systems  Constitutional: Negative.   HENT: Negative.    Eyes: Negative.   Respiratory: Negative.    Cardiovascular: Negative.   Gastrointestinal: Negative.   Genitourinary: Negative.   Skin: Negative.   Neurological: Negative.   Psychiatric/Behavioral: Per mental status exam   Blood pressure 100/66, pulse 71, temperature 97.9 F (36.6 C), temperature source  Axillary, resp. rate 18, height 5\' 11"  (1.803 m), weight 67.2 kg, SpO2 100%. Body mass index is 20.66 kg/m.  ASSESSMENT: [name] is an 33 y.o. male who  has a past medical history of B12 deficiency (05/27/2023), Folate deficiency (05/27/2023), Schizo affective schizophrenia (HCC), and Schizophrenia (HCC).  He presented on 05/25/2023  6:24 PM for Schizophrenia (HCC).  The patient presented in a decompensated state in the setting of medication noncompliance.  He had been in [facility] for [facility] years, where he done well on a combination of clozapine , lithium , and Abilify .  Since being discharged from [facility] he has been admitted to [facility] multiple times.  Clozapine  was not restarted either hospital, and his mother reports he has been doing poorly.  Will plan on titrating Clozaril  back to 450 mg nightly, restarting lithium , and getting the patient back on Abilify  Maintena injection.   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/06/2018   Agitation    Cannabis abuse 11/10/2017     Scheduled Medications:  [START ON 05/28/2023] ARIPiprazole   15 mg Oral Daily   cloZAPine   50 mg Oral QHS   And   [START ON 05/28/2023] cloZAPine   100 mg Oral QHS   And   [START ON 05/29/2023] cloZAPine   150 mg Oral QHS   And   [START ON 05/30/2023] cloZAPine   200 mg Oral QHS   And   [START ON 05/31/2023] cloZAPine   250 mg Oral QHS   And   [START ON  06/01/2023] cloZAPine   300 mg Oral QHS   And   [START ON 06/02/2023] cloZAPine   350 mg Oral QHS   And   [START ON 06/03/2023] cloZAPine   400 mg Oral QHS   And   [START ON 06/04/2023] cloZAPine   450 mg Oral QHS   vitamin B-12  1,000 mcg Oral Daily   folic acid  1 mg Oral Daily   lithium  carbonate  600 mg Oral Q12H   senna  2 tablet Oral QHS   traZODone   50 mg Oral  QHS    As Needed Medications: acetaminophen , alum & mag hydroxide-simeth, benzocaine , haloperidol  **AND** LORazepam  **AND** diphenhydrAMINE , haloperidol  lactate **AND** diphenhydrAMINE  **AND** LORazepam , haloperidol  lactate **AND** diphenhydrAMINE  **AND** LORazepam , hydrOXYzine , magnesium  hydroxide    3. Medical Issues Being Addressed:   -- B12 deficiency, folate deficiency, tooth pain  Labs reviewed, unremarkable with the exception of: Folate 5.7, B12 177, UDS positive for benzos and cocaine     4. Discharge Planning:   -- Social work and case management to assist with discharge planning and identification of hospital follow-up needs prior to discharge  -- Estimated LOS: 10 to 14 days  -- Discharge Concerns: Need to establish a safety plan; Medication compliance and effectiveness  -- Discharge Goals: Return home with outpatient referrals for mental health follow-up including medication management/psychotherapy  5. Short Term Goals:  Improve ability to identify changes in lifestyle to reduce recurrence of condition, verbalize feelings, disclose and discuss suicidal ideas, demonstrate self-control, identify and develop effective coping behaviors, compliance with prescribed medications, identify triggers associated with substance abuse/mental health issues, participate in unit milieu and in scheduled group therapies   6. Long Term Goals: Improvement in symptoms so the patient is ready for discharge   --The risks/benefits/side-effects/alternatives to the medications above were discussed in detail with the patient and time was  given for questions. The patient provided informed consent.   -- Metabolic profile and EKG monitoring obtained while on an atypical antipsychotic and listed in the EHR    Total Time Spent in Direct Patient Care:  I personally spent 35 minutes on the unit in direct patient care. The direct patient care time included face-to-face time with the patient, reviewing the patient's chart, communicating with other professionals, and coordinating care. Greater than 50% of this time was spent in counseling or coordinating care with the patient regarding goals of hospitalization, psycho-education, and discharge planning needs.      Clair Crews, MD Psychiatrist  05/27/2023, 1:17 PM   I certify that inpatient services furnished can reasonably be expected to improve the patient's condition.    Portions of this note were created using voice recognition software. Minor syntax errors, grammatical content, spelling, or punctuation errors may have occurred unintentionally. Please notify the [facility] if the meaning of any statement is unclear.

## 2023-05-27 NOTE — Progress Notes (Signed)
 Patient pacing, yelling at staff stating ' just give me the shot I wouldn't stop.' Patient appears agitated, stating ' I want to go to the hospital like last night.' Patient verbally redirected and offered and administered PRN hydroxyzine  50mg  PO at 1218. Patient then came back to nursing station and began yelling and being disruptive. Patient given PRN agitation protocol per [facility] at 1244

## 2023-05-27 NOTE — ED Provider Notes (Signed)
 [place] EMERGENCY DEPARTMENT AT [facility] Provider Note   CSN: [id number] Arrival date & time: 05/26/23  0136     History Schizoaffective disorder, bipolar type, schizophrenia, substance abuse Tooth pain [name] is a 33 y.o. male.  Per pt he is coming in for tooth pain/nerve pain. When asked if he can tell me more about it, he says no and closes his eyes to sleep. The employee from behavior health who is accompanying him states the patient complained of R sided tooth pain shooting up the side of his face. This has not impacted his ability to eat.  Of note, he was seen yesterday for AMS and aggressive behavior after he was banging his head against the wall the behavioral health center.  Head imaging and exam non concerning and he was transferred back to the behavioral health center.  The behavioral health in play with him today states he did receive some of his antipsychotic medications and Tylenol  prior to coming to the ED again.   Altered Mental Status      Home Medications Prior to Admission medications   Medication Sig Start Date End Date Taking? Authorizing Provider  penicillin  v potassium (VEETID) 500 MG tablet Take 1 tablet (500 mg total) by mouth 4 (four) times daily. Patient not taking: Reported on 05/24/2023 05/11/23   Ward, Clover Dao, DO      Allergies    Ibuprofen, Risperidone and related, Ziprasidone , Benztropine , and Quetiapine     Review of Systems   Review of Systems As in HPI  Physical Exam Updated Vital Signs BP 100/66   Pulse 71   Temp 97.9 F (36.6 C) (Axillary)   Resp 18   Ht 5\' 11"  (1.803 m)   Wt 67.2 kg   SpO2 100%   BMI 20.66 kg/m  Physical Exam Constitutional:      General: He is not in acute distress.    Appearance: He is not toxic-appearing.  HENT:     Head: Normocephalic and atraumatic.     Mouth/Throat:     Mouth: Mucous membranes are moist.     Dentition: Abnormal dentition. Gingival swelling present. No dental  abscesses.     Pharynx: No oropharyngeal exudate.     Comments: Exquisite pain with pressure applied to top back most molar on the R Eyes:     Conjunctiva/sclera: Conjunctivae normal.  Cardiovascular:     Rate and Rhythm: Normal rate and regular rhythm.     Heart sounds: No murmur heard. Pulmonary:     Effort: Pulmonary effort is normal. No respiratory distress.     Breath sounds: Normal breath sounds.  Neurological:     General: No focal deficit present.     Comments: Somnolent but wakes to questioning and answers questions appropriately     ED Results / Procedures / Treatments   Labs (all labs ordered are listed, but only abnormal results are displayed) Labs Reviewed  FOLATE - Abnormal; Notable for the following components:      Result Value   Folate 5.7 (*)    All other components within normal limits  VITAMIN B12 - Abnormal; Notable for the following components:   Vitamin B-12 177 (*)    All other components within normal limits  RPR  TSH  VITAMIN D  25 HYDROXY (VIT D DEFICIENCY, FRACTURES)  HIV ANTIBODY (ROUTINE TESTING W REFLEX)  HEMOGLOBIN A1C  LIPID PANEL  CBC WITH DIFFERENTIAL/PLATELET  HIGH SENSITIVITY CRP  TROPONIN I (HIGH SENSITIVITY)  EKG None  Radiology CT Head Wo Contrast Result Date: 05/26/2023 CLINICAL DATA:  Head trauma, abnormal mental status (Age 10-64y) EXAM: CT HEAD WITHOUT CONTRAST TECHNIQUE: Contiguous axial images were obtained from the base of the skull through the vertex without intravenous contrast. RADIATION DOSE REDUCTION: This exam was performed according to the departmental dose-optimization program which includes automated exposure control, adjustment of the mA and/or kV according to patient size and/or use of iterative reconstruction technique. COMPARISON:  CT head 05/24/2023 FINDINGS: Brain: No evidence of large-territorial acute infarction. No parenchymal hemorrhage. No mass lesion. No extra-axial collection. No mass effect or midline  shift. No hydrocephalus. Basilar cisterns are patent. Vascular: No hyperdense vessel. Skull: No acute fracture or focal lesion. Sinuses/Orbits: Paranasal sinuses and mastoid air cells are clear. The orbits are unremarkable. Other: None. IMPRESSION: No acute intracranial abnormality. Electronically Signed   By: Morgane  Naveau M.D.   On: 05/26/2023 02:42    Procedures Procedures  None  Medications Ordered in ED Medications  haloperidol  lactate (HALDOL ) injection 5 mg (has no administration in time range)    And  diphenhydrAMINE  (BENADRYL ) injection 50 mg (has no administration in time range)    And  LORazepam  (ATIVAN ) injection 2 mg (has no administration in time range)  haloperidol  lactate (HALDOL ) injection 10 mg (10 mg Intramuscular Given 05/27/23 0044)    And  diphenhydrAMINE  (BENADRYL ) injection 50 mg (50 mg Intramuscular Given 05/27/23 0044)    And  LORazepam  (ATIVAN ) injection 2 mg (2 mg Intramuscular Given 05/27/23 0043)  alum & mag hydroxide-simeth (MAALOX/MYLANTA) 200-200-20 MG/5ML suspension 30 mL (has no administration in time range)  magnesium  hydroxide (MILK OF MAGNESIA) suspension 30 mL (has no administration in time range)  benzocaine  (ORAJEL) 10 % mucosal gel ( Mouth/Throat Given 05/27/23 1135)  traZODone  (DESYREL ) tablet 50 mg (50 mg Oral Given 05/26/23 2116)  hydrOXYzine  (ATARAX ) tablet 50 mg (50 mg Oral Given 05/27/23 0018)  cyanocobalamin  (VITAMIN B12) tablet 1,000 mcg (1,000 mcg Oral Given 05/27/23 1004)  folic acid  (FOLVITE ) tablet 1 mg (1 mg Oral Given 05/27/23 1004)  haloperidol  (HALDOL ) tablet 5 mg (has no administration in time range)    And  LORazepam  (ATIVAN ) tablet 2 mg (has no administration in time range)    And  diphenhydrAMINE  (BENADRYL ) capsule 50 mg (has no administration in time range)  acetaminophen  (TYLENOL ) tablet 1,000 mg (1,000 mg Oral Given 05/27/23 1316)  lithium  carbonate (LITHOBID ) ER tablet 600 mg (600 mg Oral Given 05/27/23 1316)  ARIPiprazole   (ABILIFY ) tablet 15 mg (has no administration in time range)  senna (SENOKOT) tablet 17.2 mg (has no administration in time range)  cloZAPine  (CLOZARIL ) tablet 50 mg (has no administration in time range)    And  cloZAPine  (CLOZARIL ) tablet 100 mg (has no administration in time range)    And  cloZAPine  (CLOZARIL ) tablet 150 mg (has no administration in time range)    And  cloZAPine  (CLOZARIL ) tablet 200 mg (has no administration in time range)    And  cloZAPine  (CLOZARIL ) tablet 250 mg (has no administration in time range)    And  cloZAPine  (CLOZARIL ) tablet 300 mg (has no administration in time range)    And  cloZAPine  (CLOZARIL ) tablet 350 mg (has no administration in time range)    And  cloZAPine  (CLOZARIL ) tablet 400 mg (has no administration in time range)    And  cloZAPine  (CLOZARIL ) tablet 450 mg (has no administration in time range)  gabapentin  (NEURONTIN ) capsule 100 mg (100 mg Oral Given  05/27/23 0202)  LORazepam  (ATIVAN ) tablet 1 mg (1 mg Oral Given 05/27/23 0204)    ED Course/ Medical Decision Making/ A&P   {                                Medical Decision Making 33 year old male with PMH significant for schizophrenia and substance abuse comes from behavioral health hospital due to 3 days of dental pain.  Reassuringly, he has had no fevers and is able to eat without issue. Vitals are stable, no abscess on exam but does have exquisite tenderness to pressure of right top back molar.  Has poor dentition in general with mildly swollen and erythematous gums.  Given that her abscess or systemic symptoms, no indication for antibiotics at this time. He remained stable did not require admission. His pain was treated with IM morphine  and he was discharged back to the behavioral health hospital with instructions to follow-up ASAP with a dentist.  Amount and/or Complexity of Data Reviewed Radiology: ordered.  Risk Prescription drug management.    Final Clinical Impression(s) /  ED Diagnoses Final diagnoses:  Contusion of other part of head, initial encounter    Rx / DC Orders ED Discharge Orders     None         Glenn Lange, DO 05/27/23 1520    Dorenda Gandy, MD 05/29/23 1729

## 2023-05-27 NOTE — Plan of Care (Signed)
   Problem: Education: Goal: Knowledge of [facility] Education information/materials will improve Outcome: Progressing Goal: Emotional status will improve Outcome: Progressing Goal: Mental status will improve Outcome: Progressing Goal: Verbalization of understanding the information provided will improve Outcome: Progressing

## 2023-05-27 NOTE — ED Triage Notes (Addendum)
 BIBA and PD from [facility] under IVC for aggressive behavior. Pt was apparently trying to slam his head into a wall. Pt also c/o right side tooth pain. 1000 mg tylenol , lithium  was given PTA. 130/90 BP 72 HR 99% room air

## 2023-05-27 NOTE — Progress Notes (Signed)
   05/27/23 0923  Psych Admission Type (Psych Patients Only)  Admission Status Involuntary  Psychosocial Assessment  Patient Complaints None  Eye Contact Brief  Facial Expression Flat  Affect Irritable  Speech Soft  Interaction Minimal  Motor Activity Slow  Appearance/Hygiene Disheveled  Behavior Characteristics Cooperative  Mood Irritable  Thought Process  Coherency Circumstantial  Content Blaming others  Delusions None reported or observed  Perception WDL  Hallucination None reported or observed  Judgment Impaired  Confusion Mild  Danger to Self  Current suicidal ideation? Denies  Agreement Not to Harm Self Yes  Description of Agreement Verbal  Danger to Others  Danger to Others None reported or observed

## 2023-05-27 NOTE — Progress Notes (Signed)
 Pt to be transported to ED for medical evaluation of tooth pain. Non emergent line called and pt awaiting transport. Pt remains safe on the unit at this time.

## 2023-05-27 NOTE — Discharge Instructions (Addendum)
 You were treated with an injection of morphine for your pain.  You can continue to take Tylenol  as needed until you are able to get in with the dentist.  I recommend scheduling appointment with the dentist ASAP.  If you develop swelling around the tooth, drainage from the tooth or fever these are reasons to return to the ED or PCP prior to dental appointment.

## 2023-05-28 DIAGNOSIS — F200 Paranoid schizophrenia: Secondary | ICD-10-CM | POA: Diagnosis not present

## 2023-05-28 LAB — DIFFERENTIAL
Abs Immature Granulocytes: 0.02 10*3/uL (ref 0.00–0.07)
Basophils Absolute: 0 10*3/uL (ref 0.0–0.1)
Basophils Relative: 0 %
Eosinophils Absolute: 0 10*3/uL (ref 0.0–0.5)
Eosinophils Relative: 1 %
Immature Granulocytes: 0 %
Lymphocytes Relative: 29 %
Lymphs Abs: 2.2 10*3/uL (ref 0.7–4.0)
Monocytes Absolute: 0.5 10*3/uL (ref 0.1–1.0)
Monocytes Relative: 7 %
Neutro Abs: 4.6 10*3/uL (ref 1.7–7.7)
Neutrophils Relative %: 63 %

## 2023-05-28 LAB — CBC
HCT: 42.3 % (ref 39.0–52.0)
Hemoglobin: 13.4 g/dL (ref 13.0–17.0)
MCH: 29.1 pg (ref 26.0–34.0)
MCHC: 31.7 g/dL (ref 30.0–36.0)
MCV: 91.8 fL (ref 80.0–100.0)
Platelets: 216 10*3/uL (ref 150–400)
RBC: 4.61 MIL/uL (ref 4.22–5.81)
RDW: 13.2 % (ref 11.5–15.5)
WBC: 7.3 10*3/uL (ref 4.0–10.5)
nRBC: 0 % (ref 0.0–0.2)

## 2023-05-28 LAB — TROPONIN I (HIGH SENSITIVITY): Troponin I (High Sensitivity): <2 ng/L (ref <18)

## 2023-05-28 MED ORDER — LITHIUM CARBONATE ER 300 MG PO TBCR
600.0000 mg | EXTENDED_RELEASE_TABLET | Freq: Every day | ORAL | Status: DC
  Administered 2023-05-29: 600 mg via ORAL
  Filled 2023-05-28 (×2): qty 2

## 2023-05-28 NOTE — Progress Notes (Signed)
 Dr. Daneil Dunker MD notified in treatment team of pt's vitals and status

## 2023-05-28 NOTE — Care Management Important Message (Signed)
 Medicare IM printed and given to social work to give to the patient. ?

## 2023-05-28 NOTE — [facility] Counselor (Addendum)
 Adult Comprehensive Assessment  Patient ID: [name], male   DOB: 06-08-1990, 33 y.o.   MRN: [id number]  Information Source: Information source: Patient  Current Stressors:  Patient states their primary concerns and needs for treatment are:: "a little bit of unsafe behavior" Patient states their goals for this hospitilization and ongoing recovery are:: "get out from the hospital and feel well" Educational / Learning stressors: "no" Employment / Job issues: "no" Family Relationships: "yes - my girlfriendEngineer, petroleum / Lack of resources (include bankruptcy): "yes" Housing / Lack of housing: "yes" Physical health (include injuries & life threatening diseases): "no" Social relationships: "no" Substance abuse: "no" Bereavement / Loss: "no"  Living/Environment/Situation:  Living Arrangements: Parent Living conditions (as described by patient or guardian): "it's good" Who else lives in the home?: "I live with my mom and my sisters." How long has patient lived in current situation?: "I haven't been there for very long." What is atmosphere in current home: Comfortable, Paramedic, Supportive  Family History:  Marital status: Single Are you sexually active?: Yes What is your sexual orientation?: "heterosexual" Has your sexual activity been affected by drugs, alcohol, medication, or emotional stress?: "no" Does patient have children?: Yes How many children?: 1 How is patient's relationship with their children?: "I have a 40 y/o daughter."  When asked about their relationship, he responded, "we are okay."  Childhood History:  By whom was/is the patient raised?: Adoptive parents Additional childhood history information: According to the information in the file, patient reported that he was raised by his adoptive mother.  He had contact with his biological parents when growing up, and maintained a good relationship with them. Description of patient's relationship with caregiver when they  were a child: Patient reported that his relationship with his adoptive mother is like that of a typical "mother and son." Patient's description of current relationship with people who raised him/her: "the same" How were you disciplined when you got in trouble as a child/adolescent?: "I got spanking." Does patient have siblings?: Yes Number of Siblings: 1 Description of patient's current relationship with siblings: "I have siblings from my real family, and one from my adoptive family."  According to the information in the file, patient has an older sister with whom he has a good relationship. Did patient suffer any verbal/emotional/physical/sexual abuse as a child?: No Did patient suffer from severe childhood neglect?: No Has patient ever been sexually abused/assaulted/raped as an adolescent or adult?: No Was the patient ever a victim of a crime or a disaster?: No Witnessed domestic violence?: No Has patient been affected by domestic violence as an adult?: No  Education:  Highest grade of school patient has completed: "I completed the 9th grade" Currently a student?: No Learning disability?: No  Employment/Work Situation:   Employment Situation: Unemployed Patient's Job has Been Impacted by Current Illness: No What is the Longest Time Patient has Held a Job?: "A couple of weeks" Where was the Patient Employed at that Time?: "[employer]" Has Patient ever Been in the [employer]?: No  Financial Resources:   Financial resources: No income Does patient have a Lawyer or guardian?: No  Alcohol/Substance Abuse:   What has been your use of drugs/alcohol within the last 12 months?: "I smoke cigarettes." If attempted suicide, did drugs/alcohol play a role in this?: No If yes, describe treatment: "no" Has alcohol/substance abuse ever caused legal problems?: No  Social Support System:   Describe Community Support System: "I have good support system."  When asked who is  his support  system, he responded, "my whole family and friends." Type of faith/religion: "God" How does patient's faith help to cope with current illness?: "I pray"  Leisure/Recreation:   Do You Have Hobbies?: Yes Leisure and Hobbies: "It depends on the day.  I like riding my bike, fishing, and going to the swimming pool."  Strengths/Needs:   What is the patient's perception of their strengths?: "I'm very articulate, smart and have a good heart." Patient states they can use these personal strengths during their treatment to contribute to their recovery: "It keeps me going." Patient states these barriers may affect/interfere with their treatment: "My anger issues" Patient states these barriers may affect their return to the community: none reported Other important information patient would like considered in planning for their treatment: none reported  Discharge Plan:   Patient states concerns and preferences for aftercare planning are: "I forgot her name, my mom has her information."  Patient was open to recomendations. Patient states they will know when they are safe and ready for discharge when: "I'm ready now" Does patient have access to transportation?: No Does patient have financial barriers related to discharge medications?: No Patient description of barriers related to discharge medications: "My mom will help me." Plan for no access to transportation at discharge: "I need the sheriff to take me home, [employer] or something."  Patient provided his girlfriend's address:  [address] [place], [place] [address].  Patient signed the ROI for his mom only. Will patient be returning to same living situation after discharge?: Yes  Summary/Recommendations:   Summary and Recommendations (to be completed by the evaluator): [name] is a 33 year old man involuntarily admitted to [facility] due to inablity to control his behavior.  He was banding his head on the wall, causing a laceration to the left side of his skull.   He continued this behavior in the hospital.  He reported that family relationships (conflict with girlfriend) and housing cause him most stress.  Patient admitted that inablity tocontrol his anger is hindering his ability to achieve his goals. Patient hadn't been forthcoming about his substance use:  at admission, he tested positive for cocaine and benzodiazepines, however when asked about substance use during the assessment, he reported that he smokes cigarettes.  Patient denied any history of abuse.  He said his adoptive mom raised him, but he maintained positive contact with his biological family.  Patient is unsure where he will go upon discharge:  to his mom's, sister or girlfriend's home.  CSW will continue developing the discharge plan with patient.  While here, [name] can benefit from crisis stabilization, medication management, therapeutic milieu, and referrals for services.   [name], LCSWA 05/28/2023

## 2023-05-28 NOTE — Progress Notes (Signed)
 [facility] MD Progress Note  05/28/2023 11:40 AM [name]  MRN:  [id number] Principal Problem: Schizophrenia (HCC) Diagnosis: Principal Problem:   Schizophrenia (HCC) Active Problems:   Schizoaffective disorder, bipolar type (HCC)   ID & Admission Information: [name] is an 33 y.o. male who  has a past medical history of B12 deficiency (05/27/2023), Folate deficiency (05/27/2023), Schizo affective schizophrenia (HCC), and Schizophrenia (HCC).  He presented on 05/25/2023  6:24 PM for Schizophrenia (HCC).  The patient presented in a decompensated state in the setting of medication noncompliance.  He had been in [facility] years, where he done well on a combination of clozapine , lithium , and Abilify .  Since being discharged from [facility] he has been admitted to [facility] in [facility] multiple times.  Clozapine  was not restarted either hospital, and his mother reports he has been doing poorly.  Chief Complaint: "I am trying to get out of here on Monday"  Subjective:   Case was discussed in the multidisciplinary team. MAR was reviewed and the patient did not take a.m. medications.  He was sent to the ED yesterday for tooth pain and received morphine.  He was noted to be hypotensive with vitals this morning.  He was encouraged to drink fluids and repeat vitals for improved.  He was transferred to the 500 hall yesterday and was noted to be demanding and intrusive and threatening at times.  PRN's in last 24 hours: None  Psychiatric Team made the following recommendations yesterday: Restarted clozapine  to 50 mg nightly Restarted lithium  at 600 mg twice daily Continued Abilify  15 mg daily  Patient was significantly more cogent on exam today.  He spent a good portion of the exam bargaining for discharge.  I explained to him the rationale for hospitalization and the treatment plan to get him back on Clozaril .  After significant psychoeducation, the patient agreed to  continued hospitalization and getting back on Clozaril  and lithium .  His mother states that he used to have an ACT team, but then he refused to see them.  He will need to get reassigned to an ACT team at discharge.  The patient continues to present as disorganized in thought, though less so than yesterday.  He denied auditory or visual hallucinations; however, I believe he is minimizing symptoms as he is bargaining vehemently for discharge.  I believe he remains dangerous to himself and others as he is threatening staff intermittently, and is extremely intrusive.    Past Psychiatric and Medical Medical History:  Past Medical History:  Diagnosis Date   B12 deficiency 05/27/2023   Folate deficiency 05/27/2023   Schizo affective schizophrenia (HCC)    Schizophrenia (HCC)     History reviewed. No pertinent surgical history.  Family History(Medical and Psychiatric): Patient was in [facility] for approximately [facility] years.  According to his mother he has been doing poorly since discharge.  He has had multiple admissions at [facility], and [facility].  Social History:  Social History   Substance and Sexual Activity  Alcohol Use Not Currently     Social History   Substance and Sexual Activity  Drug Use Yes   Types: Cocaine, Marijuana   Comment: crack 06-26-19    Social History   Socioeconomic History   Marital status: Single    Spouse name: Not on file   Number of children: Not on file   Years of education: Not on file   Highest education level: Not on file  Occupational History   Not on file  Tobacco Use   Smoking status: Every Day   Smokeless tobacco: Never  Substance and Sexual Activity   Alcohol use: Not Currently   Drug use: Yes    Types: Cocaine, Marijuana    Comment: crack 06-26-19   Sexual activity: Not on file  Other Topics Concern   Not on file  Social History Narrative   Not on file   Social Drivers of Health   Financial Resource Strain: Not on  file  Food Insecurity: No Food Insecurity (05/25/2023)   Hunger Vital Sign    Worried About Running Out of Food in the Last Year: Never true    Ran Out of Food in the Last Year: Never true  Transportation Needs: No Transportation Needs (05/25/2023)   PRAPARE - Administrator, Civil Service (Medical): No    Lack of Transportation (Non-Medical): No  Physical Activity: Not on file  Stress: Not on file  Social Connections: Unknown (05/22/2021)   Received from [facility]   Social Network    Social Network: Not on file        Current Medications: Current Facility-Administered Medications  Medication Dose Route Frequency Provider Last Rate Last Admin   acetaminophen  (TYLENOL ) tablet 1,000 mg  1,000 mg Oral Q6H PRN Laurelyn Terrero S, MD   1,000 mg at 05/27/23 1316   alum & mag hydroxide-simeth (MAALOX/MYLANTA) 200-200-20 MG/5ML suspension 30 mL  30 mL Oral Q4H PRN Gilman Lade, NP       benzocaine  (ORAJEL) 10 % mucosal gel   Mouth/Throat QID PRN Dorthea Gauze, NP   Given at 05/27/23 1135   cloZAPine  (CLOZARIL ) tablet 100 mg  100 mg Oral QHS Smiley Dung, RPH       And   [START ON 05/29/2023] cloZAPine  (CLOZARIL ) tablet 150 mg  150 mg Oral QHS Smiley Dung, RPH       And   [START ON 05/30/2023] cloZAPine  (CLOZARIL ) tablet 200 mg  200 mg Oral QHS Smiley Dung, RPH       And   [START ON 05/31/2023] cloZAPine  (CLOZARIL ) tablet 250 mg  250 mg Oral QHS Green, Terri L, RPH       And   [START ON 06/01/2023] cloZAPine  (CLOZARIL ) tablet 300 mg  300 mg Oral QHS Green, Terri L, RPH       And   [START ON 06/02/2023] cloZAPine  (CLOZARIL ) tablet 350 mg  350 mg Oral QHS Green, Terri L, RPH       And   [START ON 06/03/2023] cloZAPine  (CLOZARIL ) tablet 400 mg  400 mg Oral QHS Green, Terri L, RPH       And   [START ON 06/04/2023] cloZAPine  (CLOZARIL ) tablet 450 mg  450 mg Oral QHS Green, Terri L, RPH       cyanocobalamin (VITAMIN B12) tablet 1,000 mcg  1,000 mcg Oral  Daily Kitai Purdom S, MD   1,000 mcg at 05/27/23 1004   haloperidol  (HALDOL ) tablet 5 mg  5 mg Oral Q6H PRN Timmothy Foots, MD       And   LORazepam  (ATIVAN ) tablet 2 mg  2 mg Oral Q6H PRN Timmothy Foots, MD       And   diphenhydrAMINE  (BENADRYL ) capsule 50 mg  50 mg Oral Q6H PRN Timmothy Foots, MD       haloperidol  lactate (HALDOL ) injection 5 mg  5 mg Intramuscular TID PRN Lorrene Rosser, Veronique M,  NP       And   diphenhydrAMINE  (BENADRYL ) injection 50 mg  50 mg Intramuscular TID PRN Gilman Lade, NP       And   LORazepam  (ATIVAN ) injection 2 mg  2 mg Intramuscular TID PRN Gilman Lade, NP       haloperidol  lactate (HALDOL ) injection 10 mg  10 mg Intramuscular TID PRN Gilman Lade, NP   10 mg at 05/27/23 0044   And   diphenhydrAMINE  (BENADRYL ) injection 50 mg  50 mg Intramuscular TID PRN Gilman Lade, NP   50 mg at 05/27/23 0044   And   LORazepam  (ATIVAN ) injection 2 mg  2 mg Intramuscular TID PRN Byungura, Veronique M, NP   2 mg at 05/27/23 0043   folic acid (FOLVITE) tablet 1 mg  1 mg Oral Daily Shawni Volkov S, MD   1 mg at 05/27/23 1004   hydrOXYzine  (ATARAX ) tablet 50 mg  50 mg Oral TID PRN Brinklee Cisse S, MD   50 mg at 05/27/23 0018   [START ON 05/29/2023] lithium  carbonate (LITHOBID) ER tablet 600 mg  600 mg Oral QHS Cilicia Borden S, MD       magnesium  hydroxide (MILK OF MAGNESIA) suspension 30 mL  30 mL Oral Daily PRN Byungura, Veronique M, NP       senna (SENOKOT) tablet 17.2 mg  2 tablet Oral QHS Lonny Eisen S, MD   17.2 mg at 05/27/23 2109    Lab Results:  Results for orders placed or performed during the hospital encounter of 05/25/23 (from the past 48 hours)  Folate     Status: Abnormal   Collection Time: 05/26/23  6:35 PM  Result Value Ref Range   Folate 5.7 (L) >5.9 ng/mL    Comment: Performed at [facility], [address], [place], [place] [address]  RPR     Status: None   Collection Time: 05/26/23  6:35 PM   Result Value Ref Range   RPR Ser Ql NON REACTIVE NON REACTIVE    Comment: Performed at [facility], [address] [place], [place] [address]  TSH     Status: None   Collection Time: 05/26/23  6:35 PM  Result Value Ref Range   TSH 1.178 0.350 - 4.500 uIU/mL    Comment: Performed by a 3rd Generation assay with a functional sensitivity of <=0.01 uIU/mL. Performed at [facility], [address], [place], [place] [address]   Vitamin B12     Status: Abnormal   Collection Time: 05/26/23  6:35 PM  Result Value Ref Range   Vitamin B-12 177 (L) 180 - 914 pg/mL    Comment: (NOTE) This assay is not validated for testing neonatal or myeloproliferative syndrome specimens for Vitamin B12 levels. Performed at [facility], [address], [place], [place] [address]   VITAMIN D 25 Hydroxy (Vit-D Deficiency, Fractures)     Status: None   Collection Time: 05/26/23  6:35 PM  Result Value Ref Range   Vit D, 25-Hydroxy 39.67 30 - 100 ng/mL    Comment: (NOTE) Vitamin D deficiency has been defined by the Institute of Medicine  and an Endocrine Society practice guideline as a level of serum 25-OH  vitamin D less than 20 ng/mL (1,2). The Endocrine Society went on to  further define vitamin D insufficiency as a level between 21 and 29  ng/mL (2).  1. IOM (Institute of Medicine). 2010. Dietary reference  intakes for  calcium and D. [place]: [employer]. 2. Holick MF, Binkley Stockton, Bischoff-Ferrari HA, et al. Evaluation,  treatment, and prevention of vitamin D deficiency: an Endocrine  Society clinical practice guideline, JCEM. 2011 Jul; 96(7): 1911-30.  Performed at [facility], [address], [place], [place] [address]   HIV Antibody (routine testing w rflx)     Status: None   Collection Time: 05/26/23  6:35 PM  Result Value Ref Range   HIV Screen 4th Generation wRfx Non Reactive Non Reactive    Comment: Performed at [facility], [address], [place], [place] [address]    Blood Alcohol level:  Lab Results  Component Value Date   [facility] <15 05/24/2023   ETH <15 05/10/2023    Metabolic Disorder Labs: No results found for: "HGBA1C", "MPG" No results found for: "PROLACTIN" Lab Results  Component Value Date   CHOL 121 11/30/2018   TRIG 102 11/30/2018   HDL 39 (L) 11/30/2018   CHOLHDL 3.1 11/30/2018   VLDL 20 11/30/2018   LDLCALC 62 11/30/2018    Physical Findings: AIMS:  , ,  ,  ,    CIWA:    COWS:     Psychiatric Specialty Exam:  Presentation  General Appearance: Disheveled  Eye Contact: Fair  Speech: Blocked; Slow  Speech Volume: Decreased  Handedness: Right   Mood and Affect  Mood: Dysphoric; Irritable  Affect: Full Range; Blunt   Thought Process  Thought Processes: Disorganized  Descriptions of Associations: Tangential  Orientation: Full (Time, Place and Person)  Thought Content: Delusions; Tangential  History of Schizophrenia/Schizoaffective disorder: Yes  Duration of Psychotic Symptoms: NA Hallucinations: Hallucinations: Auditory  Ideas of Reference: None  Suicidal Thoughts: Suicidal Thoughts: No  Homicidal Thoughts: Homicidal Thoughts: No   Sensorium  Memory: Immediate Fair  Judgment: Poor  Insight: Poor   Executive Functions  Concentration: Fair  Attention Span: Fair  Recall: Fair  Fund of Knowledge: Fair  Language: Good   Psychomotor Activity  Psychomotor Activity: Psychomotor Activity: Decreased   Assets  Assets: Communication Skills; Desire for Improvement; Housing   Sleep  Sleep: Sleep: Fair   Musculoskeletal: Strength & Muscle Tone: within normal limits Gait & Station: normal Patient leans: N/A   Physical Exam: General: Sitting comfortably. NAD. HEENT: Normocephalic, atraumatic, MMM, EMOI Lungs: no increased work of breathing noted Heart: no cyanosis Abdomen: Non distended Musculoskeletal: FROM. No obvious  deformities Skin: Warm, dry, intact. No rashes noted Neuro: No obvious focal deficits.  Gait and station are normal  Review of Systems  Constitutional: Negative.   HENT: Negative.    Eyes: Negative.   Respiratory: Negative.    Cardiovascular: Negative.   Gastrointestinal: Negative.   Genitourinary: Negative.   Skin: Negative.   Neurological: Negative.   Psychiatric/Behavioral: Per mental status exam   Blood pressure (!) 102/58, pulse 83, temperature 98.4 F (36.9 C), temperature source Oral, resp. rate 16, height 5\' 11"  (1.803 m), weight 67.2 kg, SpO2 100%. Body mass index is 20.66 kg/m.  ASSESSMENT: [name] is an 33 y.o. male who  has a past medical history of B12 deficiency (05/27/2023), Folate deficiency (05/27/2023), Schizo affective schizophrenia (HCC), and Schizophrenia (HCC).  He presented on 05/25/2023  6:24 PM for Schizophrenia (HCC).  The patient presented in a decompensated state in the setting of medication noncompliance.  He had been in [facility], where he done well on a combination of clozapine , lithium , and Abilify .  Since being discharged from [facility] he has been admitted to [facility] in [facility] multiple times.  Clozapine  was not restarted either hospital, and his mother reports he has been doing poorly.  Will plan on titrating Clozaril  back to 450 mg nightly, restarting lithium , and getting the patient back on Abilify  Maintena injection.   Diagnoses / Active Problems: Patient Active Problem List   Diagnosis Date Noted   Schizoaffective disorder, bipolar type (HCC) 05/25/2023   Schizophrenia (HCC) 05/25/2023   Cocaine use 05/11/2023   Aggressive behavior 05/11/2023   Cocaine abuse (HCC) 05/18/2022   Schizoaffective disorder (HCC) 06/27/2019   Schizophrenia, paranoid (HCC) 03/12/2019   Cocaine abuse with intoxication (HCC)    Self-inflicted laceration of right wrist (HCC) 11/30/2018   Suicidal ideation 11/29/2018   Cocaine  abuse with cocaine-induced mood disorder (HCC) 11/06/2018   Agitation    Cannabis abuse 11/10/2017      PLAN: Safety and Monitoring:  -- Involuntary admission to inpatient psychiatric unit for safety, stabilization and treatment  -- Daily contact with patient to assess and evaluate symptoms and progress in treatment  -- Patient's case to be discussed in multi-disciplinary team meeting  -- Observation Level : q15 minute checks  -- Vital signs:  q12 hours  -- Precautions: suicide, elopement, and assault  2. 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/06/2018   Agitation    Cannabis abuse 11/10/2017     Scheduled Medications:  cloZAPine   100 mg Oral QHS   And   [START ON 05/29/2023] cloZAPine   150 mg Oral QHS   And   [START ON 05/30/2023] cloZAPine   200 mg Oral QHS   And   [START ON 05/31/2023] cloZAPine   250 mg Oral QHS   And   [START ON 06/01/2023] cloZAPine   300 mg Oral QHS   And   [START ON 06/02/2023] cloZAPine   350 mg Oral QHS   And   [START ON 06/03/2023] cloZAPine   400 mg Oral QHS   And   [START ON 06/04/2023] cloZAPine   450 mg Oral QHS   vitamin B-12  1,000 mcg Oral Daily   folic acid  1 mg Oral Daily   [START ON 05/29/2023] lithium  carbonate  600 mg Oral QHS   senna  2 tablet Oral QHS    As Needed Medications: acetaminophen , alum & mag hydroxide-simeth, benzocaine , haloperidol  **AND** LORazepam  **AND** diphenhydrAMINE , haloperidol  lactate **AND** diphenhydrAMINE   **AND** LORazepam , haloperidol  lactate **AND** diphenhydrAMINE  **AND** LORazepam , hydrOXYzine , magnesium  hydroxide    3. Medical Issues Being Addressed:   -- B12 deficiency, folate deficiency, tooth pain  Labs reviewed, unremarkable with the exception of: Folate 5.7, B12 177, UDS positive for benzos and cocaine     4. Discharge Planning:   -- Social work and case management to assist with discharge planning and identification of hospital follow-up needs prior to discharge  -- Estimated LOS: 10 to 14 days  -- Discharge Concerns: Need to establish a safety plan; Medication compliance and effectiveness  -- Discharge Goals: Return home with outpatient referrals for  mental health follow-up including medication management/psychotherapy  5. Short Term Goals:  Improve ability to identify changes in lifestyle to reduce recurrence of condition, verbalize feelings, disclose and discuss suicidal ideas, demonstrate self-control, identify and develop effective coping behaviors, compliance with prescribed medications, identify triggers associated with substance abuse/mental health issues, participate in unit milieu and in scheduled group therapies   6. Long Term Goals: Improvement in symptoms so the patient is ready for discharge   --The risks/benefits/side-effects/alternatives to the medications above were discussed in detail with the patient and time was given for questions. The patient provided informed consent.   -- Metabolic profile and EKG monitoring obtained while on an atypical antipsychotic and listed in the EHR    Total Time Spent in Direct Patient Care:  I personally spent 35 minutes on the unit in direct patient care. The direct patient care time included face-to-face time with the patient, reviewing the patient's chart, communicating with other professionals, and coordinating care. Greater than 50% of this time was spent in counseling or coordinating care with the patient regarding goals of  hospitalization, psycho-education, and discharge planning needs.      Clair Crews, MD Psychiatrist  05/28/2023, 11:40 AM   I certify that inpatient services furnished can reasonably be expected to improve the patient's condition.    Portions of this note were created using voice recognition software. Minor syntax errors, grammatical content, spelling, or punctuation errors may have occurred unintentionally. Please notify the [facility] if the meaning of any statement is unclear.

## 2023-05-28 NOTE — B[facility] Group Notes (Signed)
 Spirituality group facilitated by Kathleen Argue, BCC.  Group Description: Group focused on topic of hope. Patients participated in facilitated discussion around topic, connecting with one another around experiences and definitions for hope. Group members engaged with visual explorer photos, reflecting on what hope looks like for them today. Group engaged in discussion around how their definitions of hope are present today in hospital.  Modalities: Psycho-social ed, Adlerian, Narrative, MI  Patient Progress: Did not attend.

## 2023-05-28 NOTE — Progress Notes (Signed)
 6045 Pt VS:  BP 92/63 (BP Location: Right Arm)   Pulse (!) 103   Temp 98.7 F (37.1 C) (Oral)   Resp 18   Ht 5\' 11"  (1.803 m)   Wt 67.2 kg   SpO2 99%   BMI 20.66 kg/m   He c/o feeling lightheaded, drank 300 mL of gatorade, and escorted back to bed for safety. Pt became irritable and voiced with pressured speech, "I need to get out of here. I can't go to the cafeteria to get what I want." Reoriented pt to safety first protocol.  Lab draw postponed since pt not safe, dizzy, and unsteady on his feet.  Pt escorted back to bed.

## 2023-05-28 NOTE — B[facility] Suicide Risk Assessment (Signed)
 [facility] INPATIENT:  Family/Significant Other Suicide Prevention Education  Suicide Prevention Education:  Contact Attempts: [name] (mom) [phone], (name of family member/significant other) has been identified by the patient as the family member/significant other with whom the patient will be residing, and identified as the person(s) who will aid the patient in the event of a mental health crisis.  With written consent from the patient, two attempts were made to provide suicide prevention education, prior to and/or following the patient's discharge.  We were unsuccessful in providing suicide prevention education.  A suicide education pamphlet was given to the patient to share with family/significant other.  Date and time of first attempt:  05/28/2023, 5:45 PM.  The phone didn't ring.  The message said: "The mailbox is full and can't accept messages at this time."    Amos Balint, LCSWA 05/28/2023, 5:49 PM

## 2023-05-28 NOTE — Progress Notes (Signed)
   05/28/23 0832  Psych Admission Type (Psych Patients Only)  Admission Status Involuntary  Psychosocial Assessment  Patient Complaints Anxiety;Helplessness  Eye Contact Brief  Facial Expression Animated  Affect Preoccupied  Speech Soft  Interaction Minimal  Motor Activity Slow  Appearance/Hygiene Disheveled  Behavior Characteristics Cooperative  Mood Pleasant  Thought Process  Coherency Circumstantial  Content Blaming others  Delusions None reported or observed  Perception WDL  Hallucination None reported or observed  Judgment Limited  Confusion None  Danger to Self  Current suicidal ideation? Denies  Agreement Not to Harm Self Yes  Description of Agreement verbal  Danger to Others  Danger to Others None reported or observed

## 2023-05-28 NOTE — [facility] Group Notes (Signed)
 Adult Psychoeducational Group Note  Date:  05/28/2023 Time:  7:03 PM  Group Topic/Focus:  Goals Group:   The focus of this group is to help patients establish daily goals to achieve during treatment and discuss how the patient can incorporate goal setting into their daily lives to aide in recovery. Orientation:   The focus of this group is to educate the patient on the purpose and policies of crisis stabilization and provide a format to answer questions about their admission.  The group details unit policies and expectations of patients while admitted.  Participation Level:  Did Not Attend  Participation Quality:    Affect:    Cognitive:    Insight:   Engagement in Group:    Modes of Intervention:    Additional Comments:    Tillman Kazmierski O 05/28/2023, 7:03 PM

## 2023-05-28 NOTE — Group Note (Signed)
 Date:  05/28/2023 Time:  8:36 PM  Group Topic/Focus:  Wrap-Up Group:   The focus of this group is to help patients review their daily goal of treatment and discuss progress on daily workbooks.    Participation Level:  Did Not Attend   [name] 05/28/2023, 8:36 PM

## 2023-05-28 NOTE — Group Note (Signed)
 Recreation Therapy Group Note   Group Topic:Problem Solving  Group Date: 05/28/2023 Start Time: 1020 End Time: 1040 Facilitators: Darris Carachure-McCall, LRT,CTRS Location: 500 Hall Dayroom   Group Topic: Communication, Team Building, Problem Solving  Goal Area(s) Addresses:  Patient will effectively work with peer towards shared goal.  Patient will identify skills used to make activity successful.  Patient will identify how skills used during activity can be used to reach post d/c goals.   Intervention: STEM Activity  Activity: Stage manager. In teams of 3-5, patients were given 12 plastic drinking straws and an equal length of masking tape. Using the materials provided, patients were asked to build a landing pad to catch a golf ball dropped from approximately 5 feet in the air. All materials were required to be used by the team in their design. LRT facilitated post-activity discussion.  Education: Pharmacist, community, Scientist, physiological, Discharge Planning   Education Outcome: Acknowledges education/In group clarification offered/Needs additional education.    Affect/Mood: Flat   Participation Level: Didn't participate    Clinical Observations/Individualized Feedback: Pt did not participate in group. Pt came in as group was ending.    Plan: Continue to engage patient in RT group sessions 2-3x/week.   Adayah Arocho-McCall, LRT,CTRS 05/28/2023 11:28 AM

## 2023-05-28 NOTE — Progress Notes (Signed)
   05/28/23 0000  Psych Admission Type (Psych Patients Only)  Admission Status Involuntary  Psychosocial Assessment  Patient Complaints None (Pt presented without irritability and was polite with interacting.  Pt reported BM today and denied SI/HI/AVH. He stated his goal was to "get good rest." He resumed resting after med pass without issue.)  Eye Contact Fair  Facial Expression Animated  Affect Appropriate to circumstance  Speech Soft  Interaction Assertive;Other (Comment);Minimal (Friendly, polite)  Motor Activity Slow  Appearance/Hygiene Other (Comment) (Pt reported showering today)  Behavior Characteristics Cooperative  Mood Pleasant  Thought Process  Coherency WDL  Content WDL  Delusions None reported or observed  Perception WDL  Hallucination None reported or observed  Judgment Limited  Confusion WDL  Danger to Self  Current suicidal ideation? Denies  Agreement Not to Harm Self Yes  Description of Agreement Verbal  Danger to Others  Danger to Others None reported or observed

## 2023-05-28 NOTE — Care Management Important Message (Signed)
 Patient informed of right to appeal discharge, provided phone number to [facility]. Patient expressed no interest in appealing discharge at this time. CSW will continue to monitor situation.   Maly Lemarr, LCSWA 05/28/2023

## 2023-05-28 NOTE — Plan of Care (Signed)
   Problem: Education: Goal: Knowledge of [place] [facility] Education information/materials will improve Outcome: Progressing Goal: Emotional status will improve Outcome: Progressing   Problem: Coping: Goal: Ability to verbalize frustrations and anger appropriately will improve Outcome: Progressing

## 2023-05-29 DIAGNOSIS — F200 Paranoid schizophrenia: Secondary | ICD-10-CM | POA: Diagnosis not present

## 2023-05-29 MED ORDER — CLOZAPINE 25 MG PO TABS: 250.0000 mg | ORAL_TABLET | Freq: Every day | ORAL | Status: DC

## 2023-05-29 MED ORDER — CLOZAPINE 100 MG PO TABS
300.0000 mg | ORAL_TABLET | Freq: Every day | ORAL | Status: DC
Start: 2023-05-30 — End: 2023-05-29

## 2023-05-29 MED ORDER — CLOZAPINE 100 MG PO TABS: 200.0000 mg | ORAL_TABLET | Freq: Every day | ORAL | Status: DC

## 2023-05-29 MED ORDER — CLOZAPINE 100 MG PO TABS
300.0000 mg | ORAL_TABLET | Freq: Every day | ORAL | Status: AC
  Administered 2023-05-30: 300 mg via ORAL
  Filled 2023-05-29: qty 3

## 2023-05-29 MED ORDER — CLOZAPINE 100 MG PO TABS
200.0000 mg | ORAL_TABLET | Freq: Every day | ORAL | Status: AC
  Administered 2023-05-29: 200 mg via ORAL
  Filled 2023-05-29: qty 2

## 2023-05-29 MED ORDER — CLOZAPINE 25 MG PO TABS: 450.0000 mg | ORAL_TABLET | Freq: Every day | ORAL | Status: DC

## 2023-05-29 MED ORDER — CLOZAPINE 100 MG PO TABS: 400.0000 mg | ORAL_TABLET | Freq: Every day | ORAL | Status: DC

## 2023-05-29 MED ORDER — CLOZAPINE 100 MG PO TABS
400.0000 mg | ORAL_TABLET | Freq: Every day | ORAL | Status: AC
  Administered 2023-05-31: 400 mg via ORAL
  Filled 2023-05-29: qty 4

## 2023-05-29 MED ORDER — CLOZAPINE 100 MG PO TABS: 300.0000 mg | ORAL_TABLET | Freq: Every day | ORAL | Status: DC

## 2023-05-29 MED ORDER — CLOZAPINE 25 MG PO TABS: 350.0000 mg | ORAL_TABLET | Freq: Every day | ORAL | Status: DC

## 2023-05-29 MED ORDER — CLOZAPINE 25 MG PO TABS
450.0000 mg | ORAL_TABLET | Freq: Every day | ORAL | Status: AC
  Administered 2023-06-01: 450 mg via ORAL
  Filled 2023-05-29: qty 2

## 2023-05-29 NOTE — Plan of Care (Signed)
   Problem: Education: Goal: Knowledge of [facility] Education information/materials will improve Outcome: Progressing Goal: Emotional status will improve Outcome: Progressing Goal: Mental status will improve Outcome: Progressing Goal: Verbalization of understanding the information provided will improve Outcome: Progressing

## 2023-05-29 NOTE — Plan of Care (Signed)
  Problem: Education: Goal: Emotional status will improve Outcome: Progressing Goal: Mental status will improve Outcome: Progressing   Problem: Coping: Goal: Ability to verbalize frustrations and anger appropriately will improve Outcome: Progressing   Problem: Safety: Goal: Periods of time without injury will increase Outcome: Progressing

## 2023-05-29 NOTE — Progress Notes (Signed)
 [facility] MD Progress Note  05/29/2023 11:37 AM [name]  MRN:  [id number] Principal Problem: Schizophrenia (HCC) Diagnosis: Principal Problem:   Schizophrenia (HCC) Active Problems:   Schizoaffective disorder, bipolar type (HCC)   ID & Admission Information: [name] is an 33 y.o. male who  has a past medical history of B12 deficiency (05/27/2023), Folate deficiency (05/27/2023), Schizo affective schizophrenia (HCC), and Schizophrenia (HCC).  He presented on 05/25/2023  6:24 PM for Schizophrenia (HCC).  The patient presented in a decompensated state in the setting of medication noncompliance.  He had been in [facility] for 3 years, where he done well on a combination of clozapine , lithium , and Abilify .  Since being discharged from [facility] he has been admitted to [facility] in [facility] multiple times.  Clozapine  was not restarted either hospital, and his mother reports he has been doing poorly.  Subjective:   Case was discussed in the multidisciplinary team. MAR was reviewed and the patient was compliant with medications.  No acute events occurred overnight.  Vital signs this morning were within normal limits.  PRN's in last 24 hours: None  Psychiatric Team made the following recommendations yesterday: Increased clozapine  to 100 mg nightly Change lithium  to 600 mg nightly due to hypotension Discontinued Abilify  due to hypotension  The patient was seen in his room during rounds.  Much like last yesterday, he has been most of the interview bargaining for discharge.  Once again, we discussed the rationale of keeping him in the hospital while restarting his clozapine .  He is adamant that he has never had a problem with clozapine  in the past, and wants to increase the titration schedule.  The risk and benefits were discussed at length, and the patient feels like the benefits outweigh the risks.  He denies suicidal or homicidal ideation.  He denies auditory or visual  hallucinations, but I believe he is minimizing to facilitate expedited discharge.  Thought disorganization is significantly improved since admission, but is not back to baseline.  We discussed increasing lithium  at bedtime back to previous home dose of 1200 mg.  As the patient previously did well on a combination of Abilify  and clozapine , we will consider restarting Abilify  if vitals remain stable.   Past Psychiatric and Medical Medical History:  Past Medical History:  Diagnosis Date   B12 deficiency 05/27/2023   Folate deficiency 05/27/2023   Schizo affective schizophrenia (HCC)    Schizophrenia (HCC)     History reviewed. No pertinent surgical history.  Family History(Medical and Psychiatric): Patient was in [facility] for approximately 3 years.  According to his mother he has been doing poorly since discharge.  He has had multiple admissions at [facility], and [facility].  Social History:  Social History   Substance and Sexual Activity  Alcohol Use Not Currently     Social History   Substance and Sexual Activity  Drug Use Yes   Types: Cocaine, Marijuana   Comment: crack 06-26-19    Social History   Socioeconomic History   Marital status: Single    Spouse name: Not on file   Number of children: Not on file   Years of education: Not on file   Highest education level: Not on file  Occupational History   Not on file  Tobacco Use   Smoking status: Every Day   Smokeless tobacco: Never  Substance and Sexual Activity   Alcohol use: Not Currently   Drug use: Yes    Types:  Cocaine, Marijuana    Comment: crack 06-26-19   Sexual activity: Not on file  Other Topics Concern   Not on file  Social History Narrative   Not on file   Social Drivers of Health   Financial Resource Strain: Not on file  Food Insecurity: No Food Insecurity (05/25/2023)   Hunger Vital Sign    Worried About Running Out of Food in the Last Year: Never true    Ran Out of Food in the  Last Year: Never true  Transportation Needs: No Transportation Needs (05/25/2023)   PRAPARE - Administrator, Civil Service (Medical): No    Lack of Transportation (Non-Medical): No  Physical Activity: Not on file  Stress: Not on file  Social Connections: Unknown (05/22/2021)   Received from [facility], [facility]   Social Network    Social Network: Not on file        Current Medications: Current Facility-Administered Medications  Medication Dose Route Frequency Provider Last Rate Last Admin   acetaminophen  (TYLENOL ) tablet 1,000 mg  1,000 mg Oral Q6H PRN Isidra Mings S, MD   1,000 mg at 05/27/23 1316   alum & mag hydroxide-simeth (MAALOX/MYLANTA) 200-200-20 MG/5ML suspension 30 mL  30 mL Oral Q4H PRN Byungura, Veronique M, NP       benzocaine  (ORAJEL) 10 % mucosal gel   Mouth/Throat QID PRN Dorthea Gauze, NP   Given at 05/28/23 1607   cloZAPine  (CLOZARIL ) tablet 200 mg  200 mg Oral QHS Timmothy Foots, MD       And   [START ON 05/30/2023] cloZAPine  (CLOZARIL ) tablet 300 mg  300 mg Oral QHS Timmothy Foots, MD       And   [START ON 05/31/2023] cloZAPine  (CLOZARIL ) tablet 400 mg  400 mg Oral QHS Timmothy Foots, MD       And   [START ON 06/01/2023] cloZAPine  (CLOZARIL ) tablet 450 mg  450 mg Oral QHS Timmothy Foots, MD       cyanocobalamin  (VITAMIN B12) tablet 1,000 mcg  1,000 mcg Oral Daily Casha Estupinan S, MD   1,000 mcg at 05/29/23 0935   haloperidol  (HALDOL ) tablet 5 mg  5 mg Oral Q6H PRN Timmothy Foots, MD       And   LORazepam  (ATIVAN ) tablet 2 mg  2 mg Oral Q6H PRN Timmothy Foots, MD       And   diphenhydrAMINE  (BENADRYL ) capsule 50 mg  50 mg Oral Q6H PRN Timmothy Foots, MD       haloperidol  lactate (HALDOL ) injection 5 mg  5 mg Intramuscular TID PRN Gilman Lade, NP       And   diphenhydrAMINE  (BENADRYL ) injection 50 mg  50 mg Intramuscular TID PRN Gilman Lade, NP       And   LORazepam  (ATIVAN ) injection 2 mg  2 mg Intramuscular TID PRN  Gilman Lade, NP       haloperidol  lactate (HALDOL ) injection 10 mg  10 mg Intramuscular TID PRN Gilman Lade, NP   10 mg at 05/27/23 0044   And   diphenhydrAMINE  (BENADRYL ) injection 50 mg  50 mg Intramuscular TID PRN Gilman Lade, NP   50 mg at 05/27/23 0044   And   LORazepam  (ATIVAN ) injection 2 mg  2 mg Intramuscular TID PRN Gilman Lade, NP   2 mg at 05/27/23 0043   folic acid  (FOLVITE ) tablet 1 mg  1 mg Oral Daily  Timmothy Foots, MD   1 mg at 05/29/23 1610   hydrOXYzine  (ATARAX ) tablet 50 mg  50 mg Oral TID PRN Naasir Carreira S, MD   50 mg at 05/28/23 2040   lithium  carbonate (LITHOBID ) ER tablet 600 mg  600 mg Oral QHS Timmothy Foots, MD       magnesium  hydroxide (MILK OF MAGNESIA) suspension 30 mL  30 mL Oral Daily PRN Gilman Lade, NP       senna (SENOKOT) tablet 17.2 mg  2 tablet Oral QHS Timmothy Foots, MD   17.2 mg at 05/28/23 2038    Lab Results:  Results for orders placed or performed during the hospital encounter of 05/25/23 (from the past 48 hours)  CBC     Status: None   Collection Time: 05/28/23  6:39 PM  Result Value Ref Range   WBC 7.3 4.0 - 10.5 K/uL   RBC 4.61 4.22 - 5.81 MIL/uL   Hemoglobin 13.4 13.0 - 17.0 g/dL   HCT 96.0 45.4 - 09.8 %   MCV 91.8 80.0 - 100.0 fL   MCH 29.1 26.0 - 34.0 pg   MCHC 31.7 30.0 - 36.0 g/dL   RDW 11.9 14.7 - 82.9 %   Platelets 216 150 - 400 K/uL   nRBC 0.0 0.0 - 0.2 %    Comment: Performed at [facility], [address] [place], [place] [address]  Differential     Status: None   Collection Time: 05/28/23  6:39 PM  Result Value Ref Range   Neutrophils Relative % 63 %   Neutro Abs 4.6 1.7 - 7.7 K/uL   Lymphocytes Relative 29 %   Lymphs Abs 2.2 0.7 - 4.0 K/uL   Monocytes Relative 7 %   Monocytes Absolute 0.5 0.1 - 1.0 K/uL   Eosinophils Relative 1 %   Eosinophils Absolute 0.0 0.0 - 0.5 K/uL   Basophils Relative 0 %   Basophils Absolute 0.0 0.0 - 0.1 K/uL    Immature Granulocytes 0 %   Abs Immature Granulocytes 0.02 0.00 - 0.07 K/uL    Comment: Performed at [facility], [address] [place], [place] [address]  Troponin I (High Sensitivity)     Status: None   Collection Time: 05/28/23  6:39 PM  Result Value Ref Range   Troponin I (High Sensitivity) <2 <18 ng/L    Comment: (NOTE) Elevated high sensitivity troponin I (hsTnI) values and significant  changes across serial measurements may suggest ACS but many other  chronic and acute conditions are known to elevate hsTnI results.  Refer to the "Links" section for chest pain algorithms and additional  guidance. Performed at [facility], [address] [place], [place] [address]     Blood Alcohol level:  Lab Results  Component Value Date   [facility] <15 05/24/2023   ETH <15 05/10/2023    Metabolic Disorder Labs: No results found for: "HGBA1C", "MPG" No results found for: "PROLACTIN" Lab Results  Component Value Date   CHOL 121 11/30/2018   TRIG 102 11/30/2018   HDL 39 (L) 11/30/2018   CHOLHDL 3.1 11/30/2018   VLDL 20 11/30/2018   LDLCALC 62 11/30/2018    Physical Findings: AIMS:  , ,  ,  ,    CIWA:    COWS:     Psychiatric Specialty Exam:  Presentation  General Appearance: Disheveled  Eye Contact: Fair  Speech: Blocked; Slow  Speech Volume: Decreased  Handedness: Right  Mood and Affect  Mood: Dysphoric; Irritable  Affect: Full Range; Blunt   Thought Process  Thought Processes: Disorganized  Descriptions of Associations: Tangential  Orientation: Full (Time, Place and Person)  Thought Content: Delusions; Tangential  History of Schizophrenia/Schizoaffective disorder: Yes  Duration of Psychotic Symptoms: NA Hallucinations: Hallucinations: Auditory  Ideas of Reference: None  Suicidal Thoughts: Suicidal Thoughts: No  Homicidal Thoughts: Homicidal Thoughts: No   Sensorium  Memory: Immediate Fair  Judgment:  Poor  Insight: Poor   Executive Functions  Concentration: Fair  Attention Span: Fair  Recall: Fair  Fund of Knowledge: Fair  Language: Good   Psychomotor Activity  Psychomotor Activity: Psychomotor Activity: Decreased   Assets  Assets: Communication Skills; Desire for Improvement; Housing   Sleep  Sleep: Sleep: Fair   Musculoskeletal: Strength & Muscle Tone: within normal limits Gait & Station: normal Patient leans: N/A   Physical Exam: General: Sitting comfortably. NAD. HEENT: Normocephalic, atraumatic, MMM, EMOI Lungs: no increased work of breathing noted Heart: no cyanosis Abdomen: Non distended Musculoskeletal: FROM. No obvious deformities Skin: Warm, dry, intact. No rashes noted Neuro: No obvious focal deficits.  Gait and station are normal  Review of Systems  Constitutional: Negative.   HENT: Negative.    Eyes: Negative.   Respiratory: Negative.    Cardiovascular: Negative.   Gastrointestinal: Negative.   Genitourinary: Negative.   Skin: Negative.   Neurological: Negative.   Psychiatric/Behavioral: Per mental status exam   Blood pressure 108/84, pulse 87, temperature 98.3 F (36.8 C), temperature source Oral, resp. rate 16, height 5\' 11"  (1.803 m), weight 67.2 kg, SpO2 100%. Body mass index is 20.66 kg/m.  ASSESSMENT: [name] is an 33 y.o. male who  has a past medical history of B12 deficiency (05/27/2023), Folate deficiency (05/27/2023), Schizo affective schizophrenia (HCC), and Schizophrenia (HCC).  He presented on 05/25/2023  6:24 PM for Schizophrenia (HCC).  The patient presented in a decompensated state in the setting of medication noncompliance.  He had been in [facility] years, where he done well on a combination of clozapine , lithium , and Abilify .  Since being discharged from [facility] he has been admitted to [facility] in [facility] multiple times.  Clozapine  was not restarted either hospital, and his mother  reports he has been doing poorly.  Will plan on titrating Clozaril  back to 450 mg nightly, restarting lithium , and getting the patient back on Abilify  Maintena injection.   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   Agitation    Cannabis abuse 11/10/2017     Scheduled Medications:  cloZAPine   200 mg Oral QHS   And   [START ON 05/30/2023] cloZAPine   300 mg Oral QHS   And    [START ON 05/31/2023] cloZAPine   400 mg Oral QHS   And   [START ON 06/01/2023] cloZAPine   450 mg Oral QHS   vitamin B-12  1,000 mcg Oral Daily   folic acid   1 mg Oral Daily   lithium  carbonate  600 mg Oral QHS   senna  2 tablet Oral QHS    As Needed Medications: acetaminophen , alum & mag hydroxide-simeth, benzocaine , haloperidol  **AND** LORazepam  **AND** diphenhydrAMINE , haloperidol  lactate **AND** diphenhydrAMINE  **AND** LORazepam , haloperidol  lactate **AND** diphenhydrAMINE  **AND** LORazepam , hydrOXYzine , magnesium  hydroxide    3. Medical Issues Being Addressed:   -- B12 deficiency, folate deficiency, tooth pain  Labs reviewed, unremarkable with the exception of: Folate 5.7, B12 177, UDS positive for benzos and cocaine     4. Discharge Planning:   -- Social work and case management to assist with discharge planning and identification of hospital follow-up needs prior to discharge  -- Estimated LOS: 10 to 14 days  -- Discharge Concerns: Need to establish a safety plan; Medication compliance and effectiveness  -- Discharge Goals: Return home with outpatient referrals for mental health follow-up including medication management/psychotherapy  5. Short Term Goals:  Improve ability to identify changes in lifestyle to reduce recurrence of condition, verbalize feelings, disclose and discuss suicidal ideas, demonstrate self-control, identify and develop effective coping behaviors, compliance with prescribed medications, identify triggers associated with substance abuse/mental health issues, participate in unit milieu and in scheduled group therapies   6. Long Term Goals: Improvement in symptoms so the patient is ready for discharge   --The risks/benefits/side-effects/alternatives to the medications above were discussed in detail with the patient and time was given for questions. The patient provided informed consent.   -- Metabolic profile and EKG monitoring obtained while on an atypical  antipsychotic and listed in the EHR    Total Time Spent in Direct Patient Care:  I personally spent 35 minutes on the unit in direct patient care. The direct patient care time included face-to-face time with the patient, reviewing the patient's chart, communicating with other professionals, and coordinating care. Greater than 50% of this time was spent in counseling or coordinating care with the patient regarding goals of hospitalization, psycho-education, and discharge planning needs.      Clair Crews, MD Psychiatrist  05/29/2023, 11:37 AM   I certify that inpatient services furnished can reasonably be expected to improve the patient's condition.    Portions of this note were created using voice recognition software. Minor syntax errors, grammatical content, spelling, or punctuation errors may have occurred unintentionally. Please notify the [facility] if the meaning of any statement is unclear.

## 2023-05-29 NOTE — Progress Notes (Signed)
   05/29/23 1000  Psych Admission Type (Psych Patients Only)  Admission Status Involuntary  Psychosocial Assessment  Patient Complaints None  Eye Contact Brief  Facial Expression Animated  Affect Preoccupied  Speech Soft  Interaction Minimal  Motor Activity Slow  Appearance/Hygiene Disheveled  Behavior Characteristics Cooperative  Mood Pleasant  Thought Process  Coherency Circumstantial  Content WDL  Delusions None reported or observed  Perception WDL  Hallucination None reported or observed  Judgment Poor  Confusion None  Danger to Self  Current suicidal ideation? Denies  Agreement Not to Harm Self Yes  Description of Agreement verbal  Danger to Others  Danger to Others None reported or observed

## 2023-05-29 NOTE — Progress Notes (Signed)
 Pt became increasingly agitated and anxious. Pt stated he tried to apologize to a staff member for a previous incident where he became agitated and aggressive with staff.   The staff member accepted the patient's apology and proceeded to continue their work. However, the patient stated the interaction felt "funny" and he did not like it. He demanded the staff member to leave the hall. Other staff processed with the patient and reassured him everyone is here to help him.   He was given a PRN medication for anxiety along with his bedtime medications. He then became agitated again because he did not like the provider was increasing his Clozaril  only by 50 mg increments. He stated he did not want to be here longer than he has to and he demanded the titration of medications faster. Pt educated to speak with the provider in the AM.

## 2023-05-29 NOTE — Group Note (Signed)
 Date:  05/29/2023 Time:  8:53 PM  Group Topic/Focus:  Wrap-Up Group:   The focus of this group is to help patients review their daily goal of treatment and discuss progress on daily workbooks.    Participation Level:  Active  Participation Quality:  Appropriate  Affect:  Appropriate  Cognitive:  Appropriate  Insight: Appropriate  Engagement in Group:  Engaged  Modes of Intervention:  Education  Additional Comments:  Patient attended and participated in group tonight. He reports that what made for a good day was being able to control his anger, also playing basket ball.  Eugena Herter Dacosta 05/29/2023, 8:53 PM

## 2023-05-30 DIAGNOSIS — F200 Paranoid schizophrenia: Secondary | ICD-10-CM | POA: Diagnosis not present

## 2023-05-30 LAB — HIGH SENSITIVITY CRP: CRP, High Sensitivity: 8.77 mg/L — ABNORMAL HIGH (ref 0.00–3.00)

## 2023-05-30 MED ORDER — ASPIRIN 325 MG PO TABS: 325.0000 mg | ORAL_TABLET | Freq: Four times a day (QID) | ORAL | Status: DC | PRN

## 2023-05-30 MED ORDER — LITHIUM CARBONATE ER 300 MG PO TBCR
1200.0000 mg | EXTENDED_RELEASE_TABLET | Freq: Every day | ORAL | Status: DC
  Administered 2023-05-30 – 2023-06-04 (×6): 1200 mg via ORAL
  Filled 2023-05-30 (×6): qty 4

## 2023-05-30 NOTE — Plan of Care (Signed)
  Problem: Education: Goal: Mental status will improve Outcome: Progressing   Problem: Coping: Goal: Ability to verbalize frustrations and anger appropriately will improve Outcome: Progressing   Problem: Safety: Goal: Periods of time without injury will increase Outcome: Progressing
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
## 2023-05-30 NOTE — Progress Notes (Signed)
 Patient pacing, being loud and disruptive, glaring at staff, asking to be discharged, patient appears agitated. Patient offered and administered Haldol  5mg , Ativan  2mg  and Benadryl  50mg  PO.

## 2023-05-30 NOTE — Progress Notes (Signed)
   05/30/23 0000  Psych Admission Type (Psych Patients Only)  Admission Status Involuntary  Psychosocial Assessment  Patient Complaints Worrying  Eye Contact Fair  Facial Expression Animated  Affect Preoccupied  Speech Logical/coherent  Interaction Assertive  Motor Activity Slow  Appearance/Hygiene Unremarkable  Behavior Characteristics Cooperative;Appropriate to situation  Mood Pleasant  Thought Process  Coherency Circumstantial  Content Preoccupation  Delusions None reported or observed  Perception WDL  Hallucination None reported or observed  Judgment Poor  Confusion None  Danger to Self  Current suicidal ideation? Denies  Agreement Not to Harm Self Yes  Description of Agreement Verbal  Danger to Others  Danger to Others None reported or observed

## 2023-05-30 NOTE — Group Note (Signed)
 Date:  05/30/2023 Time:  9:21 PM  Group Topic/Focus:  Wrap-Up Group:   The focus of this group is to help patients review their daily goal of treatment and discuss progress on daily workbooks.    Participation Level:  Did Not Attend  Filomena Huge 05/30/2023, 9:21 PM

## 2023-05-30 NOTE — Plan of Care (Signed)

## 2023-05-30 NOTE — Progress Notes (Signed)
   05/30/23 0800  Psych Admission Type (Psych Patients Only)  Admission Status Involuntary  Psychosocial Assessment  Patient Complaints Worrying  Eye Contact Brief  Facial Expression Animated  Affect Preoccupied  Speech Logical/coherent  Interaction Assertive  Motor Activity Slow  Appearance/Hygiene Unremarkable  Behavior Characteristics Appropriate to situation  Mood Pleasant  Thought Process  Coherency Circumstantial  Content Preoccupation  Delusions None reported or observed  Perception WDL  Hallucination None reported or observed  Judgment Poor  Confusion None  Danger to Self  Current suicidal ideation? Denies  Agreement Not to Harm Self Yes  Description of Agreement verbal  Danger to Others  Danger to Others None reported or observed

## 2023-05-31 ENCOUNTER — Encounter (HOSPITAL_COMMUNITY): Payer: Self-pay

## 2023-05-31 DIAGNOSIS — F2089 Other schizophrenia: Secondary | ICD-10-CM | POA: Diagnosis not present

## 2023-05-31 NOTE — Progress Notes (Signed)
   05/31/23 0820  Psych Admission Type (Psych Patients Only)  Admission Status Involuntary  Psychosocial Assessment  Patient Complaints None  Eye Contact Fair  Facial Expression Animated  Affect Preoccupied  Speech Logical/coherent  Interaction Assertive  Motor Activity Other (Comment) (WDL)  Appearance/Hygiene Unremarkable  Behavior Characteristics Cooperative  Mood Anxious  Thought Process  Coherency Circumstantial  Content Preoccupation  Delusions None reported or observed  Perception WDL  Hallucination None reported or observed  Judgment Poor  Confusion None  Danger to Self  Current suicidal ideation? Denies  Agreement Not to Harm Self Yes  Description of Agreement verbal  Danger to Others  Danger to Others None reported or observed

## 2023-05-31 NOTE — Plan of Care (Signed)
 Pt is progressing but needs to work on increased adherence with behavior. Pt at times is labile and argumentative with staff

## 2023-05-31 NOTE — Group Note (Signed)
 LCSW Group Therapy Note   Group Date: 05/31/2023 Start Time: 1300 End Time: 1400     Participation:  patient was present for half of the group session.  Patient appeared to be tired, and at times his eyes were closed.  Patient didn't participate in the discussion.     Title: "Healing Flames: Navigating Anger with Compassion"   Objective:  Foster self-awareness and promote compassion toward oneself and others when dealing with anger.   Goals:  Help participants understand the underlying emotions and needs fueling anger. Provide coping strategies for healthier emotional expression and anger management.   Summary: This session explored anger as a volcano--an explosion driven by deeper feelings and unmet needs. Participants learned to identify anger triggers and underlying emotions, then practiced coping strategies like deep breathing, physical activity, and journaling. The group discussed healthy ways to manage anger before it escalates, using both personal reflection and shared experiences.   Therapeutic Modalities: Cognitive Behavioral Therapy (CBT): Challenging thoughts that fuel anger. Mindfulness: Increasing awareness of emotions and sensations.   Taylon Coole O Britteny Fiebelkorn, LCSWA 05/31/2023  2:34 PM

## 2023-05-31 NOTE — BH IP Treatment Plan (Signed)
 Interdisciplinary Treatment and Diagnostic Plan Update  05/31/2023 Time of Session: 12:35 PM - UPDATE [name] MRN: [id number]  Principal Diagnosis: Schizophrenia (HCC)  Secondary Diagnoses: Principal Problem:   Schizophrenia (HCC) Active Problems:   Schizoaffective disorder, bipolar type (HCC)   Current Medications:  Current Facility-Administered Medications  Medication Dose Route Frequency Provider Last Rate Last Admin   acetaminophen  (TYLENOL ) tablet 1,000 mg  1,000 mg Oral Q6H PRN Parker, Alvin S, MD   1,000 mg at 05/30/23 2159   alum & mag hydroxide-simeth (MAALOX/MYLANTA) 200-200-20 MG/5ML suspension 30 mL  30 mL Oral Q4H PRN Gilman Lade, NP       aspirin  tablet 325 mg  325 mg Oral Q6H PRN Parker, Alvin S, MD       benzocaine  (ORAJEL) 10 % mucosal gel   Mouth/Throat QID PRN Dorthea Gauze, NP   Given at 05/30/23 0850   cloZAPine  (CLOZARIL ) tablet 400 mg  400 mg Oral QHS Timmothy Foots, MD       And   [START ON 06/01/2023] cloZAPine  (CLOZARIL ) tablet 450 mg  450 mg Oral QHS Timmothy Foots, MD       cyanocobalamin  (VITAMIN B12) tablet 1,000 mcg  1,000 mcg Oral Daily Parker, Alvin S, MD   1,000 mcg at 05/31/23 0820   haloperidol  (HALDOL ) tablet 5 mg  5 mg Oral Q6H PRN Parker, Alvin S, MD   5 mg at 05/30/23 4782   And   LORazepam  (ATIVAN ) tablet 2 mg  2 mg Oral Q6H PRN Parker, Alvin S, MD   2 mg at 05/30/23 9562   And   diphenhydrAMINE  (BENADRYL ) capsule 50 mg  50 mg Oral Q6H PRN Parker, Alvin S, MD   50 mg at 05/30/23 1308   haloperidol  lactate (HALDOL ) injection 5 mg  5 mg Intramuscular TID PRN Gilman Lade, NP       And   diphenhydrAMINE  (BENADRYL ) injection 50 mg  50 mg Intramuscular TID PRN Gilman Lade, NP       And   LORazepam  (ATIVAN ) injection 2 mg  2 mg Intramuscular TID PRN Gilman Lade, NP       haloperidol  lactate (HALDOL ) injection 10 mg  10 mg Intramuscular TID PRN Gilman Lade, NP   10 mg at 05/27/23 0044   And    diphenhydrAMINE  (BENADRYL ) injection 50 mg  50 mg Intramuscular TID PRN Gilman Lade, NP   50 mg at 05/27/23 0044   And   LORazepam  (ATIVAN ) injection 2 mg  2 mg Intramuscular TID PRN Gilman Lade, NP   2 mg at 05/27/23 0043   folic acid  (FOLVITE ) tablet 1 mg  1 mg Oral Daily Parker, Alvin S, MD   1 mg at 05/31/23 0820   hydrOXYzine  (ATARAX ) tablet 50 mg  50 mg Oral TID PRN Parker, Alvin S, MD   50 mg at 05/30/23 2158   lithium  carbonate (LITHOBID ) ER tablet 1,200 mg  1,200 mg Oral QHS Parker, Alvin S, MD   1,200 mg at 05/30/23 2158   magnesium  hydroxide (MILK OF MAGNESIA) suspension 30 mL  30 mL Oral Daily PRN Gilman Lade, NP       senna (SENOKOT) tablet 17.2 mg  2 tablet Oral QHS Parker, Alvin S, MD   17.2 mg at 05/30/23 2158   PTA Medications: Medications Prior to Admission  Medication Sig Dispense Refill Last Dose/Taking   penicillin  v potassium (VEETID) 500 MG tablet Take 1 tablet (500 mg  total) by mouth 4 (four) times daily. (Patient not taking: Reported on 05/24/2023) 40 tablet 0     Patient Stressors:    Patient Strengths:    Treatment Modalities: Medication Management, Group therapy, Case management,  1 to 1 session with clinician, Psychoeducation, Recreational therapy.   Physician Treatment Plan for Primary Diagnosis: Schizophrenia (HCC) Long Term Goal(s):     Short Term Goals:    Medication Management: Evaluate patient's response, side effects, and tolerance of medication regimen.  Therapeutic Interventions: 1 to 1 sessions, Unit Group sessions and Medication administration.  Evaluation of Outcomes: Progressing  Physician Treatment Plan for Secondary Diagnosis: Principal Problem:   Schizophrenia (HCC) Active Problems:   Schizoaffective disorder, bipolar type (HCC)  Long Term Goal(s):     Short Term Goals:       Medication Management: Evaluate patient's response, side effects, and tolerance of medication regimen.  Therapeutic  Interventions: 1 to 1 sessions, Unit Group sessions and Medication administration.  Evaluation of Outcomes: Progressing   RN Treatment Plan for Primary Diagnosis: Schizophrenia (HCC) Long Term Goal(s): Knowledge of disease and therapeutic regimen to maintain health will improve  Short Term Goals: Ability to remain free from injury will improve, Ability to verbalize frustration and anger appropriately will improve, Ability to verbalize feelings will improve, and Ability to disclose and discuss suicidal ideas  Medication Management: RN will administer medications as ordered by provider, will assess and evaluate patient's response and provide education to patient for prescribed medication. RN will report any adverse and/or side effects to prescribing provider.  Therapeutic Interventions: 1 on 1 counseling sessions, Psychoeducation, Medication administration, Evaluate responses to treatment, Monitor vital signs and CBGs as ordered, Perform/monitor CIWA, COWS, AIMS and Fall Risk screenings as ordered, Perform wound care treatments as ordered.  Evaluation of Outcomes: Progressing   LCSW Treatment Plan for Primary Diagnosis: Schizophrenia (HCC) Long Term Goal(s): Safe transition to appropriate next level of care at discharge, Engage patient in therapeutic group addressing interpersonal concerns.  Short Term Goals: Engage patient in aftercare planning with referrals and resources, Increase ability to appropriately verbalize feelings, Facilitate acceptance of mental health diagnosis and concerns, and Identify triggers associated with mental health/substance abuse issues  Therapeutic Interventions: Assess for all discharge needs, 1 to 1 time with Social worker, Explore available resources and support systems, Assess for adequacy in community support network, Educate family and significant other(s) on suicide prevention, Complete Psychosocial Assessment, Interpersonal group therapy.  Evaluation of  Outcomes: Progressing   Progress in Treatment: Attending groups:  Attended some groups Participating in groups: No. Taking medication as prescribed: Yes. Toleration medication: Yes. Family/Significant other contact made: Yes, contacted [name] (mom) [phone]  Patient understands diagnosis: No Discussing patient identified problems/goals with staff: Yes Medical problems stabilized or resolved: Yes. Denies suicidal/homicidal ideation: Yes. Issues/concerns per patient self-inventory: No.   New problem(s) identified: No, Describe:  none   New Short Term/Long Term Goal(s): detox, medication management for mood stabilization; elimination of SI thoughts; development of comprehensive mental wellness/sobriety plan     Patient Goals:  "Discharge and work on my anger"   Discharge Plan or Barriers: Patient recently admitted. CSW will continue to follow and assess for appropriate referrals and possible discharge planning.      Reason for Continuation of Hospitalization: Depression Medication stabilization Suicidal ideation Withdrawal symptoms   Estimated Length of Stay:  4 - 5 days  Last 3 [place] Suicide Severity Risk Score: Flowsheet Row ED to Hosp[facility]Ad[facility] (Current[facility]20[facility] ED from [facility]/10/2023 in [facility]  C-SSRS RISK CATEGORY No Risk No Risk No Risk       Last PHQ 2/9 Scores:     No data to display          Scribe for Treatment Team: Tanav Orsak O Novalynn Branaman, LCSWA 05/31/2023 11:27 AM

## 2023-05-31 NOTE — Group Note (Signed)
 Date:  05/31/2023 Time:  1:05 PM  Group Topic/Focus:  Goals Group:   The focus of this group is to help patients establish daily goals to achieve during treatment and discuss how the patient can incorporate goal setting into their daily lives to aide in recovery.    Participation Level:  Did Not Attend   [name] 05/31/2023, 1:05 PM

## 2023-05-31 NOTE — [facility] Suicide Risk Assessment (Signed)
 [facility] INPATIENT:  Family/Significant Other Suicide Prevention Education  Suicide Prevention Education:  Education Completed; [name] (mom) [phone],  (name of family member/significant other) has been identified by the patient as the family member/significant other with whom the patient will be residing, and identified as the person(s) who will aid the patient in the event of a mental health crisis (suicidal ideations/suicide attempt).  With written consent from the patient, the family member/significant other has been provided the following suicide prevention education, prior to the and/or following the discharge of the patient.  Mom said that patient will return to her home located at [address], [place], [place] [address].    Mom said that they don't have any guns or weapons, and patient doesn't have access to guns or weapons.  Mom said that she will secure medications and sharp objects (such as knives and scissors).  Mom doesn't have any safety concerns about patient returning home.  Mom said that patient is doing well, "he sounds good."  "He is sweet, respectful and listens."   The suicide prevention education provided includes the following: Suicide risk factors Suicide prevention and interventions National Suicide Hotline telephone number [facility] assessment telephone number [facility] Emergency Assistance 911 [facility] and/or Residential Mobile Crisis Unit telephone number  Request made of family/significant other to: Remove weapons (e.g., guns, rifles, knives), all items previously/currently identified as safety concern.   Remove drugs/medications (over-the-counter, prescriptions, illicit drugs), all items previously/currently identified as a safety concern.  The family member/significant other verbalizes understanding of the suicide prevention education information provided.  The family member/significant other agrees to remove the items of safety  concern listed above.  Natha Guin O Quamir Willemsen, LCSWA 05/31/2023, 9:54 AM

## 2023-05-31 NOTE — Progress Notes (Signed)
 Patient woke up asking for food irritable yelling to Staff. Snack given Patient refused to go back in his room refused to wear a shirt, sitting in the hallway loud required three Staff to redirect him. Patient stating that "I am tired or all you Mother F... telling me what to do I will do what I want no one has a right to tell me shit" Patient later went back in his room. Support ongoing.

## 2023-05-31 NOTE — BH IP Treatment Plan (Signed)
 Interdisciplinary Treatment and Diagnostic Plan Update  05/31/2023 Time of Session: 12:35 PM - UPDATE [name] MRN: [id number]  Principal Diagnosis: Schizophrenia (HCC)  Secondary Diagnoses: Principal Problem:   Schizophrenia (HCC) Active Problems:   Schizoaffective disorder, bipolar type (HCC)   Current Medications:  Current Facility-Administered Medications  Medication Dose Route Frequency Provider Last Rate Last Admin   acetaminophen  (TYLENOL ) tablet 1,000 mg  1,000 mg Oral Q6H PRN Parker, Alvin S, MD   1,000 mg at 05/30/23 2159   alum & mag hydroxide-simeth (MAALOX/MYLANTA) 200-200-20 MG/5ML suspension 30 mL  30 mL Oral Q4H PRN Gilman Lade, NP       aspirin  tablet 325 mg  325 mg Oral Q6H PRN Parker, Alvin S, MD       benzocaine  (ORAJEL) 10 % mucosal gel   Mouth/Throat QID PRN Dorthea Gauze, NP   Given at 05/30/23 0850   cloZAPine  (CLOZARIL ) tablet 400 mg  400 mg Oral QHS Timmothy Foots, MD       And   [START ON 06/01/2023] cloZAPine  (CLOZARIL ) tablet 450 mg  450 mg Oral QHS Timmothy Foots, MD       cyanocobalamin  (VITAMIN B12) tablet 1,000 mcg  1,000 mcg Oral Daily Parker, Alvin S, MD   1,000 mcg at 05/31/23 0820   haloperidol  (HALDOL ) tablet 5 mg  5 mg Oral Q6H PRN Parker, Alvin S, MD   5 mg at 05/30/23 8657   And   LORazepam  (ATIVAN ) tablet 2 mg  2 mg Oral Q6H PRN Parker, Alvin S, MD   2 mg at 05/30/23 8469   And   diphenhydrAMINE  (BENADRYL ) capsule 50 mg  50 mg Oral Q6H PRN Parker, Alvin S, MD   50 mg at 05/30/23 6295   haloperidol  lactate (HALDOL ) injection 5 mg  5 mg Intramuscular TID PRN Gilman Lade, NP       And   diphenhydrAMINE  (BENADRYL ) injection 50 mg  50 mg Intramuscular TID PRN Gilman Lade, NP       And   LORazepam  (ATIVAN ) injection 2 mg  2 mg Intramuscular TID PRN Gilman Lade, NP       haloperidol  lactate (HALDOL ) injection 10 mg  10 mg Intramuscular TID PRN Gilman Lade, NP   10 mg at 05/27/23 0044   And    diphenhydrAMINE  (BENADRYL ) injection 50 mg  50 mg Intramuscular TID PRN Gilman Lade, NP   50 mg at 05/27/23 0044   And   LORazepam  (ATIVAN ) injection 2 mg  2 mg Intramuscular TID PRN Gilman Lade, NP   2 mg at 05/27/23 0043   folic acid  (FOLVITE ) tablet 1 mg  1 mg Oral Daily Parker, Alvin S, MD   1 mg at 05/31/23 0820   hydrOXYzine  (ATARAX ) tablet 50 mg  50 mg Oral TID PRN Parker, Alvin S, MD   50 mg at 05/30/23 2158   lithium  carbonate (LITHOBID ) ER tablet 1,200 mg  1,200 mg Oral QHS Parker, Alvin S, MD   1,200 mg at 05/30/23 2158   magnesium  hydroxide (MILK OF MAGNESIA) suspension 30 mL  30 mL Oral Daily PRN Gilman Lade, NP       senna (SENOKOT) tablet 17.2 mg  2 tablet Oral QHS Parker, Alvin S, MD   17.2 mg at 05/30/23 2158   PTA Medications: Medications Prior to Admission  Medication Sig Dispense Refill Last Dose/Taking   penicillin  v potassium (VEETID) 500 MG tablet Take 1 tablet (500 mg  total) by mouth 4 (four) times daily. (Patient not taking: Reported on 05/24/2023) 40 tablet 0     Patient Stressors:    Patient Strengths:    Treatment Modalities: Medication Management, Group therapy, Case management,  1 to 1 session with clinician, Psychoeducation, Recreational therapy.   Physician Treatment Plan for Primary Diagnosis: Schizophrenia (HCC) Long Term Goal(s):     Short Term Goals:    Medication Management: Evaluate patient's response, side effects, and tolerance of medication regimen.  Therapeutic Interventions: 1 to 1 sessions, Unit Group sessions and Medication administration.  Evaluation of Outcomes: Progressing  Physician Treatment Plan for Secondary Diagnosis: Principal Problem:   Schizophrenia (HCC) Active Problems:   Schizoaffective disorder, bipolar type (HCC)  Long Term Goal(s):     Short Term Goals:       Medication Management: Evaluate patient's response, side effects, and tolerance of medication regimen.  Therapeutic  Interventions: 1 to 1 sessions, Unit Group sessions and Medication administration.  Evaluation of Outcomes: Progressing   RN Treatment Plan for Primary Diagnosis: Schizophrenia (HCC) Long Term Goal(s): Knowledge of disease and therapeutic regimen to maintain health will improve  Short Term Goals: Ability to remain free from injury will improve, Ability to verbalize frustration and anger appropriately will improve, Ability to participate in decision making will improve, Ability to verbalize feelings will improve, Ability to identify and develop effective coping behaviors will improve, and Compliance with prescribed medications will improve  Medication Management: RN will administer medications as ordered by provider, will assess and evaluate patient's response and provide education to patient for prescribed medication. RN will report any adverse and/or side effects to prescribing provider.  Therapeutic Interventions: 1 on 1 counseling sessions, Psychoeducation, Medication administration, Evaluate responses to treatment, Monitor vital signs and CBGs as ordered, Perform/monitor CIWA, COWS, AIMS and Fall Risk screenings as ordered, Perform wound care treatments as ordered.  Evaluation of Outcomes: Progressing   LCSW Treatment Plan for Primary Diagnosis: Schizophrenia (HCC) Long Term Goal(s): Safe transition to appropriate next level of care at discharge, Engage patient in therapeutic group addressing interpersonal concerns.  Short Term Goals: Engage patient in aftercare planning with referrals and resources, Increase ability to appropriately verbalize feelings, Facilitate acceptance of mental health diagnosis and concerns, and Identify triggers associated with mental health/substance abuse issues  Therapeutic Interventions: Assess for all discharge needs, 1 to 1 time with Social worker, Explore available resources and support systems, Assess for adequacy in community support network, Educate family  and significant other(s) on suicide prevention, Complete Psychosocial Assessment, Interpersonal group therapy.  Evaluation of Outcomes: Progressing   Progress in Treatment: Attending groups: No. Participating in groups: No. Taking medication as prescribed: Yes. Toleration medication: Yes. Family/Significant other contact made: No, will contact:  consents pending Patient understands diagnosis: Yes. Discussing patient identified problems/goals with staff: No. Medical problems stabilized or resolved: Yes. Denies suicidal/homicidal ideation: Yes. Issues/concerns per patient self-inventory: No.   New problem(s) identified: No, Describe:  none   New Short Term/Long Term Goal(s): detox, medication management for mood stabilization; elimination of SI thoughts; development of comprehensive mental wellness/sobriety plan     Patient Goals:  "Discharge and work on my anger"   Discharge Plan or Barriers: Patient recently admitted. CSW will continue to follow and assess for appropriate referrals and possible discharge planning.      Reason for Continuation of Hospitalization: Depression Medication stabilization Suicidal ideation Withdrawal symptoms   Estimated Length of Stay: 5-7 days  Last 3 [place] Suicide Severity Risk Score: Flowsheet Row  ED to Hosp-Admission (Current[facility]2023 in [facility] at [facility] ED from 05/10/2023 in [facility]  C-SSRS RISK CATEGORY No Risk No Risk No Risk       Last PHQ 2/9 Scores:     No data to display          Scribe for Treatment Team: Octavion Mollenkopf O Mellanie Bejarano, LCSWA 05/31/2023 10:54 AM

## 2023-05-31 NOTE — Plan of Care (Signed)
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
## 2023-05-31 NOTE — Progress Notes (Signed)
 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  Background history of schizoaffective disorder and substance use disorder.  Presented involuntarily on account of aggressive and threatening behavior at home.  Intoxicated with cocaine at presentation.  I assumed care of the patient today.  Chart was reviewed today.  Patient discussed at multidisciplinary team meeting.  Nursing staff reports that patient is still intrusive and threatening towards staff.  He told staff that he would not go to jail if he hurts anybody as he has a mental illness.  There is a manipulative element to his current mental state.  He slept for 8.75 hours.  At interview with patient, he tells me that he feels good and he wants to be discharged.  He dismissed behavioral issues that where reported by staff.  States that he feels like people are poking him.  He does not feel persecuted by peers.  He is not endorsing hallucination in any form.  No current suicidal thoughts.  No current homicidal thoughts.  No current thoughts of violence.  No adverse effects from his medications.  Principal Problem: Schizophrenia (HCC) Diagnosis: Principal Problem:   Schizophrenia (HCC) Active Problems:   Schizoaffective disorder, bipolar type (HCC)  Total Time spent with patient: 20 minutes  Past Psychiatric History:  See H&P.  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 No pertinent surgical history. Family History: History reviewed. No pertinent family history. Family Psychiatric  History:  See H&P.  Social History:  Social History   Substance and Sexual Activity  Alcohol Use Not Currently     Social History   Substance and Sexual  Activity  Drug Use Yes   Types: Cocaine, Marijuana   Comment: crack 06-26-19    Social History   Socioeconomic History   Marital status: Single    Spouse name: Not on file   Number of children: Not on file   Years of education: Not on file   Highest education level: Not on file  Occupational History   Not on file  Tobacco Use   Smoking status: Every Day   Smokeless tobacco: Never  Substance and Sexual Activity   Alcohol use: Not Currently   Drug use: Yes    Types: Cocaine, Marijuana    Comment: crack 06-26-19   Sexual activity: Not on file  Other Topics Concern   Not on file  Social History Narrative   Not on file   Social Drivers of Health   Financial Resource Strain: Not on file  Food Insecurity: No Food Insecurity (05/25/2023)   Hunger Vital Sign    Worried About Running Out of Food in the Last Year: Never true    Ran Out of Food in the Last Year: Never true  Transportation Needs: No Transportation Needs (05/25/2023)   PRAPARE - Administrator, Civil Service (Medical): No    Lack of Transportation (Non-Medical): No  Physical Activity: Not on file  Stress: Not on file  Social Connections: Unknown (05/22/2021)   Received from [facility]   Social Network    Social Network: Not on file    Current Medications: Current Facility-Administered Medications  Medication Dose Route Frequency Provider Last Rate Last Admin  acetaminophen  (TYLENOL ) tablet 1,000 mg  1,000 mg Oral Q6H PRN Parker, Alvin S, MD   1,000 mg at 05/31/23 1619   alum & mag hydroxide-simeth (MAALOX/MYLANTA) 200-200-20 MG/5ML suspension 30 mL  30 mL Oral Q4H PRN Gilman Lade, NP       aspirin  tablet 325 mg  325 mg Oral Q6H PRN Parker, Alvin S, MD       benzocaine  (ORAJEL) 10 % mucosal gel   Mouth/Throat QID PRN Dorthea Gauze, NP   Given at 05/31/23 1827   cloZAPine  (CLOZARIL ) tablet 400 mg  400 mg Oral QHS Timmothy Foots, MD       And   [START ON 06/01/2023]  cloZAPine  (CLOZARIL ) tablet 450 mg  450 mg Oral QHS Parker, Alvin S, MD       cyanocobalamin  (VITAMIN B12) tablet 1,000 mcg  1,000 mcg Oral Daily Parker, Alvin S, MD   1,000 mcg at 05/31/23 0820   haloperidol  (HALDOL ) tablet 5 mg  5 mg Oral Q6H PRN Parker, Alvin S, MD   5 mg at 05/30/23 1610   And   LORazepam  (ATIVAN ) tablet 2 mg  2 mg Oral Q6H PRN Parker, Alvin S, MD   2 mg at 05/30/23 9604   And   diphenhydrAMINE  (BENADRYL ) capsule 50 mg  50 mg Oral Q6H PRN Parker, Alvin S, MD   50 mg at 05/30/23 5409   haloperidol  lactate (HALDOL ) injection 5 mg  5 mg Intramuscular TID PRN Gilman Lade, NP       And   diphenhydrAMINE  (BENADRYL ) injection 50 mg  50 mg Intramuscular TID PRN Gilman Lade, NP       And   LORazepam  (ATIVAN ) injection 2 mg  2 mg Intramuscular TID PRN Gilman Lade, NP       haloperidol  lactate (HALDOL ) injection 10 mg  10 mg Intramuscular TID PRN Gilman Lade, NP   10 mg at 05/27/23 0044   And   diphenhydrAMINE  (BENADRYL ) injection 50 mg  50 mg Intramuscular TID PRN Gilman Lade, NP   50 mg at 05/27/23 0044   And   LORazepam  (ATIVAN ) injection 2 mg  2 mg Intramuscular TID PRN Gilman Lade, NP   2 mg at 05/27/23 0043   folic acid  (FOLVITE ) tablet 1 mg  1 mg Oral Daily Parker, Alvin S, MD   1 mg at 05/31/23 0820   hydrOXYzine  (ATARAX ) tablet 50 mg  50 mg Oral TID PRN Parker, Alvin S, MD   50 mg at 05/30/23 2158   lithium  carbonate (LITHOBID ) ER tablet 1,200 mg  1,200 mg Oral QHS Parker, Alvin S, MD   1,200 mg at 05/30/23 2158   magnesium  hydroxide (MILK OF MAGNESIA) suspension 30 mL  30 mL Oral Daily PRN Gilman Lade, NP       senna (SENOKOT) tablet 17.2 mg  2 tablet Oral QHS Parker, Alvin S, MD   17.2 mg at 05/30/23 2158    Lab Results: No results found for this or any previous visit (from the past 48 hours).  Blood Alcohol level:  Lab Results  Component Value Date   [facility] <15 05/24/2023   ETH <15 05/10/2023     Metabolic Disorder Labs: No results found for: "HGBA1C", "MPG" No results found for: "PROLACTIN" Lab Results  Component Value Date   CHOL 121 11/30/2018   TRIG 102 11/30/2018   HDL 39 (L) 11/30/2018   CHOLHDL 3.1 11/30/2018   VLDL 20 11/30/2018  LDLCALC 62 11/30/2018    Physical Findings: AIMS:  , ,  ,  ,    CIWA:    COWS:     Musculoskeletal: Strength & Muscle Tone: within normal limits Gait & Station: normal Patient leans: N/A  Psychiatric Specialty Exam:  Presentation  General Appearance:  Casually dressed, sitting in build, not drooling, not in any distress.  Eye Contact: Good.  Speech: Spontaneous.  Slightly pressured.  Mood and Affect  Mood: Irritable.   Affect: Restricted and mood congruent.  Thought Process  Thought Processes: Linear and slightly circumstantial.  Descriptions of Associations:Intact  Orientation:Full (Time, Place and Person)  Thought Content: Future oriented.  No current suicidal thoughts.  No homicidal thoughts.  No thoughts of violence.  No negative ruminative flooding.  No guilty ruminations.  No delusional theme.  No obsessions.  Hallucinations: No hallucination in any modality.  Sensorium  Memory: Good.  Judgment: Fair.  Insight: Fair.  Executive Functions  Concentration: Limited.  Attention Span: Distractible.  Recall: Good.  Fund of Knowledge: Good.  Language: Good   Psychomotor Activity  Slightly increased psychomotor activity    Physical Exam: Physical Exam ROS Blood pressure 118/78, pulse (!) 102, temperature 98.2 F (36.8 C), temperature source Oral, resp. rate 16, height 5\' 11"  (1.803 m), weight 67.2 kg, SpO2 100%. Body mass index is 20.66 kg/m.   Treatment Plan Summary: Clozapine  is being titrated gradually.  He is scheduled to get a higher dose tonight.  Patient is still exhibiting impulsivity.  He is not stable for a lower level of care.  We will continue to titrate Clozapine   towards therapeutic dose.  We will obtain a trough lithium  level and clozapine  levels in a couple of days.  1.  Continue lithium  1200 mg at bedtime. 2.  Continue Clozapine  titration.  The patient will get 400 mg tonight. 3.  Continue to monitor for any adverse effects from clozapine . 4.  Weekly ANC. 5.  Continue to redirect patient. 6.  Unit restriction if patient remains intrusive. 7.  Continue to encourage unit groups and therapeutic activities. 8.  Continue to monitor mood behavior and interaction with others. 9.  Social worker will coordinate discharge and aftercare planning.  Amelie Jury, MD 05/31/2023, 8:36 PM

## 2023-05-31 NOTE — Progress Notes (Signed)
 Pt woke up around midnight throwing up. Pt would not say what triggered it. Pt did not required to be given anti emesis, did not continue throwing  up after that one episode, pt is asleep at this time, will continue to monitor.

## 2023-05-31 NOTE — Group Note (Signed)
 Recreation Therapy Group Note   Group Topic:Coping Skills  Group Date: 05/31/2023 Start Time: 1025 End Time: 1051 Facilitators: Ileen Kahre-McCall, LRT,CTRS Location: [address] Dayroom   Group Topic: Coping Skills   Goal Area(s) Addresses:  Patient will identify positive coping skill techniques. Patient will identify benefits of using coping skills post d/c.   Behavioral Response: N/A   Intervention: Worksheet, Group brain storming   Activity :  Mind Map.  Patient was provided a blank template of a diagram with 32 blank boxes in a tiered system, branching from the center (similar to a bubble chart). LRT directed patients to label the middle of the diagram "Coping Skills" and consider 8 different sources in which coping skills would be used. LRT and patients came up with the instances (stress, anger, finances, family, depression, self harm, relationships and addiction) in which coping skills would be used and recorded them in the 2nd tier boxes closest to the center. Patients were to then come up with 3 coping skills to address each identified area in the remaining boxes stemming from a particular situation. Patients were encouraged to share ideas as LRT wrote responses on the board.   Education:  Pharmacologist, Building control surveyor.    Education Outcome: Acknowledges Education   Affect/Mood: N/A   Participation Level: Did not attend    Clinical Observations/Individualized Feedback:     Plan: Continue to engage patient in RT group sessions 2-3x/week.   Henessy Rohrer-McCall, LRT,CTRS 05/31/2023 12:53 PM

## 2023-05-31 NOTE — Group Note (Signed)
 Date:  05/31/2023 Time:  9:17 PM  Group Topic/Focus:  Wrap-Up Group:   The focus of this group is to help patients review their daily goal of treatment and discuss progress on daily workbooks.    Participation Level:  Did Not Attend   [name] 05/31/2023, 9:17 PM

## 2023-06-01 DIAGNOSIS — F2089 Other schizophrenia: Secondary | ICD-10-CM | POA: Diagnosis not present

## 2023-06-01 LAB — CBC WITH DIFFERENTIAL/PLATELET
Abs Immature Granulocytes: 0.02 10*3/uL (ref 0.00–0.07)
Basophils Absolute: 0 10*3/uL (ref 0.0–0.1)
Basophils Relative: 0 %
Eosinophils Absolute: 0 10*3/uL (ref 0.0–0.5)
Eosinophils Relative: 1 %
HCT: 41.9 % (ref 39.0–52.0)
Hemoglobin: 13.7 g/dL (ref 13.0–17.0)
Immature Granulocytes: 0 %
Lymphocytes Relative: 13 %
Lymphs Abs: 0.9 10*3/uL (ref 0.7–4.0)
MCH: 29.4 pg (ref 26.0–34.0)
MCHC: 32.7 g/dL (ref 30.0–36.0)
MCV: 89.9 fL (ref 80.0–100.0)
Monocytes Absolute: 0.5 10*3/uL (ref 0.1–1.0)
Monocytes Relative: 7 %
Neutro Abs: 5.5 10*3/uL (ref 1.7–7.7)
Neutrophils Relative %: 79 %
Platelets: 218 10*3/uL (ref 150–400)
RBC: 4.66 MIL/uL (ref 4.22–5.81)
RDW: 13.5 % (ref 11.5–15.5)
WBC: 7 10*3/uL (ref 4.0–10.5)
nRBC: 0 % (ref 0.0–0.2)

## 2023-06-01 LAB — TROPONIN I (HIGH SENSITIVITY): Troponin I (High Sensitivity): 7 ng/L (ref <18)

## 2023-06-01 LAB — C-REACTIVE PROTEIN: CRP: 1.7 mg/dL — ABNORMAL HIGH (ref <1.0)

## 2023-06-01 NOTE — Progress Notes (Signed)
 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  Background history of schizoaffective disorder bipolar type and substance use disorder.  Presented involuntarily on account of aggressive and threatening behavior at home.  Intoxicated with cocaine at presentation.  Chart was reviewed today.  Patient discussed at multidisciplinary team meeting.  Nursing staff reports that he has been adherent with his medications.  He tolerated recent optimization in dose of Clozapine  without any side effects.  He has not required as needed psychotropic medication in the past 24 hours.  He slept for 6.5 hours.  No challenging behavior so far.  Seen today.  Patient tells me that he is ready for discharge.  Focused on how soon he is going to be home.  He is not reporting any side effects from his medications.  No drooling, no chest pain, no falls, no dizziness.   No overt delusions expressed.  He is dismissive of hallucination and no modality.  No current self-injurious thoughts.  No rageful thoughts towards others. Encouraged to keep ventilating his feelings to staff.   Principal Problem: Schizophrenia (HCC) Diagnosis: Principal Problem:   Schizophrenia (HCC) Active Problems:   Schizoaffective disorder, bipolar type (HCC)  Total Time spent with patient: 20 minutes  Past Psychiatric History:  See H&P.  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 No pertinent surgical history. Family History: History reviewed. No pertinent family history. Family Psychiatric  History:  See H&P.  Social History:  Social History   Substance and Sexual Activity  Alcohol Use Not Currently     Social History   Substance and  Sexual Activity  Drug Use Yes   Types: Cocaine, Marijuana   Comment: crack 06-26-19    Social History   Socioeconomic History   Marital status: Single    Spouse name: Not on file   Number of children: Not on file   Years of education: Not on file   Highest education level: Not on file  Occupational History   Not on file  Tobacco Use   Smoking status: Every Day   Smokeless tobacco: Never  Substance and Sexual Activity   Alcohol use: Not Currently   Drug use: Yes    Types: Cocaine, Marijuana    Comment: crack 06-26-19   Sexual activity: Not on file  Other Topics Concern   Not on file  Social History Narrative   Not on file   Social Drivers of Health   Financial Resource Strain: Not on file  Food Insecurity: No Food Insecurity (05/25/2023)   Hunger Vital Sign    Worried About Running Out of Food in the Last Year: Never true    Ran Out of Food in the Last Year: Never true  Transportation Needs: No Transportation Needs (05/25/2023)   PRAPARE - Administrator, Civil Service (Medical): No    Lack of Transportation (Non-Medical): No  Physical Activity: Not on file  Stress: Not on file  Social Connections: Unknown (05/22/2021)   Received from [facility]   Social Network    Social Network: Not on file    Current Medications: Current Facility-Administered Medications  Medication Dose Route Frequency Provider Last Rate Last Admin   acetaminophen  (TYLENOL ) tablet  1,000 mg  1,000 mg Oral Q6H PRN Parker, Alvin S, MD   1,000 mg at 05/31/23 1619   alum & mag hydroxide-simeth (MAALOX/MYLANTA) 200-200-20 MG/5ML suspension 30 mL  30 mL Oral Q4H PRN Gilman Lade, NP       aspirin  tablet 325 mg  325 mg Oral Q6H PRN Timmothy Foots, MD       benzocaine  (ORAJEL) 10 % mucosal gel   Mouth/Throat QID PRN Dorthea Gauze, NP   Given at 05/31/23 1827   cloZAPine  (CLOZARIL ) tablet 450 mg  450 mg Oral QHS Timmothy Foots, MD       cyanocobalamin  (VITAMIN B12)  tablet 1,000 mcg  1,000 mcg Oral Daily Parker, Alvin S, MD   1,000 mcg at 06/01/23 0925   haloperidol  (HALDOL ) tablet 5 mg  5 mg Oral Q6H PRN Parker, Alvin S, MD   5 mg at 05/30/23 1308   And   LORazepam  (ATIVAN ) tablet 2 mg  2 mg Oral Q6H PRN Parker, Alvin S, MD   2 mg at 05/30/23 6578   And   diphenhydrAMINE  (BENADRYL ) capsule 50 mg  50 mg Oral Q6H PRN Parker, Alvin S, MD   50 mg at 05/30/23 4696   haloperidol  lactate (HALDOL ) injection 5 mg  5 mg Intramuscular TID PRN Gilman Lade, NP       And   diphenhydrAMINE  (BENADRYL ) injection 50 mg  50 mg Intramuscular TID PRN Gilman Lade, NP       And   LORazepam  (ATIVAN ) injection 2 mg  2 mg Intramuscular TID PRN Gilman Lade, NP       haloperidol  lactate (HALDOL ) injection 10 mg  10 mg Intramuscular TID PRN Gilman Lade, NP   10 mg at 05/27/23 0044   And   diphenhydrAMINE  (BENADRYL ) injection 50 mg  50 mg Intramuscular TID PRN Gilman Lade, NP   50 mg at 05/27/23 0044   And   LORazepam  (ATIVAN ) injection 2 mg  2 mg Intramuscular TID PRN Gilman Lade, NP   2 mg at 05/27/23 2952   folic acid  (FOLVITE ) tablet 1 mg  1 mg Oral Daily Parker, Alvin S, MD   1 mg at 06/01/23 8413   hydrOXYzine  (ATARAX ) tablet 50 mg  50 mg Oral TID PRN Parker, Alvin S, MD   50 mg at 05/31/23 2129   lithium  carbonate (LITHOBID ) ER tablet 1,200 mg  1,200 mg Oral QHS Parker, Alvin S, MD   1,200 mg at 05/31/23 2128   magnesium  hydroxide (MILK OF MAGNESIA) suspension 30 mL  30 mL Oral Daily PRN Gilman Lade, NP       senna (SENOKOT) tablet 17.2 mg  2 tablet Oral QHS Timmothy Foots, MD   17.2 mg at 05/31/23 2129    Lab Results:  Results for orders placed or performed during the hospital encounter of 05/25/23 (from the past 48 hours)  CBC with Differential/Platelet     Status: None   Collection Time: 06/01/23  6:28 AM  Result Value Ref Range   WBC 7.0 4.0 - 10.5 K/uL   RBC 4.66 4.22 - 5.81 MIL/uL   Hemoglobin  13.7 13.0 - 17.0 g/dL   HCT 24.4 01.0 - 27.2 %   MCV 89.9 80.0 - 100.0 fL   MCH 29.4 26.0 - 34.0 pg   MCHC 32.7 30.0 - 36.0 g/dL   RDW 53.6 64.4 - 03.4 %   Platelets 218 150 - 400 K/uL  nRBC 0.0 0.0 - 0.2 %   Neutrophils Relative % 79 %   Neutro Abs 5.5 1.7 - 7.7 K/uL   Lymphocytes Relative 13 %   Lymphs Abs 0.9 0.7 - 4.0 K/uL   Monocytes Relative 7 %   Monocytes Absolute 0.5 0.1 - 1.0 K/uL   Eosinophils Relative 1 %   Eosinophils Absolute 0.0 0.0 - 0.5 K/uL   Basophils Relative 0 %   Basophils Absolute 0.0 0.0 - 0.1 K/uL   Immature Granulocytes 0 %   Abs Immature Granulocytes 0.02 0.00 - 0.07 K/uL    Comment: Performed at [facility], [address] [place], [place] [address]  Troponin I (High Sensitivity)     Status: None   Collection Time: 06/01/23  6:28 AM  Result Value Ref Range   Troponin I (High Sensitivity) 7 <18 ng/L    Comment: (NOTE) Elevated high sensitivity troponin I (hsTnI) values and significant  changes across serial measurements may suggest ACS but many other  chronic and acute conditions are known to elevate hsTnI results.  Refer to the "Links" section for chest pain algorithms and additional  guidance. Performed at [facility], [address] [place], [place] [address]     Blood Alcohol level:  Lab Results  Component Value Date   [facility] <15 05/24/2023   ETH <15 05/10/2023    Metabolic Disorder Labs: No results found for: "HGBA1C", "MPG" No results found for: "PROLACTIN" Lab Results  Component Value Date   CHOL 121 11/30/2018   TRIG 102 11/30/2018   HDL 39 (L) 11/30/2018   CHOLHDL 3.1 11/30/2018   VLDL 20 11/30/2018   LDLCALC 62 11/30/2018    Physical Findings: AIMS:  , ,  ,  ,    CIWA:    COWS:     Musculoskeletal: Strength & Muscle Tone: within normal limits Gait & Station: normal Patient leans: N/A  Psychiatric Specialty Exam:  Presentation  General Appearance:  Casually dressed, slim  build, not drooling, not in any distress.  No EPS.  Eye Contact: Good.  Speech: Spontaneous.  Normalizing rate, tone and volume.  Mood and Affect  Mood: Less irritable.   Affect: Restricted and mood congruent.  Thought Process  Thought Processes: Linear and slightly circumstantial.  Descriptions of Associations:Intact  Orientation:Full (Time, Place and Person)  Thought Content: Future oriented.  No current suicidal thoughts.  No homicidal thoughts.  No thoughts of violence.  No negative ruminative flooding.  No guilty ruminations.  No delusional theme.  No obsessions.  Hallucinations: No hallucination in any modality.  Sensorium  Memory: Good.  Judgment: Fair.  Insight: Fair.  Executive Functions  Concentration: Fair.  Attention Span: Less distractible.  Recall: Good.  Fund of Knowledge: Good.  Language: Good   Psychomotor Activity  Slightly increased psychomotor activity    Physical Exam: Physical Exam ROS Blood pressure 104/63, pulse (!) 124, temperature 98.6 F (37 C), temperature source Oral, resp. rate 16, height 5\' 11"  (1.803 m), weight 67.2 kg, SpO2 98%. Body mass index is 20.66 kg/m.   Treatment Plan Summary: Clozapine  is being titrated gradually.  He is scheduled to get a higher dose tonight.  He tolerated recent adjustments well.  No challenging behavior in the past 24 hours.  We will hopefully discharge him by weekend after obtaining his trough lithium  level.  1.  Continue lithium  1200 mg at bedtime. 2.  Continue Clozapine  titration.  The patient will get 450 mg tonight. 3.  Continue  to monitor for any adverse effects from clozapine . 4.  Weekly ANC. 5.  Continue to redirect patient. 6.  Unit restriction if patient remains intrusive. 7.  Continue to encourage unit groups and therapeutic activities. 8.  Continue to monitor mood behavior and interaction with others. 9.  Social worker will coordinate discharge and aftercare  planning.  Amelie Jury, MD 06/01/2023, 10:17 AM

## 2023-06-01 NOTE — Progress Notes (Signed)
 Irritable on initial assessment related to complain of being fatigued "I'm just tired, I slept good but I feel tired. I want to sleep and go home" lying on floor at medication window. Denies SI, HI, AVH and pain when assessed. Pt did not attend scheduled groups despite multiple prompts "I just want to go home. The doctor told me I'm leaving tomorrow. I'm fine". Orajel given for complain of toothache at 1132 with desired effect when reassessed at 1230. Tolerates medications, meals and fluids well. Safety checks maintained at Q 15 minutes intervals without outburst.

## 2023-06-01 NOTE — Plan of Care (Signed)
 Pt presents with preoccupied affect and pleasant mood. Denies SI, HI, and AVH. Cooperative in interactions with staff but sometimes requires redirection. Medication compliant with no adverse reactions. Pt did not attend evening group but was visible in the milieu before bed. Safety checks maintained at q 15 minutes.   Problem: Education: Goal: Emotional status will improve Outcome: Progressing Goal: Mental status will improve Outcome: Progressing Goal: Verbalization of understanding the information provided will improve Outcome: Progressing   Problem: Safety: Goal: Periods of time without injury will increase Outcome: Progressing

## 2023-06-01 NOTE — Progress Notes (Signed)
   06/01/23 2145  Psych Admission Type (Psych Patients Only)  Admission Status Involuntary  Psychosocial Assessment  Patient Complaints None  Eye Contact Fair  Facial Expression Animated  Affect Apprehensive  Speech Logical/coherent  Interaction Assertive;Childlike;Attention-seeking  Motor Activity Restless;Fidgety  Appearance/Hygiene Disheveled  Behavior Characteristics Cooperative  Mood Anxious;Preoccupied;Pleasant  Aggressive Behavior  Effect No apparent injury  Thought Process  Coherency WDL  Content Preoccupation  Delusions None reported or observed  Perception WDL  Hallucination None reported or observed  Judgment Limited  Confusion WDL  Danger to Self  Current suicidal ideation? Denies  Danger to Others  Danger to Others None reported or observed

## 2023-06-01 NOTE — Plan of Care (Signed)
   Problem: Education: Goal: Emotional status will improve Outcome: Progressing Goal: Mental status will improve Outcome: Progressing   Problem: Activity: Goal: Interest or engagement in activities will improve Outcome: Progressing Goal: Sleeping patterns will improve Outcome: Progressing

## 2023-06-01 NOTE — Plan of Care (Signed)
   Problem: Education: Goal: Emotional status will improve Outcome: Not Progressing Goal: Mental status will improve Outcome: Not Progressing

## 2023-06-01 NOTE — Group Note (Signed)
 Date:  06/01/2023 Time:  1:27 PM  Group Topic/Focus:  Goals Group:   The focus of this group is to help patients establish daily goals to achieve during treatment and discuss how the patient can incorporate goal setting into their daily lives to aide in recovery.    Participation Level:  Did Not Attend   [name] 06/01/2023, 1:27 PM

## 2023-06-01 NOTE — [facility] Group Notes (Signed)
 [facility] Group Notes:  (Nursing/MHT/Case Management/Adjunct)  Date:  06/01/2023  Time:  8:39 PM  Type of Therapy:  Group Therapy  Participation Level: Did not attend  Participation Quality:  Did not attend  Affect:  did not attend  Cognitive:  Lacking  Insight:  None  Engagement in Group:  None  Modes of Intervention:  Discussion  Summary of Progress/Problems: Pt did not attend group  Anniece Kind 06/01/2023, 8:39 PM

## 2023-06-01 NOTE — Progress Notes (Signed)
 Pt came out of his room and was complaining that he did not feel well. Pt proceeded to lay down on the floor in the hallway and was redirected back to his room. Pt remains safe on the unit and appears to be in no distress at this time. Will continue to assess.

## 2023-06-02 ENCOUNTER — Encounter (HOSPITAL_COMMUNITY): Payer: Self-pay | Admitting: Psychiatry

## 2023-06-02 DIAGNOSIS — F2089 Other schizophrenia: Secondary | ICD-10-CM | POA: Diagnosis not present

## 2023-06-02 MED ORDER — CLOZAPINE 100 MG PO TABS
500.0000 mg | ORAL_TABLET | Freq: Every day | ORAL | Status: DC
  Administered 2023-06-02 – 2023-06-04 (×3): 500 mg via ORAL
  Filled 2023-06-02 (×3): qty 5

## 2023-06-02 NOTE — Progress Notes (Signed)
 [facility] MD Progress Note  06/02/2023 10:53 AM [name]  MRN:  [id number] Subjective:   33 year old African-American male, single, employed in landscaping, lives with his family.  Background history of schizoaffective disorder bipolar type and substance use disorder.  Presented involuntarily on account of aggressive and threatening behavior at home.  Intoxicated with cocaine at presentation.  Chart was reviewed today.  Patient discussed at multidisciplinary team meeting.  Nursing staff reports that patient slept for 8.25 hours.  He has been very focused on discharge.  No challenging behavior until this afternoon.  Seen today.  Patient was sleeping earlier this morning.  At interview he was very focused on discharge.  Plan to get his trough lithium  level by weekend before discharge was discussed with him.  Patient became irritable and walked away.  He went to the hallway and became dramatic.  He felt like acting out we will get him out of the hospital sooner.  He was de-escalated verbally and agreeable to as needed.   Principal Problem: Schizophrenia (HCC) Diagnosis: Principal Problem:   Schizophrenia (HCC) Active Problems:   Schizoaffective disorder, bipolar type (HCC)  Total Time spent with patient: 20 minutes  Past Psychiatric History:  See H&P.  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 No pertinent surgical history. Family History: History reviewed. No pertinent family history. Family Psychiatric  History:  See H&P.  Social History:  Social History   Substance and Sexual Activity  Alcohol Use Not Currently     Social History   Substance and Sexual Activity  Drug Use Yes   Types: Cocaine, Marijuana   Comment: crack 06-26-19    Social History   Socioeconomic History   Marital status: Single    Spouse  name: Not on file   Number of children: Not on file   Years of education: Not on file   Highest education level: Not on file  Occupational History   Not on file  Tobacco Use   Smoking status: Every Day   Smokeless tobacco: Never  Substance and Sexual Activity   Alcohol use: Not Currently   Drug use: Yes    Types: Cocaine, Marijuana    Comment: crack 06-26-19   Sexual activity: Not on file  Other Topics Concern   Not on file  Social History Narrative   Not on file   Social Drivers of Health   Financial Resource Strain: Not on file  Food Insecurity: No Food Insecurity (05/25/2023)   Hunger Vital Sign    Worried About Running Out of Food in the Last Year: Never true    Ran Out of Food in the Last Year: Never true  Transportation Needs: No Transportation Needs (05/25/2023)   PRAPARE - Administrator, Civil Service (Medical): No    Lack of Transportation (Non-Medical): No  Physical Activity: Not on file  Stress: Not on file  Social Connections: Unknown (05/22/2021)   Received from [facility]   Social Network    Social Network: Not on file    Current Medications: Current Facility-Administered Medications  Medication Dose Route Frequency Provider Last Rate Last Admin   acetaminophen  (TYLENOL ) tablet 1,000 mg  1,000 mg Oral Q6H PRN Parker, Alvin S, MD   1,000 mg at 06/01/23 2027   alum & mag hydroxide-simeth (MAALOX/MYLANTA) 200-200-20 MG/5ML suspension  30 mL  30 mL Oral Q4H PRN Byungura, Veronique M, NP       aspirin  tablet 325 mg  325 mg Oral Q6H PRN Parker, Alvin S, MD       benzocaine  (ORAJEL) 10 % mucosal gel   Mouth/Throat QID PRN Dorthea Gauze, NP   1 Application at 06/01/23 1132   cloZAPine  (CLOZARIL ) tablet 500 mg  500 mg Oral QHS Calina Patrie, Iline Mallory, MD       cyanocobalamin  (VITAMIN B12) tablet 1,000 mcg  1,000 mcg Oral Daily Parker, Alvin S, MD   1,000 mcg at 06/02/23 1610   haloperidol  (HALDOL ) tablet 5 mg  5 mg Oral Q6H PRN Parker, Alvin  S, MD   5 mg at 05/30/23 9604   And   LORazepam  (ATIVAN ) tablet 2 mg  2 mg Oral Q6H PRN Parker, Alvin S, MD   2 mg at 05/30/23 5409   And   diphenhydrAMINE  (BENADRYL ) capsule 50 mg  50 mg Oral Q6H PRN Parker, Alvin S, MD   50 mg at 05/30/23 8119   haloperidol  lactate (HALDOL ) injection 5 mg  5 mg Intramuscular TID PRN Gilman Lade, NP       And   diphenhydrAMINE  (BENADRYL ) injection 50 mg  50 mg Intramuscular TID PRN Gilman Lade, NP       And   LORazepam  (ATIVAN ) injection 2 mg  2 mg Intramuscular TID PRN Gilman Lade, NP       haloperidol  lactate (HALDOL ) injection 10 mg  10 mg Intramuscular TID PRN Gilman Lade, NP   10 mg at 05/27/23 0044   And   diphenhydrAMINE  (BENADRYL ) injection 50 mg  50 mg Intramuscular TID PRN Gilman Lade, NP   50 mg at 05/27/23 0044   And   LORazepam  (ATIVAN ) injection 2 mg  2 mg Intramuscular TID PRN Gilman Lade, NP   2 mg at 05/27/23 0043   folic acid  (FOLVITE ) tablet 1 mg  1 mg Oral Daily Parker, Alvin S, MD   1 mg at 06/02/23 1478   hydrOXYzine  (ATARAX ) tablet 50 mg  50 mg Oral TID PRN Parker, Alvin S, MD   50 mg at 06/01/23 2028   lithium  carbonate (LITHOBID ) ER tablet 1,200 mg  1,200 mg Oral QHS Parker, Alvin S, MD   1,200 mg at 06/01/23 2029   magnesium  hydroxide (MILK OF MAGNESIA) suspension 30 mL  30 mL Oral Daily PRN Gilman Lade, NP       senna (SENOKOT) tablet 17.2 mg  2 tablet Oral QHS Timmothy Foots, MD   17.2 mg at 06/01/23 2030    Lab Results:  Results for orders placed or performed during the hospital encounter of 05/25/23 (from the past 48 hours)  CBC with Differential/Platelet     Status: None   Collection Time: 06/01/23  6:28 AM  Result Value Ref Range   WBC 7.0 4.0 - 10.5 K/uL   RBC 4.66 4.22 - 5.81 MIL/uL   Hemoglobin 13.7 13.0 - 17.0 g/dL   HCT 29.5 62.1 - 30.8 %   MCV 89.9 80.0 - 100.0 fL   MCH 29.4 26.0 - 34.0 pg   MCHC 32.7 30.0 - 36.0 g/dL   RDW 65.7 84.6 - 96.2  %   Platelets 218 150 - 400 K/uL   nRBC 0.0 0.0 - 0.2 %   Neutrophils Relative % 79 %   Neutro Abs 5.5 1.7 - 7.7 K/uL   Lymphocytes Relative 13 %  Lymphs Abs 0.9 0.7 - 4.0 K/uL   Monocytes Relative 7 %   Monocytes Absolute 0.5 0.1 - 1.0 K/uL   Eosinophils Relative 1 %   Eosinophils Absolute 0.0 0.0 - 0.5 K/uL   Basophils Relative 0 %   Basophils Absolute 0.0 0.0 - 0.1 K/uL   Immature Granulocytes 0 %   Abs Immature Granulocytes 0.02 0.00 - 0.07 K/uL    Comment: Performed at [facility], [address], [place], [place] [address]  C-reactive protein     Status: Abnormal   Collection Time: 06/01/23  6:28 AM  Result Value Ref Range   CRP 1.7 (H) <1.0 mg/dL    Comment: Performed at [facility], [address] [place], [place] [address]  Troponin I (High Sensitivity)     Status: None   Collection Time: 06/01/23  6:28 AM  Result Value Ref Range   Troponin I (High Sensitivity) 7 <18 ng/L    Comment: (NOTE) Elevated high sensitivity troponin I (hsTnI) values and significant  changes across serial measurements may suggest ACS but many other  chronic and acute conditions are known to elevate hsTnI results.  Refer to the "Links" section for chest pain algorithms and additional  guidance. Performed at [facility], [address] [place], [place] [address]     Blood Alcohol level:  Lab Results  Component Value Date   [facility] <15 05/24/2023   ETH <15 05/10/2023    Metabolic Disorder Labs: No results found for: "HGBA1C", "MPG" No results found for: "PROLACTIN" Lab Results  Component Value Date   CHOL 121 11/30/2018   TRIG 102 11/30/2018   HDL 39 (L) 11/30/2018   CHOLHDL 3.1 11/30/2018   VLDL 20 11/30/2018   LDLCALC 62 11/30/2018    Physical Findings: AIMS:  , ,  ,  ,    CIWA:    COWS:     Musculoskeletal: Strength & Muscle Tone: within normal limits Gait & Station: normal Patient leans: N/A  Psychiatric Specialty  Exam:  Presentation  General Appearance:  Casually dressed, slim build, not drooling, not in any distress.  No EPS.  Eye Contact: Good.  Speech: Spontaneous.  Normal prosody of speech.  Mood and Affect  Mood: Residual irritability   Affect: Restricted and mood congruent.  Thought Process  Thought Processes: Linear and slightly circumstantial.  Descriptions of Associations:Intact  Orientation:Full (Time, Place and Person)  Thought Content: Future oriented.  No current suicidal thoughts.  No homicidal thoughts.  No thoughts of violence.  No negative ruminative flooding.  No guilty ruminations.  No delusional theme.  No obsessions.  Hallucinations: No hallucination in any modality.  Sensorium  Memory: Good.  Judgment: Fair.  Insight: Fair.  Executive Functions  Concentration: Fair.  Attention Span: Less distractible.  Recall: Good.  Fund of Knowledge: Good.  Language: Good   Psychomotor Activity  Slightly increased psychomotor activity    Physical Exam: Physical Exam ROS Blood pressure 95/63, pulse (!) 123, temperature 98.8 F (37.1 C), temperature source Oral, resp. rate 16, height 5\' 11"  (1.803 m), weight 67.2 kg, SpO2 100%. Body mass index is 20.66 kg/m.   Treatment Plan Summary: Patient is tolerating Clozapine  well.  He still has residual impulsivity and irritability.  We will optimize clozapine  to 500 mg tonight.  We will keep his other medicines the same and evaluate him further.  Lithium  trough level on Saturday morning.  1.  Continue lithium  1200 mg at bedtime. 2.  Clozapine   500 mg at bedtime.   3.  Continue to monitor for any adverse effects from clozapine . 4.  Weekly ANC. 5.  Continue to redirect patient. 6.  Unit restriction if patient remains intrusive. 7.  Continue to encourage unit groups and therapeutic activities. 8.  Continue to monitor mood behavior and interaction with others. 9.  Social worker will coordinate  discharge and aftercare planning.  Amelie Jury, MD 06/02/2023, 10:53 AM

## 2023-06-02 NOTE — Progress Notes (Signed)
   06/02/23 2000  Psych Admission Type (Psych Patients Only)  Admission Status Involuntary  Psychosocial Assessment  Patient Complaints Other (Comment) (pt stated he was ready to go home)  Eye Contact Fair  Facial Expression Animated  Affect Apprehensive  Speech Logical/coherent  Interaction Assertive;Childlike;Attention-seeking  Motor Activity Restless;Fidgety  Appearance/Hygiene Disheveled  Behavior Characteristics Cooperative  Mood Labile  Aggressive Behavior  Effect No apparent injury  Thought Process  Coherency WDL  Content Preoccupation  Delusions None reported or observed  Perception WDL  Hallucination None reported or observed  Judgment Limited  Confusion WDL  Danger to Self  Current suicidal ideation? Denies  Danger to Others  Danger to Others None reported or observed

## 2023-06-02 NOTE — Group Note (Signed)
 Date:  06/02/2023 Time:  1:17 PM  Group Topic/Focus:  Goals Group:   The focus of this group is to help patients establish daily goals to achieve during treatment and discuss how the patient can incorporate goal setting into their daily lives to aide in recovery. Orientation:   The focus of this group is to educate the patient on the purpose and policies of crisis stabilization and provide a format to answer questions about their admission.  The group details unit policies and expectations of patients while admitted.    Participation Level:  Did Not Attend   [name] 06/02/2023, 1:17 PM

## 2023-06-02 NOTE — Progress Notes (Signed)
 Pt has been sleep much of the evening, pt will awaken when called, but has hard time staying awake . Pt too lethargic to take medication at this time, will re-evaluate later.

## 2023-06-02 NOTE — Group Note (Signed)
 Recreation Therapy Group Note   Group Topic:Communication  Group Date: 06/02/2023 Start Time: 1017 End Time: 1035 Facilitators: Letha Mirabal-McCall, LRT,CTRS Location: [address] Dayroom   Group Topic: Communication, Team Building, Problem Solving  Goal Area(s) Addresses:  Patient will effectively work with peer towards shared goal.  Patient will identify skills used to make activity successful.  Patient will identify how skills used during activity can be applied to reach post d/c goals.   Behavioral Response: N/A  Intervention: STEM Activity- Glass blower/designer  Activity: Tallest [employer]. In teams of 5-6, patients were given 11 craft pipe cleaners. Using the materials provided, patients were instructed to compete again the opposing team(s) to build the tallest free-standing structure from floor level. The activity was timed; difficulty increased by Clinical research associate as Production designer, theatre/television/film continued.  Systematically resources were removed with additional directions for example, placing one arm behind their back, working in silence, and shape stipulations. LRT facilitated post-activity discussion reviewing team processes and necessary communication skills involved in completion. Patients were encouraged to reflect how the skills utilized, or not utilized, in this activity can be incorporated to positively impact support systems post discharge.  Education: Pharmacist, community, Scientist, physiological, Discharge Planning   Education Outcome: Acknowledges education/In group clarification offered/Needs additional education.    Affect/Mood: N/A   Participation Level: Did not attend    Clinical Observations/Individualized Feedback:     Plan: Continue to engage patient in RT group sessions 2-3x/week.   [name], LRT,CTRS 06/02/2023 1:51 PM

## 2023-06-02 NOTE — Progress Notes (Signed)
 Pt had a verbal outburst this shift related to not being d/c today. Pt became verbally hostile, threatening towards provider "Fuck you up mother fucker, you bitch ass. Wait till I call my mama. You said you were going to let me go and now you're keeping me here. Bring the damn chair out out then". Pt's behavior escalated to being physical and he hit his head on glass portion of dayroom. PRN agitation protocol administered (Ativan , Benadryl  and Haldol -see EMAR) at approximately 1051 all IM. Pt's head assessed, no bleeding or nodule noted. Pt declined vitals on multiple attempts "No, just leave me alone right now". Pt asleep when reassessed at 1150. Support, encouragement and reassurance offered. Safety checks maintained at Q 15 minutes intervals without further events.

## 2023-06-02 NOTE — Plan of Care (Signed)
°  Problem: Education: °Goal: Emotional status will improve °Outcome: Progressing °  °Problem: Activity: °Goal: Sleeping patterns will improve °Outcome: Progressing °  °Problem: Safety: °Goal: Periods of time without injury will increase °Outcome: Progressing °  °

## 2023-06-02 NOTE — Plan of Care (Signed)
  Problem: Safety: Goal: Ability to redirect hostility and anger into socially appropriate behaviors will improve Outcome: Progressing   Problem: Self-Concept: Goal: Level of anxiety will decrease Outcome: Not Progressing   Problem: Coping: Goal: Ability to verbalize frustrations and anger appropriately will improve Outcome: Not Progressing

## 2023-06-02 NOTE — Progress Notes (Signed)
 Pt was at the medication window taking his meds and stated he felt like his heart was racing. He also reported feeling some dizziness. Pt was educated to sit down. Vitals were taken. Pt given oral fluids to drink and was encouraged to keep drinking fluids through out the night. After sitting for two minutes, patient stated his symptoms resolved. He was able to walk with steady gait towards his room. Patient was reeducated on fall prevention safety. He was made aware if symptoms return, to dangle in bed and make a staff aware during safety checks.    06/01/23 2032  Vital Signs  Pulse Rate (!) 117  Pulse Rate Source Monitor  BP 103/78  BP Location Right Arm  BP Method Automatic  Patient Position (if appropriate) Sitting  Oxygen Therapy  SpO2 99 %

## 2023-06-02 NOTE — Group Note (Signed)
 Date:  06/02/2023 Time:  8:53 PM  Group Topic/Focus:  Wrap-Up Group:   The focus of this group is to help patients review their daily goal of treatment and discuss progress on daily workbooks.    Participation Level:  Did Not Attend   Castiel Lauricella Dacosta 06/02/2023, 8:53 PM

## 2023-06-02 NOTE — Progress Notes (Signed)
 Pt got up and was not as lethargic , pt took his HS medications without issue

## 2023-06-03 DIAGNOSIS — F2089 Other schizophrenia: Secondary | ICD-10-CM | POA: Diagnosis not present

## 2023-06-03 LAB — CLOZAPINE (CLOZARIL)
Clozapine Lvl: 660 ng/mL — ABNORMAL HIGH (ref 350–600)
NorClozapine: 121 ng/mL
Total(Cloz+Norcloz): 781 ng/mL

## 2023-06-03 NOTE — Group Note (Addendum)
 Date:  06/03/2023 Time:  11:05 PM  Group Topic/Focus:  Wrap-Up Group:   The focus of this group is to help patients review their daily goal of treatment and discuss progress on daily workbooks.    Additional Comments:  Pt was encouraged, but opted out of attending wrap up group this evening.   Eligah Grow 06/03/2023, 11:05 PM

## 2023-06-03 NOTE — Progress Notes (Signed)
   06/03/23 0918  Psych Admission Type (Psych Patients Only)  Admission Status Involuntary  Psychosocial Assessment  Patient Complaints Sleep disturbance  Eye Contact Brief  Facial Expression Animated  Affect Apprehensive  Speech Logical/coherent  Interaction Assertive  Motor Activity Other (Comment) (WDL)  Appearance/Hygiene Disheveled  Behavior Characteristics Cooperative  Mood Labile  Thought Process  Coherency WDL  Content WDL  Delusions None reported or observed  Perception WDL  Hallucination None reported or observed  Judgment Limited  Confusion None  Danger to Self  Current suicidal ideation? Denies  Agreement Not to Harm Self Yes  Description of Agreement verbal  Danger to Others  Danger to Others None reported or observed

## 2023-06-03 NOTE — Progress Notes (Signed)
   06/03/23 2015  Psych Admission Type (Psych Patients Only)  Admission Status Involuntary  Psychosocial Assessment  Patient Complaints Worrying  Eye Contact Fair  Facial Expression Animated  Affect Apprehensive  Speech Logical/coherent  Interaction Assertive;Childlike;Attention-seeking  Motor Activity Restless;Fidgety  Appearance/Hygiene Disheveled  Behavior Characteristics Cooperative;Anxious  Mood Preoccupied;Suspicious  Aggressive Behavior  Effect No apparent injury  Thought Process  Coherency WDL  Content Preoccupation  Delusions None reported or observed  Perception WDL  Hallucination None reported or observed  Judgment Limited  Confusion WDL  Danger to Self  Current suicidal ideation? Denies  Danger to Others  Danger to Others None reported or observed

## 2023-06-03 NOTE — Plan of Care (Signed)
   Problem: Education: Goal: Emotional status will improve Outcome: Progressing Goal: Mental status will improve Outcome: Progressing   Problem: Activity: Goal: Interest or engagement in activities will improve Outcome: Progressing Goal: Sleeping patterns will improve Outcome: Progressing

## 2023-06-03 NOTE — Group Note (Signed)
 Recreation Therapy Group Note   Group Topic:Stress Management  Group Date: 06/03/2023 Start Time: 0981 End Time: 1020 Facilitators: Dallan Schonberg-McCall, LRT,CTRS Location: [address] Dayroom   Group Topic: Stress Management  Goal Area(s) Addresses:  Patient will identify positive stress management techniques. Patient will identify benefits of using stress management post d/c.  Behavioral Response:   Intervention: Meditation App  Activity: LRT and patient discussed how uncomfortable situations can affect willingness to engage and outlook on things. LRT played a meditation that focused on confidence and how to push through to build up the belief in ones self that they can overcome instances where they may not be as confident.     Education:  Stress Management, Discharge Planning.   Education Outcome: Acknowledges Education   Affect/Mood: N/A   Participation Level: Did not attend    Clinical Observations/Individualized Feedback:    Plan: Continue to engage patient in RT group sessions 2-3x/week.   [name], LRT,CTRS 06/03/2023 12:10 PM

## 2023-06-03 NOTE — Progress Notes (Signed)
 Talked to pt about why he has to stay longer and pt was ok, pt stated he got mad because he was expecting to leave because he said the doctor told him he would be leaving.

## 2023-06-03 NOTE — Care Management Important Message (Signed)
 Patient informed of right to appeal discharge, provided phone number to [employer]. Patient expressed no interest in appealing discharge at this time. CSW will continue to monitor situation.   Blondine Hottel, LCSWA 06/03/2023

## 2023-06-03 NOTE — Progress Notes (Signed)
 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  Background history of schizoaffective disorder bipolar type and substance use disorder.  Presented involuntarily on account of aggressive and threatening behavior at home.  Intoxicated with cocaine at presentation.  Chart was reviewed today.  Patient discussed at multidisciplinary team meeting.  Nursing staff reports that he did not have another outburst after the incident yesterday.  He has been adherent with his medication.  No observable side effects with recent optimization.  Vital signs have remained stable.  Seen today.  Patient tolerated recent optimization in dose of clozapine .  No drooling.  No sore throat.  No dizziness or falls.  He tells me that he slept well.  He apologizes about his behavior yesterday.  States that he feels good and wants to know when he will be living.  He is aware that we would get his trough lithium  level on Saturday.  I reassured him that we will take it 1 day at a time.  Patient was very dismissive when probed about psychotic and mood symptoms.  He tells me everything is all right and he wants to leave.  States that he has been in communication with his mother.  He is not endorsing any violent thoughts.   Principal Problem: Schizophrenia (HCC) Diagnosis: Principal Problem:   Schizophrenia (HCC) Active Problems:   Schizoaffective disorder, bipolar type (HCC)  Total Time spent with patient: 20 minutes  Past Psychiatric History:  See H&P.  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 No pertinent surgical history. Family History: History reviewed. No pertinent family history. Family Psychiatric  History:  See  H&P.  Social History:  Social History   Substance and Sexual Activity  Alcohol Use Not Currently     Social History   Substance and Sexual Activity  Drug Use Yes   Types: Cocaine, Marijuana   Comment: crack 06-26-19    Social History   Socioeconomic History   Marital status: Single    Spouse name: Not on file   Number of children: Not on file   Years of education: Not on file   Highest education level: Not on file  Occupational History   Not on file  Tobacco Use   Smoking status: Every Day   Smokeless tobacco: Never  Substance and Sexual Activity   Alcohol use: Not Currently   Drug use: Yes    Types: Cocaine, Marijuana    Comment: crack 06-26-19   Sexual activity: Not on file  Other Topics Concern   Not on file  Social History Narrative   Not on file   Social Drivers of Health   Financial Resource Strain: Not on file  Food Insecurity: No Food Insecurity (05/25/2023)   Hunger Vital Sign    Worried About Running Out of Food in the Last Year: Never true    Ran Out of Food in the Last Year: Never true  Transportation Needs: No Transportation Needs (05/25/2023)   PRAPARE - Administrator, Civil Service (Medical): No    Lack of Transportation (Non-Medical): No  Physical Activity: Not on file  Stress: Not on file  Social Connections: Unknown (05/22/2021)   Received from [facility], [facility]  Social Network    Social Network: Not on file    Current Medications: Current Facility-Administered Medications  Medication Dose Route Frequency Provider Last Rate Last Admin   acetaminophen  (TYLENOL ) tablet 1,000 mg  1,000 mg Oral Q6H PRN Parker, Alvin S, MD   1,000 mg at 06/01/23 2027   alum & mag hydroxide-simeth (MAALOX/MYLANTA) 200-200-20 MG/5ML suspension 30 mL  30 mL Oral Q4H PRN Gilman Lade, NP       aspirin  tablet 325 mg  325 mg Oral Q6H PRN Parker, Alvin S, MD       benzocaine  (ORAJEL) 10 % mucosal gel   Mouth/Throat QID PRN Dorthea Gauze, NP   1 Application at 06/01/23 1132   cloZAPine  (CLOZARIL ) tablet 500 mg  500 mg Oral QHS Maridel Pixler, Iline Mallory, MD   500 mg at 06/02/23 2333   cyanocobalamin  (VITAMIN B12) tablet 1,000 mcg  1,000 mcg Oral Daily Parker, Alvin S, MD   1,000 mcg at 06/02/23 4098   haloperidol  (HALDOL ) tablet 5 mg  5 mg Oral Q6H PRN Parker, Alvin S, MD   5 mg at 05/30/23 1191   And   LORazepam  (ATIVAN ) tablet 2 mg  2 mg Oral Q6H PRN Parker, Alvin S, MD   2 mg at 05/30/23 4782   And   diphenhydrAMINE  (BENADRYL ) capsule 50 mg  50 mg Oral Q6H PRN Parker, Alvin S, MD   50 mg at 05/30/23 9562   haloperidol  lactate (HALDOL ) injection 5 mg  5 mg Intramuscular TID PRN Gilman Lade, NP       And   diphenhydrAMINE  (BENADRYL ) injection 50 mg  50 mg Intramuscular TID PRN Gilman Lade, NP       And   LORazepam  (ATIVAN ) injection 2 mg  2 mg Intramuscular TID PRN Gilman Lade, NP       haloperidol  lactate (HALDOL ) injection 10 mg  10 mg Intramuscular TID PRN Gilman Lade, NP   10 mg at 06/02/23 1051   And   diphenhydrAMINE  (BENADRYL ) injection 50 mg  50 mg Intramuscular TID PRN Gilman Lade, NP   50 mg at 06/02/23 1051   And   LORazepam  (ATIVAN ) injection 2 mg  2 mg Intramuscular TID PRN Gilman Lade, NP   2 mg at 06/02/23 1051   folic acid  (FOLVITE ) tablet 1 mg  1 mg Oral Daily Parker, Alvin S, MD   1 mg at 06/02/23 1308   hydrOXYzine  (ATARAX ) tablet 50 mg  50 mg Oral TID PRN Parker, Alvin S, MD   50 mg at 06/01/23 2028   lithium  carbonate (LITHOBID ) ER tablet 1,200 mg  1,200 mg Oral QHS Parker, Alvin S, MD   1,200 mg at 06/02/23 2333   magnesium  hydroxide (MILK OF MAGNESIA) suspension 30 mL  30 mL Oral Daily PRN Gilman Lade, NP       senna (SENOKOT) tablet 17.2 mg  2 tablet Oral QHS Parker, Alvin S, MD   17.2 mg at 06/02/23 2333    Lab Results:  No results found for this or any previous visit (from the past 48 hours).   Blood Alcohol level:  Lab Results   Component Value Date   [facility] <15 05/24/2023   ETH <15 05/10/2023    Metabolic Disorder Labs: No results found for: "HGBA1C", "MPG" No results found for: "PROLACTIN" Lab Results  Component Value Date   CHOL 121 11/30/2018   TRIG 102 11/30/2018   HDL 39 (L) 11/30/2018  CHOLHDL 3.1 11/30/2018   VLDL 20 11/30/2018   LDLCALC 62 11/30/2018    Physical Findings: AIMS:  , ,  ,  ,    CIWA:    COWS:     Musculoskeletal: Strength & Muscle Tone: within normal limits Gait & Station: normal Patient leans: N/A  Psychiatric Specialty Exam:  Presentation  General Appearance:  Casually dressed, slim build, not drooling, not in any distress.  No EPS.  Eye Contact: Good.  Speech: Spontaneous.  Not pressured or loud.  Mood and Affect  Mood: Less irritable.  Affect: Restricted and mood congruent.  Thought Process  Thought Processes: Linear and slightly circumstantial.  Descriptions of Associations:Intact  Orientation:Full (Time, Place and Person)  Thought Content: Future oriented.  No current suicidal thoughts.  No homicidal thoughts.  No thoughts of violence.  No negative ruminative flooding.  No guilty ruminations.  No delusional theme.  No obsessions.  Hallucinations: No hallucination in any modality.  Sensorium  Memory: Good.  Judgment: Fair.  Insight: Fair.  Executive Functions  Concentration: Fair.  Attention Span: Less distractible.  Recall: Good.  Fund of Knowledge: Good.  Language: Good   Psychomotor Activity  Normalizing psychomotor activity    Physical Exam: Physical Exam ROS Blood pressure 95/63, pulse (!) 123, temperature 98.8 F (37.1 C), temperature source Oral, resp. rate 16, height 5\' 11"  (1.803 m), weight 67.2 kg, SpO2 100%. Body mass index is 20.66 kg/m.   Treatment Plan Summary: Patient has tolerated recent increase in dose of Clozapine .  He is less irritable today.  He remains focused on discharge.  We will keep his  current regimen and evaluate him further.  1.  Continue lithium  1200 mg at bedtime. 2.  Clozapine  500 mg at bedtime.   3.  Continue to monitor for any adverse effects from clozapine . 4.  Weekly ANC. 5.  Trough lithium  level on Saturday. 6.  Continue to redirect patient. 7.  Continue to encourage unit groups and therapeutic activities. 8.  Continue to monitor mood behavior and interaction with others. 9.  Social worker will get feedback from his mother. 10.  Social worker will coordinate discharge and aftercare planning.   Amelie Jury, MD 06/03/2023, 2:08 PM

## 2023-06-03 NOTE — [facility] Group Notes (Signed)
 Adult Psychoeducational Group Note  Date:  06/03/2023 Time:  7:41 PM  Group Topic/Focus:  Goals Group:   The focus of this group is to help patients establish daily goals to achieve during treatment and discuss how the patient can incorporate goal setting into their daily lives to aide in recovery.  Participation Level:  Did Not Attend  Participation Quality:    Affect:    Cognitive:    Insight:   Engagement in Group:    Modes of Intervention:    Additional Comments:    Ancel Baltimore 06/03/2023, 7:41 PM

## 2023-06-03 NOTE — Care Management Important Message (Signed)
 Medicare IM printed and given to social work to give to the patient. ?

## 2023-06-04 ENCOUNTER — Encounter (HOSPITAL_COMMUNITY): Payer: Self-pay

## 2023-06-04 DIAGNOSIS — F2089 Other schizophrenia: Secondary | ICD-10-CM | POA: Diagnosis not present

## 2023-06-04 NOTE — Progress Notes (Signed)
 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  Background history of schizoaffective disorder bipolar type and substance use disorder.  Presented involuntarily on account of aggressive and threatening behavior at home.  Intoxicated with cocaine at presentation.  Chart was reviewed today.  Patient discussed at multidisciplinary team meeting.  Nursing staff reports that he has been appropriate on the unit.  He has been taking his medicines as recommended.  No major behavioral issues.  No side effects observed or reported.  I spoke to his mother today.  She states that patient is doing a lot better on his current regimen.  Mother feels that he is back to his baseline.  Family will continue to provide support for him.  Seen today.  Tells me that he is in good spirits.  States that he is doing well.  He is not endorsing any worries or any concerns.  He is dismissive of hallucination in no modality.  He is not endorsing any delusions today.  No violent thoughts towards himself or towards anybody else.  States that his medicine is working well.  He wants to go home soon.  He was excited to learn about the possibility of discharge tomorrow once we have his trough lithium  level.  Encouraged to ventilate his feelings rather than acting out on the unit.  Principal Problem: Schizophrenia (HCC) Diagnosis: Principal Problem:   Schizophrenia (HCC) Active Problems:   Schizoaffective disorder, bipolar type (HCC)  Total Time spent with patient: 20 minutes  Past Psychiatric History:  See H&P.  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 No pertinent surgical history. Family History: History reviewed. No pertinent  family history. Family Psychiatric  History:  See H&P.  Social History:  Social History   Substance and Sexual Activity  Alcohol Use Not Currently     Social History   Substance and Sexual Activity  Drug Use Yes   Types: Cocaine, Marijuana   Comment: crack 06-26-19    Social History   Socioeconomic History   Marital status: Single    Spouse name: Not on file   Number of children: Not on file   Years of education: Not on file   Highest education level: Not on file  Occupational History   Not on file  Tobacco Use   Smoking status: Every Day   Smokeless tobacco: Never  Substance and Sexual Activity   Alcohol use: Not Currently   Drug use: Yes    Types: Cocaine, Marijuana    Comment: crack 06-26-19   Sexual activity: Not on file  Other Topics Concern   Not on file  Social History Narrative   Not on file   Social Drivers of Health   Financial Resource Strain: Not on file  Food Insecurity: No Food Insecurity (05/25/2023)   Hunger Vital Sign    Worried About Running Out of Food in the Last Year: Never true    Ran Out of Food in the Last Year: Never true  Transportation Needs: No Transportation Needs (05/25/2023)   PRAPARE - Administrator, Civil Service (Medical): No    Lack of Transportation (Non-Medical): No  Physical Activity: Not on file  Stress: Not on file  Social Connections:  Unknown (05/22/2021)   Received from [facility], [facility]   Social Network    Social Network: Not on file    Current Medications: Current Facility-Administered Medications  Medication Dose Route Frequency Provider Last Rate Last Admin   acetaminophen  (TYLENOL ) tablet 1,000 mg  1,000 mg Oral Q6H PRN Parker, Alvin S, MD   1,000 mg at 06/03/23 1806   alum & mag hydroxide-simeth (MAALOX/MYLANTA) 200-200-20 MG/5ML suspension 30 mL  30 mL Oral Q4H PRN Gilman Lade, NP       aspirin  tablet 325 mg  325 mg Oral Q6H PRN Parker, Alvin S, MD       benzocaine  (ORAJEL)  10 % mucosal gel   Mouth/Throat QID PRN Dorthea Gauze, NP   Given at 06/03/23 1805   cloZAPine  (CLOZARIL ) tablet 500 mg  500 mg Oral QHS Alvin Diffee, Iline Mallory, MD   500 mg at 06/03/23 2353   cyanocobalamin  (VITAMIN B12) tablet 1,000 mcg  1,000 mcg Oral Daily Parker, Alvin S, MD   1,000 mcg at 06/04/23 0753   haloperidol  (HALDOL ) tablet 5 mg  5 mg Oral Q6H PRN Parker, Alvin S, MD   5 mg at 05/30/23 5621   And   LORazepam  (ATIVAN ) tablet 2 mg  2 mg Oral Q6H PRN Parker, Alvin S, MD   2 mg at 05/30/23 3086   And   diphenhydrAMINE  (BENADRYL ) capsule 50 mg  50 mg Oral Q6H PRN Parker, Alvin S, MD   50 mg at 05/30/23 5784   haloperidol  lactate (HALDOL ) injection 5 mg  5 mg Intramuscular TID PRN Gilman Lade, NP       And   diphenhydrAMINE  (BENADRYL ) injection 50 mg  50 mg Intramuscular TID PRN Gilman Lade, NP       And   LORazepam  (ATIVAN ) injection 2 mg  2 mg Intramuscular TID PRN Gilman Lade, NP       haloperidol  lactate (HALDOL ) injection 10 mg  10 mg Intramuscular TID PRN Gilman Lade, NP   10 mg at 06/02/23 1051   And   diphenhydrAMINE  (BENADRYL ) injection 50 mg  50 mg Intramuscular TID PRN Gilman Lade, NP   50 mg at 06/02/23 1051   And   LORazepam  (ATIVAN ) injection 2 mg  2 mg Intramuscular TID PRN Gilman Lade, NP   2 mg at 06/02/23 1051   folic acid  (FOLVITE ) tablet 1 mg  1 mg Oral Daily Parker, Alvin S, MD   1 mg at 06/04/23 6962   hydrOXYzine  (ATARAX ) tablet 50 mg  50 mg Oral TID PRN Parker, Alvin S, MD   50 mg at 06/01/23 2028   lithium  carbonate (LITHOBID ) ER tablet 1,200 mg  1,200 mg Oral QHS Parker, Alvin S, MD   1,200 mg at 06/03/23 2353   magnesium  hydroxide (MILK OF MAGNESIA) suspension 30 mL  30 mL Oral Daily PRN Gilman Lade, NP       senna (SENOKOT) tablet 17.2 mg  2 tablet Oral QHS Parker, Alvin S, MD   17.2 mg at 06/03/23 2353    Lab Results:  No results found for this or any previous visit (from the past 48  hours).   Blood Alcohol level:  Lab Results  Component Value Date   [facility] <15 05/24/2023   ETH <15 05/10/2023    Metabolic Disorder Labs: No results found for: "HGBA1C", "MPG" No results found for: "PROLACTIN" Lab Results  Component Value Date   CHOL 121 11/30/2018  TRIG 102 11/30/2018   HDL 39 (L) 11/30/2018   CHOLHDL 3.1 11/30/2018   VLDL 20 11/30/2018   LDLCALC 62 11/30/2018    Physical Findings: AIMS:  , ,  ,  ,    CIWA:    COWS:     Musculoskeletal: Strength & Muscle Tone: within normal limits Gait & Station: normal Patient leans: N/A  Psychiatric Specialty Exam:  Presentation  General Appearance:  Casually dressed, slim build, not drooling, not in any distress.  No EPS.  Eye Contact: Good.  Speech: Spontaneous.  Normal rate, tone and volume.  Mood and Affect  Mood: Euthymic.[name]  Affect: Mobilizing positive affect.  Thought Process  Thought Processes: Linear and goal directed.  Descriptions of Associations:Intact  Orientation:Full (Time, Place and Person)  Thought Content: Future oriented.  No current suicidal thoughts.  No homicidal thoughts.  No thoughts of violence.  No negative ruminative flooding.  No guilty ruminations.  No delusional theme.  No obsessions.  Hallucinations: No hallucination in any modality.  Sensorium  Memory: Good.  Judgment: Better.  Insight: Good.  Executive Functions  Concentration: Better.  Attention Span: Better.  Recall: Good.  Fund of Knowledge: Good.  Language: Good   Psychomotor Activity  Normal psychomotor activity    Physical Exam: Physical Exam ROS Blood pressure 112/65, pulse 96, temperature 98.8 F (37.1 C), temperature source Oral, resp. rate 16, height 5\' 11"  (1.803 m), weight 67.2 kg, SpO2 100%. Body mass index is 20.66 kg/m.   Treatment Plan Summary: Patient has stabilized well on his current regimen.  He is back to his baseline.  No dangerousness.  He is scheduled to  have a trough lithium  level tomorrow.  Hopeful discharge tomorrow if he maintains stability.  1.  Continue lithium  1200 mg at bedtime. 2.  Clozapine  500 mg at bedtime.   3.  Continue to monitor for any adverse effects from clozapine . 4.  Weekly ANC. 5.  Trough lithium  level on Saturday. 6.  Continue to redirect patient. 7.  Continue to encourage unit groups and therapeutic activities. 8.  Continue to monitor mood behavior and interaction with others. 9. Social worker will coordinate discharge and aftercare planning.   Amelie Jury, MD 06/04/2023, 2:54 PM

## 2023-06-04 NOTE — Group Note (Signed)
 Date:  06/04/2023 Time:  9:07 PM  Group Topic/Focus:  Wrap-Up Group:   The focus of this group is to help patients review their daily goal of treatment and discuss progress on daily workbooks.    Participation Level:  Active  Participation Quality:  Appropriate  Affect:  Appropriate  Cognitive:  Appropriate  Insight: Appropriate  Engagement in Group:  Engaged  Modes of Intervention:  Education and Exploration  Additional Comments:  Patient attended and participated in group tonight. He reports that his goal today was to go home. He is schedule to leave tomorrow.  Eugena Herter Dacosta 06/04/2023, 9:07 PM

## 2023-06-04 NOTE — Group Note (Signed)
 Recreation Therapy Group Note   Group Topic:Other  Group Date: 06/04/2023 Start Time: 1025 End Time: 1110 Facilitators: Keisy Strickler-McCall, LRT,CTRS Location: [address] Dayroom   Group Topic/Focus: Personal Development   Goal Area(s) Addresses:  Patient will identify things that help them develop as a person.   Patient will express how personal development can be used post d/c.  Behavioral Response:    Intervention: Movie  Activity: LRT played "The Last Champion". 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 He returned home when his mother passed and had to face all judgement and hatred towards him head on by constantly keeping it in the for front and not letting him get past it.    Affect/Mood: N/A   Participation Level: Did not attend    Clinical Observations/Individualized Feedback:     Plan: Continue to engage patient in RT group sessions 2-3x/week.   Beckham Buxbaum-McCall, LRT,CTRS 06/04/2023 1:04 PM

## 2023-06-04 NOTE — Plan of Care (Signed)
  Problem: Health Behavior/Discharge Planning: Goal: Compliance with treatment plan for underlying cause of condition will improve Outcome: Progressing   Problem: Safety: Goal: Periods of time without injury will increase Outcome: Progressing   Problem: Coping: Goal: Coping ability will improve Outcome: Progressing Goal: Will verbalize feelings Outcome: Progressing   

## 2023-06-04 NOTE — BHH Group Notes (Signed)
 Adult Psychoeducational Group Note  Date:  06/04/2023 Time:  7:37 PM  Group Topic/Focus:  Goals Group:   The focus of this group is to help patients establish daily goals to achieve during treatment and discuss how the patient can incorporate goal setting into their daily lives to aide in recovery. Orientation:   The focus of this group is to educate the patient on the purpose and policies of crisis stabilization and provide a format to answer questions about their admission.  The group details unit policies and expectations of patients while admitted.  Participation Level:  Active  Participation Quality:  Appropriate  Affect:  Appropriate  Cognitive:  Appropriate  Insight: Appropriate  Engagement in Group:  Engaged  Modes of Intervention:  Discussion  Additional Comments:  Pt attended the goals group and remained appropriate and engaged throughout the duration of the group.   Alicianna Litchford O 06/04/2023, 7:37 PM

## 2023-06-04 NOTE — BH IP Treatment Plan (Signed)
 Interdisciplinary Treatment and Diagnostic Plan Update  06/04/2023 Time of Session: 12:25 PM - UPDATE [name] MRN: [id number]  Principal Diagnosis: Schizophrenia (HCC)  Secondary Diagnoses: Principal Problem:   Schizophrenia (HCC) Active Problems:   Schizoaffective disorder, bipolar type (HCC)   Current Medications:  Current Facility-Administered Medications  Medication Dose Route Frequency Provider Last Rate Last Admin   acetaminophen  (TYLENOL ) tablet 1,000 mg  1,000 mg Oral Q6H PRN Parker, Alvin S, MD   1,000 mg at 06/03/23 1806   alum & mag hydroxide-simeth (MAALOX/MYLANTA) 200-200-20 MG/5ML suspension 30 mL  30 mL Oral Q4H PRN Gilman Lade, NP       aspirin  tablet 325 mg  325 mg Oral Q6H PRN Parker, Alvin S, MD       benzocaine  (ORAJEL) 10 % mucosal gel   Mouth/Throat QID PRN Dorthea Gauze, NP   1 Application at 06/04/23 1657   cloZAPine  (CLOZARIL ) tablet 500 mg  500 mg Oral QHS Izediuno, Iline Mallory, MD   500 mg at 06/03/23 2353   cyanocobalamin  (VITAMIN B12) tablet 1,000 mcg  1,000 mcg Oral Daily Parker, Alvin S, MD   1,000 mcg at 06/04/23 0753   haloperidol  (HALDOL ) tablet 5 mg  5 mg Oral Q6H PRN Parker, Alvin S, MD   5 mg at 05/30/23 4098   And   LORazepam  (ATIVAN ) tablet 2 mg  2 mg Oral Q6H PRN Parker, Alvin S, MD   2 mg at 05/30/23 1191   And   diphenhydrAMINE  (BENADRYL ) capsule 50 mg  50 mg Oral Q6H PRN Parker, Alvin S, MD   50 mg at 05/30/23 4782   haloperidol  lactate (HALDOL ) injection 5 mg  5 mg Intramuscular TID PRN Gilman Lade, NP       And   diphenhydrAMINE  (BENADRYL ) injection 50 mg  50 mg Intramuscular TID PRN Gilman Lade, NP       And   LORazepam  (ATIVAN ) injection 2 mg  2 mg Intramuscular TID PRN Gilman Lade, NP       haloperidol  lactate (HALDOL ) injection 10 mg  10 mg Intramuscular TID PRN Gilman Lade, NP   10 mg at 06/02/23 1051   And   diphenhydrAMINE  (BENADRYL ) injection 50 mg  50 mg Intramuscular TID  PRN Gilman Lade, NP   50 mg at 06/02/23 1051   And   LORazepam  (ATIVAN ) injection 2 mg  2 mg Intramuscular TID PRN Gilman Lade, NP   2 mg at 06/02/23 1051   folic acid  (FOLVITE ) tablet 1 mg  1 mg Oral Daily Parker, Alvin S, MD   1 mg at 06/04/23 9562   hydrOXYzine  (ATARAX ) tablet 50 mg  50 mg Oral TID PRN Parker, Alvin S, MD   50 mg at 06/01/23 2028   lithium  carbonate (LITHOBID ) ER tablet 1,200 mg  1,200 mg Oral QHS Parker, Alvin S, MD   1,200 mg at 06/03/23 2353   magnesium  hydroxide (MILK OF MAGNESIA) suspension 30 mL  30 mL Oral Daily PRN Gilman Lade, NP       senna (SENOKOT) tablet 17.2 mg  2 tablet Oral QHS Parker, Alvin S, MD   17.2 mg at 06/03/23 2353   PTA Medications: Medications Prior to Admission  Medication Sig Dispense Refill Last Dose/Taking   penicillin  v potassium (VEETID) 500 MG tablet Take 1 tablet (500 mg total) by mouth 4 (four) times daily. (Patient not taking: Reported on 05/24/2023) 40 tablet 0     Patient Stressors:  Patient Strengths:    Treatment Modalities: Medication Management, Group therapy, Case management,  1 to 1 session with clinician, Psychoeducation, Recreational therapy.   Physician Treatment Plan for Primary Diagnosis: Schizophrenia (HCC) Long Term Goal(s):     Short Term Goals:    Medication Management: Evaluate patient's response, side effects, and tolerance of medication regimen.  Therapeutic Interventions: 1 to 1 sessions, Unit Group sessions and Medication administration.  Evaluation of Outcomes: Progressing  Physician Treatment Plan for Secondary Diagnosis: Principal Problem:   Schizophrenia (HCC) Active Problems:   Schizoaffective disorder, bipolar type (HCC)  Long Term Goal(s):     Short Term Goals:       Medication Management: Evaluate patient's response, side effects, and tolerance of medication regimen.  Therapeutic Interventions: 1 to 1 sessions, Unit Group sessions and Medication  administration.  Evaluation of Outcomes: Progressing   RN Treatment Plan for Primary Diagnosis: Schizophrenia (HCC) Long Term Goal(s): Knowledge of disease and therapeutic regimen to maintain health will improve and    Short Term Goals: Ability to remain free from injury will improve, Ability to verbalize frustration and anger appropriately will improve, Ability to verbalize feelings will improve, and Ability to disclose and discuss suicidal ideas  Medication Management: RN will administer medications as ordered by provider, will assess and evaluate patient's response and provide education to patient for prescribed medication. RN will report any adverse and/or side effects to prescribing provider.  Therapeutic Interventions: 1 on 1 counseling sessions, Psychoeducation, Medication administration, Evaluate responses to treatment, Monitor vital signs and CBGs as ordered, Perform/monitor CIWA, COWS, AIMS and Fall Risk screenings as ordered, Perform wound care treatments as ordered.  Evaluation of Outcomes: Progressing   LCSW Treatment Plan for Primary Diagnosis: Schizophrenia (HCC) Long Term Goal(s): Safe transition to appropriate next level of care at discharge, Engage patient in therapeutic group addressing interpersonal concerns.  Short Term Goals: Engage patient in aftercare planning with referrals and resources, Increase ability to appropriately verbalize feelings, Facilitate acceptance of mental health diagnosis and concerns, and Identify triggers associated with mental health/substance abuse issues  Therapeutic Interventions: Assess for all discharge needs, 1 to 1 time with Social worker, Explore available resources and support systems, Assess for adequacy in community support network, Educate family and significant other(s) on suicide prevention, Complete Psychosocial Assessment, Interpersonal group therapy.  Evaluation of Outcomes: Progressing   Progress in Treatment: Attending  groups: No. Participating in groups: No. Taking medication as prescribed: Yes. Toleration medication: Yes. Family/Significant other contact made: Yes, contacted [name] (mom) [phone]  Patient understands diagnosis: Yes. Discussing patient identified problems/goals with staff: No. Medical problems stabilized or resolved: Yes. Denies suicidal/homicidal ideation: Yes. Issues/concerns per patient self-inventory: No.   New problem(s) identified: No, Describe:  none   New Short Term/Long Term Goal(s): detox, medication management for mood stabilization; elimination of SI thoughts; development of comprehensive mental wellness/sobriety plan     Patient Goals:  "Discharge and work on my anger"   Discharge Plan or Barriers: Patient recently admitted. CSW will continue to follow and assess for appropriate referrals and possible discharge planning.      Reason for Continuation of Hospitalization: Depression Medication stabilization Suicidal ideation Withdrawal symptoms   Estimated Length of Stay: 1 - 2 days  Last 3 [place] Suicide Severity Risk Score: Flowsheet Row ED to Hosp-Admission (Current) [facility]2023 in [facility] ED from 05/10/2023 in [facility] Department at [facility]  C-SSRS RISK CATEGORY No Risk No Risk No Risk       Last PHQ 2/9 Scores:     No data to display          Scribe for Treatment Team: Bettey Muraoka O Sampson Self, LCSWA 06/04/2023 7:28 PM

## 2023-06-05 DIAGNOSIS — F2089 Other schizophrenia: Secondary | ICD-10-CM

## 2023-06-05 LAB — LITHIUM LEVEL: Lithium Lvl: 0.81 mmol/L (ref 0.60–1.20)

## 2023-06-05 MED ORDER — CLOZAPINE 200 MG PO TABS: 500.0000 mg | ORAL_TABLET | Freq: Every day | ORAL | 0 refills | Status: DC

## 2023-06-05 MED ORDER — SENNA 8.6 MG PO TABS: 2.0000 | ORAL_TABLET | Freq: Every day | ORAL | 0 refills | Status: DC

## 2023-06-05 MED ORDER — LITHIUM CARBONATE ER 300 MG PO TBCR: 1200.0000 mg | EXTENDED_RELEASE_TABLET | Freq: Every day | ORAL | 0 refills | Status: DC

## 2023-06-05 NOTE — Progress Notes (Signed)
  [facility] Adult Case Management Discharge Plan :  Will you be returning to the same living situation after discharge:  Yes,  pt will return home  At discharge, do you have transportation home?: Yes,  CSW will assist with transportation  Do you have the ability to pay for your medications: Yes,  MEDICARE / MEDICARE PART A AND B  Release of information consent forms completed and in the chart;  Patient's signature needed at discharge.  Patient to Follow up at:  Follow-up Information     [facility], [facility]. Go on 06/16/2023.   Why: You have a hospital follow up appointment on 06/16/23 at 10:00 am.  The appointment will be held in person.  Following this appointment, you will be scheduled for therapy and medication management services. Contact information: [address] [place] [address] [phone]                 Next level of care provider has access to [facility] Link:no  Safety Planning and Suicide Prevention discussed: Yes,  [name] (mom) [phone]     Has patient been referred to the Quitline?: Patient refused referral for treatment  Patient has been referred for addiction treatment: No known substance use disorder.  [address] LCSWA 06/05/2023, 9:23 AM

## 2023-06-05 NOTE — Discharge Summary (Signed)
 Physician Discharge Summary Note  Patient:  [name] is an 33 y.o., male MRN:  [id number] DOB:  09-09-1990 Patient phone:  [phone] (home)  Patient address:   [address] [place] [address],  Total Time spent with patient: 45 minutes  Date of Admission:  05/25/2023 Date of Discharge: 06/05/2023  Reason for Admission:   33 year old African-American male, single, employed in Aeronautical engineer, lives with his family. Background history of schizoaffective disorder bipolar type and substance use disorder. Presented involuntarily on account of aggressive and threatening behavior at home. Intoxicated with cocaine at presentation.   Principal Problem: Schizophrenia [facility]) Discharge Diagnoses: Principal Problem:   Schizophrenia (HCC) Active Problems:   Schizoaffective disorder, bipolar type [facility])   Past Psychiatric History:  Extensive history of schizoaffective disorder bipolar type.  Has been tried on multiple medications and treatment resistance.  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 No pertinent surgical history. Family History: History reviewed. No pertinent family history. Family Psychiatric  History:   Social History:  Social History   Substance and Sexual Activity  Alcohol Use Not Currently     Social History   Substance and Sexual Activity  Drug Use Yes   Types: Cocaine, Marijuana   Comment: crack 06-26-19    Social History   Socioeconomic History   Marital status: Single    Spouse name: Not on file   Number of children: Not on file   Years of education: Not on file   Highest education level: Not on file  Occupational History   Not on file  Tobacco Use   Smoking status: Every Day   Smokeless tobacco: Never  Substance and Sexual Activity   Alcohol use: Not Currently   Drug use: Yes     Types: Cocaine, Marijuana    Comment: crack 06-26-19   Sexual activity: Not on file  Other Topics Concern   Not on file  Social History Narrative   Not on file   Social Drivers of Health   Financial Resource Strain: Not on file  Food Insecurity: No Food Insecurity (05/25/2023)   Hunger Vital Sign    Worried About Running Out of Food in the Last Year: Never true    Ran Out of Food in the Last Year: Never true  Transportation Needs: No Transportation Needs (05/25/2023)   PRAPARE - Administrator, Civil Service (Medical): No    Lack of Transportation (Non-Medical): No  Physical Activity: Not on file  Stress: Not on file  Social Connections: Unknown (05/22/2021)   [facility]   Social Network    Social Network: Not on file    Hospital Course:   Patient was admitted on suicide precautions.  He was very volatile at presentation.  He was initially seen started on aripiprazole  and lithium .  He did not respond to treatment.  He was switched to clozapine  which he tolerated well and responded well.  Patient was very aggressive during his initial hospital days.  He required emergency medications.  He did not require any medical emergencies. As the medication kicked into his system, sleep-wake cycle became more regularized.  Patient became more in tune with reality.  He had minimal participation with unit groups and therapeutic activities.  His family was supportive during his hospitalization.  Seen today.  Feels good  and ready to go.  Dismissed hallucination in no modality.  No overt delusional theme.  Full in control of his thoughts and his actions.  No rageful thoughts towards self or towards others.  He is tolerating his medicines without any adverse effects.  Nursing staff reports that he slept well.  No challenging behavior.  Trough lithium  level was 0.81.  Patient and team agrees that he is back to his baseline.  Team agrees with discharge  today.  Physical Findings: AIMS:  , ,  ,  ,    CIWA:    COWS:     Musculoskeletal: Strength & Muscle Tone: within normal limits Gait & Station: normal Patient leans: N/A   Psychiatric Specialty Exam:  Presentation  General Appearance:  Casually dressed, slim build, not in any distress, appropriate behavior, engaged politely.  No EPS.  Eye Contact: Good.  Speech: Spontaneous.  Normal rate, tone and volume.  Normal prosody of speech.  Mood and Affect  Mood: Euthymic.  Affect: Full range and appropriate.  Thought Process  Thought Processes: Linear and goal directed.  Descriptions of Associations:Intact  Orientation:Full (Time, Place and Person)  Thought Content: Future oriented.  No current suicidal thoughts.  No homicidal thoughts.  No thoughts of violence.  No negative ruminative flooding.  No guilty ruminations.  No delusional theme.  No obsessions.  Hallucinations: No hallucination in any modality.  Sensorium  Memory: Good.  Judgment: Good.  Insight: Good  Executive Functions  Concentration: Good.  Attention Span: Good.  Recall: Good.  Fund of Knowledge: Good.  Language: Good   Psychomotor Activity  Normal psychomotor activity     Physical Exam: Physical Exam ROS Blood pressure 109/80, pulse (!) 108, temperature 98.3 F (36.8 C), temperature source Oral, resp. rate 16, height 5\' 11"  (1.803 m), weight 67.2 kg, SpO2 100%. Body mass index is 20.66 kg/m.   Social History   Tobacco Use  Smoking Status Every Day  Smokeless Tobacco Never   Tobacco Cessation:  N/A, patient does not currently use tobacco products   Blood Alcohol level:  Lab Results  Component Value Date   [facility] <15 05/24/2023   ETH <15 05/10/2023    Metabolic Disorder Labs:  No results found for: "HGBA1C", "MPG" No results found for: "PROLACTIN" Lab Results  Component Value Date   CHOL 121 11/30/2018   TRIG 102 11/30/2018   HDL 39 (L) 11/30/2018    CHOLHDL 3.1 11/30/2018   VLDL 20 11/30/2018   LDLCALC 62 11/30/2018    See Psychiatric Specialty Exam and Suicide Risk Assessment completed by Attending Physician prior to discharge.  Discharge destination:  Home  Is patient on multiple antipsychotic therapies at discharge:  No   Has Patient had three or more failed trials of antipsychotic monotherapy by history:  No  Recommended Plan for Multiple Antipsychotic Therapies: NA  Discharge Instructions     Diet - low sodium heart healthy   Complete by: As directed    Increase activity slowly   Complete by: As directed       Allergies as of 06/05/2023       Reactions   Ibuprofen Swelling   Tongue swelling   Risperidone And Related Other (See Comments), Swelling   gynecomastia gynecomastia   Ziprasidone  Swelling   Tongue swells   Benztropine  Other (See Comments)   Causes confusion, depression, and delusions   Quetiapine  Other (See Comments)   Depression, suicidality, adverse effect: seizures         Medication List  STOP taking these medications    penicillin  v potassium 500 MG tablet Commonly known as: VEETID       TAKE these medications      Indication  clozapine  200 MG tablet Commonly known as: CLOZARIL  Take 2.5 tablets (500 mg total) by mouth at bedtime.  Indication: Schizoaffective Disorder   lithium  carbonate 300 MG ER tablet Commonly known as: LITHOBID  Take 4 tablets (1,200 mg total) by mouth at bedtime.  Indication: Schizoaffective Disorder   senna 8.6 MG Tabs tablet Commonly known as: SENOKOT Take 2 tablets (17.2 mg total) by mouth at bedtime.  Indication: Constipation        Follow-up Information     [facility], [facility]. Go on 06/16/2023.   Why: You have a hospital follow up appointment on 06/16/23 at 10:00 am.  The appointment will be held in person.  Following this appointment, you will be scheduled for therapy and medication management services. Contact information: [address] [place] [address] [phone]                 Follow-up recommendations:    Patient will stay on his medicines as recommended.  Patient will follow up as recommended.  No restrictions with respect to diet or activity.  Signed: Amelie Jury, MD 06/05/2023, 9:42 AM

## 2023-06-05 NOTE — Progress Notes (Signed)
 D: Pt A & O X 3. Denies SI, HI, AVH and pain at this time. D/C home as ordered. Taxi voucher given for transportation. A: D/C instructions reviewed with pt including prescriptions, medication samples and follow up appointment at [facility];compliance encouraged. All belongings from assigned  locker returned to pt at time of departure. Scheduled medications given with verbal education and effects monitored. Safety checks maintained without incident till time of d/c.  R: Pt receptive to care. Compliant with medications when offered. Denies adverse drug reactions when assessed. Verbalized understanding related to d/c instructions. Signed belonging sheet in agreement with items received from locker. Ambulatory with a steady gait. Appears to be in no physical distress at time of departure.

## 2023-06-05 NOTE — Plan of Care (Signed)
  Problem: Education: Goal: Emotional status will improve Outcome: Progressing Goal: Mental status will improve Outcome: Progressing   Problem: Activity: Goal: Interest or engagement in activities will improve Outcome: Progressing   Problem: Coping: Goal: Ability to demonstrate self-control will improve Outcome: Progressing   Problem: Safety: Goal: Periods of time without injury will increase Outcome: Progressing

## 2023-06-05 NOTE — Care Management Important Message (Signed)
 Important Message  Patient Details  Name: [name] MRN: [id number] Date of Birth: 10-17-1990   Important Message Given:  Yes - Medicare IM     Claudio Culver, LCSWA 06/05/2023, 9:24 AM

## 2023-06-05 NOTE — Progress Notes (Signed)
   06/04/23 1950  Psych Admission Type (Psych Patients Only)  Admission Status Involuntary  Psychosocial Assessment  Patient Complaints Worrying  Eye Contact Fair  Facial Expression Animated  Affect Appropriate to circumstance  Speech Logical/coherent  Interaction Assertive  Motor Activity Restless  Appearance/Hygiene Disheveled;Body odor  Behavior Characteristics Cooperative;Appropriate to situation  Mood Pleasant;Preoccupied  Thought Process  Coherency Circumstantial  Content Preoccupation  Delusions None reported or observed  Perception Depersonalization  Hallucination None reported or observed  Judgment Poor  Confusion None  Danger to Self  Current suicidal ideation? Denies

## 2023-06-05 NOTE — [facility] Suicide Risk Assessment (Signed)
 [facility] Discharge Suicide Risk Assessment   Principal Problem: Schizophrenia [facility]) Discharge Diagnoses: Principal Problem:   Schizophrenia (HCC) Active Problems:   Schizoaffective disorder, bipolar type (HCC)   Total Time spent with patient: 20 minutes  Musculoskeletal: Strength & Muscle Tone: within normal limits Gait & Station: normal Patient leans: N/A  Psychiatric Specialty Exam  Presentation  General Appearance:  Casually dressed, slim build, not in any distress, appropriate behavior, engaged politely.  No EPS.  Eye Contact: Good.  Speech: Spontaneous.  Normal rate, tone and volume.  Normal prosody of speech.  Mood and Affect  Mood: Euthymic.  Affect: Full range and appropriate.  Thought Process  Thought Processes: Linear and goal directed.  Descriptions of Associations:Intact  Orientation:Full (Time, Place and Person)  Thought Content: Future oriented.  No current suicidal thoughts.  No homicidal thoughts.  No thoughts of violence.  No negative ruminative flooding.  No guilty ruminations.  No delusional theme.  No obsessions.  Hallucinations: No hallucination in any modality.  Sensorium  Memory: Good.  Judgment: Good.  Insight: Good  Executive Functions  Concentration: Good.  Attention Span: Good.  Recall: Good.  Fund of Knowledge: Good.  Language: Good   Psychomotor Activity  Normal psychomotor activity   Physical Exam: Physical Exam ROS Blood pressure 109/80, pulse (!) 108, temperature 98.3 F (36.8 C), temperature source Oral, resp. rate 16, height 5\' 11"  (1.803 m), weight 67.2 kg, SpO2 100%. Body mass index is 20.66 kg/m.  Mental Status Per Nursing Assessment::   On Admission:  NA  Demographic Factors:  Male  Loss Factors: NA  Historical Factors: Family history of mental illness or substance abuse  Risk Reduction Factors:   Responsible for children under 63 years of age, Sense of responsibility to family,  Employed, Living with another person, especially a relative, Positive social support, Positive therapeutic relationship, and Positive coping skills or problem solving skills  Continued Clinical Symptoms:  Psychosis and mania has completely resolved.  Cognitive Features That Contribute To Risk:  None    Suicide Risk:  Minimal: No current suicidal thoughts.  No current homicidal thoughts.  No current thoughts of violence.  Modifiable risk factor targeted during this admission as mania and psychosis.  Patient is back on psychotropic medications.  He is stable.  He has good support from his family. Patient is stable for care at the lower setting.  Follow-up Information     [facility], [facility] . Go on 06/16/2023.   Why: You have a hospital follow up appointment on 06/16/23 at 10:00 am.  The appointment will be held in person.  Following this appointment, you will be scheduled for therapy and medication management services. Contact information: [address] [place] [address] [phone]                 Plan Of Care/Follow-up recommendations:   See discharge summary  Amelie Jury, MD 06/05/2023, 9:39 AM

## 2023-07-25 ENCOUNTER — Emergency Department
Admission: EM | Admit: 2023-07-25 | Discharge: 2023-07-25 | Discharge status: Against Medical Advice | Attending: Emergency Medicine | Admitting: Emergency Medicine

## 2023-07-25 DIAGNOSIS — F250 Schizoaffective disorder, bipolar type: Secondary | ICD-10-CM | POA: Diagnosis not present

## 2023-07-25 DIAGNOSIS — F209 Schizophrenia, unspecified: Secondary | ICD-10-CM | POA: Insufficient documentation

## 2023-07-25 DIAGNOSIS — Z5321 Procedure and treatment not carried out due to patient leaving prior to being seen by health care provider: Secondary | ICD-10-CM | POA: Diagnosis not present
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
## 2023-07-26 ENCOUNTER — Emergency Department
Admission: EM | Admit: 2023-07-26 | Discharge: 2023-07-27 | Disposition: A | Discharge status: Other Acute Inpatient Hospital | Attending: Emergency Medicine | Admitting: Emergency Medicine

## 2023-07-26 ENCOUNTER — Emergency Department

## 2023-07-26 DIAGNOSIS — H5712 Ocular pain, left eye: Secondary | ICD-10-CM | POA: Diagnosis present

## 2023-07-26 DIAGNOSIS — Z79899 Other long term (current) drug therapy: Secondary | ICD-10-CM | POA: Insufficient documentation

## 2023-07-26 DIAGNOSIS — R45851 Suicidal ideations: Secondary | ICD-10-CM | POA: Insufficient documentation

## 2023-07-26 DIAGNOSIS — S00212A Abrasion of left eyelid and periocular area, initial encounter: Secondary | ICD-10-CM | POA: Diagnosis not present

## 2023-07-26 DIAGNOSIS — S0990XA Unspecified injury of head, initial encounter: Secondary | ICD-10-CM | POA: Insufficient documentation

## 2023-07-26 DIAGNOSIS — F250 Schizoaffective disorder, bipolar type: Secondary | ICD-10-CM | POA: Insufficient documentation

## 2023-07-26 LAB — COMPREHENSIVE METABOLIC PANEL WITH GFR
ALT: 26 U/L (ref 0–44)
AST: 69 U/L — ABNORMAL HIGH (ref 15–41)
Albumin: 3.9 g/dL (ref 3.5–5.0)
Alkaline Phosphatase: 67 U/L (ref 38–126)
Anion gap: 8 (ref 5–15)
BUN: 19 mg/dL (ref 6–20)
CO2: 25 mmol/L (ref 22–32)
Calcium: 8.9 mg/dL (ref 8.9–10.3)
Chloride: 109 mmol/L (ref 98–111)
Creatinine, Ser: 0.84 mg/dL (ref 0.61–1.24)
GFR, Estimated: >60 mL/min (ref >60)
Glucose, Bld: 119 mg/dL — ABNORMAL HIGH (ref 70–99)
Potassium: 3.3 mmol/L — ABNORMAL LOW (ref 3.5–5.1)
Sodium: 142 mmol/L (ref 135–145)
Total Bilirubin: 1.3 mg/dL — ABNORMAL HIGH (ref 0.0–1.2)
Total Protein: 6.9 g/dL (ref 6.5–8.1)

## 2023-07-26 LAB — ETHANOL: Alcohol, Ethyl (B): <15 mg/dL (ref <15)

## 2023-07-26 LAB — CBC
HCT: 37.9 % — ABNORMAL LOW (ref 39.0–52.0)
Hemoglobin: 13.1 g/dL (ref 13.0–17.0)
MCH: 29.6 pg (ref 26.0–34.0)
MCHC: 34.6 g/dL (ref 30.0–36.0)
MCV: 85.6 fL (ref 80.0–100.0)
Platelets: 221 K/uL (ref 150–400)
RBC: 4.43 MIL/uL (ref 4.22–5.81)
RDW: 13.2 % (ref 11.5–15.5)
WBC: 6.5 K/uL (ref 4.0–10.5)
nRBC: 0 % (ref 0.0–0.2)

## 2023-07-26 MED ORDER — CLOZAPINE 100 MG PO TABS: 500.0000 mg | ORAL_TABLET | Freq: Every day | ORAL | Status: DC

## 2023-07-26 MED ORDER — CLOZAPINE 100 MG PO TABS: 400.0000 mg | ORAL_TABLET | Freq: Every day | ORAL | Status: DC

## 2023-07-26 MED ORDER — CLOZAPINE 100 MG PO TABS: 300.0000 mg | ORAL_TABLET | Freq: Every day | ORAL | Status: DC

## 2023-07-26 MED ORDER — CLOZAPINE 100 MG PO TABS: 200.0000 mg | ORAL_TABLET | Freq: Every day | ORAL | Status: DC

## 2023-07-26 MED ORDER — CLOZAPINE 25 MG PO TABS
25.0000 mg | ORAL_TABLET | Freq: Two times a day (BID) | ORAL | Status: AC
  Administered 2023-07-26 (×2): 25 mg via ORAL
  Filled 2023-07-26 (×3): qty 1

## 2023-07-26 MED ORDER — CLOZAPINE 100 MG PO TABS: 100.0000 mg | ORAL_TABLET | Freq: Every day | ORAL | Status: DC

## 2023-07-26 MED ORDER — LITHIUM CARBONATE ER 450 MG PO TBCR
1200.0000 mg | EXTENDED_RELEASE_TABLET | Freq: Every day | ORAL | Status: DC
  Administered 2023-07-26: 1200 mg via ORAL
  Filled 2023-07-26 (×2): qty 1

## 2023-07-26 MED ORDER — BACITRACIN ZINC 500 UNIT/GM EX OINT
TOPICAL_OINTMENT | Freq: Once | CUTANEOUS | Status: AC
  Filled 2023-07-26: qty 0.9

## 2023-07-26 NOTE — ED Notes (Signed)
 Pt given dinner tray. Pt continued to rest after made aware dinner tray was in room.
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
## 2023-07-26 NOTE — ED Notes (Addendum)
 Pt Breakfast provided at bedside

## 2023-07-26 NOTE — ED Notes (Signed)
 IVC PENDING  CONSULT ?
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
## 2023-07-26 NOTE — BH Assessment (Incomplete)
 Patient has been accepted to [facility] pending bed availability Patient assigned to room. Accepting physician is Dr. FERNAND  Call report to [phone].  Representative was [employer].   ER Staff is aware of it:  Olam, ER Secretary  Dr. ***, ER MD  *** Patient's Nurse     Patient's Family/Support System (***) have been updated as well.  Address:
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
## 2023-07-26 NOTE — ED Notes (Signed)
 Pt given snack at this time. Pt calm and cooperative.

## 2023-07-26 NOTE — ED Notes (Signed)
 Pt lunch provided at bedside
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
## 2023-07-27 ENCOUNTER — Inpatient Hospital Stay (HOSPITAL_COMMUNITY)
Admission: AD | Admit: 2023-07-27 | Discharge: 2023-08-01 | DRG: 885 | Disposition: A | Discharge status: Home / Self Care | Source: Intra-hospital | Attending: Student in an Organized Health Care Education/Training Program | Admitting: Student in an Organized Health Care Education/Training Program

## 2023-07-27 ENCOUNTER — Other Ambulatory Visit: Payer: Self-pay

## 2023-07-27 ENCOUNTER — Encounter (HOSPITAL_COMMUNITY): Payer: Self-pay | Admitting: Psychiatry

## 2023-07-27 DIAGNOSIS — F22 Delusional disorders: Secondary | ICD-10-CM | POA: Diagnosis present

## 2023-07-27 DIAGNOSIS — F200 Paranoid schizophrenia: Principal | ICD-10-CM | POA: Diagnosis present

## 2023-07-27 DIAGNOSIS — R55 Syncope and collapse: Secondary | ICD-10-CM | POA: Diagnosis present

## 2023-07-27 DIAGNOSIS — F319 Bipolar disorder, unspecified: Secondary | ICD-10-CM | POA: Diagnosis present

## 2023-07-27 DIAGNOSIS — Z5982 Transportation insecurity: Secondary | ICD-10-CM | POA: Diagnosis not present

## 2023-07-27 DIAGNOSIS — R45851 Suicidal ideations: Secondary | ICD-10-CM | POA: Diagnosis present

## 2023-07-27 DIAGNOSIS — H5710 Ocular pain, unspecified eye: Secondary | ICD-10-CM | POA: Diagnosis present

## 2023-07-27 DIAGNOSIS — Z5948 Other specified lack of adequate food: Secondary | ICD-10-CM

## 2023-07-27 DIAGNOSIS — F1414 Cocaine abuse with cocaine-induced mood disorder: Secondary | ICD-10-CM | POA: Diagnosis present

## 2023-07-27 DIAGNOSIS — Z5941 Food insecurity: Secondary | ICD-10-CM

## 2023-07-27 DIAGNOSIS — F19950 Other psychoactive substance use, unspecified with psychoactive substance-induced psychotic disorder with delusions: Secondary | ICD-10-CM | POA: Diagnosis present

## 2023-07-27 DIAGNOSIS — S00212A Abrasion of left eyelid and periocular area, initial encounter: Secondary | ICD-10-CM | POA: Diagnosis not present

## 2023-07-27 DIAGNOSIS — F250 Schizoaffective disorder, bipolar type: Secondary | ICD-10-CM | POA: Diagnosis present

## 2023-07-27 DIAGNOSIS — F1494 Cocaine use, unspecified with cocaine-induced mood disorder: Secondary | ICD-10-CM | POA: Diagnosis present

## 2023-07-27 DIAGNOSIS — F23 Brief psychotic disorder: Principal | ICD-10-CM | POA: Diagnosis present

## 2023-07-27 LAB — URINE DRUG SCREEN, QUALITATIVE (ARMC ONLY)
Amphetamines, Ur Screen: NOT DETECTED
Barbiturates, Ur Screen: NOT DETECTED
Benzodiazepine, Ur Scrn: NOT DETECTED
Cannabinoid 50 Ng, Ur ~~LOC~~: NOT DETECTED
Cocaine Metabolite,Ur ~~LOC~~: POSITIVE — AB
MDMA (Ecstasy)Ur Screen: NOT DETECTED
Methadone Scn, Ur: NOT DETECTED
Opiate, Ur Screen: NOT DETECTED
Phencyclidine (PCP) Ur S: NOT DETECTED
Tricyclic, Ur Screen: NOT DETECTED

## 2023-07-27 LAB — CBC WITH DIFFERENTIAL/PLATELET
Abs Immature Granulocytes: 0.01 K/uL (ref 0.00–0.07)
Basophils Absolute: 0 K/uL (ref 0.0–0.1)
Basophils Relative: 0 %
Eosinophils Absolute: 0.1 K/uL (ref 0.0–0.5)
Eosinophils Relative: 2 %
HCT: 41.1 % (ref 39.0–52.0)
Hemoglobin: 13.7 g/dL (ref 13.0–17.0)
Immature Granulocytes: 0 %
Lymphocytes Relative: 40 %
Lymphs Abs: 2.2 K/uL (ref 0.7–4.0)
MCH: 29.4 pg (ref 26.0–34.0)
MCHC: 33.3 g/dL (ref 30.0–36.0)
MCV: 88.2 fL (ref 80.0–100.0)
Monocytes Absolute: 0.5 K/uL (ref 0.1–1.0)
Monocytes Relative: 9 %
Neutro Abs: 2.7 K/uL (ref 1.7–7.7)
Neutrophils Relative %: 49 %
Platelets: 195 K/uL (ref 150–400)
RBC: 4.66 MIL/uL (ref 4.22–5.81)
RDW: 13.6 % (ref 11.5–15.5)
WBC: 5.6 K/uL (ref 4.0–10.5)
nRBC: 0 % (ref 0.0–0.2)

## 2023-07-27 MED ORDER — LORAZEPAM 2 MG/ML IJ SOLN: 2.0000 mg | Freq: Three times a day (TID) | INTRAMUSCULAR | Status: DC | PRN

## 2023-07-27 MED ORDER — ACETAMINOPHEN 325 MG PO TABS
650.0000 mg | ORAL_TABLET | Freq: Four times a day (QID) | ORAL | Status: DC | PRN
  Administered 2023-07-27 – 2023-07-31 (×7): 650 mg via ORAL
  Filled 2023-07-27 (×7): qty 2

## 2023-07-27 MED ORDER — HALOPERIDOL LACTATE 5 MG/ML IJ SOLN: 5.0000 mg | Freq: Once | INTRAMUSCULAR | Status: DC

## 2023-07-27 MED ORDER — CLOZAPINE 100 MG PO TABS: 400.0000 mg | ORAL_TABLET | Freq: Every day | ORAL | Status: DC

## 2023-07-27 MED ORDER — DIPHENHYDRAMINE HCL 50 MG/ML IJ SOLN: 50.0000 mg | Freq: Three times a day (TID) | INTRAMUSCULAR | Status: DC | PRN

## 2023-07-27 MED ORDER — DIPHENHYDRAMINE HCL 50 MG/ML IJ SOLN: 50.0000 mg | Freq: Once | INTRAMUSCULAR | Status: DC

## 2023-07-27 MED ORDER — CLOZAPINE 100 MG PO TABS
100.0000 mg | ORAL_TABLET | Freq: Every day | ORAL | Status: AC
  Administered 2023-07-27: 100 mg via ORAL
  Filled 2023-07-27: qty 1

## 2023-07-27 MED ORDER — HYDROXYZINE HCL 25 MG PO TABS
25.0000 mg | ORAL_TABLET | Freq: Four times a day (QID) | ORAL | Status: DC | PRN
  Administered 2023-07-27 – 2023-07-31 (×4): 25 mg via ORAL
  Filled 2023-07-27 (×3): qty 1
  Filled 2023-07-27: qty 10
  Filled 2023-07-27: qty 1

## 2023-07-27 MED ORDER — ACETAMINOPHEN 325 MG PO TABS
650.0000 mg | ORAL_TABLET | Freq: Once | ORAL | Status: AC
  Administered 2023-07-27: 650 mg via ORAL
  Filled 2023-07-27: qty 2

## 2023-07-27 MED ORDER — LORAZEPAM 2 MG/ML IJ SOLN: 2.0000 mg | Freq: Once | INTRAMUSCULAR | Status: DC

## 2023-07-27 MED ORDER — LITHIUM CARBONATE ER 450 MG PO TBCR
1200.0000 mg | EXTENDED_RELEASE_TABLET | Freq: Every day | ORAL | Status: DC
  Administered 2023-07-27: 1200 mg via ORAL
  Filled 2023-07-27: qty 1

## 2023-07-27 MED ORDER — MAGNESIUM HYDROXIDE 400 MG/5ML PO SUSP: 30.0000 mL | Freq: Every day | ORAL | Status: DC | PRN

## 2023-07-27 MED ORDER — ALUM & MAG HYDROXIDE-SIMETH 200-200-20 MG/5ML PO SUSP: 30.0000 mL | ORAL | Status: DC | PRN

## 2023-07-27 MED ORDER — TRAZODONE HCL 50 MG PO TABS
50.0000 mg | ORAL_TABLET | Freq: Every evening | ORAL | Status: DC | PRN
Start: 2023-07-27 — End: 2023-08-01
  Administered 2023-07-27 – 2023-07-31 (×4): 50 mg via ORAL
  Filled 2023-07-27 (×4): qty 1
  Filled 2023-07-27: qty 7

## 2023-07-27 MED ORDER — HALOPERIDOL LACTATE 5 MG/ML IJ SOLN: 5.0000 mg | Freq: Three times a day (TID) | INTRAMUSCULAR | Status: DC | PRN

## 2023-07-27 MED ORDER — CLOZAPINE 100 MG PO TABS: 200.0000 mg | ORAL_TABLET | Freq: Every day | ORAL | Status: DC

## 2023-07-27 MED ORDER — CLOZAPINE 100 MG PO TABS: 300.0000 mg | ORAL_TABLET | Freq: Every day | ORAL | Status: DC

## 2023-07-27 MED ORDER — CLOZAPINE 100 MG PO TABS: 500.0000 mg | ORAL_TABLET | Freq: Every day | ORAL | Status: DC

## 2023-07-27 NOTE — ED Notes (Signed)
 Calling report to [facility] [phone]

## 2023-07-27 NOTE — ED Notes (Signed)
 IVC/CONSULT COMPLETED/ recommend inpatient psychiatric hospitalization

## 2023-07-27 NOTE — ED Notes (Signed)
 Patient stated he thought he was voluntary. Explained to patient that he is IVC and will be going to [facility] when a bed becomes available. Patient stated he understood and would not be giving staff a problem but he was never told until now that he was IVC. Patient given emotional support by RN and NT.

## 2023-07-27 NOTE — ED Notes (Signed)
 Gave report to Prentice, Charity fundraiser at [employer]. Prentice stated to this RN that the bed may not be available until end of shift assuming around 1900. This RN's staff/TOC team stated the bed would be ready around 1530. This RN advised we would call back at 1530.
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
## 2023-07-27 NOTE — ED Notes (Signed)
 Pt given lunch at bedside

## 2023-07-27 NOTE — ED Notes (Signed)
 Called AC at [facility]- okay for transport to be called for patient to transfer

## 2023-07-27 NOTE — ED Notes (Signed)
 Patient requesting pain medication. Arlander, MD notified.

## 2023-07-27 NOTE — Tx Team (Signed)
 Initial Treatment Plan 07/27/2023 6:17 PM [name] [name] FMW:[id number]    PATIENT STRESSORS: Financial difficulties   Occupational concerns   Substance abuse   Traumatic event     PATIENT STRENGTHS: Active sense of humor  Capable of independent living  Supportive family/friends    PATIENT IDENTIFIED PROBLEMS: No acces to Rx during recent incarceration  +COC use                   DISCHARGE CRITERIA:  Ability to meet basic life and health needs Adequate post-discharge living arrangements  PRELIMINARY DISCHARGE PLAN: Return to previous living arrangement  PATIENT/FAMILY INVOLVEMENT: This treatment plan has been presented to and reviewed with the patient, [name], and/or family member.  The patient and family have been given the opportunity to ask questions and make suggestions.  Greg JINNY Angle, RN 07/27/2023, 6:17 PM

## 2023-07-27 NOTE — ED Notes (Signed)
 Requested patient to provide UA sample. Patient stated they did not have to pee at this time.

## 2023-07-27 NOTE — ED Notes (Signed)
[place]  [facility]  DEPT  CALLED  FOR  TRANSPORT  TO [facility] ?

## 2023-07-27 NOTE — ED Notes (Signed)
 Pt asked security to speak to RN. Pt requesting small snack and water - given graham crackers, peanut butter, and water. RN speaking with pt for approx 10 mins. Throughout interaction pt pleasant and responding appropriately to conversational ques.

## 2023-07-27 NOTE — ED Provider Notes (Signed)
 Emergency Medicine Observation Re-evaluation Note   BP 101/73   Pulse 73   Temp 97.8 F (36.6 C) (Oral)   Resp 18   Ht 5' 11 (1.803 m)   Wt 77.1 kg   SpO2 100%   BMI 23.71 kg/m    ED Course / MDM   No reported events during my shift at the time of this note.   Pt is awaiting dispo from consultants   Ginnie Shams MD    Shams Ginnie, MD 07/27/23 [phone]

## 2023-07-27 NOTE — ED Notes (Signed)
 IVC pending [facility] admit  when pt bed available

## 2023-07-27 NOTE — BHH Group Notes (Signed)
 BHH Group Notes:  (Nursing/MHT/Case Management/Adjunct)  Date:  07/27/2023  Time:  8:38 PM  Type of Therapy:  Wrap-up group  Participation Level:  Active  Participation Quality:  Appropriate  Affect:  Appropriate  Cognitive:  Appropriate  Insight:  Appropriate  Engagement in Group:  Engaged  Modes of Intervention:  Education  Summary of Progress/Problems: Goal to stay smiling. Rated day 8/10.  Grayce LITTIE Essex 07/27/2023, 8:38 PM
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
## 2023-07-27 NOTE — ED Notes (Signed)
EMTALA reviewed by charge

## 2023-07-28 ENCOUNTER — Encounter (HOSPITAL_COMMUNITY): Payer: Self-pay

## 2023-07-28 MED ORDER — CLOZAPINE 100 MG PO TABS: 400.0000 mg | ORAL_TABLET | Freq: Every day | ORAL | Status: DC

## 2023-07-28 MED ORDER — SENNA 8.6 MG PO TABS
2.0000 | ORAL_TABLET | Freq: Every day | ORAL | Status: DC
  Administered 2023-07-28 – 2023-07-31 (×4): 17.2 mg via ORAL
  Filled 2023-07-28 (×4): qty 2

## 2023-07-28 MED ORDER — CLOZAPINE 100 MG PO TABS: 300.0000 mg | ORAL_TABLET | Freq: Every day | ORAL | Status: DC

## 2023-07-28 MED ORDER — LITHIUM CARBONATE ER 450 MG PO TBCR
900.0000 mg | EXTENDED_RELEASE_TABLET | Freq: Every day | ORAL | Status: DC
  Administered 2023-07-28 – 2023-07-31 (×4): 900 mg via ORAL
  Filled 2023-07-28 (×3): qty 2
  Filled 2023-07-28: qty 14
  Filled 2023-07-28: qty 2

## 2023-07-28 MED ORDER — CLOZAPINE 100 MG PO TABS
200.0000 mg | ORAL_TABLET | Freq: Every day | ORAL | Status: DC
Start: 2023-07-29 — End: 2023-07-30

## 2023-07-28 MED ORDER — CLOZAPINE 100 MG PO TABS
100.0000 mg | ORAL_TABLET | Freq: Every day | ORAL | Status: AC
  Administered 2023-07-28: 100 mg via ORAL
  Filled 2023-07-28: qty 1

## 2023-07-28 NOTE — Plan of Care (Signed)
   Problem: Safety: Goal: Periods of time without injury will increase Outcome: Progressing

## 2023-07-28 NOTE — Group Note (Unsigned)
 Date:  07/28/2023 Time:  8:30 PM  Group Topic/Focus:  Wrap-Up Group:   The focus of this group is to help patients review their daily goal of treatment and discuss progress on daily workbooks.     Participation Level:  {BHH PARTICIPATION OZCZO:77735}  Participation Quality:  {BHH PARTICIPATION QUALITY:22265}  Affect:  {BHH AFFECT:22266}  Cognitive:  {BHH COGNITIVE:22267}  Insight: {BHH Insight2:20797}  Engagement in Group:  {BHH ENGAGEMENT IN HMNLE:77731}  Modes of Intervention:  {BHH MODES OF INTERVENTION:22269}  Additional Comments:  ***  Gwenn Nobie [place] 07/28/2023, 8:30 PM

## 2023-07-28 NOTE — BH IP Treatment Plan (Signed)
 Interdisciplinary Treatment and Diagnostic Plan Update  07/28/2023 Time of Session: 1043am [name] MRN: [id number]  Principal Diagnosis: Schizophrenia, paranoid (HCC)  Secondary Diagnoses: Principal Problem:   Schizophrenia, paranoid (HCC) Active Problems:   Cocaine abuse with cocaine-induced mood disorder (HCC)   Schizoaffective disorder, bipolar type (HCC)   Schizophrenia, acute (HCC)   Current Medications:  Current Facility-Administered Medications  Medication Dose Route Frequency Provider Last Rate Last Admin   acetaminophen  (TYLENOL ) tablet 650 mg  650 mg Oral Q6H PRN Jadapalle, Sree, MD   650 mg at 07/27/23 2046   alum & mag hydroxide-simeth (MAALOX/MYLANTA) 200-200-20 MG/5ML suspension 30 mL  30 mL Oral Q4H PRN Jadapalle, Sree, MD       cloZAPine  (CLOZARIL ) tablet 200 mg  200 mg Oral QHS Jadapalle, Sree, MD       Followed by   NOREEN ON 07/29/2023] cloZAPine  (CLOZARIL ) tablet 300 mg  300 mg Oral QHS Jadapalle, Sree, MD       Followed by   NOREEN ON 07/30/2023] cloZAPine  (CLOZARIL ) tablet 400 mg  400 mg Oral QHS Jadapalle, Sree, MD       Followed by   NOREEN ON 07/31/2023] cloZAPine  (CLOZARIL ) tablet 500 mg  500 mg Oral QHS Jadapalle, Sree, MD       haloperidol  lactate (HALDOL ) injection 5 mg  5 mg Intramuscular TID PRN Jadapalle, Sree, MD       And   diphenhydrAMINE  (BENADRYL ) injection 50 mg  50 mg Intramuscular TID PRN Jadapalle, Sree, MD       And   LORazepam  (ATIVAN ) injection 2 mg  2 mg Intramuscular TID PRN Jadapalle, Sree, MD       hydrOXYzine  (ATARAX ) tablet 25 mg  25 mg Oral Q6H PRN Jadapalle, Sree, MD   25 mg at 07/27/23 2046   lithium  carbonate (ESKALITH ) ER tablet 900 mg  900 mg Oral QHS Izediuno, Jerrell LABOR, MD       magnesium  hydroxide (MILK OF MAGNESIA) suspension 30 mL  30 mL Oral Daily PRN Donnelly Mellow, MD       traZODone  (DESYREL ) tablet 50 mg  50 mg Oral QHS PRN Jadapalle, Sree, MD   50 mg at 07/27/23 2046   PTA Medications: Medications Prior to  Admission  Medication Sig Dispense Refill Last Dose/Taking   cloZAPine  (CLOZARIL ) 200 MG tablet Take 2.5 tablets (500 mg total) by mouth at bedtime. 75 tablet 0    lithium  carbonate (LITHOBID ) 300 MG ER tablet Take 4 tablets (1,200 mg total) by mouth at bedtime. 120 tablet 0    senna (SENOKOT) 8.6 MG TABS tablet Take 2 tablets (17.2 mg total) by mouth at bedtime. 120 tablet 0     Patient Stressors: Financial difficulties   Occupational concerns   Substance abuse   Traumatic event    Patient Strengths: Active sense of humor  Capable of independent living  Supportive family/friends   Treatment Modalities: Medication Management, Group therapy, Case management,  1 to 1 session with clinician, Psychoeducation, Recreational therapy.   Physician Treatment Plan for Primary Diagnosis: Schizophrenia, paranoid (HCC) Long Term Goal(s): Improvement in symptoms so as ready for discharge   Short Term Goals: Ability to identify and develop effective coping behaviors will improve Ability to maintain clinical measurements within normal limits will improve Compliance with prescribed medications will improve Ability to identify triggers associated with substance abuse/mental health issues will improve Ability to identify changes in lifestyle to reduce recurrence of condition will improve Ability to verbalize feelings will improve Ability  to disclose and discuss suicidal ideas Ability to demonstrate self-control will improve  Medication Management: Evaluate patient's response, side effects, and tolerance of medication regimen.  Therapeutic Interventions: 1 to 1 sessions, Unit Group sessions and Medication administration.  Evaluation of Outcomes: Not Progressing  Physician Treatment Plan for Secondary Diagnosis: Principal Problem:   Schizophrenia, paranoid (HCC) Active Problems:   Cocaine abuse with cocaine-induced mood disorder (HCC)   Schizoaffective disorder, bipolar type (HCC)    Schizophrenia, acute (HCC)  Long Term Goal(s): Improvement in symptoms so as ready for discharge   Short Term Goals: Ability to identify and develop effective coping behaviors will improve Ability to maintain clinical measurements within normal limits will improve Compliance with prescribed medications will improve Ability to identify triggers associated with substance abuse/mental health issues will improve Ability to identify changes in lifestyle to reduce recurrence of condition will improve Ability to verbalize feelings will improve Ability to disclose and discuss suicidal ideas Ability to demonstrate self-control will improve     Medication Management: Evaluate patient's response, side effects, and tolerance of medication regimen.  Therapeutic Interventions: 1 to 1 sessions, Unit Group sessions and Medication administration.  Evaluation of Outcomes: Not Progressing   RN Treatment Plan for Primary Diagnosis: Schizophrenia, paranoid (HCC) Long Term Goal(s): Knowledge of disease and therapeutic regimen to maintain health will improve  Short Term Goals: Ability to remain free from injury will improve, Ability to verbalize frustration and anger appropriately will improve, Ability to demonstrate self-control, Ability to participate in decision making will improve, Ability to verbalize feelings will improve, Ability to disclose and discuss suicidal ideas, Ability to identify and develop effective coping behaviors will improve, and Compliance with prescribed medications will improve  Medication Management: RN will administer medications as ordered by provider, will assess and evaluate patient's response and provide education to patient for prescribed medication. RN will report any adverse and/or side effects to prescribing provider.  Therapeutic Interventions: 1 on 1 counseling sessions, Psychoeducation, Medication administration, Evaluate responses to treatment, Monitor vital signs and CBGs  as ordered, Perform/monitor CIWA, COWS, AIMS and Fall Risk screenings as ordered, Perform wound care treatments as ordered.  Evaluation of Outcomes: Not Progressing   LCSW Treatment Plan for Primary Diagnosis: Schizophrenia, paranoid (HCC) Long Term Goal(s): Safe transition to appropriate next level of care at discharge, Engage patient in therapeutic group addressing interpersonal concerns.  Short Term Goals: Engage patient in aftercare planning with referrals and resources, Increase social support, Increase ability to appropriately verbalize feelings, Increase emotional regulation, Facilitate acceptance of mental health diagnosis and concerns, Facilitate patient progression through stages of change regarding substance use diagnoses and concerns, Identify triggers associated with mental health/substance abuse issues, and Increase skills for wellness and recovery  Therapeutic Interventions: Assess for all discharge needs, 1 to 1 time with Social worker, Explore available resources and support systems, Assess for adequacy in community support network, Educate family and significant other(s) on suicide prevention, Complete Psychosocial Assessment, Interpersonal group therapy.  Evaluation of Outcomes: Not Progressing   Progress in Treatment: Attending groups: Yes. Participating in groups: Yes. Taking medication as prescribed: Yes. Toleration medication: Yes. Family/Significant other contact made: No, will contact:  consents pending Patient understands diagnosis: Yes. Discussing patient identified problems/goals with staff: Yes. Medical problems stabilized or resolved: Yes. Denies suicidal/homicidal ideation: Yes. Issues/concerns per patient self-inventory: No.  New problem(s) identified: No, Describe:  none  New Short Term/Long Term Goal(s): medication management for mood stabilization; elimination of SI thoughts; development of comprehensive mental wellness/sobriety plan  Patient Goals:   Be positive, think before acting, get back on my medications, maybe go to treatment  Discharge Plan or Barriers: Patient recently admitted. CSW will continue to follow and assess for appropriate referrals and possible discharge planning.    Reason for Continuation of Hospitalization: Medication stabilization Suicidal ideation  Estimated Length of Stay: 5-7 days  Last 3 [place] Suicide Severity Risk Score: Flowsheet Row [facility] ([facility] 07[facility]26/2023 in [facility] to Hosp-Admission (Discharged) [facility]  C-SSRS RISK CATEGORY No Risk High Risk No Risk    Last PHQ 2/9 Scores:     No data to display          Scribe for Treatment Team: Jenkins LULLA Primer, LCSWA 07/28/2023 11:08 AM

## 2023-07-28 NOTE — BHH Suicide Risk Assessment (Signed)
 Suicide Risk Assessment  Admission Assessment    [facility] Admission Suicide Risk Assessment   Nursing information obtained from:  Patient  Demographic factors:  Male, Low socioeconomic status  Current Mental Status:  Suicidal ideation indicated by others  Loss Factors:  Financial problems / change in socioeconomic status  Historical Factors:  Impulsivity  Risk Reduction Factors:  Sense of responsibility to family, Positive social support  Total Time spent with patient: 1.5 hours including H&P.  Principal Problem: Schizophrenia, paranoid (HCC)  Diagnosis:  Principal Problem:   Schizophrenia, paranoid (HCC) Active Problems:   Cocaine abuse with cocaine-induced mood disorder (HCC)   Schizoaffective disorder, bipolar type (HCC)   Schizophrenia, acute (HCC)  Subjective Data: See H&P.  Continued Clinical Symptoms:  Alcohol Use Disorder Identification Test Final Score (AUDIT): 0 The Alcohol Use Disorders Identification Test, Guidelines for Use in Primary Care, Second Edition.  World Science writer [facility]). Score between 0-7:  no or low risk or alcohol related problems. Score between 8-15:  moderate risk of alcohol related problems. Score between 16-19:  high risk of alcohol related problems. Score 20 or above:  warrants further diagnostic evaluation for alcohol dependence and treatment.  CLINICAL FACTORS:   Alcohol/Substance Abuse/Dependencies More than one psychiatric diagnosis Unstable or Poor Therapeutic Relationship Previous Psychiatric Diagnoses and Treatments  Musculoskeletal: Strength & Muscle Tone: within normal limits Gait & Station: normal Patient leans: N/A  Psychiatric Specialty Exam:  Presentation  General Appearance:  Casual  Eye Contact: Fair  Speech: Slow  Speech Volume: Decreased  Handedness: Right  Mood and Affect  Mood: Anxious; Depressed  Affect: Flat  Thought Process  Thought Processes: Other (comment); Disorganized  (Scattered.)  Descriptions of Associations:Tangential  Orientation:Partial  Thought Content:Scattered; Tangential  History of Schizophrenia/Schizoaffective disorder:Yes  Duration of Psychotic Symptoms:Greater than six months  Hallucinations:Hallucinations: None  Ideas of Reference:None  Suicidal Thoughts:Suicidal Thoughts: Yes, Passive SI Passive Intent and/or Plan: Without Intent; Without Plan; Without Means to Carry Out; Without Access to Means  Homicidal Thoughts:Homicidal Thoughts: No   Sensorium  Memory: Recent Poor; Immediate Fair; Remote Poor  Judgment: Impaired  Insight: Shallow  Executive Functions  Concentration: Fair  Attention Span: Fair  Recall: Poor  Fund of Knowledge: Poor  Language: Fair  Psychomotor Activity  Psychomotor Activity:Psychomotor Activity: Decreased   Assets  Assets: Desire for Improvement; Housing; Social Support  Sleep  Sleep:Sleep: Fair Number of Hours of Sleep: 5.5   Physical/Ros Exam: See H&P.  Blood pressure 112/84, pulse 93, temperature 98.5 F (36.9 C), temperature source Oral, resp. rate 18, height 5' 11 (1.803 m), weight 69.3 kg, SpO2 100%. Body mass index is 21.31 kg/m.  COGNITIVE FEATURES THAT CONTRIBUTE TO RISK:  Loss of executive function and Thought constriction (tunnel vision)    SUICIDE RISK:   Severe:  Frequent, intense, and enduring suicidal ideation, specific plan, no subjective intent, but some objective markers of intent (i.e., choice of lethal method), the method is accessible, some limited preparatory behavior, evidence of impaired self-control, severe dysphoria/symptomatology, multiple risk factors present, and few if any protective factors, particularly a lack of social support.  PLAN OF CARE: See H&P.  I certify that inpatient services furnished can reasonably be expected to improve the patient's condition.   Mac Bolster, NP, pmhnp, fnp-bc. 07/28/2023, 11:17 AM

## 2023-07-28 NOTE — Plan of Care (Signed)
  Problem: Coping: Goal: Ability to verbalize frustrations and anger appropriately will improve Outcome: Progressing   Problem: Activity: Goal: Interest or engagement in activities will improve Outcome: Progressing   Problem: Physical Regulation: Goal: Ability to maintain clinical measurements within normal limits will improve Outcome: Progressing   Problem: Safety: Goal: Periods of time without injury will increase Outcome: Progressing

## 2023-07-28 NOTE — Group Note (Signed)
 Recreation Therapy Group Note   Group Topic:Anger Management  Group Date: 07/28/2023 Start Time: 1028 End Time: 1055 Facilitators: Jamol Ginyard-McCall, LRT,CTRS Location: [address] Dayroom   Group Topic: Anger Management  Goal Area(s) Addresses:  Patient will rate their anger.  Patient will identify actions they take at certain levels of anger.  Patient will identify coping skills for anger.   Behavioral Response:   Intervention: Conversation with group, and worksheets  Activity: Anger Thermometer. LRT and patients discussed anger and some things that can cause someone to be angry. Patients were then given and worksheet with a thermometer number 1-5. Each number had a different level of anger (1-slightly irritated, 2-irritated, 3-angry, 4-really angry and 5-enraged). Patients had to identify what behaviors they show at each level of anger. Patients were to then identify 5 coping skills they could use to help with anger.   Education: Anger Management, Discharge Planning   Education Outcome: Acknowledges education/In group clarification offered/Needs additional education.   Affect/Mood: N/A   Participation Level: Did not attend    Clinical Observations/Individualized Feedback:    Plan: Continue to engage patient in RT group sessions 2-3x/week.   Yi Haugan-McCall, LRT,CTRS 07/28/2023 12:14 PM

## 2023-07-28 NOTE — H&P (Signed)
 Psychiatric Admission Assessment Adult  Patient Identification: [name]  MRN:  [id number]  Date of Evaluation:  07/28/2023  Chief Complaint: Worsening suicidal ideations with plan to jump into traffic, jump off a bridge or hit his head.  Principal Diagnosis: Schizophrenia, paranoid (HCC)  Diagnosis:  Principal Problem:   Schizophrenia, paranoid (HCC) Active Problems:   Cocaine abuse with cocaine-induced mood disorder (HCC)   Schizoaffective disorder, bipolar type (HCC)   Schizophrenia, acute (HCC)  History of Present Illness: This is one of several psychiatric admission assessments in this [place] system for this 33 year old AA male with hx of paranoia schizophrenia, schizoaffective disorder, bipolar-type & stimulant (cocaine) use disorder. Admitted to the [facility] from [facility] with complaint of worsening suicidal ideations with plan to jump into traffic, jump off a bridge or hit his head. Per chart review, patient had apparently gone to the [facility] with complaint of eye pain & suicidal ideations. While being evaluated at the ER, patient did report an interest in getting back to his mental health medications. After medical evaluation & stabilization, patient was transferred to the [facility] for further psychiatric evaluation/treatments. Patient was admitted & treated in this [facility] last may, 2025. Per chart review, most of his psychiatric admissions/treatments was at the [facility]. Patient's chart & current lab results reviewed. During this evaluation, Trebor reports,   I don't remember a lot of things right now. I don't remember when I got to the [facility]. I guess I was going through some situation. I was feeling suicidal then, but I'm not any more. I just need you guys to make sure that I'm okay. I was off my medicines for a couple of weeks. I'm trying to get back on them. I don't remember the names of my medicines or why I take them. I'm not really depressed, I just want to be  okay.  Objective: During this evaluation, patient is lying down in bed. He is falling in & out of sleep. He is making a brief fair eye contact. He is verbally responsive, but not making much statements. He presents as a poor historian at this time. However, he does not appear to be in any apparent distress. Vital signs are with norm. See the treatment plan below. This case was discussed with the treatment team during the team meeting this afternoon.  Associated Signs/Symptoms:  Depression Symptoms:  fatigue, difficulty concentrating, suicidal thoughts without plan,  (Hypo) Manic Symptoms:  Labiality of Mood,  Anxiety Symptoms:  Excessive Worry,  Psychotic Symptoms:  Patient currently denies any SIHI, AVH, delusional thoughts or paranoia.  PTSD Symptoms:  NA  Total Time spent with patient: 1.5 hours  Past Psychiatric History: (Per chart review): Patient has a history of schizoaffective or schizophrenia disorder and a history of cocaine abuse.  Has been in the hospital before and been in the ER even since then.  He was referred for outpatient follow-up last time and was on appropriate medication.  He cannot remember the medicines and it is unclear how much he ever followed up.  Clear history of cocaine abuse which has complicated any assessment of his other mental health problems.  Suicide attempts have been passive dangerous behaviors such as today.   Is the patient at risk to self? Yes.  (Reports passive suicidal ideations). Has the patient been a risk to self in the past 6 months? Yes.    Has the patient been a risk to self within the distant past? Yes.  Is the patient a risk to others? No.  Has the patient been a risk to others in the past 6 months? No.  Has the patient been a risk to others within the distant past? No.   [place] Scale:  Flowsheet Row [facility]) [facility] ED to Hosp-Admission (Discharged) from [facility]  C-SSRS RISK CATEGORY No Risk High Risk No Risk   Prior Inpatient Therapy: Yes.   If yes, describe: [facility], in May of 2025, [facility] x numerous times.   Prior Outpatient Therapy: Yes.   If yes, describe: Patient is unable to provide this information at this time.   Alcohol Screening: 1. How often do you have a drink containing alcohol?: Never 2. How many drinks containing alcohol do you have on a typical day when you are drinking?: 1 or 2 3. How often do you have six or more drinks on one occasion?: Never AUDIT-C Score: 0 4. How often during the last year have you found that you were not able to stop drinking once you had started?: Never 5. How often during the last year have you failed to do what was normally expected from you because of drinking?: Never 6. How often during the last year have you needed a first drink in the morning to get yourself going after a heavy drinking session?: Never 7. How often during the last year have you had a feeling of guilt of remorse after drinking?: Never 8. How often during the last year have you been unable to remember what happened the night before because you had been drinking?: Never 9. Have you or someone else been injured as a result of your drinking?: No 10. Has a relative or friend or a doctor or another health worker been concerned about your drinking or suggested you cut down?: No Alcohol Use Disorder Identification Test Final Score (AUDIT): 0 Alcohol Brief Interventions/Follow-up: Alcohol education/Brief advice  Substance Abuse History in the last 12 months:  Yes.    Consequences of Substance Abuse: Discussed with patient during this admission evaluation. Medical Consequences:  Liver damage, Possible death by overdose Legal Consequences:  Arrests, jail time, Loss of driving privilege. Family Consequences:  Family discord,  divorce and or separation.  Previous Psychotropic Medications: Yes   Psychological Evaluations: No   Past Medical History:  Past Medical History:  Diagnosis Date   B12 deficiency 05/27/2023   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 No pertinent surgical history.  Family History: History reviewed. No pertinent family history.  Family Psychiatric  History: No information provided by this patient about his family psychiatric hx.  Tobacco Screening: Yes I smoke. Social History   Tobacco Use  Smoking Status Never  Smokeless Tobacco Never    BH Tobacco Counseling     Are you interested in Tobacco Cessation Medications?  No, patient refused Counseled patient on smoking cessation:  N/A, patient does not use tobacco products Reason Tobacco Screening Not Completed: No value filed.       Social History: Patient did not provide any information about his social hx. He presents as a poor historian. Social History   Substance and Sexual Activity  Alcohol Use Not Currently     Social History   Substance and Sexual Activity  Drug Use Yes   Types:  Cocaine, Marijuana   Comment: crack 06-26-19    Additional Social History:  Allergies:   Allergies  Allergen Reactions   Ibuprofen Swelling    Tongue swelling   Risperidone And Paliperidone Other (See Comments) and Swelling    gynecomastia gynecomastia    Ziprasidone  Swelling    Tongue swells   Benztropine  Other (See Comments)    Causes confusion, depression, and delusions   Quetiapine  Other (See Comments)    Depression, suicidality, adverse effect: seizures    Lab Results:  Results for orders placed or performed during the hospital encounter of 07/26/23 (from the past 48 hours)  CBC with Differential/Platelet     Status: None   Collection Time: 07/27/23  5:59 AM  Result Value Ref Range   WBC 5.6 4.0 - 10.5 K/uL   RBC 4.66 4.22 - 5.81 MIL/uL    Hemoglobin 13.7 13.0 - 17.0 g/dL   HCT 58.8 60.9 - 47.9 %   MCV 88.2 80.0 - 100.0 fL   MCH 29.4 26.0 - 34.0 pg   MCHC 33.3 30.0 - 36.0 g/dL   RDW 86.3 88.4 - 84.4 %   Platelets 195 150 - 400 K/uL   nRBC 0.0 0.0 - 0.2 %   Neutrophils Relative % 49 %   Neutro Abs 2.7 1.7 - 7.7 K/uL   Lymphocytes Relative 40 %   Lymphs Abs 2.2 0.7 - 4.0 K/uL   Monocytes Relative 9 %   Monocytes Absolute 0.5 0.1 - 1.0 K/uL   Eosinophils Relative 2 %   Eosinophils Absolute 0.1 0.0 - 0.5 K/uL   Basophils Relative 0 %   Basophils Absolute 0.0 0.0 - 0.1 K/uL   Immature Granulocytes 0 %   Abs Immature Granulocytes 0.01 0.00 - 0.07 K/uL    Comment: Performed at [facility], [address] [place], [place] [address]   Blood Alcohol level:  Lab Results  Component Value Date   [facility] <15 07/26/2023   ETH <15 05/24/2023   Metabolic Disorder Labs:  No results found for: HGBA1C, MPG No results found for: PROLACTIN Lab Results  Component Value Date   CHOL 121 11/30/2018   TRIG 102 11/30/2018   HDL 39 (L) 11/30/2018   CHOLHDL 3.1 11/30/2018   VLDL 20 11/30/2018   LDLCALC 62 11/30/2018   Current Medications: Current Facility-Administered Medications  Medication Dose Route Frequency Provider Last Rate Last Admin   acetaminophen  (TYLENOL ) tablet 650 mg  650 mg Oral Q6H PRN Jadapalle, Sree, MD   650 mg at 07/27/23 2046   alum & mag hydroxide-simeth (MAALOX/MYLANTA) 200-200-20 MG/5ML suspension 30 mL  30 mL Oral Q4H PRN Donnelly Mellow, MD       cloZAPine  (CLOZARIL ) tablet 200 mg  200 mg Oral QHS Jadapalle, Sree, MD       Followed by   NOREEN ON 07/29/2023] cloZAPine  (CLOZARIL ) tablet 300 mg  300 mg Oral QHS Jadapalle, Sree, MD       Followed by   NOREEN ON 07/30/2023] cloZAPine  (CLOZARIL ) tablet 400 mg  400 mg Oral QHS Jadapalle, Sree, MD       Followed by   NOREEN ON 07/31/2023] cloZAPine  (CLOZARIL ) tablet 500 mg  500 mg Oral QHS Jadapalle, Sree, MD       haloperidol  lactate (HALDOL )  injection 5 mg  5 mg Intramuscular TID PRN Jadapalle, Sree, MD       And   diphenhydrAMINE  (BENADRYL ) injection 50 mg  50 mg Intramuscular TID PRN Donnelly Mellow, MD  And   LORazepam  (ATIVAN ) injection 2 mg  2 mg Intramuscular TID PRN Jadapalle, Sree, MD       hydrOXYzine  (ATARAX ) tablet 25 mg  25 mg Oral Q6H PRN Jadapalle, Sree, MD   25 mg at 07/27/23 2046   lithium  carbonate (ESKALITH ) ER tablet 900 mg  900 mg Oral QHS Izediuno, Jerrell LABOR, MD       magnesium  hydroxide (MILK OF MAGNESIA) suspension 30 mL  30 mL Oral Daily PRN Donnelly Mellow, MD       traZODone  (DESYREL ) tablet 50 mg  50 mg Oral QHS PRN Jadapalle, Sree, MD   50 mg at 07/27/23 2046   PTA Medications: Medications Prior to Admission  Medication Sig Dispense Refill Last Dose/Taking   cloZAPine  (CLOZARIL ) 200 MG tablet Take 2.5 tablets (500 mg total) by mouth at bedtime. 75 tablet 0    lithium  carbonate (LITHOBID ) 300 MG ER tablet Take 4 tablets (1,200 mg total) by mouth at bedtime. 120 tablet 0    senna (SENOKOT) 8.6 MG TABS tablet Take 2 tablets (17.2 mg total) by mouth at bedtime. 120 tablet 0    AIMS:  ,  ,  ,  ,  ,  ,    Musculoskeletal: Strength & Muscle Tone: within normal limits Gait & Station: normal Patient leans: N/A  Psychiatric Specialty Exam:  Presentation  General Appearance:  Casual  Eye Contact: Fair  Speech: Slow  Speech Volume: Decreased  Handedness: Right   Mood and Affect  Mood: Anxious; Depressed  Affect: Flat  Thought Process  Thought Processes: Other (comment); Disorganized (Scattered.)  Duration of Psychotic Symptoms:N/A  Past Diagnosis of Schizophrenia or Psychoactive disorder: Yes  Descriptions of Associations:Tangential  Orientation:Partial  Thought Content:Scattered; Tangential  Hallucinations:Hallucinations: None  Ideas of Reference:None  Suicidal Thoughts:Suicidal Thoughts: Yes, Passive SI Passive Intent and/or Plan: Without Intent; Without Plan;  Without Means to Carry Out; Without Access to Means  Homicidal Thoughts:Homicidal Thoughts: No  Sensorium  Memory: Recent Poor; Immediate Fair; Remote Poor  Judgment: Impaired  Insight: Shallow  Executive Functions  Concentration: Fair  Attention Span: Fair  Recall: Poor  Fund of Knowledge: Poor  Language: Fair  Psychomotor Activity  Psychomotor Activity:Psychomotor Activity: Decreased  Assets  Assets: Desire for Improvement; Housing; Social Support  Sleep  Sleep:Sleep: Fair  Estimated Sleeping Duration (Last 24 Hours): 4.75-5.50 hours  Physical Exam: Physical Exam Vitals and nursing note reviewed.  HENT:     Head: Normocephalic.     Nose: Nose normal.     Mouth/Throat:     Pharynx: Oropharynx is clear.  Cardiovascular:     Rate and Rhythm: Normal rate.     Pulses: Normal pulses.  Pulmonary:     Effort: Pulmonary effort is normal.  Genitourinary:    Comments: Deferred. Musculoskeletal:        General: Normal range of motion.     Cervical back: Normal range of motion.  Skin:    General: Skin is dry.  Neurological:     General: No focal deficit present.     Mental Status: He is alert and oriented to person, place, and time.    Review of Systems  Constitutional:  Negative for chills, diaphoresis and fever.  HENT:  Negative for congestion and sore throat.   Respiratory:  Negative for cough, shortness of breath and wheezing.   Cardiovascular:  Negative for chest pain and palpitations.  Gastrointestinal:  Negative for abdominal pain, constipation, diarrhea, heartburn, nausea and vomiting.  Genitourinary:  Negative for  dysuria.  Musculoskeletal:  Negative for joint pain and myalgias.  Neurological:  Negative for dizziness, tingling, tremors, sensory change, speech change, focal weakness, seizures, loss of consciousness, weakness and headaches.  Endo/Heme/Allergies:        Allergies include:  Ibuprofen  Risperidone   Paliperidone   Benztropine   Quetiapine .       Psychiatric/Behavioral:  Positive for substance abuse (UDS (+) for THC.) and suicidal ideas. Negative for memory loss. The patient is nervous/anxious and has insomnia.    Blood pressure 112/84, pulse 93, temperature 98.5 F (36.9 C), temperature source Oral, resp. rate 18, height 5' 11 (1.803 m), weight 69.3 kg, SpO2 100%. Body mass index is 21.31 kg/m.  Treatment Plan Summary: Daily contact with patient to assess and evaluate symptoms and progress in treatment and Medication management.   Principal/active diagnoses.  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 The patient consents to medication trial.   -Clozaril  100 mg po Q bedtime on 07-28-23 for mood control.  -Clozaril  200 mg po Q bedtime on 07-29-23 for mood control.  -Clozaril  300 mg po Q bedtime on 07-30-23 for mood control.  -Continue Clozaril  400 mg po Q bedtime for mood control.  -Continue Lithium  carbonate ER 900 mg po Q bedtime for mood control. -Continue Hydroxyzine  25 mg po tid prn for anxiety.   -Continue Trazodone  50 mg po Q hs prn for insomnia.   Agitation protocols.  Continue as recommended (See MAR).   Other PRNS -Continue Tylenol  650 mg every 6 hours PRN for mild pain -Continue Maalox 30 ml Q 4 hrs PRN for indigestion -Continue MOM 30 ml po Q 6 hrs for constipation  Safety and Monitoring: Voluntary admission to inpatient psychiatric unit for safety, stabilization and treatment Daily contact with patient to assess and evaluate symptoms and progress in treatment Patient's case to be discussed in multi-disciplinary team meeting Observation Level : q15 minute checks Vital signs: q12  hours Precautions: Safety  Discharge Planning: Social work and case management to assist with discharge planning and identification of hospital follow-up needs prior to discharge Estimated LOS: 5-7 days Discharge Concerns: Need to establish a safety plan; Medication compliance and effectiveness Discharge Goals: Return home with outpatient referrals for mental health follow-up including medication management/psychotherapy  Observation Level/Precautions:  15 minute checks  Laboratory:  Per ED, current lab results reviewed.  Psychotherapy: Enrolled in the group sessions.   Medications: See MAR.  Consultations: As needed.   Discharge Concerns: safety, mood stability.  Estimated LOS: 7 days.  Other: NA     Physician Treatment Plan for Primary Diagnosis: Schizophrenia, paranoid (HCC)  Long Term Goal(s): Improvement in symptoms so as ready for discharge  Short Term Goals: Ability to identify changes in lifestyle to reduce recurrence of condition will improve, Ability to verbalize feelings will improve, Ability to disclose and discuss suicidal ideas, and Ability to demonstrate self-control will improve  Physician Treatment Plan for Secondary Diagnosis: Principal Problem:   Schizophrenia, paranoid (HCC) Active Problems:   Cocaine abuse with cocaine-induced mood disorder (HCC)   Schizoaffective disorder, bipolar type (HCC)   Schizophrenia, acute (HCC)  Long Term Goal(s): Improvement in symptoms so as ready for discharge  Short Term Goals: Ability to identify and develop effective coping behaviors will improve, Ability to maintain clinical measurements within normal  limits will improve, Compliance with prescribed medications will improve, and Ability to identify triggers associated with substance abuse/mental health issues will improve  I certify that inpatient services furnished can reasonably be expected to improve the patient's condition.    Mac Bolster, NP, pmhnp,  fnp-bc. 7/16/202511:28 AM

## 2023-07-28 NOTE — Group Note (Signed)
 Date:  07/28/2023 Time:  2:08 PM  Group Topic/Focus:  Goals Group:   The focus of this group is to help patients establish daily goals to achieve during treatment and discuss how the patient can incorporate goal setting into their daily lives to aide in recovery. Orientation:   The focus of this group is to educate the patient on the purpose and policies of crisis stabilization and provide a format to answer questions about their admission.  The group details unit policies and expectations of patients while admitted.    Participation Level:  Did Not Attend   [name] Molly 07/28/2023, 2:08 PM

## 2023-07-28 NOTE — Progress Notes (Signed)
   07/28/23 2200  Psych Admission Type (Psych Patients Only)  Admission Status Involuntary  Psychosocial Assessment  Patient Complaints Anxiety  Eye Contact Fair  Facial Expression Animated  Affect Anxious  Speech Logical/coherent  Interaction Assertive  Motor Activity Fidgety  Appearance/Hygiene Unremarkable  Behavior Characteristics Cooperative  Mood Anxious  Thought Process  Coherency WDL  Content WDL  Delusions None reported or observed  Perception WDL  Hallucination None reported or observed  Judgment Poor  Confusion None  Danger to Self  Current suicidal ideation? Denies  Self-Injurious Behavior No self-injurious ideation or behavior indicators observed or expressed   Agreement Not to Harm Self Yes  Description of Agreement Verbal  Danger to Others  Danger to Others None reported or observed

## 2023-07-28 NOTE — Progress Notes (Signed)
 Recreation Therapy Notes  INPATIENT RECREATION THERAPY ASSESSMENT  Patient Details Name: [name] MRN: [id number] DOB: 03-01-1990 Today's Date: 07/28/2023       Information Obtained From: Patient  Able to Participate in Assessment/Interview: Yes  Patient Presentation: Alert  Reason for Admission (Per Patient): Med Non-Compliance  Patient Stressors: Other (Comment) (life)  Coping Skills:   Isolation, Self-Injury, TV, Sports, Exercise, Music, Deep Breathing, Meditate, Substance Abuse, Talk, Prayer, Avoidance, Read, Dance, Hot Bath/Shower  Leisure Interests (2+):  Music - Listen, Individual - TV, Individual - Other (Comment) (Lay down; Eat)  Frequency of Recreation/Participation: Other (Comment) (Daily)  Awareness of Community Resources:  Yes  Community Resources:  [place], [place], Research scientist (physical sciences), Public affairs consultant  Current Use: Yes  If no, Barriers?:    Expressed Interest in [employer] Information: No  [employer] of Residence:  Film/video editor  Patient Main Form of Transportation: Other (Comment) (mom)  Patient Strengths:  Singing, Rapping, Listening, Discerning, Make others happy  Patient Identified Areas of Improvement:  Attitude, Drugs  Patient Goal for Hospitalization:  get out, not no BS, listen  Current SI (including self-harm):  No  Current HI:  No  Current AVH: No  Staff Intervention Plan: Group Attendance, Collaborate with Interdisciplinary Treatment Team  Consent to Intern Participation: N/A   Glynna Failla-McCall, LRT,CTRS [name] 07/28/2023, 1:47 PM

## 2023-07-28 NOTE — BHH Counselor (Signed)
 Adult Comprehensive Assessment  Patient ID: [name], male   DOB: 1991-01-01, 33 y.o.   MRN: [id number]  Information Source: Information source: Patient  Current Stressors:  Patient states their primary concerns and needs for treatment are:: I need to get back on my medications. Patient states their goals for this hospitilization and ongoing recovery are:: i want to work on my medications. Educational / Learning stressors: no Employment / Job issues: no Family Relationships: no Surveyor, quantity / Lack of resources (include bankruptcy): no Housing / Lack of housing: no Physical health (include injuries & life threatening diseases): no Social relationships: yes Substance abuse: no Bereavement / Loss: no  Living/Environment/Situation:  Living Arrangements: Parent Living conditions (as described by patient or guardian): good Who else lives in the home?: I live with my mom.  Just me and her. How long has patient lived in current situation?: a while What is atmosphere in current home: Comfortable, Supportive, Loving  Family History:  Marital status: Single Are you sexually active?: Yes What is your sexual orientation?: heterosexual Has your sexual activity been affected by drugs, alcohol, medication, or emotional stress?: no Does patient have children?: Yes How many children?: 1 How is patient's relationship with their children?: I have a 32 y/o daughter.  When asked about their relationship, he responded, it's good.  Childhood History:  By whom was/is the patient raised?: Adoptive parents Additional childhood history information: According to the information in the file, patient reported that he was raised by his adoptive mother.  He had contact with his biological parents when growing up, and maintained a good relationship with them. Description of patient's relationship with caregiver when they were a child: I don't feel comfotable answering this  question. Patient's description of current relationship with people who raised him/her: Good.  Answer every question good. How were you disciplined when you got in trouble as a child/adolescent?: I was spanked Does patient have siblings?: Yes Number of Siblings: 6 Description of patient's current relationship with siblings: According to the information in the file, patient has siblings from his biological and adoptive family.  Patient has an older sister with whom he has a good relationship. Did patient suffer any verbal/emotional/physical/sexual abuse as a child?: No Did patient suffer from severe childhood neglect?: No Has patient ever been sexually abused/assaulted/raped as an adolescent or adult?: No Was the patient ever a victim of a crime or a disaster?: No Witnessed domestic violence?: No Has patient been affected by domestic violence as an adult?: No  Education:  Highest grade of school patient has completed: I completed the 9th grade. Currently a student?: No Learning disability?: No  Employment/Work Situation:   Employment Situation: Employed Where is Patient Currently Employed?: I'm working at a [employer]. How Long has Patient Been Employed?: On and off for 2/12 - 3 months, since I left the hospital. Are You Satisfied With Your Job?: Yes Do You Work More Than One Job?: No Work Stressors: no Patient's Job has Been Impacted by Current Illness: Yes Describe how Patient's Job has Been Impacted: not mine though What is the Longest Time Patient has Held a Job?: almost a year and a half Where was the Patient Employed at that Time?: [employer] Has Patient ever Been in the [employer]?: No  Financial Resources:   Surveyor, quantity resources: Income from employment Does patient have a representative payee or guardian?: No  Alcohol/Substance Abuse:   What has been your use of drugs/alcohol within the last 12 months?: no If attempted  suicide, did  drugs/alcohol play a role in this?: No Alcohol/Substance Abuse Treatment Hx: Past Tx, Inpatient If yes, describe treatment: I was in inpatient treatment a long time ago. Has alcohol/substance abuse ever caused legal problems?: Yes  Social Support System:   Patient's Community Support System: Good Describe Community Support System: Skip this question. Type of faith/religion: God How does patient's faith help to cope with current illness?: I pray  Leisure/Recreation:   Do You Have Hobbies?: Yes Leisure and Hobbies: I like playing basketball, listening to music and cooking.  Strengths/Needs:   What is the patient's perception of their strengths?: I like smiling, that's it. Patient states they can use these personal strengths during their treatment to contribute to their recovery: Skip this question. Patient states these barriers may affect/interfere with their treatment: not listening Patient states these barriers may affect their return to the community: none reported Other important information patient would like considered in planning for their treatment: none reported  Discharge Plan:   Patient states concerns and preferences for aftercare planning are: I don't have a psychiatrist or therapist right now. Patient states they will know when they are safe and ready for discharge when: I will leave the hospital in a couple of days. Does patient have access to transportation?: No Patient description of barriers related to discharge medications: My mom will help me with that. Plan for no access to transportation at discharge: The last time I was in the hospital, a taxi was provided. Will patient be returning to same living situation after discharge?: Yes  Summary/Recommendations:   Summary and Recommendations (to be completed by the evaluator): [name] is a 33 year old male invluntarily admitted to [facility] due to suicidal ideations.  He had thoughts of jumping in  front of traffic, hitting his head or jumping off the bridge.  At admission, patient reported that he ran out of medications, and couldn't remember when he took his last dose.  Patient used recreational drugs the day before he was admitted to the hospital. He reported being released from jail 3 days before he was admitted to the hospital.  During the assessment patient denied any substance use, however tested positive for cocaine.  Patient declined discussing his childhood; according to the information in the file, he was adoptive parents.  He reported working on and off for [employer] for the past 2 1/2 months.  He said that he lives with his mom, where he will return upon discharge.  He said that he doesn't have a psychiatrist or therapist.  He said his goal for this hospital stay is to restart his medications.  While here, [name] can benefit from crisis stabilization, medication management, therapeutic milieu, and referrals for services.   Keyana Guevara O Frank Pilger, LCSWA 07/28/2023

## 2023-07-28 NOTE — Progress Notes (Signed)
(  Sleep Hours) - 6 (Any PRNs that were needed, meds refused, or side effects to meds)- PRN Tylenol , PRN Trazodone , and PRN Hydroxyzine   (Any disturbances and when (visitation, over night)- None, no behavioral incidents  (Concerns raised by the patient)- None (SI/HI/AVH)- Denies, contracts for safety

## 2023-07-28 NOTE — Group Note (Signed)
 Date:  07/28/2023 Time:  9:05 PM  Group Topic/Focus:  Wrap-Up Group:   The focus of this group is to help patients review their daily goal of treatment and discuss progress on daily workbooks.    Participation Level:  Did Not Attend   [name] 07/28/2023, 9:05 PM

## 2023-07-28 NOTE — Progress Notes (Signed)
   07/28/23 0800  Psych Admission Type (Psych Patients Only)  Admission Status Involuntary  Psychosocial Assessment  Patient Complaints Anxiety  Eye Contact Fair  Facial Expression Animated  Affect Anxious  Speech Logical/coherent  Interaction Cautious  Motor Activity Other (Comment) (WDL)  Appearance/Hygiene Unremarkable  Behavior Characteristics Cooperative;Appropriate to situation  Mood Anxious;Preoccupied  Thought Process  Coherency Blocking  Content Preoccupation  Delusions None reported or observed  Perception WDL  Hallucination None reported or observed  Judgment Poor  Confusion None  Danger to Self  Current suicidal ideation? Passive;Denies  Agreement Not to Harm Self Yes  Description of Agreement Verbal  Danger to Others  Danger to Others None reported or observed

## 2023-07-28 NOTE — Plan of Care (Signed)
   Problem: Education: Goal: Knowledge of [facility] Education information/materials will improve Outcome: Progressing Goal: Emotional status will improve Outcome: Progressing Goal: Mental status will improve Outcome: Progressing Goal: Verbalization of understanding the information provided will improve Outcome: Progressing

## 2023-07-29 LAB — HEMOGLOBIN A1C
Hgb A1c MFr Bld: 4.9 % (ref 4.8–5.6)
Mean Plasma Glucose: 93.93 mg/dL

## 2023-07-29 LAB — LIPID PANEL
Cholesterol: 139 mg/dL (ref 0–200)
HDL: 56 mg/dL (ref >40)
LDL Cholesterol: 62 mg/dL (ref 0–99)
Total CHOL/HDL Ratio: 2.5 ratio
Triglycerides: 106 mg/dL (ref <150)
VLDL: 21 mg/dL (ref 0–40)

## 2023-07-29 LAB — GLUCOSE, CAPILLARY: Glucose-Capillary: 91 mg/dL (ref 70–99)

## 2023-07-29 MED ORDER — ONDANSETRON 4 MG PO TBDP
4.0000 mg | ORAL_TABLET | Freq: Three times a day (TID) | ORAL | Status: DC | PRN
  Administered 2023-07-29: 4 mg via ORAL
  Filled 2023-07-29: qty 1

## 2023-07-29 MED ORDER — CLOZAPINE 100 MG PO TABS: 100.0000 mg | ORAL_TABLET | Freq: Every day | ORAL | Status: DC

## 2023-07-29 MED ORDER — CLOZAPINE 100 MG PO TABS
100.0000 mg | ORAL_TABLET | Freq: Every day | ORAL | Status: AC
  Administered 2023-07-29: 100 mg via ORAL
  Filled 2023-07-29: qty 1

## 2023-07-29 NOTE — Progress Notes (Signed)
 Dar Note: Patient presents with a flat affect and depressed mood.  Denies suicidal thoughts, auditory and visual hallucinations.  Patient complain of dizziness and sat on the floor.  Vital signes results within normal limit.  Routine safety checks maintained.  Patient safe on and off the unit.

## 2023-07-29 NOTE — Progress Notes (Signed)
 [facility] MD Progress Note  07/29/2023 5:05 PM [name]  MRN:  [id number]  Reason for admission: 33 year old AA male with hx of paranoia schizophrenia, schizoaffective disorder, bipolar-type & stimulant (cocaine) use disorder. Admitted to the [facility] from [facility] with complaint of worsening suicidal ideations with plan to jump into traffic, jump off a bridge or hit his head. Per chart review, patient had apparently gone to the [name] with complaint of eye pain & suicidal ideations. While being evaluated at the ER, patient did report an interest in getting back to his mental health medications. After medical evaluation & stabilization, patient was transferred to the [facility] for further psychiatric evaluation/treatments. Patient was admitted & treated in this [facility] last may, 2025. Per chart review, most of his psychiatric admissions/treatments was at the [facility]. Patient's chart & current lab results reviewed.   Daily notes: [name] is seen in his room this morning. Chart reviewed. The chart findings discussed with the treatment team this afternoon during the team meeting. He presents alert, oriented to himself, place & situation. He is more alert today than yesterday & spoke clearly than yesterday. He did ask when he could be discharged from the hospital? Patient was informed that his medications are still under titration to combat his symptoms. The staff reports that patient experienced a syncope episode this morning, however, did not fall or sustained any injuries. There is a new order for his blood pressure to be monitored tid. Although showing some signs of mental clarity, patient remains a poor historian. He does not appear to be in any apparent distress. Reviewed vital signs, pulse is elevated at 113. The staff will continue to monitor. Continue current plan of care as already in progress.   Principal Problem: Schizophrenia, paranoid (HCC)  Diagnosis: Principal Problem:   Schizophrenia, paranoid (HCC) Active  Problems:   Cocaine abuse with cocaine-induced mood disorder (HCC)   Schizoaffective disorder, bipolar type (HCC)   Schizophrenia, acute (HCC)  Total Time spent with patient: 45 minutes  Past Psychiatric History: See H&P.  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 No pertinent surgical history.  Family History: History reviewed. No pertinent family history.  Family Psychiatric  History: See H&P.  Social History:  Social History   Substance and Sexual Activity  Alcohol Use Not Currently     Social History   Substance and Sexual Activity  Drug Use Yes   Types: Cocaine, Marijuana   Comment: crack 06-26-19    Social History   Socioeconomic History   Marital status: Single    Spouse name: Not on file   Number of children: Not on file   Years of education: Not on file   Highest education level: Not on file  Occupational History   Not on file  Tobacco Use   Smoking status: Never   Smokeless tobacco: Never  Substance and Sexual Activity   Alcohol use: Not Currently   Drug use: Yes    Types: Cocaine, Marijuana    Comment: crack 06-26-19   Sexual activity: Yes  Other Topics Concern   Not on file  Social History Narrative   Not on file   Social Drivers of Health   Financial Resource Strain: Not on file  Food Insecurity: Food Insecurity Present (07/27/2023)   Hunger Vital Sign    Worried About  Running Out of Food in the Last Year: Sometimes true    Ran Out of Food in the Last Year: Sometimes true  Transportation Needs: Unmet Transportation Needs (07/27/2023)   PRAPARE - Administrator, Civil Service (Medical): Yes    Lack of Transportation (Non-Medical): Yes  Physical Activity: Not on file  Stress: Not on file  Social Connections: Unknown (07/27/2023)   Social Connection and Isolation Panel     Frequency of Communication with Friends and Family: Not on file    Frequency of Social Gatherings with Friends and Family: Not on file    Attends Religious Services: Not on Marketing executive or Organizations: Not on file    Attends Banker Meetings: Not on file    Marital Status: Never married   Additional Social History:   Sleep: Good Estimated Sleeping Duration (Last 24 Hours): 5.75-6.75 hours  Appetite:  Good  Current Medications: Current Facility-Administered Medications  Medication Dose Route Frequency Provider Last Rate Last Admin   acetaminophen  (TYLENOL ) tablet 650 mg  650 mg Oral Q6H PRN Jadapalle, Sree, MD   650 mg at 07/29/23 1408   alum & mag hydroxide-simeth (MAALOX/MYLANTA) 200-200-20 MG/5ML suspension 30 mL  30 mL Oral Q4H PRN Donnelly Mellow, MD       cloZAPine  (CLOZARIL ) tablet 100 mg  100 mg Oral QHS Iva Posten I, NP       Followed by   NOREEN ON 07/30/2023] cloZAPine  (CLOZARIL ) tablet 100 mg  100 mg Oral QHS Kaelin Holford I, NP       Followed by   NOREEN ON 07/31/2023] cloZAPine  (CLOZARIL ) tablet 100 mg  100 mg Oral QHS Jernard Reiber I, NP       haloperidol  lactate (HALDOL ) injection 5 mg  5 mg Intramuscular TID PRN Jadapalle, Sree, MD       And   diphenhydrAMINE  (BENADRYL ) injection 50 mg  50 mg Intramuscular TID PRN Jadapalle, Sree, MD       And   LORazepam  (ATIVAN ) injection 2 mg  2 mg Intramuscular TID PRN Jadapalle, Sree, MD       hydrOXYzine  (ATARAX ) tablet 25 mg  25 mg Oral Q6H PRN Jadapalle, Sree, MD   25 mg at 07/27/23 2046   lithium  carbonate (ESKALITH ) ER tablet 900 mg  900 mg Oral QHS Izediuno, Vincent A, MD   900 mg at 07/28/23 2054   magnesium  hydroxide (MILK OF MAGNESIA) suspension 30 mL  30 mL Oral Daily PRN Donnelly Mellow, MD       ondansetron  (ZOFRAN -ODT) disintegrating tablet 4 mg  4 mg Oral Q8H PRN Bobbitt, Shalon E, NP   4 mg at 07/29/23 0643   senna (SENOKOT) tablet 17.2 mg  2 tablet Oral QHS Kelleigh Skerritt I, NP    17.2 mg at 07/28/23 2054   traZODone  (DESYREL ) tablet 50 mg  50 mg Oral QHS PRN Jadapalle, Sree, MD   50 mg at 07/28/23 2054   Lab Results:  Results for orders placed or performed during the hospital encounter of 07/27/23 (from the past 48 hours)  Glucose, capillary     Status: None   Collection Time: 07/29/23  6:14 AM  Result Value Ref Range   Glucose-Capillary 91 70 - 99 mg/dL    Comment: Glucose reference range applies only to samples taken after fasting for at least 8 hours.   Blood Alcohol level:  Lab Results  Component Value Date   [facility] <15 07/26/2023  ETH <15 05/24/2023   Metabolic Disorder Labs: No results found for: HGBA1C, MPG No results found for: PROLACTIN Lab Results  Component Value Date   CHOL 121 11/30/2018   TRIG 102 11/30/2018   HDL 39 (L) 11/30/2018   CHOLHDL 3.1 11/30/2018   VLDL 20 11/30/2018   LDLCALC 62 11/30/2018   Physical Findings: AIMS:  ,  ,  ,  ,  ,  ,   CIWA:    COWS:     Musculoskeletal: Strength & Muscle Tone: within normal limits Gait & Station: normal Patient leans: N/A  Psychiatric Specialty Exam:  Presentation  General Appearance:  Casual  Eye Contact: Fair  Speech: Slow  Speech Volume: Decreased  Handedness: Right   Mood and Affect  Mood: Anxious; Depressed  Affect: Flat   Thought Process  Thought Processes: Other (comment); Disorganized (Scattered.)  Descriptions of Associations:Tangential  Orientation:Partial  Thought Content:Scattered; Tangential  History of Schizophrenia/Schizoaffective disorder:Yes  Duration of Psychotic Symptoms:Greater than six months  Hallucinations:Hallucinations: None  Ideas of Reference:None  Suicidal Thoughts:Suicidal Thoughts: Yes, Passive SI Passive Intent and/or Plan: Without Intent; Without Plan; Without Means to Carry Out; Without Access to Means  Homicidal Thoughts:Homicidal Thoughts: No   Sensorium  Memory: Recent Poor; Immediate Fair; Remote  Poor  Judgment: Impaired  Insight: Shallow   Executive Functions  Concentration: Fair  Attention Span: Fair  Recall: Poor  Fund of Knowledge: Poor  Language: Fair   Psychomotor Activity  Psychomotor Activity: Psychomotor Activity: Decreased  Assets  Assets: Desire for Improvement; Housing; Social Support   Sleep  Sleep: Sleep: Good Number of Hours of Sleep: 6.75  Physical Exam: Physical Exam Vitals and nursing note reviewed.  HENT:     Head: Normocephalic.     Nose: Nose normal.     Mouth/Throat:     Pharynx: Oropharynx is clear.  Cardiovascular:     Rate and Rhythm: Normal rate.     Pulses: Normal pulses.  Pulmonary:     Effort: Pulmonary effort is normal.  Genitourinary:    Comments: Deferred Musculoskeletal:        General: Normal range of motion.     Cervical back: Normal range of motion.  Skin:    General: Skin is dry.  Neurological:     General: No focal deficit present.     Mental Status: He is alert and oriented to person, place, and time.    Review of Systems  Constitutional:  Negative for chills, diaphoresis and fever.  HENT:  Negative for congestion and sore throat.   Respiratory:  Negative for cough, shortness of breath and wheezing.   Cardiovascular:  Negative for chest pain and palpitations.  Gastrointestinal:  Negative for abdominal pain, constipation, diarrhea, heartburn, nausea and vomiting.  Genitourinary:  Negative for dysuria.  Musculoskeletal:  Negative for joint pain and myalgias.  Neurological:  Negative for dizziness, tingling, tremors, sensory change, speech change, focal weakness, seizures, loss of consciousness, weakness and headaches.   Blood pressure 123/81, pulse (!) 113, temperature 97.6 F (36.4 C), temperature source Oral, resp. rate 18, height 5' 11 (1.803 m), weight 69.3 kg, SpO2 100%. Body mass index is 21.31 kg/m.  Treatment Plan Summary: Daily contact with patient to assess and evaluate symptoms and  progress in treatment and Medication management.   Continue inpatient hospitalization.  Will continue today 07/29/2023 plan as below except where it is noted.   Principal/active diagnoses.  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 The patient consents to medication trial.    -Clozaril  100 mg po Q bedtime on 07-28-23 for mood control.  -Clozaril  10000 mg po Q bedtime on 07-29-23 for mood control.  -Clozaril  10000 mg po Q bedtime on 07-30-23 for mood control.  -Continue Clozaril  100 mg po Q bedtime for mood control.  -Continue Lithium  carbonate ER 900 mg po Q bedtime for mood control. -Continue Hydroxyzine  25 mg po tid prn for anxiety.   -Continue Trazodone  50 mg po Q hs prn for insomnia.    Agitation protocols.  Continue as recommended (See MAR).    Other PRNS -Continue Tylenol  650 mg every 6 hours PRN for mild pain -Continue Maalox 30 ml Q 4 hrs PRN for indigestion -Continue MOM 30 ml po Q 6 hrs for constipation   Safety and Monitoring: Voluntary admission to inpatient psychiatric unit for safety, stabilization and treatment Daily contact with patient to assess and evaluate symptoms and progress in treatment Patient's case to be discussed in multi-disciplinary team meeting Observation Level : q15 minute checks Vital signs: q12 hours Precautions: Safety   Discharge Planning: Social work and case management to assist with discharge planning and identification of hospital follow-up needs prior to discharge Estimated LOS: 5-7 days Discharge Concerns: Need to establish a safety plan; Medication compliance and effectiveness Discharge Goals: Return home with outpatient referrals for mental health follow-up  including medication management/psychotherapy  Mac Bolster, NP, pmhnp, fnp-bc. 07/29/2023, 5:05 PM

## 2023-07-29 NOTE — BHH Group Notes (Signed)
 Adult Psychoeducational Group Note  Date:  07/29/2023 Time:  3:06 PM  Group Topic/Focus:  Goals Group:   The focus of this group is to help patients establish daily goals to achieve during treatment and discuss how the patient can incorporate goal setting into their daily lives to aide in recovery.  Participation Level:  Did Not Attend  Participation Quality:    Affect:    Cognitive:    Insight:   Engagement in Group:    Modes of Intervention:    Additional Comments:    [name] 07/29/2023, 3:06 PM

## 2023-07-29 NOTE — Group Note (Signed)
 Recreation Therapy Group Note   Group Topic:Health and Wellness  Group Date: 07/29/2023 Start Time: 1009 End Time: 1044 Facilitators: Roiza Wiedel-McCall, LRT,CTRS Location: [address] Dayroom   Group Topic: Wellness  Goal Area(s) Addresses:  Patient will define components of whole wellness. Patient will verbalize benefit of whole wellness.  Behavioral Response: Sleep  Intervention: [employer], Calm App  Activity: LRT and patients discussed the 3 elements of wellness (mental, physical and spiritual). LRT and patients worked on 2 of those elements (mental and physical). LRT played spa music while leading patients in 2 rounds of chair exercises. LRT then played a meditation on being resilient when faced with difficult situations.   Education: Wellness, Building control surveyor.   Education Outcome: Acknowledges education/In group clarification offered/Needs additional education.    Affect/Mood: Sleep   Participation Level: None   Participation Quality: None   Behavior: Sleep   Speech/Thought Process: None   Insight: None   Judgement: None   Modes of Intervention: Music and Exercise   Patient Response to Interventions:  Sleep   Education Outcome:  In group clarification offered    Clinical Observations/Individualized Feedback: Pt was sleep before group started. Pt slept all the way through half of the meditation before leaving. Pt didn't return.    Plan: Continue to engage patient in RT group sessions 2-3x/week.   Neely Kammerer-McCall, LRT, CTRS 07/29/2023 12:49 PM

## 2023-07-29 NOTE — Care Management Important Message (Signed)
 Patient informed of right to appeal discharge, provided phone number to [facility]. Patient expressed no interest in appealing discharge at this time. CSW will continue to monitor situation.   Artha Stavros, LCSWA 07/29/2023

## 2023-07-29 NOTE — Progress Notes (Signed)
   07/29/23 2300  Psych Admission Type (Psych Patients Only)  Admission Status Involuntary  Psychosocial Assessment  Patient Complaints Anxiety  Eye Contact Fair  Facial Expression Animated  Affect Anxious  Speech Logical/coherent  Interaction Assertive  Motor Activity Fidgety  Appearance/Hygiene Unremarkable  Behavior Characteristics Cooperative  Mood Anxious  Thought Process  Coherency WDL  Content WDL  Delusions None reported or observed  Perception WDL  Hallucination None reported or observed  Judgment Poor  Confusion None  Danger to Self  Current suicidal ideation? Denies  Self-Injurious Behavior No self-injurious ideation or behavior indicators observed or expressed   Agreement Not to Harm Self Yes  Description of Agreement Verbal  Danger to Others  Danger to Others None reported or observed

## 2023-07-29 NOTE — Progress Notes (Addendum)
(  Sleep Hours) -  6.75 hours (Any PRNs that were needed, meds refused, or side effects to meds)- Trazodone  50 mg PO , Acetaminophen  650 at  mg PO at 2054(See eMAR), Zofran  4 mg SL.  (Any disturbances and when (visitation, overnight)- None (Concerns raised by the patient)- Anxiety/Insomnia/Right Neck Pain 7/10  Around 0610 this am, patient had c/o of Nausea, no pain and Lethargy/syncope. V/S obtained. Patient Hypotensive. On call provider notified.Orders received. Patient given PO fluids (see Vital Signs Flowsheet), emotional support, relaxation, continued safety (SI/HI/AVH)- Denies   Of note, patient states he has had syncope in the past, and does not have a primary provider to assess/diagnose.

## 2023-07-29 NOTE — [facility] Suicide Risk Assessment (Signed)
 [facility] INPATIENT:  Family/Significant Other Suicide Prevention Education  Suicide Prevention Education:  Contact Attempts: Mother, [name] [phone], (name of family member/significant other) has been identified by the patient as the family member/significant other with whom the patient will be residing, and identified as the person(s) who will aid the patient in the event of a mental health crisis.   Date and time of first attempt:07/29/2023 / 3:30 PM  The phone rang but there was no answer.  CSW couldn't leave a voicemail because the mailbox was full.   Whitney Hillegass O Josede Cicero, LCSWA 07/29/2023, 3:30 PM

## 2023-07-29 NOTE — Care Management Important Message (Signed)
 Medicare IM printed and given to social work to give to the patient prior to discharge.

## 2023-07-30 MED ORDER — CLOZAPINE 100 MG PO TABS
100.0000 mg | ORAL_TABLET | Freq: Every day | ORAL | Status: AC
  Administered 2023-07-30: 100 mg via ORAL
  Filled 2023-07-30: qty 1

## 2023-07-30 NOTE — Plan of Care (Signed)

## 2023-07-30 NOTE — Plan of Care (Signed)
   Problem: Education: Goal: Emotional status will improve Outcome: Progressing Goal: Mental status will improve Outcome: Progressing Goal: Verbalization of understanding the information provided will improve Outcome: Progressing

## 2023-07-30 NOTE — Group Note (Signed)
 Date:  07/30/2023 Time:  3:21 PM  Group Topic/Focus:  Goals Group:   The focus of this group is to help patients establish daily goals to achieve during treatment and discuss how the patient can incorporate goal setting into their daily lives to aide in recovery. Orientation:   The focus of this group is to educate the patient on the purpose and policies of crisis stabilization and provide a format to answer questions about their admission.  The group details unit policies and expectations of patients while admitted.    Participation Level:  Did Not Attend  [name] [name] 07/30/2023, 3:21 PM

## 2023-07-30 NOTE — Progress Notes (Signed)
   07/30/23 1124  Psych Admission Type (Psych Patients Only)  Admission Status Involuntary  Psychosocial Assessment  Patient Complaints Anxiety  Eye Contact Fair  Facial Expression Flat  Affect Preoccupied  Speech Soft  Interaction Minimal  Motor Activity Slow  Appearance/Hygiene In scrubs  Behavior Characteristics Cooperative;Appropriate to situation  Mood Anxious;Preoccupied  Thought Process  Coherency WDL  Content WDL  Delusions None reported or observed  Perception WDL  Hallucination None reported or observed  Judgment Poor  Confusion None  Danger to Self  Current suicidal ideation? Denies  Agreement Not to Harm Self Yes  Description of Agreement Verbal  Danger to Others  Danger to Others None reported or observed

## 2023-07-30 NOTE — Group Note (Signed)
 Date:  07/30/2023 Time:  9:04 PM  Group Topic/Focus:  Wrap-Up Group:   The focus of this group is to help patients review their daily goal of treatment and discuss progress on daily workbooks.    Participation Level:  Active  Participation Quality:  Appropriate  Affect:  Appropriate  Cognitive:  Appropriate  Insight: Appropriate  Engagement in Group:  Engaged  Modes of Intervention:  Education  Additional Comments:  Patient attended and participated in group tonight. He reports that his goal was to not get angry.  He did meet his goals today.  Gwenn Chillington Dacosta 07/30/2023, 9:04 PM

## 2023-07-30 NOTE — Progress Notes (Signed)
(  Sleep Hours) -  8 hours (Any PRNs that were needed, meds refused, or side effects to meds)- None (Any disturbances and when (visitation, overnight)- None (Concerns raised by the patient)- None (SI/HI/AVH)- Denies

## 2023-07-30 NOTE — [facility] Group Notes (Signed)
 Adult Psychoeducational Group Note  Date:  07/30/2023 Time:  2:54 AM  Group Topic/Focus:  Wrap-Up Group:   The focus of this group is to help patients review their daily goal of treatment and discuss progress on daily workbooks.  Participation Level:  Active  Participation Quality:  Drowsy  Affect:  Flat  Cognitive:  Disorganized  Insight: Lacking  Engagement in Group:  Improving  Modes of Intervention:  Clarification and Discussion  Additional Comments:  Pt attended and participated in wrap up group.  Arlyss Deutscher Cassandra 07/30/2023, 2:54 AM

## 2023-07-30 NOTE — Group Note (Signed)
 Recreation Therapy Group Note   Group Topic:Leisure Education  Group Date: 07/30/2023 Start Time: 1020 End Time: 1045 Facilitators: Aston Lawhorn-McCall, LRT,CTRS Location: [address] Dayroom   Group Topic: Leisure Education    Goal Area(s) Addresses:  Patient will successfully identify benefits of leisure participation. Patient will successfully identify ways to access leisure activities. Patient will identify leisure activities available based on their geographical location in proximity to their home.   Behavioral Response:    Intervention: Poster Making   Activity: LRT and patients discussed leisure and its importance in a persons development. Patients had the option of working in groups or individually. Groups were given a poster board and writing utensils. Patients were to then create and advertisement that highlighted what leisure is, who can benefit and examples of leisure activities. Patients were to create their PSA in the form of a print ad or commercial. Patients presented their PSAs at the end of group.    Education:  Leisure Education, Publishing copy Outcome: Acknowledges education   Affect/Mood: N/A   Participation Level: Did not attend    Clinical Observations/Individualized Feedback:    Plan: Continue to engage patient in RT group sessions 2-3x/week.   Jonaven Hilgers-McCall, LRT,CTRS 07/30/2023 1:06 PM

## 2023-07-30 NOTE — [facility] Suicide Risk Assessment (Signed)
 [facility] INPATIENT:  Family/Significant Other Suicide Prevention Education   Suicide Prevention Education:  Contact Attempts: Mother, [name] [phone], (name of family member/significant other) has been identified by the patient as the family member/significant other with whom the patient will be residing, and identified as the person(s) who will aid the patient in the event of a mental health crisis.    Date and time of first attempt: 07/30/2023 / 4:43 PM   The phone rang but there was no answer.  CSW couldn't leave a voicemail because the mailbox was full.  [name] O Eliyah Bazzi, LCSWA 07/30/2023, 4:43 PM

## 2023-07-30 NOTE — Progress Notes (Signed)
 [facility] MD Progress Note  07/30/2023 1:50 PM [name]  MRN:  [id number]  Reason for admission: 33 year old AA male with hx of paranoia schizophrenia, schizoaffective disorder, bipolar-type & stimulant (cocaine) use disorder. Admitted to the [facility] from [facility] with complaint of worsening suicidal ideations with plan to jump into traffic, jump off a bridge or hit his head. Per chart review, patient had apparently gone to the [facility] with complaint of eye pain & suicidal ideations. While being evaluated at the ER, patient did report an interest in getting back to his mental health medications. After medical evaluation & stabilization, patient was transferred to the [facility] for further psychiatric evaluation/treatments. Patient was admitted & treated in this [facility] last may, 2025. Per chart review, most of his psychiatric admissions/treatments was at the [facility]. Patient's chart & current lab results reviewed.   Daily notes: [name] is seen in his room. Chart reviewed. The chart finding discussed with the treatment team during the team meeting this afternoon. He presents alert, oriented & aware of situation. He is visible on the unit, attending some group sessions. When not in the day room, [name] is seen lying down in his bed. There are no behavioral issues displayed by the patient or reported by staff. He reports, I'm doing good. My mood is good. I'm sleeping well at night. I'm not having any problems. When can I go home? The staff reports that patient experienced another syncope episode last night, however, did not fall or sustained any injuries. There have not been any complaint of syncope this shift. There is a new order for patient's blood pressure to be monitored tid. His current blood pressure is 120/91 & pulse rate is 94. We will keep monitoring blood pressure.  Although showing some signs of mental clarity, patient remains a poor historian. He does not appear to be in any apparent distress. The staff will  continue to monitor. Continue current plan of care as already in progress. Patient is gradually showing improvement in symptoms reduction.   Principal Problem: Schizophrenia, paranoid (HCC)  Diagnosis: Principal Problem:   Schizophrenia, paranoid (HCC) Active Problems:   Cocaine abuse with cocaine-induced mood disorder (HCC)   Schizoaffective disorder, bipolar type (HCC)   Schizophrenia, acute (HCC)  Total Time spent with patient: 45 minutes  Past Psychiatric History: See H&P.  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 No pertinent surgical history.  Family History: History reviewed. No pertinent family history.  Family Psychiatric  History: See H&P.  Social History:  Social History   Substance and Sexual Activity  Alcohol Use Not Currently     Social History   Substance and Sexual Activity  Drug Use Yes   Types: Cocaine, Marijuana   Comment: crack 06-26-19    Social History   Socioeconomic History   Marital status: Single    Spouse name: Not on file   Number of children: Not on file   Years of education: Not on file   Highest education level: Not on file  Occupational History   Not on file  Tobacco Use   Smoking status: Never   Smokeless tobacco: Never  Substance and Sexual Activity   Alcohol use: Not Currently   Drug use: Yes    Types: Cocaine, Marijuana    Comment: crack 06-26-19   Sexual activity: Yes  Other Topics Concern   Not on file  Social History Narrative   Not on file   Social Drivers of Health   Financial Resource Strain: Not on file  Food Insecurity: Food Insecurity Present (07/27/2023)   Hunger Vital Sign    Worried About Running Out of Food in the Last Year: Sometimes true    Ran Out of Food in the Last Year: Sometimes true  Transportation Needs: Unmet Transportation Needs  (07/27/2023)   PRAPARE - Administrator, Civil Service (Medical): Yes    Lack of Transportation (Non-Medical): Yes  Physical Activity: Not on file  Stress: Not on file  Social Connections: Unknown (07/27/2023)   Social Connection and Isolation Panel    Frequency of Communication with Friends and Family: Not on file    Frequency of Social Gatherings with Friends and Family: Not on file    Attends Religious Services: Not on Marketing executive or Organizations: Not on file    Attends Banker Meetings: Not on file    Marital Status: Never married   Additional Social History:   Sleep: Good Estimated Sleeping Duration (Last 24 Hours): 7.75-8.75 hours  Appetite:  Good  Current Medications: Current Facility-Administered Medications  Medication Dose Route Frequency Provider Last Rate Last Admin   acetaminophen  (TYLENOL ) tablet 650 mg  650 mg Oral Q6H PRN Jadapalle, Sree, MD   650 mg at 07/29/23 1408   alum & mag hydroxide-simeth (MAALOX/MYLANTA) 200-200-20 MG/5ML suspension 30 mL  30 mL Oral Q4H PRN Donnelly Mellow, MD       cloZAPine  (CLOZARIL ) tablet 100 mg  100 mg Oral QHS Sidi Dzikowski I, NP       haloperidol  lactate (HALDOL ) injection 5 mg  5 mg Intramuscular TID PRN Jadapalle, Sree, MD       And   diphenhydrAMINE  (BENADRYL ) injection 50 mg  50 mg Intramuscular TID PRN Jadapalle, Sree, MD       And   LORazepam  (ATIVAN ) injection 2 mg  2 mg Intramuscular TID PRN Jadapalle, Sree, MD       hydrOXYzine  (ATARAX ) tablet 25 mg  25 mg Oral Q6H PRN Jadapalle, Sree, MD   25 mg at 07/27/23 2046   lithium  carbonate (ESKALITH ) ER tablet 900 mg  900 mg Oral QHS Izediuno, Vincent A, MD   900 mg at 07/29/23 2057   magnesium  hydroxide (MILK OF MAGNESIA) suspension 30 mL  30 mL Oral Daily PRN Donnelly Mellow, MD       ondansetron  (ZOFRAN -ODT) disintegrating tablet 4 mg  4 mg Oral Q8H PRN Bobbitt, Shalon E, NP   4 mg at 07/29/23 0643   senna (SENOKOT) tablet 17.2 mg  2  tablet Oral QHS Adan Beal, Mac I, NP   17.2 mg at 07/29/23 2058   traZODone  (DESYREL ) tablet 50 mg  50 mg Oral QHS PRN Jadapalle, Sree, MD   50 mg at 07/28/23 2054   Lab Results:  Results for orders placed or performed during the hospital encounter of 07/27/23 (from the past 48 hours)  Glucose, capillary     Status: None   Collection Time: 07/29/23  6:14 AM  Result Value Ref Range   Glucose-Capillary 91 70 - 99 mg/dL    Comment: Glucose reference range applies only to samples taken after fasting for at least 8 hours.  Hemoglobin A1c     Status: None   Collection Time: 07/29/23  6:31 PM  Result Value Ref Range   Hgb  A1c MFr Bld 4.9 4.8 - 5.6 %    Comment: (NOTE) Diagnosis of Diabetes The following HbA1c ranges recommended by the American Diabetes Association ([name]) may be used as an aid in the diagnosis of diabetes mellitus.  Hemoglobin             Suggested A1C NGSP%              Diagnosis  <5.7                   Non Diabetic  5.7-6.4                Pre-Diabetic  >6.4                   Diabetic  <7.0                   Glycemic control for                       adults with diabetes.     Mean Plasma Glucose 93.93 mg/dL    Comment: Performed at [facility], [address], [place], [place] [address]  Lipid panel     Status: None   Collection Time: 07/29/23  6:31 PM  Result Value Ref Range   Cholesterol 139 0 - 200 mg/dL   Triglycerides 893 <849 mg/dL   HDL 56 >59 mg/dL   Total CHOL/HDL Ratio 2.5 RATIO   VLDL 21 0 - 40 mg/dL   LDL Cholesterol 62 0 - 99 mg/dL    Comment:        Total Cholesterol/HDL:CHD Risk Coronary Heart Disease Risk Table                     Men   Women  1/2 Average Risk   3.4   3.3  Average Risk       5.0   4.4  2 X Average Risk   9.6   7.1  3 X Average Risk  23.4   11.0        Use the calculated Patient Ratio above and the CHD Risk Table to determine the patient's CHD Risk.        ATP III CLASSIFICATION (LDL):  <100     mg/dL   Optimal   899-870  mg/dL   Near or Above                    Optimal  130-159  mg/dL   Borderline  839-810  mg/dL   High  >809     mg/dL   Very High Performed at [facility], [address] [place], [place] [address]    Blood Alcohol level:  Lab Results  Component Value Date   [facility] <15 07/26/2023   ETH <15 05/24/2023   Metabolic Disorder Labs: Lab Results  Component Value Date   HGBA1C 4.9 07/29/2023   MPG 93.93 07/29/2023   No results found for: PROLACTIN Lab Results  Component Value Date   CHOL 139 07/29/2023   TRIG 106 07/29/2023   HDL 56 07/29/2023   CHOLHDL 2.5 07/29/2023   VLDL 21 07/29/2023   LDLCALC 62 07/29/2023   LDLCALC 62 11/30/2018   Physical Findings: AIMS:  ,  ,  ,  ,  ,  ,   CIWA:    COWS:     Musculoskeletal: Strength & Muscle Tone: within normal limits Gait &  Station: normal Patient leans: N/A  Psychiatric Specialty Exam:  Presentation  General Appearance:  Casual; Fairly Groomed  Eye Contact: -- (Improving.)  Speech: Clear and Coherent; Normal Rate  Speech Volume: Normal  Handedness: Right   Mood and Affect  Mood: -- (Improving)  Affect: Congruent  Thought Process  Thought Processes: Coherent; Linear  Descriptions of Associations:Intact  Orientation:Full (Time, Place and Person)  Thought Content:Logical  History of Schizophrenia/Schizoaffective disorder:Yes  Duration of Psychotic Symptoms:Greater than six months  Hallucinations:Hallucinations: None   Ideas of Reference:None  Suicidal Thoughts:Suicidal Thoughts: No   Homicidal Thoughts:Homicidal Thoughts: No    Sensorium  Memory: Immediate Good; Recent Fair; Remote Fair  Judgment: Fair  Insight: Good  Executive Functions  Concentration: Good  Attention Span: Good  Recall: Fair  Fund of Knowledge: Fair  Language: Good  Psychomotor Activity  Psychomotor Activity: Psychomotor Activity: Normal  Assets   Assets: Communication Skills; Desire for Improvement; Housing; Physical Health; Resilience; Social Support  Sleep  Sleep: Sleep: Good Number of Hours of Sleep: 8  Physical Exam: Physical Exam Vitals and nursing note reviewed.  HENT:     Head: Normocephalic.     Nose: Nose normal.     Mouth/Throat:     Pharynx: Oropharynx is clear.  Cardiovascular:     Rate and Rhythm: Normal rate.     Pulses: Normal pulses.  Pulmonary:     Effort: Pulmonary effort is normal.  Genitourinary:    Comments: Deferred Musculoskeletal:        General: Normal range of motion.     Cervical back: Normal range of motion.  Skin:    General: Skin is dry.  Neurological:     General: No focal deficit present.     Mental Status: He is alert and oriented to person, place, and time.    Review of Systems  Constitutional:  Negative for chills, diaphoresis and fever.  HENT:  Negative for congestion and sore throat.   Respiratory:  Negative for cough, shortness of breath and wheezing.   Cardiovascular:  Negative for chest pain and palpitations.  Gastrointestinal:  Negative for abdominal pain, constipation, diarrhea, heartburn, nausea and vomiting.  Genitourinary:  Negative for dysuria.  Musculoskeletal:  Negative for joint pain and myalgias.  Neurological:  Negative for dizziness, tingling, tremors, sensory change, speech change, focal weakness, seizures, loss of consciousness, weakness and headaches.   Blood pressure (!) 120/91, pulse 94, temperature 97.8 F (36.6 C), temperature source Oral, resp. rate 18, height 5' 11 (1.803 m), weight 69.3 kg, SpO2 100%. Body mass index is 21.31 kg/m.  Treatment Plan Summary: Daily contact with patient to assess and evaluate symptoms and progress in treatment and Medication management.   Continue inpatient hospitalization.  Will continue today 07/30/2023 plan as below except where it is noted.   Principal/active diagnoses.  Schizophrenia, paranoid (HCC) Cocaine  abuse with cocaine-induced mood disorder (HCC) Schizoaffective disorder, bipolar type (HCC) Schizophrenia, acute (HCC)  Plan: The risks/benefits/side-effects/alternatives to the medications in use were discussed in detail with the patient and time was given for patient's  questions. The patient consents to medication trial.    -Clozaril  100 mg po Q bedtime on 07-28-23 for mood control.  -Continue Lithium  carbonate ER 900 mg po Q bedtime for mood control. -Continue Hydroxyzine  25 mg po tid prn for anxiety.   -Continue Trazodone  50 mg po Q hs prn for insomnia.   Labs: Lithium  level to be obtained on 08-01-23.    Agitation protocols.  Continue as recommended (  See MAR).    Other PRNS -Continue Tylenol  650 mg every 6 hours PRN for mild pain -Continue Maalox 30 ml Q 4 hrs PRN for indigestion -Continue MOM 30 ml po Q 6 hrs for constipation   Safety and Monitoring: Voluntary admission to inpatient psychiatric unit for safety, stabilization and treatment Daily contact with patient to assess and evaluate symptoms and progress in treatment Patient's case to be discussed in multi-disciplinary team meeting Observation Level : q15 minute checks Vital signs: q12 hours Precautions: Safety   Discharge Planning: Social work and case management to assist with discharge planning and identification of hospital follow-up needs prior to discharge Estimated LOS: 5-7 days Discharge Concerns: Need to establish a safety plan; Medication compliance and effectiveness Discharge Goals: Return home with outpatient referrals for mental health follow-up including medication management/psychotherapy  Mac Bolster, NP, pmhnp, fnp-bc. 07/30/2023, 1:50 PM Patient ID: [name], male   DOB: 21-Dec-1990, 33 y.o.   MRN: [id number]

## 2023-07-31 MED ORDER — CLOZAPINE 100 MG PO TABS
100.0000 mg | ORAL_TABLET | Freq: Every day | ORAL | Status: DC
  Administered 2023-07-31: 100 mg via ORAL
  Filled 2023-07-31: qty 7
  Filled 2023-07-31: qty 1

## 2023-07-31 NOTE — Plan of Care (Signed)
   Problem: Education: Goal: Knowledge of [facility] Education information/materials will improve Outcome: Progressing Goal: Emotional status will improve Outcome: Progressing Goal: Mental status will improve Outcome: Progressing Goal: Verbalization of understanding the information provided will improve Outcome: Progressing

## 2023-07-31 NOTE — Group Note (Signed)
 Date:  07/31/2023 Time:  8:59 PM  Group Topic/Focus:  Wrap-Up Group:   The focus of this group is to help patients review their daily goal of treatment and discuss progress on daily workbooks.    Participation Level:  Active  Participation Quality:  Appropriate  Affect:  Appropriate  Cognitive:  Appropriate  Insight: Appropriate  Engagement in Group:  Engaged  Modes of Intervention:  Education and Exploration  Additional Comments:  Patient attended and participated in group tonight.  He reports that today he learn that he is not the hardest man.  Natalie Mceuen Dacosta 07/31/2023, 8:59 PM

## 2023-07-31 NOTE — Progress Notes (Addendum)
 [facility] MD Progress Note  07/31/2023 2:25 PM [name]  MRN:  [id number]  Principal Problem: Schizophrenia, paranoid (HCC) Diagnosis: Principal Problem:   Schizophrenia, paranoid (HCC) Active Problems:   Cocaine abuse with cocaine-induced mood disorder (HCC)   Schizoaffective disorder, bipolar type (HCC)   Schizophrenia, acute (HCC)   Reason for Admission:  [name] is a 33 yo male w/ hx of paranoid schizophrenia admitted to [facility] due to worsening SI in setting of cocaine use(admitted on 07/27/2023, total  LOS: 4 days ).   ASSESSMENT: Patient denying significant symptoms of psychosis. No further falls or episodes of syncope. Spoke with patient and patient's mom and they both feel he is back to psychiatric baseline and can be discharged home. Medications will be sent to [facility] in [place].    PLAN: #Paranoid Schizophrenia #Cocaine Use Disorder -advise cessation of all substance use -Clozaril  100 mg po Q bedtime on 07-28-23 for mood control.  -Continue Lithium  carbonate ER 900 mg po Q bedtime for mood control.  -lithium  level 7/20 -Continue Hydroxyzine  25 mg po tid prn for anxiety.   -Continue Trazodone  50 mg po Q hs prn for insomnia.    Disposition Planning: -- Estimated LOS: 4-5 days --Estimated Discharge Date: 08/01/23 --Barriers to Discharge: safety planning -- Discharge Goals: Return home with outpatient referrals for mental health follow-up including medication management/psychotherapy  Subjective: The patient was seen and evaluated on the unit. On assessment today the patient reports doing well. Denies SI/HI/AVH. Reports eating and sleeping well. Denies significant side effects from current medication regiment.   Collateral [name] [phone]) Patient's mom reports patient has returned to psychiatric baseline.  Denies any acute safety concerns.  Reports that he is able to return home tomorrow where they reside in [place].  Denies any firearms.  All  questions were addressed regarding ongoing medication regiment as well as return precautions.   Objective: Chart Review from last 24 hours:  The patient's chart was reviewed and nursing notes were reviewed. The patient's case was discussed in multidisciplinary team meeting.  - Overnight events to report per chart review / staff report: none - Patient took all prescribed medications yes - Patient received the following PRNs: trazodone   Current Medications: Current Facility-Administered Medications  Medication Dose Route Frequency Provider Last Rate Last Admin   acetaminophen  (TYLENOL ) tablet 650 mg  650 mg Oral Q6H PRN Jadapalle, Sree, MD   650 mg at 07/31/23 1131   alum & mag hydroxide-simeth (MAALOX/MYLANTA) 200-200-20 MG/5ML suspension 30 mL  30 mL Oral Q4H PRN Donnelly Mellow, MD       cloZAPine  (CLOZARIL ) tablet 100 mg  100 mg Oral QHS Lynnette Barter, MD       haloperidol  lactate (HALDOL ) injection 5 mg  5 mg Intramuscular TID PRN Jadapalle, Sree, MD       And   diphenhydrAMINE  (BENADRYL ) injection 50 mg  50 mg Intramuscular TID PRN Jadapalle, Sree, MD       And   LORazepam  (ATIVAN ) injection 2 mg  2 mg Intramuscular TID PRN Jadapalle, Sree, MD       hydrOXYzine  (ATARAX ) tablet 25 mg  25 mg Oral Q6H PRN Jadapalle, Sree, MD   25 mg at 07/31/23 0834   lithium  carbonate (ESKALITH ) ER tablet 900 mg  900 mg Oral QHS Izediuno, Jerrell LABOR, MD   900 mg at 07/30/23 2042   magnesium  hydroxide (MILK OF MAGNESIA) suspension 30 mL  30 mL Oral Daily PRN Donnelly Mellow, MD  ondansetron  (ZOFRAN -ODT) disintegrating tablet 4 mg  4 mg Oral Q8H PRN Bobbitt, Shalon E, NP   4 mg at 07/29/23 0643   senna (SENOKOT) tablet 17.2 mg  2 tablet Oral QHS Collene Gouge I, NP   17.2 mg at 07/30/23 2042   traZODone  (DESYREL ) tablet 50 mg  50 mg Oral QHS PRN Jadapalle, Sree, MD   50 mg at 07/30/23 2043    Lab Results:  Results for orders placed or performed during the hospital encounter of 07/27/23 (from the past 48  hours)  Hemoglobin A1c     Status: None   Collection Time: 07/29/23  6:31 PM  Result Value Ref Range   Hgb A1c MFr Bld 4.9 4.8 - 5.6 %    Comment: (NOTE) Diagnosis of Diabetes The following HbA1c ranges recommended by the American Diabetes Association ([name]) may be used as an aid in the diagnosis of diabetes mellitus.  Hemoglobin             Suggested A1C NGSP%              Diagnosis  <5.7                   Non Diabetic  5.7-6.4                Pre-Diabetic  >6.4                   Diabetic  <7.0                   Glycemic control for                       adults with diabetes.     Mean Plasma Glucose 93.93 mg/dL    Comment: Performed at [facility], [address], [place], [place] [address]  Lipid panel     Status: None   Collection Time: 07/29/23  6:31 PM  Result Value Ref Range   Cholesterol 139 0 - 200 mg/dL   Triglycerides 893 <849 mg/dL   HDL 56 >59 mg/dL   Total CHOL/HDL Ratio 2.5 RATIO   VLDL 21 0 - 40 mg/dL   LDL Cholesterol 62 0 - 99 mg/dL    Comment:        Total Cholesterol/HDL:CHD Risk Coronary Heart Disease Risk Table                     Men   Women  1/2 Average Risk   3.4   3.3  Average Risk       5.0   4.4  2 X Average Risk   9.6   7.1  3 X Average Risk  23.4   11.0        Use the calculated Patient Ratio above and the CHD Risk Table to determine the patient's CHD Risk.        ATP III CLASSIFICATION (LDL):  <100     mg/dL   Optimal  899-870  mg/dL   Near or Above                    Optimal  130-159  mg/dL   Borderline  839-810  mg/dL   High  >809     mg/dL   Very High Performed at [facility], [address], [place], [place] [address]     Blood Alcohol level:  Lab Results  Component  Value Date   [facility] <15 07/26/2023   ETH <15 05/24/2023    Metabolic Labs: Lab Results  Component Value Date   HGBA1C 4.9 07/29/2023   MPG 93.93 07/29/2023   No results found for: PROLACTIN Lab Results  Component Value Date    CHOL 139 07/29/2023   TRIG 106 07/29/2023   HDL 56 07/29/2023   CHOLHDL 2.5 07/29/2023   VLDL 21 07/29/2023   LDLCALC 62 07/29/2023   LDLCALC 62 11/30/2018    Physical Findings: AIMS: No  CIWA:    COWS:     Psychiatric Specialty Exam: General Appearance: Casual; Fairly Groomed   Eye Contact: -- (Improving.)   Speech: Clear and Coherent; Normal Rate   Volume: Normal   Mood: -- (Improving)   Affect: Congruent   Thought Content: Logical   Suicidal Thoughts: Suicidal Thoughts: No   Homicidal Thoughts: Homicidal Thoughts: No   Thought Process: Coherent; Linear   Orientation: Full (Time, Place and Person)     Memory: Immediate Good; Recent Fair; Remote Fair   Judgment: Fair   Insight: Good   Concentration: Good   Recall: Fair   Fund of Knowledge: Fair   Language: Good   Psychomotor Activity: Psychomotor Activity: Normal   Assets: Communication Skills; Desire for Improvement; Housing; Physical Health; Resilience; Social Support   Sleep: Sleep: Good Number of Hours of Sleep: 8    Review of Systems ROS  Vital Signs: Blood pressure 127/77, pulse 85, temperature (!) 97.1 F (36.2 C), temperature source Oral, resp. rate 18, height 5' 11 (1.803 m), weight 69.3 kg, SpO2 100%. Body mass index is 21.31 kg/m. Physical Exam  I certify that inpatient services furnished can reasonably be expected to improve the patient's condition.   Signed: Prentice Espy, MD 07/31/2023, 2:25 PM

## 2023-07-31 NOTE — Plan of Care (Signed)
   Problem: Education: Goal: Emotional status will improve Outcome: Progressing Goal: Mental status will improve Outcome: Progressing

## 2023-07-31 NOTE — Progress Notes (Signed)
   07/31/23 0300  Psych Admission Type (Psych Patients Only)  Admission Status Involuntary  Psychosocial Assessment  Patient Complaints Anxiety  Eye Contact Fair  Facial Expression Animated  Affect Anxious  Speech Logical/coherent  Interaction Assertive  Motor Activity Slow  Appearance/Hygiene Unremarkable  Behavior Characteristics Cooperative  Mood Anxious  Thought Process  Coherency WDL  Content WDL  Delusions None reported or observed  Perception WDL  Hallucination None reported or observed  Judgment Poor  Confusion None  Danger to Self  Current suicidal ideation? Denies  Self-Injurious Behavior No self-injurious ideation or behavior indicators observed or expressed   Agreement Not to Harm Self Yes  Description of Agreement Verbal  Danger to Others  Danger to Others None reported or observed

## 2023-07-31 NOTE — Group Note (Signed)
 Date/Time:  @TD @ 1:00-2:00  Type of Therapy and Topic:  Group Therapy:  Safety  Participation Level:  Did Not Attend   Description of Group This process group involved a discussion with and between patients about Safety.   This group revolves around central idea: You need to stay safe, the patient will learn to cope safely, no matter what negative life events come their way. The patient will identify and describe their safe and unsafe environment.   This then was followed by a discussion about how the patient are committed to their self, what it is, how important it is, and why we choose the coping techniques.  Participants were encouraged to think about commitment as necessary and positive.  Therapeutic Goals Patient will identify and describe one their safety Patient will participate in generating ideas about safety and how it can impact their environment The patient are encourage to explore their safety and who they would want to be their safety person.   Summary of Patient Progress:  The patient did not attend   Therapeutic Modalities Brief Solution-Focused Therapy Psychoeducation Detravion Tester O Sumiye Hirth, LCSWA 07/31/2023  4:52 PM

## 2023-07-31 NOTE — Progress Notes (Signed)
(  Sleep Hours) - 6 hours (Any PRNs that were needed, meds refused, or side effects to meds)- Atarax  25mg  PO, Trazodone  50 mg PO (Any disturbances and when (visitation, overnight)- None (Concerns raised by the patient)- Restlessness/Insomnia (SI/HI/AVH)- Denies

## 2023-07-31 NOTE — Progress Notes (Signed)
   07/31/23 2100  Psych Admission Type (Psych Patients Only)  Admission Status Involuntary  Psychosocial Assessment  Patient Complaints Anxiety  Eye Contact Fair  Facial Expression Animated  Affect Anxious  Speech Logical/coherent  Interaction Assertive  Motor Activity Slow  Appearance/Hygiene Unremarkable  Behavior Characteristics Cooperative  Mood Anxious  Aggressive Behavior  Effect No apparent injury  Thought Process  Coherency WDL  Content WDL  Delusions None reported or observed  Perception WDL  Hallucination None reported or observed  Judgment Poor  Confusion None  Danger to Self  Current suicidal ideation? Denies   (Sleep Hours) -5.75 (Any PRNs that were needed, meds refused, or side effects to meds)- PRN tylenol  trazodone , and hydroxyzine    (Any disturbances and when (visitation, over night)-none (Concerns raised by the patient)- none (SI/HI/AVH)-denied

## 2023-07-31 NOTE — Progress Notes (Signed)
   07/31/23 1300  Psych Admission Type (Psych Patients Only)  Admission Status Involuntary  Psychosocial Assessment  Patient Complaints Anxiety  Eye Contact Fair  Facial Expression Animated  Affect Anxious  Speech Logical/coherent  Interaction Assertive  Motor Activity Slow  Appearance/Hygiene Unremarkable  Behavior Characteristics Cooperative  Mood Anxious  Thought Process  Coherency WDL  Content WDL  Delusions None reported or observed  Perception WDL  Hallucination None reported or observed  Judgment Poor  Confusion None  Danger to Self  Current suicidal ideation? Denies  Danger to Others  Danger to Others None reported or observed

## 2023-07-31 NOTE — B[facility] Group Notes (Signed)
 Adult Psychoeducational Group Note  Date:  07/31/2023 Time:  9:58 AM  Group Topic/Focus:  Goals Group:   The focus of this group is to help patients establish daily goals to achieve during treatment and discuss how the patient can incorporate goal setting into their daily lives to aide in recovery. Orientation:   The focus of this group is to educate the patient on the purpose and policies of crisis stabilization and provide a format to answer questions about their admission.  The group details unit policies and expectations of patients while admitted.  Participation Level:  Did Not Attend  Participation Quality:    Affect:    Cognitive:    Insight:   Engagement in Group:    Modes of Intervention:    Additional Comments:    Jamelle Cassondra KIDD 07/31/2023, 9:58 AM

## 2023-07-31 NOTE — Plan of Care (Signed)

## 2023-08-01 DIAGNOSIS — F200 Paranoid schizophrenia: Secondary | ICD-10-CM

## 2023-08-01 LAB — LITHIUM LEVEL: Lithium Lvl: 0.49 mmol/L — ABNORMAL LOW (ref 0.60–1.20)

## 2023-08-01 MED ORDER — TRAZODONE HCL 50 MG PO TABS: 50.0000 mg | ORAL_TABLET | Freq: Every evening | ORAL | 0 refills | Status: DC | PRN

## 2023-08-01 MED ORDER — CLOZAPINE 100 MG PO TABS: 100.0000 mg | ORAL_TABLET | Freq: Every day | ORAL | 0 refills | Status: DC

## 2023-08-01 MED ORDER — LITHIUM CARBONATE ER 450 MG PO TBCR: 900.0000 mg | EXTENDED_RELEASE_TABLET | Freq: Every day | ORAL | 0 refills | Status: DC

## 2023-08-01 MED ORDER — HYDROXYZINE HCL 25 MG PO TABS: 25.0000 mg | ORAL_TABLET | Freq: Four times a day (QID) | ORAL | 0 refills | Status: DC | PRN

## 2023-08-01 NOTE — Progress Notes (Signed)
 Discharge Note:  Patient denies SI/HI/AVH at this time. Discharge instructions, AVS, prescriptions, and transition record gone over with patient. Patient agrees to comply with medication management, follow-up visit, and outpatient therapy. Patient belongings returned to patient. Patient questions and concerns addressed and answered. Patient ambulatory off unit. Patient discharged to home with parents via taxi. 7 days RX provided to patient.

## 2023-08-01 NOTE — Discharge Summary (Signed)
 Physician Discharge Summary Note Patient:  [name] is an 33 y.o., male MRN:  [id number] DOB:  03-15-1990 Patient phone:  [phone] (home)  Patient address:   [address] [place] [address],  Total Time spent with patient: 1 hour  Date of Admission:  07/27/2023 Date of Discharge: 08/01/23  Reason for Admission: [name] is a 33 yo male w/ hx of paranoid schizophrenia admitted to [facility] due to worsening SI in setting of cocaine use   Hospital Course  During the patient's hospitalization, patient had extensive initial psychiatric evaluation, and follow-up psychiatric evaluations every day.  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 -Continue Hydroxyzine  25 mg po tid prn for anxiety.   -Continue Trazodone  50 mg po Q hs prn for insomnia.    During the hospitalization, other adjustments were made to the patient's psychiatric medication regimen:  -lithium  decreased to ER 600 mg   Patient's care was discussed during the interdisciplinary team meeting every day during the hospitalization.  The patient denies having side effects to prescribed psychiatric medication.  Gradually, patient started adjusting to milieu. The patient was evaluated each day by a clinical provider to ascertain response to treatment. Improvement was noted by the patient's report of decreasing symptoms, improved sleep and appetite, affect, medication tolerance, behavior, and participation in unit programming.  Patient was asked each day to complete a self inventory noting mood, mental status, pain, new symptoms, anxiety and concerns.    Symptoms were reported as significantly decreased or resolved completely by discharge.   On day of  discharge, the patient reports that their mood is stable. The patient denied having suicidal thoughts for more than 48 hours prior to discharge.  Patient denies having homicidal thoughts.  Patient denies having auditory hallucinations.  Patient denies any visual hallucinations or other symptoms of psychosis. The patient was motivated to continue taking medication with a goal of continued improvement in mental health.   The patient reports their target psychiatric symptoms of psychosis responded well to the psychiatric medications, and the patient reports overall benefit other psychiatric hospitalization. Supportive psychotherapy was provided to the patient. The patient also participated in regular group therapy while hospitalized. Coping skills, problem solving as well as relaxation therapies were also part of the unit programming.  Labs were reviewed with the patient, and abnormal results were discussed with the patient.  The patient is able to verbalize their individual safety plan to this provider.  # It is recommended to the patient to continue psychiatric medications as prescribed, after discharge from the hospital.    # It is recommended to the patient to follow up with your outpatient psychiatric provider and PCP.  # It was discussed with the patient, the impact of alcohol, drugs, tobacco have been there overall psychiatric and medical wellbeing, and total abstinence from substance use was recommended the patient.ed.  # Prescriptions provided or sent directly to preferred pharmacy at discharge. Patient agreeable to plan. Given opportunity to ask questions. Appears to feel comfortable with discharge.    # In the event of worsening symptoms, the patient is instructed to call the crisis hotline, 911 and or go to the nearest ED for appropriate evaluation and treatment of symptoms. To follow-up with primary care provider for other medical issues, concerns  and or health care needs  # Patient was  discharged home with a plan to follow up as noted below.    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 No pertinent surgical history.  Family History:  History reviewed. No pertinent family history.  Social History:  Social History   Socioeconomic History   Marital status: Single    Spouse name: Not on file   Number of children: Not on file   Years of education: Not on file   Highest education level: Not on file  Occupational History   Not on file  Tobacco Use   Smoking status: Never   Smokeless tobacco: Never  Substance and Sexual Activity   Alcohol use: Not Currently   Drug use: Yes    Types: Cocaine, Marijuana    Comment: crack 06-26-19   Sexual activity: Yes  Other Topics Concern   Not on file  Social History Narrative   Not on file   Social Drivers of Health   Financial Resource Strain: Not on file  Food Insecurity: Food Insecurity Present (07/27/2023)   Hunger Vital Sign    Worried About Running Out of Food in the Last Year: Sometimes true    Ran Out of Food in the Last Year: Sometimes true  Transportation Needs: Unmet Transportation Needs (07/27/2023)   PRAPARE - Administrator, Civil Service (Medical): Yes    Lack of Transportation (Non-Medical): Yes  Physical Activity: Not on file  Stress: Not on file  Social Connections: Unknown (07/27/2023)   Social Connection and Isolation Panel    Frequency of Communication with Friends and Family: Not on file    Frequency of Social Gatherings with Friends and Family:  Not on file    Attends Religious Services: Not on file    Active Member of Clubs or Organizations: Not on file    Attends Banker Meetings: Not on file    Marital Status: Never married  Intimate Partner Violence: Not At Risk (07/27/2023)   Humiliation, Afraid, Rape, and Kick questionnaire    Fear of Current or Ex-Partner: No    Emotionally Abused: No    Physically Abused: No    Sexually Abused: No     Physical Findings: AIMS:  , ,  ,  ,    CIWA:    COWS:     Musculoskeletal: Strength & Muscle Tone: within normal limits Gait & Station: normal Patient leans: N/A  Psychiatric Specialty Exam  Presentation  General Appearance: Appropriate for Environment; Casual  Eye Contact:Good  Speech:Clear and Coherent; Normal Rate  Speech Volume:Normal   Mood and Affect  Mood:Euthymic  Affect:Appropriate; Congruent   Thought Process  Thought Processes:Coherent; Goal Directed; Linear  Descriptions of Associations:Intact  Orientation:Full (Time, Place and Person)  Thought Content:Logical  Hallucinations:Hallucinations: None  Ideas of Reference:None  Suicidal Thoughts:Suicidal Thoughts: No  Homicidal Thoughts:Homicidal Thoughts: No   Sensorium  Memory:Immediate Good; Recent Good; Remote Good  Judgment:Fair  Insight:Fair   Executive Functions  Concentration:Good  Attention Span:Good  Recall:Good  Fund of Knowledge:Good  Language:Good   Psychomotor Activity  Psychomotor Activity:Psychomotor Activity: Normal   Assets  Assets:Communication Skills; Desire for Improvement; Physical Health   Sleep  Sleep:Sleep: Good   Physical Exam: Physical Exam ROS Blood pressure 125/86, pulse 84, temperature 98 F (36.7 C), temperature source Oral, resp. rate 18, height 5' 11 (1.803 m), weight 69.3 kg, SpO2 100%. Body mass index is 21.31 kg/m.  Social History   Tobacco Use  Smoking Status Never  Smokeless Tobacco Never   Tobacco Cessation:   N/A, patient does not currently use tobacco products  Blood Alcohol level:  Lab Results  Component Value Date   [facility] <15 07/26/2023   ETH <15 05/24/2023    Metabolic Disorder Labs:  Lab Results  Component Value Date   HGBA1C 4.9 07/29/2023   MPG 93.93 07/29/2023   No results found for: PROLACTIN Lab Results  Component Value Date   CHOL 139 07/29/2023   TRIG 106 07/29/2023   HDL 56 07/29/2023   CHOLHDL 2.5 07/29/2023   VLDL 21 07/29/2023   LDLCALC 62 07/29/2023   LDLCALC 62 11/30/2018    See Psychiatric Specialty Exam and Suicide Risk Assessment completed by Attending Physician prior to discharge.  Discharge destination:  Home  Is patient on multiple antipsychotic therapies at discharge:  No   Has Patient had three or more failed trials of antipsychotic monotherapy by history:  No  Recommended Plan for Multiple Antipsychotic Therapies: NA  Discharge Instructions     Diet - low sodium heart healthy   Complete by: As directed    Increase activity slowly   Complete by: As directed       Allergies as of 08/01/2023       Reactions   Ibuprofen Swelling   Tongue swelling   Risperidone And Paliperidone Other (See Comments), Swelling   gynecomastia gynecomastia   Ziprasidone  Swelling   Tongue swells   Benztropine  Other (See Comments)   Causes confusion, depression, and delusions   Quetiapine  Other (See Comments)   Depression, suicidality, adverse effect: seizures         Medication List     TAKE these medications      Indication  cloZAPine  100 MG tablet Commonly known as: CLOZARIL  Take 1 tablet (100 mg total) by mouth at bedtime. What changed:  medication strength how much to take  Indication: Manic-Depression, Schizophrenia   hydrOXYzine  25 MG tablet Commonly known as: ATARAX  Take 1 tablet (25 mg total) by mouth every 6 (six) hours as needed for anxiety.  Indication: Feeling Anxious   lithium  carbonate 450 MG ER tablet Commonly known as:  ESKALITH  Take 2 tablets (900 mg total) by mouth at bedtime. What changed:  medication strength how much to take  Indication: Schizoaffective Disorder   senna 8.6 MG Tabs tablet Commonly known as: SENOKOT Take 2 tablets (17.2 mg total) by mouth at bedtime.  Indication: Constipation   traZODone  50 MG tablet Commonly known as: DESYREL  Take 1 tablet (50 mg total) by mouth at bedtime as needed for sleep.  Indication: Trouble Sleeping        Follow-up [employer]. Schedule an appointment as soon as possible for a visit.   Contact information: [address]  [address] [place] [address] [phone]                  Follow-up recommendations:   Activity:  as tolerated Diet:  heart healthy   Comments:  Prescriptions were given at discharge.  Patient is agreeable with the discharge plan.  Patient was given an opportunity to ask questions.  Patient appears to feel comfortable with discharge and denies any current suicidal or homicidal thoughts.    Patient is instructed prior to discharge to: Take all medications as prescribed by mental healthcare provider. Report any adverse effects and or reactions from the medicines to outpatient provider promptly. In the event of worsening symptoms, patient is instructed to call the crisis hotline, 911 and or go to the nearest ED for appropriate evaluation and treatment of symptoms. Patient is to follow-up with primary care provider for other medical issues, concerns and or health care needs.     Signed: Prentice Espy, MD 08/01/2023, 8:41 AM

## 2023-08-01 NOTE — Progress Notes (Signed)
  [facility] Adult Case Management Discharge Plan :  Will you be returning to the same living situation after discharge:  Yes,  home  At discharge, do you have transportation home?: Yes,  taxi Do you have the ability to pay for your medications: Yes,  the patient has insurance  Release of information consent forms completed and in the chart;  Patient's signature needed at discharge.  Patient to Follow up at:  Follow-up Information     [facility]. Schedule an appointment as soon as possible for a visit.   Why: CSW inform patient of Address and phone number for patient to follow up on his own to [facility] . Contact information: [address]  [address] [place] [address] [phone]         [facility]. Schedule an appointment as soon as possible for a visit.   Contact information: [address] [place] [address] [phone]                 Next level of care provider has access to [facility] Link:no  Safety Planning and Suicide Prevention discussed: Yes,  CSW called and spoke to the patient's mother     Has patient been referred to the Quitline?: Patient refused referral for treatment  Patient has been referred for addiction treatment: Patient refused referral for treatment; referral information given to patient at discharge.  Jillene Wehrenberg O Lisette Mancebo, LCSWA 08/01/2023, 9:49 AM

## 2023-08-01 NOTE — Plan of Care (Signed)
   Problem: Education: Goal: Knowledge of [place] [facility] Education information/materials will improve Outcome: Progressing Goal: Emotional status will improve Outcome: Progressing

## 2023-08-01 NOTE — Hospital Course (Signed)
 Reason for Admission: [name] is a 33 yo male w/ hx of paranoid schizophrenia admitted to [facility] due to worsening SI in setting of cocaine use   Hospital Course  During the patient's hospitalization, patient had extensive initial psychiatric evaluation, and follow-up psychiatric evaluations every day.  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 -Continue Hydroxyzine  25 mg po tid prn for anxiety.   -Continue Trazodone  50 mg po Q hs prn for insomnia.    During the hospitalization, other adjustments were made to the patient's psychiatric medication regimen:  -lithium  decreased to ER 600 mg   Patient's care was discussed during the interdisciplinary team meeting every day during the hospitalization.  The patient denies having side effects to prescribed psychiatric medication.  Gradually, patient started adjusting to milieu. The patient was evaluated each day by a clinical provider to ascertain response to treatment. Improvement was noted by the patient's report of decreasing symptoms, improved sleep and appetite, affect, medication tolerance, behavior, and participation in unit programming.  Patient was asked each day to complete a self inventory noting mood, mental status, pain, new symptoms, anxiety and concerns.    Symptoms were reported as significantly decreased or resolved completely by discharge.   On day of discharge, the patient reports that their mood is stable. The patient denied having suicidal thoughts for more than 48 hours prior to discharge.  Patient denies having homicidal thoughts.  Patient denies having auditory hallucinations.  Patient denies any visual hallucinations or other symptoms of psychosis. The  patient was motivated to continue taking medication with a goal of continued improvement in mental health.   The patient reports their target psychiatric symptoms of psychosis responded well to the psychiatric medications, and the patient reports overall benefit other psychiatric hospitalization. Supportive psychotherapy was provided to the patient. The patient also participated in regular group therapy while hospitalized. Coping skills, problem solving as well as relaxation therapies were also part of the unit programming.  Labs were reviewed with the patient, and abnormal results were discussed with the patient.  The patient is able to verbalize their individual safety plan to this provider.  # It is recommended to the patient to continue psychiatric medications as prescribed, after discharge from the hospital.    # It is recommended to the patient to follow up with your outpatient psychiatric provider and PCP.  # It was discussed with the patient, the impact of alcohol, drugs, tobacco have been there overall psychiatric and medical wellbeing, and total abstinence from substance use was recommended the patient.ed.  # Prescriptions provided or sent directly to preferred pharmacy at discharge. Patient agreeable to plan. Given opportunity to ask questions. Appears to feel comfortable with discharge.    # In the event of worsening symptoms, the patient is instructed to call the crisis hotline, 911 and or go to the nearest ED for appropriate evaluation and treatment of symptoms. To follow-up with primary care provider for other medical issues, concerns and or health care needs  # Patient was discharged home with a plan to follow up as noted below.

## 2023-08-01 NOTE — BHH Suicide Risk Assessment (Addendum)
 [facility] Discharge Suicide Risk Assessment   Principal Problem: Schizophrenia, paranoid (HCC) Discharge Diagnoses: Principal Problem:   Schizophrenia, paranoid (HCC) Active Problems:   Cocaine abuse with cocaine-induced mood disorder (HCC)   Schizoaffective disorder, bipolar type (HCC)   Schizophrenia, acute (HCC)   Reason for Admission: [name] is a 33 yo male w/ hx of paranoid schizophrenia admitted to [facility] due to worsening SI in setting of cocaine use   Hospital Course  During the patient's hospitalization, patient had extensive initial psychiatric evaluation, and follow-up psychiatric evaluations every day.  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 -Continue Hydroxyzine  25 mg po tid prn for anxiety.   -Continue Trazodone  50 mg po Q hs prn for insomnia.    During the hospitalization, other adjustments were made to the patient's psychiatric medication regimen:  -lithium  decreased to ER 600 mg   Patient's care was discussed during the interdisciplinary team meeting every day during the hospitalization.  The patient denies having side effects to prescribed psychiatric medication.  Gradually, patient started adjusting to milieu. The patient was evaluated each day by a clinical provider to ascertain response to treatment. Improvement was noted by the patient's report of decreasing symptoms, improved sleep and appetite, affect, medication tolerance, behavior, and participation in unit programming.  Patient was asked each day to complete a self inventory noting mood, mental status, pain, new symptoms, anxiety and concerns.    Symptoms were reported as significantly decreased or resolved completely by discharge.   On  day of discharge, the patient reports that their mood is stable. The patient denied having suicidal thoughts for more than 48 hours prior to discharge.  Patient denies having homicidal thoughts.  Patient denies having auditory hallucinations.  Patient denies any visual hallucinations or other symptoms of psychosis. The patient was motivated to continue taking medication with a goal of continued improvement in mental health.   The patient reports their target psychiatric symptoms of psychosis responded well to the psychiatric medications, and the patient reports overall benefit other psychiatric hospitalization. Supportive psychotherapy was provided to the patient. The patient also participated in regular group therapy while hospitalized. Coping skills, problem solving as well as relaxation therapies were also part of the unit programming.  Labs were reviewed with the patient, and abnormal results were discussed with the patient.  The patient is able to verbalize their individual safety plan to this provider.  # It is recommended to the patient to continue psychiatric medications as prescribed, after discharge from the hospital.    # It is recommended to the patient to follow up with your outpatient psychiatric provider and PCP.  # It was discussed with the patient, the impact of alcohol, drugs, tobacco have been there overall psychiatric and medical wellbeing, and total abstinence from substance use was recommended the patient.ed.  # Prescriptions provided or sent directly to preferred pharmacy at discharge. Patient agreeable to plan. Given opportunity to ask questions. Appears to feel comfortable with discharge.    # In the event of worsening symptoms, the patient is instructed to call the crisis hotline, 911 and or go to the nearest ED for appropriate evaluation and treatment of symptoms. To follow-up with primary care provider for other medical issues, concerns and or health care needs  #  Patient  was discharged home with a plan to follow up as noted below.   Total Time spent with patient: 1 hour  Musculoskeletal: Strength & Muscle Tone: within normal limits Gait & Station: normal Patient leans: N/A  Psychiatric Specialty Exam  Presentation  General Appearance: Appropriate for Environment; Casual   Eye Contact:Good   Speech:Clear and Coherent; Normal Rate   Speech Volume:Normal   Handedness:Right    Mood and Affect  Mood:Euthymic   Duration of Depression Symptoms: Greater than two weeks   Affect:Appropriate; Congruent    Thought Process  Thought Processes:Coherent; Goal Directed; Linear   Descriptions of Associations:Intact   Orientation:Full (Time, Place and Person)   Thought Content:Logical   History of Schizophrenia/Schizoaffective disorder:No   Duration of Psychotic Symptoms:Greater than six months   Hallucinations:Hallucinations: None  Ideas of Reference:None   Suicidal Thoughts:Suicidal Thoughts: No  Homicidal Thoughts:Homicidal Thoughts: No   Sensorium  Memory:Immediate Good; Recent Good; Remote Good   Judgment:Fair   Insight:Fair    Executive Functions  Concentration:Good   Attention Span:Good   Recall:Good   Fund of Knowledge:Good   Language:Good    Psychomotor Activity  Psychomotor Activity:Psychomotor Activity: Normal   Assets  Assets:Communication Skills; Desire for Improvement; Physical Health    Sleep  Sleep:Sleep: Good   Physical Exam: Physical Exam ROS Blood pressure 125/86, pulse 84, temperature 98 F (36.7 C), temperature source Oral, resp. rate 18, height 5' 11 (1.803 m), weight 69.3 kg, SpO2 100%. Body mass index is 21.31 kg/m.  Mental Status Per Nursing Assessment::   On Admission:  Suicidal ideation indicated by others  Demographic Factors:  Male, Low socioeconomic status, and Unemployed  Loss Factors: NA  Historical Factors: Impulsivity  Risk  Reduction Factors:   Living with another person, especially a relative, Positive social support, Positive therapeutic relationship, and Positive coping skills or problem solving skills  Continued Clinical Symptoms:  Alcohol/Substance Abuse/Dependencies More than one psychiatric diagnosis Previous Psychiatric Diagnoses and Treatments  Cognitive Features That Contribute To Risk:  None    Suicide Risk:  Minimal: No identifiable suicidal ideation.  Patients presenting with no risk factors but with morbid ruminations; may be classified as minimal risk based on the severity of the depressive symptoms   Follow-up Information     [facility]. Schedule an appointment as soon as possible for a visit.   Contact information: [address]  [address] [place] [address] [phone]                 Plan Of Care/Follow-up recommendations:  Activity: as tolerated  Diet: heart healthy  Other: -Follow-up with your outpatient psychiatric provider -instructions on appointment date, time, and address (location) are provided to you in discharge paperwork.  -Take your psychiatric medications as prescribed at discharge - instructions are provided to you in the discharge paperwork  -Follow-up with outpatient primary care doctor and other specialists -for management of chronic medical disease, including: none  -Testing: Follow-up with outpatient provider for abnormal lab results: none  -Recommend abstinence from alcohol, tobacco, and other illicit drug use at discharge.   -If your psychiatric symptoms recur, worsen, or if you have side effects to your psychiatric medications, call your outpatient psychiatric provider, 911, [phone] or go to the nearest emergency department.  -If suicidal thoughts recur, call your outpatient psychiatric provider, 911, [phone] or go to the nearest emergency department.   Prentice Espy, MD 08/01/2023, 8:41 AM

## 2023-08-01 NOTE — BHH Counselor (Signed)
 After CSW spoke to  patient, the patient verify that he is a patient at [facility] behavioral health

## 2023-08-01 NOTE — Progress Notes (Signed)
   08/01/23 0900  Psych Admission Type (Psych Patients Only)  Admission Status Involuntary  Psychosocial Assessment  Patient Complaints None  Eye Contact Fair  Facial Expression Animated  Affect Anxious (ready to leave)  Speech Logical/coherent  Interaction Assertive  Motor Activity Other (Comment) (WNL)  Appearance/Hygiene Unremarkable  Behavior Characteristics Appropriate to situation;Cooperative  Mood Pleasant;Anxious  Thought Process  Coherency WDL  Content WDL  Delusions None reported or observed  Perception WDL  Hallucination None reported or observed  Judgment Impaired  Confusion None  Danger to Self  Current suicidal ideation? Denies

## 2023-08-01 NOTE — BHH Suicide Risk Assessment (Signed)
 BHH INPATIENT:  Family/Significant Other Suicide Prevention Education  Suicide Prevention Education:  Education Completed; [name],  (mom [phone]) has been identified by the patient as the family member/significant other with whom the patient will be residing, and identified as the person(s) who will aid the patient in the event of a mental health crisis (suicidal ideations/suicide attempt).  With written consent from the patient, the family member/significant other has been provided the following suicide prevention education, prior to the and/or following the discharge of the patient.  The suicide prevention education provided includes the following: Suicide risk factors Suicide prevention and interventions National Suicide Hotline telephone number [facility] assessment telephone number [facility] Emergency Assistance 911 [facility] and/or Residential Mobile Crisis Unit telephone number  Request made of family/significant other to: Remove weapons (e.g., guns, rifles, knives), all items previously/currently identified as safety concern.   Remove drugs/medications (over-the-counter, prescriptions, illicit drugs), all items previously/currently identified as a safety concern.  The family member/significant other verbalizes understanding of the suicide prevention education information provided.  The family member/significant other agrees to remove the items of safety concern listed above.  [name] 08/01/2023, 9:40 AM

## 2023-08-02 NOTE — Care Management (Signed)
 Social work was informed on 07/30/23 that patient would Not be discharging over the weekend so an appointment with [facility] was not obtained.  There was no date and time for the patient to call [facility] (which is necessary).  Also, [facility] which was added to follow up does not accept the patient's insurance.  IF THERE IS ANY CHANCE THAT A PATIENT MAY DISCHARGE OVER THE WEEKEND, SOCIAL WORK MUST BE INFORMED (OR HOLD PATIENT TILL MONDAY).

## 2023-08-05 ENCOUNTER — Encounter: Payer: Self-pay | Admitting: Emergency Medicine

## 2023-08-05 ENCOUNTER — Emergency Department
Admission: EM | Admit: 2023-08-05 | Discharge: 2023-08-05 | Disposition: A | Discharge status: Home / Self Care | Attending: Emergency Medicine | Admitting: Emergency Medicine

## 2023-08-05 ENCOUNTER — Other Ambulatory Visit: Payer: Self-pay

## 2023-08-05 DIAGNOSIS — R7989 Other specified abnormal findings of blood chemistry: Secondary | ICD-10-CM | POA: Insufficient documentation

## 2023-08-05 DIAGNOSIS — R7401 Elevation of levels of liver transaminase levels: Secondary | ICD-10-CM | POA: Diagnosis not present

## 2023-08-05 DIAGNOSIS — F250 Schizoaffective disorder, bipolar type: Secondary | ICD-10-CM

## 2023-08-05 DIAGNOSIS — Z79899 Other long term (current) drug therapy: Secondary | ICD-10-CM | POA: Diagnosis not present

## 2023-08-05 DIAGNOSIS — R45851 Suicidal ideations: Secondary | ICD-10-CM | POA: Insufficient documentation

## 2023-08-05 LAB — COMPREHENSIVE METABOLIC PANEL WITH GFR
ALT: 130 U/L — ABNORMAL HIGH (ref 0–44)
AST: 48 U/L — ABNORMAL HIGH (ref 15–41)
Albumin: 4.2 g/dL (ref 3.5–5.0)
Alkaline Phosphatase: 89 U/L (ref 38–126)
Anion gap: 11 (ref 5–15)
BUN: 23 mg/dL — ABNORMAL HIGH (ref 6–20)
CO2: 25 mmol/L (ref 22–32)
Calcium: 9.1 mg/dL (ref 8.9–10.3)
Chloride: 105 mmol/L (ref 98–111)
Creatinine, Ser: 0.73 mg/dL (ref 0.61–1.24)
GFR, Estimated: >60 mL/min (ref >60)
Glucose, Bld: 96 mg/dL (ref 70–99)
Potassium: 3.7 mmol/L (ref 3.5–5.1)
Sodium: 141 mmol/L (ref 135–145)
Total Bilirubin: 1.2 mg/dL (ref 0.0–1.2)
Total Protein: 8.4 g/dL — ABNORMAL HIGH (ref 6.5–8.1)

## 2023-08-05 LAB — CBC WITH DIFFERENTIAL/PLATELET
Abs Immature Granulocytes: 0.03 K/uL (ref 0.00–0.07)
Basophils Absolute: 0 K/uL (ref 0.0–0.1)
Basophils Relative: 0 %
Eosinophils Absolute: 0.1 K/uL (ref 0.0–0.5)
Eosinophils Relative: 1 %
HCT: 38.4 % — ABNORMAL LOW (ref 39.0–52.0)
Hemoglobin: 12.9 g/dL — ABNORMAL LOW (ref 13.0–17.0)
Immature Granulocytes: 0 %
Lymphocytes Relative: 36 %
Lymphs Abs: 2.7 K/uL (ref 0.7–4.0)
MCH: 29.6 pg (ref 26.0–34.0)
MCHC: 33.6 g/dL (ref 30.0–36.0)
MCV: 88.1 fL (ref 80.0–100.0)
Monocytes Absolute: 1.1 K/uL — ABNORMAL HIGH (ref 0.1–1.0)
Monocytes Relative: 15 %
Neutro Abs: 3.6 K/uL (ref 1.7–7.7)
Neutrophils Relative %: 48 %
Platelets: 256 K/uL (ref 150–400)
RBC: 4.36 MIL/uL (ref 4.22–5.81)
RDW: 13.5 % (ref 11.5–15.5)
WBC: 7.5 K/uL (ref 4.0–10.5)
nRBC: 0 % (ref 0.0–0.2)

## 2023-08-05 LAB — CBC
HCT: 38.8 % — ABNORMAL LOW (ref 39.0–52.0)
Hemoglobin: 12.8 g/dL — ABNORMAL LOW (ref 13.0–17.0)
MCH: 29.2 pg (ref 26.0–34.0)
MCHC: 33 g/dL (ref 30.0–36.0)
MCV: 88.4 fL (ref 80.0–100.0)
Platelets: 256 K/uL (ref 150–400)
RBC: 4.39 MIL/uL (ref 4.22–5.81)
RDW: 13.5 % (ref 11.5–15.5)
WBC: 7.6 K/uL (ref 4.0–10.5)
nRBC: 0 % (ref 0.0–0.2)

## 2023-08-05 LAB — SALICYLATE LEVEL: Salicylate Lvl: <7 mg/dL — ABNORMAL LOW (ref 7.0–30.0)

## 2023-08-05 LAB — LITHIUM LEVEL: Lithium Lvl: <0.06 mmol/L — ABNORMAL LOW (ref 0.60–1.20)

## 2023-08-05 LAB — ETHANOL: Alcohol, Ethyl (B): <15 mg/dL (ref <15)

## 2023-08-05 LAB — ACETAMINOPHEN LEVEL: Acetaminophen (Tylenol), Serum: <10 ug/mL — ABNORMAL LOW (ref 10–30)

## 2023-08-05 MED ORDER — HALOPERIDOL LACTATE 5 MG/ML IJ SOLN
INTRAMUSCULAR | Status: AC
  Filled 2023-08-05: qty 1

## 2023-08-05 MED ORDER — CLOZAPINE 100 MG PO TABS: 100.0000 mg | ORAL_TABLET | Freq: Every day | ORAL | Status: DC

## 2023-08-05 MED ORDER — LORAZEPAM 2 MG/ML IJ SOLN
INTRAMUSCULAR | Status: AC
  Filled 2023-08-05: qty 1

## 2023-08-05 MED ORDER — TRAZODONE HCL 50 MG PO TABS: 50.0000 mg | ORAL_TABLET | Freq: Every evening | ORAL | Status: DC | PRN

## 2023-08-05 MED ORDER — DIPHENHYDRAMINE HCL 50 MG/ML IJ SOLN
INTRAMUSCULAR | Status: AC
  Filled 2023-08-05: qty 1

## 2023-08-05 MED ORDER — SENNA 8.6 MG PO TABS: 2.0000 | ORAL_TABLET | Freq: Every day | ORAL | Status: DC

## 2023-08-05 MED ORDER — LITHIUM CARBONATE ER 450 MG PO TBCR: 900.0000 mg | EXTENDED_RELEASE_TABLET | Freq: Every day | ORAL | Status: DC

## 2023-08-05 MED ORDER — HYDROXYZINE HCL 25 MG PO TABS: 25.0000 mg | ORAL_TABLET | Freq: Four times a day (QID) | ORAL | Status: DC | PRN

## 2023-08-05 NOTE — ED Provider Notes (Signed)
 Psychiatrist told me pt was clear for dc.    Ernest Ronal BRAVO, MD 08/05/23 [phone]

## 2023-08-05 NOTE — Progress Notes (Addendum)
 [facility] contacted [facility] to confirm patient's last visit and to inquire about ACT Services.  [facility] spoke to [employer] (ACT Team Lead [phone]).  Patient's last visit was February 2025, therefore patient would need to come in to be assessed first.  After the assessment, [facility] will determine if patient is eligible for ACT services.   Patient was psych cleared and discharged.  [facility] scheduled outpatient appointment with [facility] [phone]) for Monday, July 28th at 11AM. Patient's mother Burgess Nightingale) answered patient's phone and was given appointment details for the [facility] follow-up outpatient services.   [place], [facility] [phone]

## 2023-08-05 NOTE — ED Triage Notes (Addendum)
 Pt brought in by [facility] PD, st he called the ambulance to bring him because he doesn't want to be out there; denies SI or HI

## 2023-08-05 NOTE — Discharge Instructions (Signed)
Cleared by psych 

## 2023-08-05 NOTE — BH Assessment (Signed)
 Pt is currently too somnolent to participate in assessment.  TTS will follow up with assessment at later time

## 2023-08-05 NOTE — ED Notes (Signed)
 Pt given safe items for shower at this time.

## 2023-08-05 NOTE — BH Assessment (Signed)
 Comprehensive Clinical Assessment (CCA) Screening, Triage and Referral Note  08/05/2023 [name] [id number]  Chief Complaint:  Chief Complaint  Patient presents with   Mental Health Problem   Visit Diagnosis: Schizophrenia.  Came to the ER due to passive SI and not wanting to sleep outside.  [name] is a 33 year old male who presents to the ER due to passive SI and not wanting to sleep outside the night he arrived to the ER. He states he lives with his mother and sister and they were arguing. He didn't want to get involved in what they had going on, so he left. The patient is well known to the ER and recently discharged from [facility]. He was admitted for Schizophrenia, paranoid and at that time he wasn't stable or safe to discharge, like he is with the current ER visit.   Patient Reported Information How did you hear about us ? Self  What Is the Reason for Your Visit/Call Today? Came to the ER due to passive SI and not wanting to sleep outside.  How Long Has This Been Causing You Problems? <Week  What Do You Feel Would Help You the Most Today? Treatment for Depression or other mood problem; Alcohol or Drug Use Treatment   Have You Recently Had Any Thoughts About Hurting Yourself? No  Are You Planning to Commit Suicide/Harm Yourself At This time? No   Have you Recently Had Thoughts About Hurting Someone [name]? No  Are You Planning to Harm Someone at This Time? No  Explanation: Pt was calm and cooperative   Have You Used Any Alcohol or Drugs in the Past 24 Hours? Yes  How Long Ago Did You Use Drugs or Alcohol? Unable to quantify  What Did You Use and How Much? Cocaine   Do You Currently Have a Therapist/Psychiatrist? No  Name of Therapist/Psychiatrist: [facility]   Have You Been Recently Discharged From Any Office Practice or Programs? No  Explanation of Discharge From Practice/Program: No data recorded   CCA Screening Triage Referral Assessment Type of  Contact: Face-to-Face  Telemedicine Service Delivery:   Is this Initial or Reassessment?   Date Telepsych consult ordered in [facility]:    Time Telepsych consult ordered in [facility]:    Location of Assessment: [facility] ED  Provider Location: [facility] ED    Collateral Involvement: None provided   Does Patient Have a Court Appointed Legal Guardian? No data recorded Name and Contact of Legal Guardian: No data recorded If Minor and Not Living with Parent(s), Who has Custody? n/a  Is CPS involved or ever been involved? Never  Is APS involved or ever been involved? Never   Patient Determined To Be At Risk for Harm To Self or Others Based on Review of Patient Reported Information or Presenting Complaint? No  Method: Plan without intent  Availability of Means: No access or NA  Intent: Vague intent or NA  Notification Required: No need or identified person  Additional Information for Danger to Others Potential: -- (n/a)  Additional Comments for Danger to Others Potential: n/a  Are There Guns or Other Weapons in Your Home? No  Types of Guns/Weapons: Denies access to guns/ weapons  Are These Weapons Safely Secured?                            No  Who Could Verify You Are Able To Have These Secured: Denies access to guns/ weapons  Do You Have any  Outstanding Charges, Pending Court Dates, Parole/Probation? Patient reports pending legal charges; upcoming court date.  Contacted To Inform of Risk of Harm To Self or Others: -- (n/a)   Does Patient Present under Involuntary Commitment? No    County of Residence: [place]   Patient Currently Receiving the Following Services: Not Receiving Services   Determination of Need: Emergent (2 hours)   Options For Referral: ED Visit   Disposition Recommendation per psychiatric provider: Outpatient Treatment   Greg DOROTHA Barge MS, LCAS, [facility], [facility] Therapeutic Triage Specialist 08/05/2023 2:57 PM

## 2023-08-05 NOTE — ED Notes (Signed)
 VOL/pending psych consult

## 2023-08-05 NOTE — ED Notes (Signed)
 Pt discharged at this time. RN reviewed discharge instructions with pt. Pt verbalized understanding. Vital signs taken. RR even and unlabored. Pt denies any questions or needs at this time. Pt ambulatory at discharge with all belongings. Pt given bus pass. Pt states he is going to take bus pass home.

## 2023-08-05 NOTE — ED Notes (Addendum)
 With male officer present, pt removes black slip on shoes, black shorts, 2 pairs of grey boxers, black t-shirt--all placed in labeled pt belonging bag to be secured on nursing station and pt changed into behav scrubs.

## 2023-08-05 NOTE — Consult Note (Signed)
 [facility] Psychiatric Consult Initial  Patient Name: .[name]  MRN: [id number]  DOB: [birth year]  Consult Order details:  Orders (From admission, onward)     Start     Ordered   07/26/23 0809  CONSULT TO CALL ACT TEAM       Ordering Provider: Claudene Rover, MD  Provider:  (Not yet assigned)  Question:  Reason for Consult?  Answer:  Psych consult   07/26/23 0808   07/26/23 0809  IP CONSULT TO PSYCHIATRY       Ordering Provider: Claudene Rover, MD  Provider:  (Not yet assigned)  Question Answer Comment  Place call to: psych   Reason for Consult Admit      07/26/23 0808             Mode of Visit: In person    Psychiatry Consult Evaluation  Service Date: August 05, 2023 LOS:  LOS: 0 days  Chief Complaint: It is not safe all their  Primary Psychiatric Diagnoses  Schizoaffective disorder,bipolar type   Assessment  [name] is a 33 y.o. male admitted: Presented to the [facility], 33 year old male who presents to [facility] ED voluntarily due to passive SI and not wanting to sleep outside the night he arrived to the ER. He states he lives with his mother and sister and they were arguing. He didn't want to get involved in what they had going on, so he left. The patient is well known to the ER and recently discharged from [facility]. He was admitted for Schizophrenia, paranoid and at that time he wasn't stable or safe to discharge, like he is with the current ER visit.   On assessment patient is calm and cooperative, denies SI/HI/plan and denies hallucinations.  Patient is noted to be at his baseline mental status.  No indication for inpatient psychiatric admission at this time.  Patient is recommended to follow-up with [facility] ACT team.  Patient reports that he can go back to his mom's house.  Patient is psychiatrically cleared for discharge at this time     Diagnoses:  Active Hospital problems: Active Problems:   * No active hospital problems. *    Plan   ##  Psychiatric Medication Recommendations:  Continue home medications  ## Medical Decision Making Capacity: Not specifically addressed in this encounter  ## Further Work-up:   -- most recent EKG on 07/26/2023 had QtC of 453 -- Pertinent labwork reviewed earlier this admission includes: CBC CMP   ## Disposition:--No psychiatric contraindication for discharge at this time  ## Behavioral / Environmental: -Utilize compassion and acknowledge the patient's experiences while setting clear and realistic expectations for care.    ## Safety and Observation Level:  - Based on my clinical evaluation, I estimate the patient to be at low risk of self harm in the current setting. - At this time, we recommend  routine. This decision is based on my review of the chart including patient's history and current presentation, interview of the patient, mental status examination, and consideration of suicide risk including evaluating suicidal ideation, plan, intent, suicidal or self-harm behaviors, risk factors, and protective factors. This judgment is based on our ability to directly address suicide risk, implement suicide prevention strategies, and develop a safety plan while the patient is in the clinical setting. Please contact our team if there is a concern that risk level has changed.  CSSR Risk Category:C-SSRS RISK CATEGORY: No Risk  Suicide Risk Assessment: Patient has following modifiable  risk factors for suicide: medication noncompliance, which we are addressing by intensive outpatient services through [facility] Patient has following non-modifiable or demographic risk factors for suicide: male gender Patient has the following protective factors against suicide: no history of NSSIB  Thank you for this consult request. Recommendations have been communicated to the primary team.  We will sign off at this time.   Allyn Foil, MD       History of Present Illness  Relevant Aspects of Hospital ED   On  interview patient is noted to be resting in bed.  He reports that he took shower and is feeling better.  He then talks about living out though he has not fallen.  He then states that he was at his mom's house with his sister and they both were arguing.  He reports that he did not want to get him be doing those to conduct the house.  He did acknowledge that he was recently discharged from the behavioral health unit in [place].  He reports that he has not been taking his medications.  He did acknowledge that he was supposed to follow-up at [facility] ACT team.  Provider educated about the importance of outpatient follow-up.  Patient has multiple inpatient psychiatric hospitalizations but fails to follow-up outpatient.  He denies SI/HI/plan.  He makes comments that he likes to come to the emergency room as he loves his all and feels good in the emergency room.  He denies auditory/visual hallucinations.  He is not displaying any florid delusions and is not responding to internal stimuli.    Psychiatric and Social History  Psychiatric History:  Information collected from patient and chart  Prev Dx/Sx: Schizoaffective disorder Current Psych Provider: None reported Home Meds (current): Clozaril  and lithium  Previous Med Trials: Unable to recall Therapy: Unknown  Prior Psych Hospitalization: Per chart most recent admission is May 2025 at [facility] Prior Self Harm: Denies Prior Violence: Unknown  Family Psych History: Unknown Family Hx suicide: Unknown  Social History:   Educational Hx: Unknown Occupational Hx: Unemployed Legal Hx: Was in jail recently and is out on bail Living Situation: Homeless Spiritual Hx: None reported Access to weapons/lethal means: None reported  Substance History Alcohol: None reported  Tobacco: None reported Illicit drugs: Unable to obtain but UDS positive for benzos and cocaine Prescription drug abuse: Unknown Rehab hx: Unknown  Exam Findings  Physical Exam:  Reviewed and agree with the physical exam findings conducted by the medical provider Vital Signs:  Temp:  [98 F (36.7 C)-98.2 F (36.8 C)] 98 F (36.7 C) (07/24 1206) Pulse Rate:  [70-74] 70 (07/24 1206) Resp:  [18] 18 (07/24 1206) BP: (107-140)/(59-80) 110/62 (07/24 1206) SpO2:  [98 %-99 %] 98 % (07/24 1206) Weight:  [77 kg] 77 kg (07/24 0205) Blood pressure 110/62, pulse 70, temperature 98 F (36.7 C), resp. rate 18, height 5' 11 (1.803 m), weight 77 kg, SpO2 98%. Body mass index is 23.68 kg/m.    Mental Status Exam: General Appearance: Disheveled  Orientation:  Negative  Memory:  Immediate;   Poor Recent;   Poor Remote;   Poor  Concentration: Fair  Recall:  Fair  Attention fair  Eye Contact: Normal  Speech:  Slow  Language:  Fair  Volume:  Normal  Mood: fine  Affect: Stable  Thought Process: Coherent  Thought Content:  Illogical at baseline  Suicidal Thoughts: Denies  Homicidal Thoughts:  No  Judgement:  limited  Insight:  limited  Psychomotor Activity:  Increased  Akathisia:  No  Fund of Knowledge:  Fair      Assets:  Desire for Improvement  Cognition:  WNL  ADL's:  Intact  AIMS (if indicated):        Other History   These have been pulled in through the EMR, reviewed, and updated if appropriate.  Family History:  The patient's family history is not on file.  Medical History: Past Medical History:  Diagnosis Date   B12 deficiency 05/27/2023   Folate deficiency 05/27/2023   Schizo affective schizophrenia (HCC)    Schizophrenia (HCC)     Surgical History: History reviewed. No pertinent surgical history.   Medications:   Current Facility-Administered Medications:    cloZAPine  (CLOZARIL ) tablet 100 mg, 100 mg, Oral, QHS, Ward, Kristen N, DO   hydrOXYzine  (ATARAX ) tablet 25 mg, 25 mg, Oral, Q6H PRN, Ward, Kristen N, DO   lithium  carbonate (ESKALITH ) ER tablet 900 mg, 900 mg, Oral, QHS, Ward, Kristen N, DO   senna (SENOKOT) tablet 17.2 mg, 2  tablet, Oral, QHS, Ward, Kristen N, DO   traZODone  (DESYREL ) tablet 50 mg, 50 mg, Oral, QHS PRN, Ward, Josette SAILOR, DO  Current Outpatient Medications:    cloZAPine  (CLOZARIL ) 100 MG tablet, Take 1 tablet (100 mg total) by mouth at bedtime., Disp: 30 tablet, Rfl: 0   hydrOXYzine  (ATARAX ) 25 MG tablet, Take 1 tablet (25 mg total) by mouth every 6 (six) hours as needed for anxiety., Disp: 90 tablet, Rfl: 0   lithium  carbonate (ESKALITH ) 450 MG ER tablet, Take 2 tablets (900 mg total) by mouth at bedtime., Disp: 60 tablet, Rfl: 0   senna (SENOKOT) 8.6 MG TABS tablet, Take 2 tablets (17.2 mg total) by mouth at bedtime., Disp: 120 tablet, Rfl: 0   traZODone  (DESYREL ) 50 MG tablet, Take 1 tablet (50 mg total) by mouth at bedtime as needed for sleep., Disp: 30 tablet, Rfl: 0  Allergies: Allergies  Allergen Reactions   Ibuprofen Swelling    Tongue swelling   Risperidone And Paliperidone Other (See Comments) and Swelling    gynecomastia gynecomastia    Ziprasidone  Swelling    Tongue swells   Benztropine  Other (See Comments)    Causes confusion, depression, and delusions   Quetiapine  Other (See Comments)    Depression, suicidality, adverse effect: seizures     Allyn Foil, MD

## 2023-08-05 NOTE — BH Assessment (Signed)
 Writer contacted [facility] providers via phone and sent a secure chat to request a psychiatric consult.

## 2023-08-05 NOTE — ED Provider Notes (Signed)
 [facility] Provider Note    Event Date/Time   First MD Initiated Contact with Patient 08/05/23 0222     (approximate)   History   Mental Health Problem   HPI  [name] is a 33 y.o. male with history of schizophrenia, aggressive behavior, self injury who presents to the emergency department complaining that I do not want to be here in this world.  When asked if he is feeling suicidal, patient will not answer.  He denies HI, hallucinations.  Denies drug or alcohol use.  Poorly cooperative with history and exam.   History provided by patient.    Past Medical History:  Diagnosis Date   B12 deficiency 05/27/2023   Folate deficiency 05/27/2023   Schizo affective schizophrenia (HCC)    Schizophrenia (HCC)     History reviewed. No pertinent surgical history.  MEDICATIONS:  Prior to Admission medications   Medication Sig Start Date End Date Taking? Authorizing Provider  cloZAPine  (CLOZARIL ) 100 MG tablet Take 1 tablet (100 mg total) by mouth at bedtime. 08/01/23   Lynnette Barter, MD  hydrOXYzine  (ATARAX ) 25 MG tablet Take 1 tablet (25 mg total) by mouth every 6 (six) hours as needed for anxiety. 08/01/23   Lynnette Barter, MD  lithium  carbonate (ESKALITH ) 450 MG ER tablet Take 2 tablets (900 mg total) by mouth at bedtime. 08/01/23   Lynnette Barter, MD  senna (SENOKOT) 8.6 MG TABS tablet Take 2 tablets (17.2 mg total) by mouth at bedtime. 06/05/23   Izediuno, Jerrell LABOR, MD  traZODone  (DESYREL ) 50 MG tablet Take 1 tablet (50 mg total) by mouth at bedtime as needed for sleep. 08/01/23   Lynnette Barter, MD    Physical Exam   Triage Vital Signs: ED Triage Vitals  Encounter Vitals Group     BP 08/05/23 0207 (!) 140/80     Girls Systolic BP Percentile --      Girls Diastolic BP Percentile --      Boys Systolic BP Percentile --      Boys Diastolic BP Percentile --      Pulse Rate 08/05/23 0207 74     Resp 08/05/23 0207 18     Temp 08/05/23 0207 98.2 F (36.8 C)      Temp Source 08/05/23 0207 Oral     SpO2 08/05/23 0207 98 %     Weight 08/05/23 0205 169 lb 12.1 oz (77 kg)     Height 08/05/23 0205 5' 11 (1.803 m)     Head Circumference --      Peak Flow --      Pain Score 08/05/23 0207 0     Pain Loc --      Pain Education --      Exclude from Growth Chart --     Most recent vital signs: Vitals:   08/05/23 0207  BP: (!) 140/80  Pulse: 74  Resp: 18  Temp: 98.2 F (36.8 C)  SpO2: 98%    CONSTITUTIONAL: Alert, not cooperative with history or exam HEAD: Normocephalic, atraumatic EYES: Conjunctivae clear, pupils appear equal, sclera nonicteric ENT: normal nose; moist mucous membranes NECK: Supple, normal ROM CARD: RRR; S1 and S2 appreciated RESP: Normal chest excursion without splinting or tachypnea; breath sounds clear and equal bilaterally; no wheezes, no rhonchi, no rales, no hypoxia or respiratory distress, speaking full sentences ABD/GI: Non-distended; soft, non-tender, no rebound, no guarding, no peritoneal signs BACK: The back appears normal EXT: Normal ROM in all joints; no deformity  noted, no edema SKIN: Normal color for age and race; warm; no rash on exposed skin NEURO: Moves all extremities equally, normal speech PSYCH: States I do not want to be here in this world.  Denies HI.  Not responding to internal stimuli.   ED Results / Procedures / Treatments   LABS: (all labs ordered are listed, but only abnormal results are displayed) Labs Reviewed  COMPREHENSIVE METABOLIC PANEL WITH GFR - Abnormal; Notable for the following components:      Result Value   BUN 23 (*)    Total Protein 8.4 (*)    AST 48 (*)    ALT 130 (*)    All other components within normal limits  CBC - Abnormal; Notable for the following components:   Hemoglobin 12.8 (*)    HCT 38.8 (*)    All other components within normal limits  LITHIUM  LEVEL - Abnormal; Notable for the following components:   Lithium  Lvl <0.06 (*)    All other components within  normal limits  CBC WITH DIFFERENTIAL/PLATELET - Abnormal; Notable for the following components:   Hemoglobin 12.9 (*)    HCT 38.4 (*)    Monocytes Absolute 1.1 (*)    All other components within normal limits  ETHANOL  URINE DRUG SCREEN, QUALITATIVE ([facility] ONLY)  ACETAMINOPHEN  LEVEL  SALICYLATE LEVEL     EKG:   RADIOLOGY: My personal review and interpretation of imaging:    I have personally reviewed all radiology reports.   No results found.   PROCEDURES:  Critical Care performed: No     Procedures    IMPRESSION / MDM / ASSESSMENT AND PLAN / ED COURSE  I reviewed the triage vital signs and the nursing notes.    Patient here with possible suicidal thoughts.  He tells me I do not want to be here in this world but will not elaborate further.  Has significant psychiatric history.     DIFFERENTIAL DIAGNOSIS (includes but not limited to):   Suicidal ideation, psychosis, decompensated schizophrenia, intoxication, malingering   Patient's presentation is most consistent with acute presentation with potential threat to life or bodily function.   PLAN: Will obtain screening labs, urine.  Will consult psychiatry and TTS.  Will order his home medication.  He is here voluntarily.   MEDICATIONS GIVEN IN ED: Medications  cloZAPine  (CLOZARIL ) tablet 100 mg (has no administration in time range)  hydrOXYzine  (ATARAX ) tablet 25 mg (has no administration in time range)  lithium  carbonate (ESKALITH ) ER tablet 900 mg (has no administration in time range)  senna (SENOKOT) tablet 17.2 mg (has no administration in time range)  traZODone  (DESYREL ) tablet 50 mg (has no administration in time range)     ED COURSE: Patient's labs show normal hemoglobin.  He does have elevated AST, ALT which are new for him.  AST was elevated slightly higher 10 days ago.  He does not have any upper abdominal tenderness on exam.  I do not feel this needs to be worked up further in the emergency  department.  Will have him follow-up with his outpatient doctors for this.  His ethanol level is negative.  Will check a Tylenol  level.  Lithium  level is undetectable.   CONSULTS: Psychiatry and TTS consults pending.   OUTSIDE RECORDS REVIEWED: Reviewed prior psychiatric notes.       FINAL CLINICAL IMPRESSION(S) / ED DIAGNOSES   Final diagnoses:  Suicidal ideation  Elevated liver function tests     Rx / DC Orders  ED Discharge Orders          Ordered    Ambulatory Referral to Primary Care (Establish Care)        08/05/23 0846             Note:  This document was prepared using Dragon voice recognition software and may include unintentional dictation errors.   Eusebia Grulke, Josette SAILOR, DO 08/05/23 [phone]

## 2023-08-05 NOTE — ED Notes (Signed)
 Pt Breakfast provided at bedside

## 2023-08-07 ENCOUNTER — Emergency Department
Admission: EM | Admit: 2023-08-07 | Discharge: 2023-08-07 | Disposition: A | Discharge status: Home / Self Care | Attending: Emergency Medicine | Admitting: Emergency Medicine

## 2023-08-07 ENCOUNTER — Other Ambulatory Visit: Payer: Self-pay

## 2023-08-07 DIAGNOSIS — R252 Cramp and spasm: Secondary | ICD-10-CM | POA: Insufficient documentation

## 2023-08-07 LAB — LITHIUM LEVEL: Lithium Lvl: <0.06 mmol/L — ABNORMAL LOW (ref 0.60–1.20)

## 2023-08-07 NOTE — ED Provider Notes (Signed)
 Lithium  level is not elevated, in fact is low.  Will discharge patient as planned.   Dicky Anes, MD 08/07/23 [phone]

## 2023-08-07 NOTE — ED Notes (Signed)
 Pt given water upon d/c

## 2023-08-07 NOTE — ED Provider Notes (Signed)
 Awaiting lithium  level if normal or low, may be discharged per Dr. Jacolyn Dicky Anes, MD 08/07/23 [phone]

## 2023-08-07 NOTE — ED Notes (Signed)
 Pt states he has felt his hand cramping all day but noticed it  was worse while he was playing basketball.

## 2023-08-07 NOTE — ED Provider Notes (Signed)
 [facility] Provider Note    Event Date/Time   First MD Initiated Contact with Patient 08/07/23 [phone]     (approximate)   History   Hand Pain   HPI  [name] is a 33 y.o. male with a history of schizoaffective disorder who presents with left hand cramping over the last couple of days.  He denies any acute pain.  He states he feels like it is spasming.  He denies any other cramps or spasms in the rest of his body.  He has no history of trauma.  He denies any acute mental health complaints.  I reviewed the past medical records.  The patient was seen by Dr. Donnelly from psychiatry on 7/24 after presenting with passive SI.  He was cleared at that time and did not require inpatient admission.   Physical Exam   Triage Vital Signs: ED Triage Vitals  Encounter Vitals Group     BP 08/07/23 0308 104/74     Girls Systolic BP Percentile --      Girls Diastolic BP Percentile --      Boys Systolic BP Percentile --      Boys Diastolic BP Percentile --      Pulse Rate 08/07/23 0308 76     Resp 08/07/23 0308 18     Temp 08/07/23 0308 98.3 F (36.8 C)     Temp src --      SpO2 08/07/23 0308 100 %     Weight 08/07/23 0309 169 lb (76.7 kg)     Height 08/07/23 0309 5' 11 (1.803 m)     Head Circumference --      Peak Flow --      Pain Score 08/07/23 0308 4     Pain Loc --      Pain Education --      Exclude from Growth Chart --     Most recent vital signs: Vitals:   08/07/23 0308  BP: 104/74  Pulse: 76  Resp: 18  Temp: 98.3 F (36.8 C)  SpO2: 100%     General: Awake, no distress.  CV:  Good peripheral perfusion.  Resp:  Normal effort.  Abd:  No distention.  Other:  Left hand with full range of motion.  2+ radial pulse.  Normal cap refill.  Motor and sensory intact in all median, radial, and ulnar distributions.   ED Results / Procedures / Treatments   Labs (all labs ordered are listed, but only abnormal results are displayed) Labs  Reviewed  LITHIUM  LEVEL     EKG   RADIOLOGY   PROCEDURES:  Critical Care performed: No  Procedures   MEDICATIONS ORDERED IN ED: Medications - No data to display   IMPRESSION / MDM / ASSESSMENT AND PLAN / ED COURSE  I reviewed the triage vital signs and the nursing notes.  33 year old male with PMH as noted above presents with cramps and spasms to his left hand.  Physical exam is unremarkable.  He believes it is possibly due to his mental health medications.  Differential diagnosis includes, but is not limited to, muscle spasm or cramp, medication side effect.  Since the patient is on lithium , we will check a lithium  level as lithium  toxicity could cause cramping or spasms.  Patient's presentation is most consistent with acute, uncomplicated illness.  At this time, the patient is stable for discharge home.  There is no indication for further ED workup or treatment.  ----------------------------------------- 7:17 AM on  08/07/2023 -----------------------------------------  Lithium  level is pending.  If this is in the normal range or negative, the patient will be appropriate for discharge home.  I have signed him out to the oncoming ED physician Dr. Dicky.   FINAL CLINICAL IMPRESSION(S) / ED DIAGNOSES   Final diagnoses:  Cramping of hands     Rx / DC Orders   ED Discharge Orders     None        Note:  This document was prepared using Dragon voice recognition software and may include unintentional dictation errors.    Jacolyn Pae, MD 08/07/23 [phone]

## 2023-08-07 NOTE — ED Triage Notes (Signed)
 Left  hand pain and cramping x 3 days. Says it started while playing basketball.

## 2023-08-08 ENCOUNTER — Encounter: Payer: Self-pay | Admitting: Emergency Medicine

## 2023-08-08 ENCOUNTER — Other Ambulatory Visit (HOSPITAL_COMMUNITY)
Admission: EM | Admit: 2023-08-08 | Discharge: 2023-08-10 | Disposition: A | Discharge status: Home / Self Care | Source: Intra-hospital | Attending: Psychiatry | Admitting: Psychiatry

## 2023-08-08 ENCOUNTER — Emergency Department
Admission: EM | Admit: 2023-08-08 | Discharge: 2023-08-08 | Disposition: A | Discharge status: Other Acute Inpatient Hospital | Attending: Emergency Medicine | Admitting: Emergency Medicine

## 2023-08-08 DIAGNOSIS — R45851 Suicidal ideations: Secondary | ICD-10-CM | POA: Insufficient documentation

## 2023-08-08 DIAGNOSIS — F1210 Cannabis abuse, uncomplicated: Secondary | ICD-10-CM | POA: Diagnosis not present

## 2023-08-08 DIAGNOSIS — F250 Schizoaffective disorder, bipolar type: Secondary | ICD-10-CM | POA: Diagnosis not present

## 2023-08-08 DIAGNOSIS — F209 Schizophrenia, unspecified: Secondary | ICD-10-CM | POA: Diagnosis not present

## 2023-08-08 DIAGNOSIS — F32A Depression, unspecified: Secondary | ICD-10-CM | POA: Diagnosis present

## 2023-08-08 DIAGNOSIS — Z91148 Patient's other noncompliance with medication regimen for other reason: Secondary | ICD-10-CM | POA: Insufficient documentation

## 2023-08-08 DIAGNOSIS — T43596A Underdosing of other antipsychotics and neuroleptics, initial encounter: Secondary | ICD-10-CM | POA: Diagnosis not present

## 2023-08-08 DIAGNOSIS — F29 Unspecified psychosis not due to a substance or known physiological condition: Secondary | ICD-10-CM | POA: Diagnosis not present

## 2023-08-08 DIAGNOSIS — Z79899 Other long term (current) drug therapy: Secondary | ICD-10-CM | POA: Insufficient documentation

## 2023-08-08 DIAGNOSIS — Z9151 Personal history of suicidal behavior: Secondary | ICD-10-CM | POA: Diagnosis not present

## 2023-08-08 DIAGNOSIS — F1410 Cocaine abuse, uncomplicated: Secondary | ICD-10-CM | POA: Diagnosis not present

## 2023-08-08 LAB — COMPREHENSIVE METABOLIC PANEL WITH GFR
ALT: 59 U/L — ABNORMAL HIGH (ref 0–44)
AST: 32 U/L (ref 15–41)
Albumin: 3.7 g/dL (ref 3.5–5.0)
Alkaline Phosphatase: 75 U/L (ref 38–126)
Anion gap: 8 (ref 5–15)
BUN: 16 mg/dL (ref 6–20)
CO2: 23 mmol/L (ref 22–32)
Calcium: 8.8 mg/dL — ABNORMAL LOW (ref 8.9–10.3)
Chloride: 109 mmol/L (ref 98–111)
Creatinine, Ser: 0.9 mg/dL (ref 0.61–1.24)
GFR, Estimated: >60 mL/min (ref >60)
Glucose, Bld: 129 mg/dL — ABNORMAL HIGH (ref 70–99)
Potassium: 3 mmol/L — ABNORMAL LOW (ref 3.5–5.1)
Sodium: 140 mmol/L (ref 135–145)
Total Bilirubin: 0.3 mg/dL (ref 0.0–1.2)
Total Protein: 7.3 g/dL (ref 6.5–8.1)

## 2023-08-08 LAB — CBC
HCT: 35.5 % — ABNORMAL LOW (ref 39.0–52.0)
Hemoglobin: 12 g/dL — ABNORMAL LOW (ref 13.0–17.0)
MCH: 30 pg (ref 26.0–34.0)
MCHC: 33.8 g/dL (ref 30.0–36.0)
MCV: 88.8 fL (ref 80.0–100.0)
Platelets: 260 K/uL (ref 150–400)
RBC: 4 MIL/uL — ABNORMAL LOW (ref 4.22–5.81)
RDW: 13.2 % (ref 11.5–15.5)
WBC: 7.6 K/uL (ref 4.0–10.5)
nRBC: 0 % (ref 0.0–0.2)

## 2023-08-08 LAB — URINE DRUG SCREEN, QUALITATIVE (ARMC ONLY)
Amphetamines, Ur Screen: NOT DETECTED
Barbiturates, Ur Screen: NOT DETECTED
Benzodiazepine, Ur Scrn: NOT DETECTED
Cannabinoid 50 Ng, Ur ~~LOC~~: POSITIVE — AB
Cocaine Metabolite,Ur ~~LOC~~: POSITIVE — AB
MDMA (Ecstasy)Ur Screen: NOT DETECTED
Methadone Scn, Ur: NOT DETECTED
Opiate, Ur Screen: NOT DETECTED
Phencyclidine (PCP) Ur S: NOT DETECTED
Tricyclic, Ur Screen: NOT DETECTED

## 2023-08-08 LAB — ETHANOL: Alcohol, Ethyl (B): <15 mg/dL (ref <15)

## 2023-08-08 MED ORDER — LORAZEPAM 2 MG/ML IJ SOLN: 2.0000 mg | Freq: Three times a day (TID) | INTRAMUSCULAR | Status: DC | PRN

## 2023-08-08 MED ORDER — HALOPERIDOL LACTATE 5 MG/ML IJ SOLN: 5.0000 mg | Freq: Three times a day (TID) | INTRAMUSCULAR | Status: DC | PRN

## 2023-08-08 MED ORDER — POTASSIUM CHLORIDE CRYS ER 20 MEQ PO TBCR
40.0000 meq | EXTENDED_RELEASE_TABLET | Freq: Once | ORAL | Status: AC
  Administered 2023-08-08: 40 meq via ORAL
  Filled 2023-08-08: qty 2

## 2023-08-08 MED ORDER — OLANZAPINE 5 MG PO TABS: 5.0000 mg | ORAL_TABLET | Freq: Every day | ORAL | Status: DC

## 2023-08-08 MED ORDER — DIPHENHYDRAMINE HCL 50 MG PO CAPS: 50.0000 mg | ORAL_CAPSULE | Freq: Three times a day (TID) | ORAL | Status: DC | PRN

## 2023-08-08 MED ORDER — ACETAMINOPHEN 325 MG PO TABS: 650.0000 mg | ORAL_TABLET | Freq: Four times a day (QID) | ORAL | Status: DC | PRN

## 2023-08-08 MED ORDER — MAGNESIUM HYDROXIDE 400 MG/5ML PO SUSP: 30.0000 mL | Freq: Every day | ORAL | Status: DC | PRN

## 2023-08-08 MED ORDER — HALOPERIDOL LACTATE 5 MG/ML IJ SOLN: 10.0000 mg | Freq: Three times a day (TID) | INTRAMUSCULAR | Status: DC | PRN

## 2023-08-08 MED ORDER — DIPHENHYDRAMINE HCL 50 MG/ML IJ SOLN: 50.0000 mg | Freq: Three times a day (TID) | INTRAMUSCULAR | Status: DC | PRN

## 2023-08-08 MED ORDER — ALUM & MAG HYDROXIDE-SIMETH 200-200-20 MG/5ML PO SUSP: 30.0000 mL | ORAL | Status: DC | PRN

## 2023-08-08 MED ORDER — ESCITALOPRAM OXALATE 5 MG PO TABS
5.0000 mg | ORAL_TABLET | Freq: Every day | ORAL | Status: DC
  Administered 2023-08-09: 5 mg via ORAL
  Filled 2023-08-08: qty 1

## 2023-08-08 MED ORDER — TRAZODONE HCL 50 MG PO TABS
50.0000 mg | ORAL_TABLET | Freq: Every evening | ORAL | Status: DC | PRN
  Administered 2023-08-08 – 2023-08-09 (×2): 50 mg via ORAL
  Filled 2023-08-08 (×2): qty 1

## 2023-08-08 MED ORDER — HALOPERIDOL 5 MG PO TABS: 5.0000 mg | ORAL_TABLET | Freq: Three times a day (TID) | ORAL | Status: DC | PRN

## 2023-08-08 MED ORDER — ESCITALOPRAM OXALATE 10 MG PO TABS
5.0000 mg | ORAL_TABLET | Freq: Every day | ORAL | Status: DC
  Administered 2023-08-08: 5 mg via ORAL
  Filled 2023-08-08: qty 1

## 2023-08-08 MED ORDER — HYDROXYZINE HCL 25 MG PO TABS: 25.0000 mg | ORAL_TABLET | Freq: Three times a day (TID) | ORAL | Status: DC | PRN

## 2023-08-08 MED ORDER — OLANZAPINE 5 MG PO TABS
5.0000 mg | ORAL_TABLET | Freq: Every day | ORAL | Status: DC
  Administered 2023-08-08: 5 mg via ORAL
  Filled 2023-08-08: qty 1

## 2023-08-08 NOTE — ED Notes (Signed)
 2 belongings bags given to safe transport driver.

## 2023-08-08 NOTE — Progress Notes (Signed)
 [facility] Psychiatric Consult Initial  Patient Name: .[name]  MRN: [id number]  DOB: 10/08/1990  Consult Order details:  Orders (From admission, onward)     Start     Ordered   08/08/23 0418  CONSULT TO CALL ACT TEAM       Ordering Provider: Cyrena Mylar, MD  Provider:  (Not yet assigned)  Question:  Reason for Consult?  Answer:  Psych consult   08/08/23 0417   08/08/23 0417  IP CONSULT TO PSYCHIATRY       Ordering Provider: Cyrena Mylar, MD  Provider:  (Not yet assigned)  Question Answer Comment  Place call to: ED   Reason for Consult Admit      08/08/23 0417             Mode of Visit: In person    Psychiatry Consult Evaluation  Service Date: August 08, 2023 LOS:  LOS: 0 days  Chief Complaint suicidal ideation   Primary Psychiatric Diagnoses  schizophrenia   Assessment  [name] is a 33 y.o. male admitted: Presented to the ED   EDP note:  [name] is a 33 y.o. male   Past medical history of schizophrenia, substance use who presents to the emergency department with passive suicidal ideation.  He states that he is feeling depressed and hopeless. He denies substance use recently and denies alcohol use.  He denies any self-harm or ingestions. He has passive suicidal ideation but he states that he is scared that he may become suicidal as he has had suicide attempt in the past. He denies HI or AVH. He has no other acute medical complaints.  Subjective:  Patient is a 33 year old black male who presented to the ED last evening with complaints of passive suicidal thoughts. He explains that he has schizophrenia and has not been compliant with his medications. He is unsure of the last time that he took his medications or the names. Review of outside charts, he was admitted to [facility] 02/06/23 and was started on several medications. Patient reports that this world is crazy and their are souls and aliens trying to attack him. He is very somnolent and  requires frequent redirection and recall to the assessment. Mr. [name] reports that this occurs when he is very depressed. He has been homeless for the past 1-2 weeks. Previously was living with family and he states that he chose to leave that residence. He believes that he completed an application for [facility] and was approved, he has not started services. Endorses ongoing suicidal thoughts I am tired of being in this world. There was a suicide attempt in [place] in 2015 of which he jumped off a bridge. He sustained a fractured back and leg from that suicide attempt. He does not provide family information or consent to obtain collateral information.    Diagnoses:  Active Hospital problems: Active Problems:   * No active hospital problems. *    Plan   ## Psychiatric Medication Recommendations:  Was treated with lithium  600 mg BID and olanzapine  10 mg nightly according to Care Everywhere notes. Given he is homeless would not recommend lithium  at this time. Will restart olanzapine  and monitor effectiveness.   ## Medical Decision Making Capacity: unable to fully determine capacity at this time  ## Further Work-up:   -- most recent EKG on 07/26/2023 had QtC of 403 -- Pertinent labwork reviewed earlier this admission includes: UDS positive for cocaine and cannabinoid, K+ 3, Calcium  8.8, ALT 59   ## Disposition:-- We recommend inpatient psychiatric hospitalization when medically cleared. Patient is under voluntary admission status at this time; please IVC if attempts to leave hospital.  ## Behavioral / Environmental: -Utilize compassion and acknowledge the patient's experiences while setting clear and realistic expectations for care.    ## Safety and Observation Level:  - Based on my clinical evaluation, I estimate the patient to be at moderate risk of self harm in the current setting. - At this time, we recommend  routine. This decision is based on my review of the chart including patient's  history and current presentation, interview of the patient, mental status examination, and consideration of suicide risk including evaluating suicidal ideation, plan, intent, suicidal or self-harm behaviors, risk factors, and protective factors. This judgment is based on our ability to directly address suicide risk, implement suicide prevention strategies, and develop a safety plan while the patient is in the clinical setting. Please contact our team if there is a concern that risk level has changed.  CSSR Risk Category:C-SSRS RISK CATEGORY: No Risk  Suicide Risk Assessment: Patient has following modifiable risk factors for suicide: active suicidal ideation, untreated depression, CAH with suicidal content, medication noncompliance, lack of access to outpatient mental health resources, and active mental illness (to encompass adhd, tbi, mania, psychosis, trauma reaction), which we are addressing by optimizing medications and psychiatric hospitalization . Patient has following non-modifiable or demographic risk factors for suicide: history of suicide attempt and psychiatric hospitalization Patient has the following protective factors against suicide: Access to outpatient mental health care  Thank you for this consult request. Recommendations have been communicated to the primary team.  We will recommend inpatient hospitalization at this time.   Greg Centola B Giles Currie, NP       History of Present Illness  Relevant Aspects of Hospital ED   Patient Report:  Patient is a 33 year old black male who presented to the ED last evening with complaints of passive suicidal thoughts. He explains that he has schizophrenia and has not been compliant with his medications. He is unsure of the last time that he took his medications or the names. Review of outside charts, he was admitted to [facility] 02/06/23 and was started on several medications. Patient reports that this world is crazy and their are souls and aliens trying  to attack him. He is very somnolent and requires frequent redirection and recall to the assessment. Mr. Tengan reports that this occurs when he is very depressed. He has been homeless for the past 1-2 weeks. Previously was living with family and he states that he chose to leave that residence. He believes that he completed an application for [facility] and was approved, he has not started services. Endorses ongoing suicidal thoughts I am tired of being in this world. There was a suicide attempt in [place] in 2015 of which he jumped off a bridge. He sustained a fractured back and leg from that suicide attempt. He does not provide family information or consent to obtain collateral information.  Psych ROS:  Depression: yes Anxiety:  yes Mania (lifetime and current): denies Psychosis: (lifetime and current): yes  Collateral information:  None available    Psychiatric and Social History  Psychiatric History:  Information collected from patient and chart  Prev Dx/Sx: schizophrenia Current Psych Provider: unknown Home Meds (current): unknown Previous Med Trials: unknown Therapy: unknown  Prior Psych Hospitalization: yes  Prior Self Harm: yes Prior Violence: unknown  Family Psych History: unknown  Family Hx suicide: unknown  Social History:   Educational Hx: unknown Occupational Hx: unknown Legal Hx: unknown Living Situation: unknown Spiritual Hx: unknown Access to weapons/lethal means: denies   Substance History Alcohol: unknown  Type of alcohol unknown Last Drink unknown Number of drinks per day unknown History of alcohol withdrawal seizures unknown History of DT's unknown Tobacco: unknown Illicit drugs: cocaine Prescription drug abuse: unknown Rehab hx: unknown  Exam Findings  Physical Exam: I agree with initial provider physical exam Vital Signs:  Temp:  [97.9 F (36.6 C)] 97.9 F (36.6 C) (07/27 0232) Pulse Rate:  [73] 73 (07/27 0232) Resp:  [15] 15 (07/27 0232) BP:  (114)/(78) 114/78 (07/27 0232) SpO2:  [100 %] 100 % (07/27 0232) Weight:  [76.7 kg] 76.7 kg (07/27 0229) Blood pressure 114/78, pulse 73, temperature 97.9 F (36.6 C), temperature source Oral, resp. rate 15, height 5' 11 (1.803 m), weight 76.7 kg, SpO2 100%. Body mass index is 23.57 kg/m.    Mental Status Exam: General Appearance: Disheveled  Orientation:  Full (Time, Place, and Person)  Memory:  Immediate;   Fair Recent;   Fair Remote;   Fair  Concentration:  Concentration: Fair and Attention Span: Fair  Recall:  Fair  Attention  Fair  Eye Contact:  None  Speech:  Garbled  Language:  Fair  Volume:  Decreased  Mood: depressed  Affect:  Depressed  Thought Process:  Disorganized  Thought Content:  Tangential  Suicidal Thoughts:  Yes.  without intent/plan  Homicidal Thoughts:  No  Judgement:  Fair  Insight:  Fair  Psychomotor Activity:  Normal  Akathisia:  No  Fund of Knowledge:  Fair      Assets:  Desire for Improvement  Cognition:  WNL  ADL's:  Intact  AIMS (if indicated):        Other History   These have been pulled in through the EMR, reviewed, and updated if appropriate.  Family History:  The patient's family history is not on file.  Medical History: Past Medical History:  Diagnosis Date   B12 deficiency 05/27/2023   Folate deficiency 05/27/2023   Schizo affective schizophrenia (HCC)    Schizophrenia (HCC)     Surgical History: History reviewed. No pertinent surgical history.   Medications:  No current facility-administered medications for this encounter.  Current Outpatient Medications:    cloZAPine  (CLOZARIL ) 100 MG tablet, Take 1 tablet (100 mg total) by mouth at bedtime. (Patient not taking: Reported on 08/08/2023), Disp: 30 tablet, Rfl: 0   hydrOXYzine  (ATARAX ) 25 MG tablet, Take 1 tablet (25 mg total) by mouth every 6 (six) hours as needed for anxiety. (Patient not taking: Reported on 08/08/2023), Disp: 90 tablet, Rfl: 0   lithium  carbonate  (ESKALITH ) 450 MG ER tablet, Take 2 tablets (900 mg total) by mouth at bedtime. (Patient not taking: Reported on 08/08/2023), Disp: 60 tablet, Rfl: 0   senna (SENOKOT) 8.6 MG TABS tablet, Take 2 tablets (17.2 mg total) by mouth at bedtime. (Patient not taking: Reported on 08/08/2023), Disp: 120 tablet, Rfl: 0   traZODone  (DESYREL ) 50 MG tablet, Take 1 tablet (50 mg total) by mouth at bedtime as needed for sleep. (Patient not taking: Reported on 08/08/2023), Disp: 30 tablet, Rfl: 0  Allergies: Allergies  Allergen Reactions   Ibuprofen Swelling    Tongue swelling   Risperidone And Paliperidone Other (See Comments) and Swelling    gynecomastia gynecomastia    Ziprasidone  Swelling    Tongue swells   Benztropine  Other (See  Comments)    Causes confusion, depression, and delusions   Quetiapine  Other (See Comments)    Depression, suicidality, adverse effect: seizures     Daine KATHEE Ober, NP

## 2023-08-08 NOTE — ED Triage Notes (Addendum)
 Patient walked to ER, stating I don't want to be around nobody. I don't want to die, I don't want to kill myself. Stealing and food don't do nothing. I don't want to talk to anybody. I just don't feel like I'm supposed to be in this world.  Patient homeless. Not taking meds. Denies SI/HI. Only God can help me.

## 2023-08-08 NOTE — ED Notes (Signed)
 EMTALA reviewed by this RN.

## 2023-08-08 NOTE — BH Assessment (Signed)
 Patient has been accepted to [facility]. FACILITY BASED CRISIS.   Patient assigned to room 168 Accepting physician is Dr. Corean Potters .  Call report to [phone].  Representative was Linsey.   ER Staff is aware of it:  Ronnie, ER Secretary  Dr. Clarine, ER MD  Arvella, Patient's Nurse     Address: Facility Based Crisis                [address]                 [place].

## 2023-08-08 NOTE — BHH Counselor (Signed)
 Patient in triage waiting area with blanket over head.  Patient refused to engage in assessment with TTS.  Pt to be assessed later this morning by psychiatry team.

## 2023-08-08 NOTE — ED Notes (Signed)
 Pt refusing vitals at this time as he is wanting to sleep, informed pt we would try again later.

## 2023-08-08 NOTE — ED Notes (Signed)
 Patient dressed out in triage. 2 belongings bags total. Please see belongings documentation for specific info.

## 2023-08-08 NOTE — ED Notes (Signed)
 Pt given dinner tray. Pt calm.

## 2023-08-08 NOTE — ED Notes (Signed)
 Patient admitted into [facility] with SI ideations. According to patient he has the thoughts but no plans. Patient therefore contracted for safety. Cooperated well during the admission process. Oriented to room and unit. Skin assessments WNL. MHT offered patient a meal and something to drink . NAD, environment secured per policy. Will monitor for safety.

## 2023-08-08 NOTE — ED Notes (Signed)
Pt provided with lunch tray and beverage  

## 2023-08-08 NOTE — ED Provider Notes (Signed)
 [facility] Provider Note    None    (approximate)   History   Psychiatric Evaluation   HPI  [name] is a 33 y.o. male   Past medical history of schizophrenia, substance use who presents to the emergency department with passive suicidal ideation.  He states that he is feeling depressed and hopeless.  He denies substance use recently and denies alcohol use.  He denies any self-harm or ingestions.  He has passive suicidal ideation but he states that he is scared that he may become suicidal as he has had suicide attempt in the past.  He denies HI or AVH.  He has no other acute medical complaints.  External Medical Documents Reviewed: Previous hospital and psychiatric notes      Physical Exam   Triage Vital Signs: ED Triage Vitals  Encounter Vitals Group     BP 08/08/23 0232 114/78     Girls Systolic BP Percentile --      Girls Diastolic BP Percentile --      Boys Systolic BP Percentile --      Boys Diastolic BP Percentile --      Pulse Rate 08/08/23 0232 73     Resp 08/08/23 0232 15     Temp 08/08/23 0232 97.9 F (36.6 C)     Temp Source 08/08/23 0232 Oral     SpO2 08/08/23 0232 100 %     Weight 08/08/23 0229 169 lb (76.7 kg)     Height 08/08/23 0229 5' 11 (1.803 m)     Head Circumference --      Peak Flow --      Pain Score 08/08/23 0229 0     Pain Loc --      Pain Education --      Exclude from Growth Chart --     Most recent vital signs: Vitals:   08/08/23 0232  BP: 114/78  Pulse: 73  Resp: 15  Temp: 97.9 F (36.6 C)  SpO2: 100%    General: Awake, no distress.  CV:  Good peripheral perfusion.  Resp:  Normal effort.  Abd:  No distention.  Other:  Patient resting comfortably and cooperative at this time.  No signs of trauma and has normal vital signs.  He is moving all extremities.   ED Results / Procedures / Treatments   Labs (all labs ordered are listed, but only abnormal results are displayed) Labs  Reviewed  COMPREHENSIVE METABOLIC PANEL WITH GFR - Abnormal; Notable for the following components:      Result Value   Potassium 3.0 (*)    Glucose, Bld 129 (*)    Calcium 8.8 (*)    ALT 59 (*)    All other components within normal limits  CBC - Abnormal; Notable for the following components:   RBC 4.00 (*)    Hemoglobin 12.0 (*)    HCT 35.5 (*)    All other components within normal limits  ETHANOL  URINE DRUG SCREEN, QUALITATIVE ([facility] ONLY)     I ordered and reviewed the above labs they are notable for slightly low potassium.   PROCEDURES:  Critical Care performed: No  Procedures   MEDICATIONS ORDERED IN ED: Medications  potassium chloride  SA (KLOR-CON  M) CR tablet 40 mEq (has no administration in time range)     IMPRESSION / MDM / ASSESSMENT AND PLAN / ED COURSE  I reviewed the triage vital signs and the nursing notes.  Patient's presentation is most consistent with acute presentation with potential threat to life or bodily function.  Differential diagnosis includes, but is not limited to, suicidal ideation, depression, substance-induced mood disorder, malingering    MDM:    Patient here with depression and suicidal ideation, passive requesting a psychiatric evaluation.  Was similar to his presentation several days ago but with a history of suicide attempt reported by patient and worsening depression I think it prudent to have him see psychiatry again today voluntarily.  He does not appear to be acutely psychotic or actively suicidal so does not require IVC.  He is willing to stay to meet with psychiatry.  Medically clear.      FINAL CLINICAL IMPRESSION(S) / ED DIAGNOSES   Final diagnoses:  Suicidal ideation     Rx / DC Orders   ED Discharge Orders     None        Note:  This document was prepared using Dragon voice recognition software and may include unintentional dictation errors.    Cyrena Mylar, MD 08/08/23  [phone]

## 2023-08-08 NOTE — ED Notes (Signed)
Pt given morning snack

## 2023-08-09 DIAGNOSIS — F250 Schizoaffective disorder, bipolar type: Secondary | ICD-10-CM | POA: Diagnosis not present

## 2023-08-09 DIAGNOSIS — F1210 Cannabis abuse, uncomplicated: Secondary | ICD-10-CM | POA: Diagnosis not present

## 2023-08-09 DIAGNOSIS — F1410 Cocaine abuse, uncomplicated: Secondary | ICD-10-CM

## 2023-08-09 MED ORDER — POTASSIUM CHLORIDE CRYS ER 20 MEQ PO TBCR
40.0000 meq | EXTENDED_RELEASE_TABLET | Freq: Once | ORAL | Status: AC
  Administered 2023-08-09: 40 meq via ORAL
  Filled 2023-08-09: qty 2

## 2023-08-09 MED ORDER — POLYETHYLENE GLYCOL 3350 17 G PO PACK
17.0000 g | PACK | Freq: Every day | ORAL | Status: DC
  Administered 2023-08-09: 17 g via ORAL
  Filled 2023-08-09 (×2): qty 1

## 2023-08-09 MED ORDER — DOCUSATE SODIUM 100 MG PO CAPS
100.0000 mg | ORAL_CAPSULE | Freq: Every day | ORAL | Status: DC
  Administered 2023-08-09: 100 mg via ORAL
  Filled 2023-08-09: qty 1

## 2023-08-09 MED ORDER — CLOZAPINE 25 MG PO TABS
25.0000 mg | ORAL_TABLET | Freq: Every day | ORAL | Status: DC
  Administered 2023-08-09: 25 mg via ORAL
  Filled 2023-08-09: qty 1

## 2023-08-09 MED ORDER — LITHIUM CARBONATE ER 300 MG PO TBCR
600.0000 mg | EXTENDED_RELEASE_TABLET | Freq: Every day | ORAL | Status: DC
  Administered 2023-08-09: 600 mg via ORAL
  Filled 2023-08-09: qty 2

## 2023-08-09 NOTE — ED Notes (Signed)
 Patient asleep in the bedroom.NAD. Will continue to monitor for safety

## 2023-08-09 NOTE — ED Notes (Signed)
 Pt denied si hi and avh. Pt says he is feeling 'okay', displays a flat depressed affect. Pt appears drowsy, currently attending morning AA group, however, is asleep in chair. Pt denies physical pain and discomforts. Pt affirms to have eaten breakfast.

## 2023-08-09 NOTE — Discharge Instructions (Addendum)
 Please take all medications as prescribed (lithium  600mg  at bedtime and clozapine  100mg  at bedtime).   *Your follow up appointment for psychiatry and ACT services is scheduled for Friday, August 1 @ 11:00 at [facility] and will need to arrive 15 minutes early and to bring your photo ID and insurance card.   [facility] [address] [place], [place] [address] Phone: [phone] Fax: [phone] [email or web address]  For walk in's:  How To Access Our Services Because our main goal is to meet the needs of our consumers, [facility] operates on a walk-in basis! To access our services, there are just 3 easy steps:  1) Walk in any Monday, Wednesday or Friday between 8:00 am and 3:00 pm and complete our consumer paperwork 2) A Comprehensive Clinical Assessment (CCA) will be completed and appropriate service recommendations will be provided 3) Recommendations are sent to [facility] team members and the appropriate staff will call you within days. If you have any questions or concerns at any time, please call us :   [facility] [address] [place], [place] [address] Phone: [phone]

## 2023-08-09 NOTE — Group Note (Signed)
 Group Topic: Recovery Basics  Group Date: 08/09/2023 Start Time: 2000 End Time: 2030 Facilitators: Joan Plowman B  Department: [facility]  Number of Participants: 5  Group Focus: abuse issues, chemical dependency issues, concentration, coping skills, daily focus, goals/reality orientation, personal responsibility, relapse prevention, and social skills Treatment Modality:  Individual Therapy Interventions utilized were leisure development, patient education, story telling, and support Purpose: enhance coping skills, express feelings, express irrational fears, increase insight, relapse prevention strategies, and trigger / craving management  Name: [name] Date of Birth: 02/12/1990  MR: [id number]    Level of Participation: PT DID NOT ATTEND GROUPS Quality of Participation: cooperative Interactions with others: gave feedback Mood/Affect: appropriate Triggers (if applicable): NA Cognition: coherent/clear Progress: None Response: NA Plan: patient will be encouraged to go to groups.   Patients Problems:  Patient Active Problem List   Diagnosis Date Noted   Schizophrenia, acute (HCC) 07/27/2023   Schizoaffective disorder, bipolar type (HCC) 05/25/2023   Schizophrenia (HCC) 05/25/2023   Cocaine use 05/11/2023   Aggressive behavior 05/11/2023   Cocaine abuse (HCC) 05/18/2022   Schizoaffective disorder (HCC) 06/27/2019   Schizophrenia, paranoid (HCC) 03/12/2019   Cocaine abuse with intoxication (HCC)    Self-inflicted laceration of right wrist (HCC) 11/30/2018   Suicidal ideation 11/29/2018   Cocaine abuse with cocaine-induced mood disorder (HCC) 11/06/2018   Agitation    Cannabis abuse 11/10/2017

## 2023-08-09 NOTE — Discharge Planning (Signed)
 LCSW met with patient to assess current mood, affect, physical state, and inquire about needs/goals while here in [facility] and after discharge. Patient reports he presented due to being involuntarily committed and per EMR was with suicidal ideations.   Patient reported using cocaine (snorting it) over the past year but would not disclose the amount or frequency. Patient stated he had been to [facility] mental health services in [place] and completed his initial assessment and would like to follow up there again. Patient denied taking any medications as present.    Patient reports he has been living with his mother and sister and planning to return home with them. Patient wanted to leave today and voiced understanding that provider would have to assess his safety and stability before discharging him due to his recent SI.   Patient reports his current goal is to follow up with outpatient resources for substance use and medication management. Patient denies any prior history of outpatient or inpatient substance abuse treatment. Patient currently denies any SI/HI/AVH.  Patient expressed understanding and appreciation of LCSW assistance. No other needs were reported at this time by patient.

## 2023-08-09 NOTE — Group Note (Signed)
 Group Topic: Understanding Self  Group Date: 08/09/2023 Start Time: 1800 End Time: 8164 Facilitators: Daved Tinnie HERO, RN  Department: [facility]  Number of Participants: 6  Group Focus: nursing group Treatment Modality:  Psychoeducation Interventions utilized were exploration, group exercise, and story telling Purpose: increase insight  Name: [name] Date of Birth: Apr 11, 1990  MR: [id number]    Level of Participation: pt did no attend group  Plan: patient will be encouraged to attend future RN education groups  Patients Problems:  Patient Active Problem List   Diagnosis Date Noted   Schizophrenia, acute (HCC) 07/27/2023   Schizoaffective disorder, bipolar type (HCC) 05/25/2023   Schizophrenia (HCC) 05/25/2023   Cocaine use 05/11/2023   Aggressive behavior 05/11/2023   Cocaine abuse (HCC) 05/18/2022   Schizoaffective disorder (HCC) 06/27/2019   Schizophrenia, paranoid (HCC) 03/12/2019   Cocaine abuse with intoxication (HCC)    Self-inflicted laceration of right wrist (HCC) 11/30/2018   Suicidal ideation 11/29/2018   Cocaine abuse with cocaine-induced mood disorder (HCC) 11/06/2018   Agitation    Cannabis abuse 11/10/2017

## 2023-08-09 NOTE — Discharge Planning (Signed)
 SW made call to [facility] to schedule his follow up assessment and referral for psychiatry and ACT team on August 1 @ 11:00. Instructions will be provided in patients AVS upon discharge.

## 2023-08-09 NOTE — ED Notes (Signed)
 Pt says he is doing 'okay' and that he ate lunch. Pt jsut entered bedroom from using the bathroom. Pt denies any distress.

## 2023-08-09 NOTE — ED Provider Notes (Addendum)
 Facility Based Crisis Admission H&P  Date: 08/09/23 Patient Name: [name] MRN: [id number] Chief Complaint:  I just want to go home  Diagnoses:  Final diagnoses:  Schizoaffective disorder, bipolar type (HCC)  Cocaine abuse (HCC)  Cannabis abuse    HPI:  33 year old African-American male with history of schizophrenia paranoid type versus schizoaffective disorder. Patient was discharged from [facility] 7 days ago and did not fill his medications, previous admission to [facility] in May as well. Clozapine  and lithium  was restarted the last 2 admission due to failures with Abilify  and RIsperdal. Patient presenting to ED with SI yesterday.  On evaluation patient is internally preoccupied with thought blocking but denies SI, HI or AVH today. Patient is vague historian and states that he came into the hospital because of a situation. He cannot name his medications or what doses he is taking stating his mother gives him his medications.   Patient gave me his mother's contact and gave me permission to speak with her. She reports he has had significant psychosis in the past. States he was just discharged from [facility] on 7/20 and did not fill his medications at outpatient pharmacy. Discussed on chart review they noted a lack of any response to Abilify  and Risperdal and she confirms he has adverse reactions to Risperdal and Geodon . States patient has been doing better recently than his usual psychosis. States Clozapine  seems to work when he is taking it along with lithium . Patient denies recent substance abuse however UDS + for cocaine and cannabis.  Associated Signs/Symptoms:   Depression Symptoms:  fatigue, difficulty concentrating, suicidal thoughts without plan,   (Hypo) Manic Symptoms:  Labiality of Mood,   Anxiety Symptoms:  Excessive Worry,   Psychotic Symptoms:  Patient currently denies any SIHI, AVH, delusional thoughts or paranoia.   PTSD Symptoms:  NA  PHQ 2-9:   Flowsheet  Row ED from 08/08/2023 in [facility] Emergency Department at [facility] ED from 08/07/2023 in [facility] Emergency Department at [facility] ED from 08/05/2023 in [facility] Emergency Department at [facility]  C-SSRS RISK CATEGORY No Risk No Risk Error: Q3, 4, or 5 should not be populated when Q2 is No      Total Time spent with patient: 45 minutes  Musculoskeletal  Strength & Muscle Tone: within normal limits Gait & Station: normal Patient leans: N/A  Psychiatric Specialty Exam  Presentation General Appearance:  Appropriate for Environment  Eye Contact: Poor  Speech: Garbled  Speech Volume: Decreased  Handedness: Right   Mood and Affect  Mood: Dysphoric  Affect: Depressed   Thought Process  Thought Processes: Disorganized  Descriptions of Associations:Loose  Orientation:Full (Time, Place and Person)  Thought Content:Delusions  Diagnosis of Schizophrenia or Schizoaffective disorder in past: Yes  Duration of Psychotic Symptoms: Greater than six months  Hallucinations:Hallucinations: Auditory  Ideas of Reference:Delusions  Suicidal Thoughts:patient denies SI today however is guarded and evasive  Homicidal Thoughts:Homicidal Thoughts: No   Sensorium  Memory: Immediate Fair; Recent Fair; Remote Fair  Judgment: Fair  Insight: Fair   Art therapist  Concentration: Poor  Attention Span: Poor  Recall: [employer] of Knowledge: Fair  Language: Fair   Psychomotor Activity  Psychomotor Activity: Psychomotor Activity: Normal   Assets  Assets: Physical Health   Sleep  Sleep: Sleep: Good   No data recorded  Physical exam:  General: Well developed, well nourished.  Pupils: Normal at 3mm Respiratory: Breathing is unlabored.  Cardiovascular: No edema.  Language: No anomia,  no aphasia Muscle strength and tone-pt moving all extremities.  Gait not assessed as pt remained in bed.  Neuro: Facial muscles  are symmetric. Pt without tremor, no evidence of hyperarousal.  Review of Systems  Constitutional: Negative.   HENT: Negative.    Eyes: Negative.   Respiratory: Negative.    Cardiovascular: Negative.   Gastrointestinal: Negative.   Genitourinary: Negative.   Musculoskeletal: Negative.   Skin: Negative.   Neurological: Negative.   Endo/Heme/Allergies: Negative.   Psychiatric/Behavioral:  Positive for depression, hallucinations and suicidal ideas.     Blood pressure 111/67, pulse 69, temperature 97.8 F (36.6 C), temperature source Oral, resp. rate 18, SpO2 98%. There is no height or weight on file to calculate BMI.  Past Psychiatric History:  Diagnoses: history of schizoaffective or schizophrenia disorder and a history of cocaine abuse.  [facility] [place].  He was referred for outpatient follow-up at [facility] last time and was on appropriate medication.  He cannot remember the medicines and it is unclear how much he ever followed up and this is similar pattern this admission to 2 prior this year at [facility].  Clear history of cocaine abuse which has complicated any assessment of his other mental health problems.    Patient has prior suicide attempts althought will not elaborate  Is the patient at risk to self? Yes  Has the patient been a risk to self in the past 6 months? Yes .    Has the patient been a risk to self within the distant past? Yes   Is the patient a risk to others? No   Has the patient been a risk to others in the past 6 months? No   Has the patient been a risk to others within the distant past? No   Past Medical History:  Past Medical History:  Diagnosis Date   B12 deficiency 05/27/2023   Folate deficiency 05/27/2023   Schizo affective schizophrenia (HCC)    Schizophrenia (HCC)     Family History: patient denies family history Social History: lives with mother  Last Labs:  Admission on 08/08/2023, Discharged on  08/08/2023  Component Date Value Ref Range Status   Sodium 08/08/2023 140  135 - 145 mmol/L Final   Potassium 08/08/2023 3.0 (L)  3.5 - 5.1 mmol/L Final   Chloride 08/08/2023 109  98 - 111 mmol/L Final   CO2 08/08/2023 23  22 - 32 mmol/L Final   Glucose, Bld 08/08/2023 129 (H)  70 - 99 mg/dL Final   Glucose reference range applies only to samples taken after fasting for at least 8 hours.   BUN 08/08/2023 16  6 - 20 mg/dL Final   Creatinine, Ser 08/08/2023 0.90  0.61 - 1.24 mg/dL Final   Calcium 92/72/7974 8.8 (L)  8.9 - 10.3 mg/dL Final   Total Protein 92/72/7974 7.3  6.5 - 8.1 g/dL Final   Albumin 92/72/7974 3.7  3.5 - 5.0 g/dL Final   AST 92/72/7974 32  15 - 41 U/L Final   ALT 08/08/2023 59 (H)  0 - 44 U/L Final   Alkaline Phosphatase 08/08/2023 75  38 - 126 U/L Final   Total Bilirubin 08/08/2023 0.3  0.0 - 1.2 mg/dL Final   GFR, Estimated 08/08/2023 >60  >60 mL/min Final   Comment: (NOTE) Calculated using the CKD-EPI Creatinine Equation (2021)    Anion gap 08/08/2023 8  5 - 15 Final   Performed at [facility], [address] [place], [place] [address]   Alcohol, Ethyl (B) 08/08/2023 <15  <15 mg/dL Final   Comment: (NOTE) For medical purposes only. Performed at [facility], [address] [place], [place] [address]    WBC 08/08/2023 7.6  4.0 - 10.5 K/uL Final   RBC 08/08/2023 4.00 (L)  4.22 - 5.81 MIL/uL Final   Hemoglobin 08/08/2023 12.0 (L)  13.0 - 17.0 g/dL Final   HCT 92/72/7974 35.5 (L)  39.0 - 52.0 % Final   MCV 08/08/2023 88.8  80.0 - 100.0 fL Final   MCH 08/08/2023 30.0  26.0 - 34.0 pg Final   MCHC 08/08/2023 33.8  30.0 - 36.0 g/dL Final   RDW 92/72/7974 13.2  11.5 - 15.5 % Final   Platelets 08/08/2023 260  150 - 400 K/uL Final   nRBC 08/08/2023 0.0  0.0 - 0.2 % Final   Performed at [facility], [address] [place], [place] [address]   Tricyclic, Ur Screen 08/08/2023 NONE DETECTED  NONE DETECTED Final   Amphetamines, Ur Screen  08/08/2023 NONE DETECTED  NONE DETECTED Final   MDMA (Ecstasy)Ur Screen 08/08/2023 NONE DETECTED  NONE DETECTED Final   Cocaine Metabolite,Ur [place] 08/08/2023 POSITIVE (A)  NONE DETECTED Final   Opiate, Ur Screen 08/08/2023 NONE DETECTED  NONE DETECTED Final   Phencyclidine (PCP) Ur S 08/08/2023 NONE DETECTED  NONE DETECTED Final   Cannabinoid 50 Ng, Ur [place] 08/08/2023 POSITIVE (A)  NONE DETECTED Final   Barbiturates, Ur Screen 08/08/2023 NONE DETECTED  NONE DETECTED Final   Benzodiazepine, Ur Scrn 08/08/2023 NONE DETECTED  NONE DETECTED Final   Methadone Scn, Ur 08/08/2023 NONE DETECTED  NONE DETECTED Final   Comment: (NOTE) Tricyclics + metabolites, urine    Cutoff 1000 ng/mL Amphetamines + metabolites, urine  Cutoff 1000 ng/mL MDMA (Ecstasy), urine              Cutoff 500 ng/mL Cocaine Metabolite, urine          Cutoff 300 ng/mL Opiate + metabolites, urine        Cutoff 300 ng/mL Phencyclidine (PCP), urine         Cutoff 25 ng/mL Cannabinoid, urine                 Cutoff 50 ng/mL Barbiturates + metabolites, urine  Cutoff 200 ng/mL Benzodiazepine, urine              Cutoff 200 ng/mL Methadone, urine                   Cutoff 300 ng/mL  The urine drug screen provides only a preliminary, unconfirmed analytical test result and should not be used for non-medical purposes. Clinical consideration and professional judgment should be applied to any positive drug screen result due to possible interfering substances. A more specific alternate chemical method must be used in order to obtain a confirmed analytical result. Gas chromatography / mass spectrometry (GC/MS) is the preferred confirm                          atory method. Performed at [facility], [address], [place], [place] [address]   Admission on 08/07/2023, Discharged on 08/07/2023  Component Date Value Ref Range Status   Lithium  Lvl 08/07/2023 <0.06 (L)  0.60 - 1.20 mmol/L Final   Performed at [facility],  [address], [place], [place] [address]  Admission on 08/05/2023, Discharged on 08/05/2023  Component Date  Value Ref Range Status   Sodium 08/05/2023 141  135 - 145 mmol/L Final   Potassium 08/05/2023 3.7  3.5 - 5.1 mmol/L Final   Chloride 08/05/2023 105  98 - 111 mmol/L Final   CO2 08/05/2023 25  22 - 32 mmol/L Final   Glucose, Bld 08/05/2023 96  70 - 99 mg/dL Final   Glucose reference range applies only to samples taken after fasting for at least 8 hours.   BUN 08/05/2023 23 (H)  6 - 20 mg/dL Final   Creatinine, Ser 08/05/2023 0.73  0.61 - 1.24 mg/dL Final   Calcium 92/75/7974 9.1  8.9 - 10.3 mg/dL Final   Total Protein 92/75/7974 8.4 (H)  6.5 - 8.1 g/dL Final   Albumin 92/75/7974 4.2  3.5 - 5.0 g/dL Final   AST 92/75/7974 48 (H)  15 - 41 U/L Final   ALT 08/05/2023 130 (H)  0 - 44 U/L Final   Alkaline Phosphatase 08/05/2023 89  38 - 126 U/L Final   Total Bilirubin 08/05/2023 1.2  0.0 - 1.2 mg/dL Final   GFR, Estimated 08/05/2023 >60  >60 mL/min Final   Comment: (NOTE) Calculated using the CKD-EPI Creatinine Equation (2021)    Anion gap 08/05/2023 11  5 - 15 Final   Performed at [facility], [address] [place], [place] [address]   Alcohol, Ethyl (B) 08/05/2023 <15  <15 mg/dL Final   Comment: (NOTE) For medical purposes only. Performed at [facility], [address] [place], [place] [address]    WBC 08/05/2023 7.6  4.0 - 10.5 K/uL Final   RBC 08/05/2023 4.39  4.22 - 5.81 MIL/uL Final   Hemoglobin 08/05/2023 12.8 (L)  13.0 - 17.0 g/dL Final   HCT 92/75/7974 38.8 (L)  39.0 - 52.0 % Final   MCV 08/05/2023 88.4  80.0 - 100.0 fL Final   MCH 08/05/2023 29.2  26.0 - 34.0 pg Final   MCHC 08/05/2023 33.0  30.0 - 36.0 g/dL Final   RDW 92/75/7974 13.5  11.5 - 15.5 % Final   Platelets 08/05/2023 256  150 - 400 K/uL Final   nRBC 08/05/2023 0.0  0.0 - 0.2 % Final   Performed at [facility], [address] [place], [place] [address]   Lithium  Lvl  08/05/2023 <0.06 (L)  0.60 - 1.20 mmol/L Final   Performed at [facility], [address] [place], [place] [address]   WBC 08/05/2023 7.5  4.0 - 10.5 K/uL Final   RBC 08/05/2023 4.36  4.22 - 5.81 MIL/uL Final   Hemoglobin 08/05/2023 12.9 (L)  13.0 - 17.0 g/dL Final   HCT 92/75/7974 38.4 (L)  39.0 - 52.0 % Final   MCV 08/05/2023 88.1  80.0 - 100.0 fL Final   MCH 08/05/2023 29.6  26.0 - 34.0 pg Final   MCHC 08/05/2023 33.6  30.0 - 36.0 g/dL Final   RDW 92/75/7974 13.5  11.5 - 15.5 % Final   Platelets 08/05/2023 256  150 - 400 K/uL Final   nRBC 08/05/2023 0.0  0.0 - 0.2 % Final   Neutrophils Relative % 08/05/2023 48  % Final   Neutro Abs 08/05/2023 3.6  1.7 - 7.7 K/uL Final   Lymphocytes Relative 08/05/2023 36  % Final   Lymphs Abs 08/05/2023 2.7  0.7 - 4.0 K/uL Final   Monocytes Relative 08/05/2023 15  % Final   Monocytes Absolute 08/05/2023 1.1 (H)  0.1 - 1.0 K/uL Final   Eosinophils Relative 08/05/2023 1  %  Final   Eosinophils Absolute 08/05/2023 0.1  0.0 - 0.5 K/uL Final   Basophils Relative 08/05/2023 0  % Final   Basophils Absolute 08/05/2023 0.0  0.0 - 0.1 K/uL Final   Immature Granulocytes 08/05/2023 0  % Final   Abs Immature Granulocytes 08/05/2023 0.03  0.00 - 0.07 K/uL Final   Performed at [facility], [address] [place], [place] [address]   Acetaminophen  (Tylenol ), Serum 08/05/2023 <10 (L)  10 - 30 ug/mL Final   Comment: (NOTE) Therapeutic concentrations vary significantly. A range of 10-30 ug/mL  may be an effective concentration for many patients. However, some  are best treated at concentrations outside of this range. Acetaminophen  concentrations >150 ug/mL at 4 hours after ingestion  and >50 ug/mL at 12 hours after ingestion are often associated with  toxic reactions.  Performed at [facility], [address] [place], [place] [address]    Salicylate Lvl 08/05/2023 <7.0 (L)  7.0 - 30.0 mg/dL Final   Performed at [facility], [address] [place], [place] [address]  Admission on 07/27/2023, Discharged on 08/01/2023  Component Date Value Ref Range Status   Hgb A1c MFr Bld 07/29/2023 4.9  4.8 - 5.6 % Final   Comment: (NOTE) Diagnosis of Diabetes The following HbA1c ranges recommended by the American Diabetes Association ([name]) may be used as an aid in the diagnosis of diabetes mellitus.  Hemoglobin             Suggested A1C NGSP%              Diagnosis  <5.7                   Non Diabetic  5.7-6.4                Pre-Diabetic  >6.4                   Diabetic  <7.0                   Glycemic control for                       adults with diabetes.     Mean Plasma Glucose 07/29/2023 93.93  mg/dL Final   Performed at [facility], [address], [place], [place] [address]   Cholesterol 07/29/2023 139  0 - 200 mg/dL Final   Triglycerides 92/82/7974 106  <150 mg/dL Final   HDL 92/82/7974 56  >40 mg/dL Final   Total CHOL/HDL Ratio 07/29/2023 2.5  RATIO Final   VLDL 07/29/2023 21  0 - 40 mg/dL Final   LDL Cholesterol 07/29/2023 62  0 - 99 mg/dL Final   Comment:        Total Cholesterol/HDL:CHD Risk Coronary Heart Disease Risk Table                     Men   Women  1/2 Average Risk   3.4   3.3  Average Risk       5.0   4.4  2 X Average Risk   9.6   7.1  3 X Average Risk  23.4   11.0        Use the calculated Patient Ratio above and the CHD Risk Table to determine the patient's CHD Risk.        ATP III CLASSIFICATION (LDL):  <100     mg/dL  Optimal  100-129  mg/dL   Near or Above                    Optimal  130-159  mg/dL   Borderline  839-810  mg/dL   High  >809     mg/dL   Very High Performed at [facility], [address] [place], [place] [address]    Glucose-Capillary 07/29/2023 91  70 - 99 mg/dL Final   Glucose reference range applies only to samples taken after fasting for at least 8 hours.   Lithium  Lvl 08/01/2023 0.49 (L)  0.60 - 1.20 mmol/L  Final   Performed at [facility], [address] [place], [place] [address]  Admission on 07/26/2023, Discharged on 07/27/2023  Component Date Value Ref Range Status   Sodium 07/26/2023 142  135 - 145 mmol/L Final   Potassium 07/26/2023 3.3 (L)  3.5 - 5.1 mmol/L Final   Chloride 07/26/2023 109  98 - 111 mmol/L Final   CO2 07/26/2023 25  22 - 32 mmol/L Final   Glucose, Bld 07/26/2023 119 (H)  70 - 99 mg/dL Final   Glucose reference range applies only to samples taken after fasting for at least 8 hours.   BUN 07/26/2023 19  6 - 20 mg/dL Final   Creatinine, Ser 07/26/2023 0.84  0.61 - 1.24 mg/dL Final   Calcium 92/85/7974 8.9  8.9 - 10.3 mg/dL Final   Total Protein 92/85/7974 6.9  6.5 - 8.1 g/dL Final   Albumin 92/85/7974 3.9  3.5 - 5.0 g/dL Final   AST 92/85/7974 69 (H)  15 - 41 U/L Final   ALT 07/26/2023 26  0 - 44 U/L Final   Alkaline Phosphatase 07/26/2023 67  38 - 126 U/L Final   Total Bilirubin 07/26/2023 1.3 (H)  0.0 - 1.2 mg/dL Final   GFR, Estimated 07/26/2023 >60  >60 mL/min Final   Comment: (NOTE) Calculated using the CKD-EPI Creatinine Equation (2021)    Anion gap 07/26/2023 8  5 - 15 Final   Performed at [facility], [address] [place], [place] [address]   Alcohol, Ethyl (B) 07/26/2023 <15  <15 mg/dL Final   Comment: (NOTE) For medical purposes only. Performed at [facility], [address] [place], [place] [address]    WBC 07/26/2023 6.5  4.0 - 10.5 K/uL Final   RBC 07/26/2023 4.43  4.22 - 5.81 MIL/uL Final   Hemoglobin 07/26/2023 13.1  13.0 - 17.0 g/dL Final   HCT 92/85/7974 37.9 (L)  39.0 - 52.0 % Final   MCV 07/26/2023 85.6  80.0 - 100.0 fL Final   MCH 07/26/2023 29.6  26.0 - 34.0 pg Final   MCHC 07/26/2023 34.6  30.0 - 36.0 g/dL Final   RDW 92/85/7974 13.2  11.5 - 15.5 % Final   Platelets 07/26/2023 221  150 - 400 K/uL Final   nRBC 07/26/2023 0.0  0.0 - 0.2 % Final   Performed at [facility], [address] [place], [place] [address]   Tricyclic, Ur Screen 07/26/2023 NONE DETECTED  NONE DETECTED Final   Amphetamines, Ur Screen 07/26/2023 NONE DETECTED  NONE DETECTED Final   MDMA (Ecstasy)Ur Screen 07/26/2023 NONE DETECTED  NONE DETECTED Final   Cocaine Metabolite,Ur  07/26/2023 POSITIVE (A)  NONE DETECTED Final   Opiate, Ur Screen 07/26/2023 NONE DETECTED  NONE DETECTED Final   Phencyclidine (PCP) Ur S 07/26/2023 NONE DETECTED  NONE DETECTED Final  Cannabinoid 50 Ng, Ur [place] 07/26/2023 NONE DETECTED  NONE DETECTED Final   Barbiturates, Ur Screen 07/26/2023 NONE DETECTED  NONE DETECTED Final   Benzodiazepine, Ur Scrn 07/26/2023 NONE DETECTED  NONE DETECTED Final   Methadone Scn, Ur 07/26/2023 NONE DETECTED  NONE DETECTED Final   Comment: (NOTE) Tricyclics + metabolites, urine    Cutoff 1000 ng/mL Amphetamines + metabolites, urine  Cutoff 1000 ng/mL MDMA (Ecstasy), urine              Cutoff 500 ng/mL Cocaine Metabolite, urine          Cutoff 300 ng/mL Opiate + metabolites, urine        Cutoff 300 ng/mL Phencyclidine (PCP), urine         Cutoff 25 ng/mL Cannabinoid, urine                 Cutoff 50 ng/mL Barbiturates + metabolites, urine  Cutoff 200 ng/mL Benzodiazepine, urine              Cutoff 200 ng/mL Methadone, urine                   Cutoff 300 ng/mL  The urine drug screen provides only a preliminary, unconfirmed analytical test result and should not be used for non-medical purposes. Clinical consideration and professional judgment should be applied to any positive drug screen result due to possible interfering substances. A more specific alternate chemical method must be used in order to obtain a confirmed analytical result. Gas chromatography / mass spectrometry (GC/MS) is the preferred confirm                          atory method. Performed at [facility], [address] [place], [place] [address]    WBC 07/27/2023 5.6  4.0 - 10.5 K/uL Final   RBC  07/27/2023 4.66  4.22 - 5.81 MIL/uL Final   Hemoglobin 07/27/2023 13.7  13.0 - 17.0 g/dL Final   HCT 92/84/7974 41.1  39.0 - 52.0 % Final   MCV 07/27/2023 88.2  80.0 - 100.0 fL Final   MCH 07/27/2023 29.4  26.0 - 34.0 pg Final   MCHC 07/27/2023 33.3  30.0 - 36.0 g/dL Final   RDW 92/84/7974 13.6  11.5 - 15.5 % Final   Platelets 07/27/2023 195  150 - 400 K/uL Final   nRBC 07/27/2023 0.0  0.0 - 0.2 % Final   Neutrophils Relative % 07/27/2023 49  % Final   Neutro Abs 07/27/2023 2.7  1.7 - 7.7 K/uL Final   Lymphocytes Relative 07/27/2023 40  % Final   Lymphs Abs 07/27/2023 2.2  0.7 - 4.0 K/uL Final   Monocytes Relative 07/27/2023 9  % Final   Monocytes Absolute 07/27/2023 0.5  0.1 - 1.0 K/uL Final   Eosinophils Relative 07/27/2023 2  % Final   Eosinophils Absolute 07/27/2023 0.1  0.0 - 0.5 K/uL Final   Basophils Relative 07/27/2023 0  % Final   Basophils Absolute 07/27/2023 0.0  0.0 - 0.1 K/uL Final   Immature Granulocytes 07/27/2023 0  % Final   Abs Immature Granulocytes 07/27/2023 0.01  0.00 - 0.07 K/uL Final   Performed at [facility], [address] [place], [place] [address]  Admission on 05/25/2023, Discharged on 06/05/2023  Component Date Value Ref Range Status   Folate 05/26/2023 5.7 (L)  >5.9 ng/mL Final   Performed at [facility], [address] [place], [place]  [address]   RPR Ser Ql 05/26/2023 NON REACTIVE  NON REACTIVE Final   Performed at [facility], [address] [place], [place] [address]   TSH 05/26/2023 1.178  0.350 - 4.500 uIU/mL Final   Comment: Performed by a 3rd Generation assay with a functional sensitivity of <=0.01 uIU/mL. Performed at [facility], [address], [place], [place] [address]    Vitamin B-12 05/26/2023 177 (L)  180 - 914 pg/mL Final   Comment: (NOTE) This assay is not validated for testing neonatal or myeloproliferative syndrome specimens for Vitamin B12 levels. Performed at [facility], [address], [place], [place] [address]    Vit D, 25-Hydroxy 05/26/2023 39.67  30 - 100 ng/mL Final   Comment: (NOTE) Vitamin D  deficiency has been defined by the Institute of Medicine  and an Endocrine Society practice guideline as a level of serum 25-OH  vitamin D  less than 20 ng/mL (1,2). The Endocrine Society went on to  further define vitamin D  insufficiency as a level between 21 and 29  ng/mL (2).  1. IOM (Institute of Medicine). 2010. Dietary reference intakes for  calcium and D. [place]: [employer]. 2. Holick MF, Binkley [place], Bischoff-Ferrari HA, et al. Evaluation,  treatment, and prevention of vitamin D  deficiency: an Endocrine  Society clinical practice guideline, JCEM. 2011 Jul; 96(7): 1911-30.  Performed at [facility], [address], [place], [place] [address]    HIV Screen 4th Generation wRfx 05/26/2023 Non Reactive  Non Reactive Final   Performed at [facility], [address] [place], [place] [address]   WBC 05/28/2023 7.3  4.0 - 10.5 K/uL Final   RBC 05/28/2023 4.61  4.22 - 5.81 MIL/uL Final   Hemoglobin 05/28/2023 13.4  13.0 - 17.0 g/dL Final   HCT 94/83/7974 42.3  39.0 - 52.0 % Final   MCV 05/28/2023 91.8  80.0 - 100.0 fL Final   MCH 05/28/2023 29.1  26.0 - 34.0 pg Final   MCHC 05/28/2023 31.7  30.0 - 36.0 g/dL Final   RDW 94/83/7974 13.2  11.5 - 15.5 % Final   Platelets 05/28/2023 216  150 - 400 K/uL Final   nRBC 05/28/2023 0.0  0.0 - 0.2 % Final   Performed at [facility], [address] [place], [place] [address]   CRP, High Sensitivity 05/28/2023 8.77 (H)  0.00 - 3.00 mg/L Final   Comment: (NOTE)         Relative Risk for Future Cardiovascular Event                             Low                 <1.00                             Average       1.00 - 3.00                             High                >3.00 Performed At: [facility] [address]  [place], [place] [id number] Jennette Shorter MD Ey:[id number]    Neutrophils Relative % 05/28/2023 63  % Final   Neutro Abs  05/28/2023 4.6  1.7 - 7.7 K/uL Final   Lymphocytes Relative 05/28/2023 29  % Final   Lymphs Abs 05/28/2023 2.2  0.7 - 4.0 K/uL Final   Monocytes Relative 05/28/2023 7  % Final   Monocytes Absolute 05/28/2023 0.5  0.1 - 1.0 K/uL Final   Eosinophils Relative 05/28/2023 1  % Final   Eosinophils Absolute 05/28/2023 0.0  0.0 - 0.5 K/uL Final   Basophils Relative 05/28/2023 0  % Final   Basophils Absolute 05/28/2023 0.0  0.0 - 0.1 K/uL Final   Immature Granulocytes 05/28/2023 0  % Final   Abs Immature Granulocytes 05/28/2023 0.02  0.00 - 0.07 K/uL Final   Performed at [facility], [address] [place], [place] [address]   Troponin I (High Sensitivity) 05/28/2023 <2  <18 ng/L Final   Comment: (NOTE) Elevated high sensitivity troponin I (hsTnI) values and significant  changes across serial measurements may suggest ACS but many other  chronic and acute conditions are known to elevate hsTnI results.  Refer to the Links section for chest pain algorithms and additional  guidance. Performed at [facility], [address] [place], [place] [address]    WBC 06/01/2023 7.0  4.0 - 10.5 K/uL Final   RBC 06/01/2023 4.66  4.22 - 5.81 MIL/uL Final   Hemoglobin 06/01/2023 13.7  13.0 - 17.0 g/dL Final   HCT 94/79/7974 41.9  39.0 - 52.0 % Final   MCV 06/01/2023 89.9  80.0 - 100.0 fL Final   MCH 06/01/2023 29.4  26.0 - 34.0 pg Final   MCHC 06/01/2023 32.7  30.0 - 36.0 g/dL Final   RDW 94/79/7974 13.5  11.5 - 15.5 % Final   Platelets 06/01/2023 218  150 - 400 K/uL Final   nRBC 06/01/2023 0.0  0.0 - 0.2 % Final   Neutrophils Relative % 06/01/2023 79  % Final   Neutro Abs 06/01/2023 5.5  1.7 - 7.7 K/uL Final   Lymphocytes Relative 06/01/2023 13  % Final   Lymphs Abs 06/01/2023 0.9  0.7 - 4.0 K/uL Final   Monocytes Relative 06/01/2023 7  %  Final   Monocytes Absolute 06/01/2023 0.5  0.1 - 1.0 K/uL Final   Eosinophils Relative 06/01/2023 1  % Final   Eosinophils Absolute 06/01/2023 0.0  0.0 - 0.5 K/uL Final   Basophils Relative 06/01/2023 0  % Final   Basophils Absolute 06/01/2023 0.0  0.0 - 0.1 K/uL Final   Immature Granulocytes 06/01/2023 0  % Final   Abs Immature Granulocytes 06/01/2023 0.02  0.00 - 0.07 K/uL Final   Performed at [facility], [address] [place], [place] [address]   Clozapine  Lvl 06/01/2023 660 (H)  350 - 600 ng/mL Final   Comment: (NOTE) This test was developed and its performance characteristics determined by [facility]. It has not been cleared or approved by the Food and Drug Administration.    NorClozapine 06/01/2023 121  Not Estab. ng/mL Final   Comment: (NOTE) This test was developed and its performance characteristics determined by [facility]. It has not been cleared or approved by the Food and Drug Administration.    Total(Cloz+Norcloz) 06/01/2023 781  ng/mL Final   Comment: (NOTE) Plasma concentrations of clozapine  plus norclozapine (combined total) greater than 450 ng/mL have been associated with therapeutic effect.  Detection Limit = 20 Performed At: [facility] [address] [place], [place] [id number] Jennette Shorter MD Ey:[id number]    CRP 06/01/2023 1.7 (H)  <1.0 mg/dL Final   Performed at [facility], [address] [place], [place] [address]   Troponin I (High Sensitivity) 06/01/2023 7  <18 ng/L Final   Comment: (NOTE) Elevated high sensitivity troponin I (hsTnI) values and significant  changes across serial measurements may suggest ACS but many other  chronic and acute conditions are known to elevate hsTnI results.  Refer to the Links section for chest pain algorithms and additional  guidance. Performed at [facility], [address], [place], [place] [address]    Lithium  Lvl 06/05/2023  0.81  0.60 - 1.20 mmol/L Final   Performed at [facility], [address], [place], [place] [address]  Admission on 05/24/2023, Discharged on 05/25/2023  Component Date Value Ref Range Status   Acetaminophen  (Tylenol ), Serum 05/24/2023 <10 (L)  10 - 30 ug/mL Final   Comment: (NOTE) Therapeutic concentrations vary significantly. A range of 10-30 ug/mL  may be an effective concentration for many patients. However, some  are best treated at concentrations outside of this range. Acetaminophen  concentrations >150 ug/mL at 4 hours after ingestion  and >50 ug/mL at 12 hours after ingestion are often associated with  toxic reactions.  Performed at [facility], [address] [place], [place] [address]    Alcohol, Ethyl (B) 05/24/2023 <15  <15 mg/dL Final   Comment: Please note change in reference range. (NOTE) For medical purposes only. Performed at [facility], [address] [place], [place] [address]    Salicylate Lvl 05/24/2023 <7.0 (L)  7.0 - 30.0 mg/dL Final   Performed at [facility], [address] [place], [place] [address]   Sodium 05/24/2023 140  135 - 145 mmol/L Final   Potassium 05/24/2023 3.8  3.5 - 5.1 mmol/L Final   Chloride 05/24/2023 109  98 - 111 mmol/L Final   CO2 05/24/2023 25  22 - 32 mmol/L Final   Glucose, Bld 05/24/2023 91  70 - 99 mg/dL Final   Glucose reference range applies only to samples taken after fasting for at least 8 hours.   BUN 05/24/2023 16  6 - 20 mg/dL Final   Creatinine, Ser 05/24/2023 0.85  0.61 - 1.24 mg/dL Final   Calcium 94/87/7974 9.1  8.9 - 10.3 mg/dL Final   Total Protein 94/87/7974 7.0  6.5 - 8.1 g/dL Final   Albumin 94/87/7974 3.9  3.5 - 5.0 g/dL Final   AST 94/87/7974 19  15 - 41 U/L Final   ALT 05/24/2023 23  0 - 44 U/L Final   Alkaline Phosphatase 05/24/2023 50  38 - 126 U/L Final   Total Bilirubin 05/24/2023 0.8  0.0 - 1.2 mg/dL Final   GFR, Estimated 05/24/2023 >60  >60  mL/min Final   Comment: (NOTE) Calculated using the CKD-EPI Creatinine Equation (2021)    Anion gap 05/24/2023 6  5 - 15 Final   Performed at [facility], [address] [place], [place] [address]   WBC 05/24/2023 6.4  4.0 - 10.5 K/uL Final   RBC 05/24/2023 4.67  4.22 - 5.81 MIL/uL Final   Hemoglobin 05/24/2023 13.7  13.0 - 17.0 g/dL Final   HCT 94/87/7974 40.1  39.0 - 52.0 % Final   MCV 05/24/2023 85.9  80.0 - 100.0 fL Final   MCH 05/24/2023 29.3  26.0 -  34.0 pg Final   MCHC 05/24/2023 34.2  30.0 - 36.0 g/dL Final   RDW 94/87/7974 12.7  11.5 - 15.5 % Final   Platelets 05/24/2023 231  150 - 400 K/uL Final   nRBC 05/24/2023 0.0  0.0 - 0.2 % Final   Neutrophils Relative % 05/24/2023 47  % Final   Neutro Abs 05/24/2023 3.0  1.7 - 7.7 K/uL Final   Lymphocytes Relative 05/24/2023 40  % Final   Lymphs Abs 05/24/2023 2.5  0.7 - 4.0 K/uL Final   Monocytes Relative 05/24/2023 12  % Final   Monocytes Absolute 05/24/2023 0.8  0.1 - 1.0 K/uL Final   Eosinophils Relative 05/24/2023 1  % Final   Eosinophils Absolute 05/24/2023 0.0  0.0 - 0.5 K/uL Final   Basophils Relative 05/24/2023 0  % Final   Basophils Absolute 05/24/2023 0.0  0.0 - 0.1 K/uL Final   Immature Granulocytes 05/24/2023 0  % Final   Abs Immature Granulocytes 05/24/2023 0.02  0.00 - 0.07 K/uL Final   Performed at [facility], [address] [place], [place] [address]   Tricyclic, Ur Screen 05/24/2023 NONE DETECTED  NONE DETECTED Final   Amphetamines, Ur Screen 05/24/2023 NONE DETECTED  NONE DETECTED Final   MDMA (Ecstasy)Ur Screen 05/24/2023 NONE DETECTED  NONE DETECTED Final   Cocaine Metabolite,Ur [place] 05/24/2023 POSITIVE (A)  NONE DETECTED Final   Opiate, Ur Screen 05/24/2023 NONE DETECTED  NONE DETECTED Final   Phencyclidine (PCP) Ur S 05/24/2023 NONE DETECTED  NONE DETECTED Final   Cannabinoid 50 Ng, Ur  05/24/2023 NONE DETECTED  NONE DETECTED Final   Barbiturates, Ur Screen 05/24/2023 NONE DETECTED   NONE DETECTED Final   Benzodiazepine, Ur Scrn 05/24/2023 POSITIVE (A)  NONE DETECTED Final   Methadone Scn, Ur 05/24/2023 NONE DETECTED  NONE DETECTED Final   Comment: (NOTE) Tricyclics + metabolites, urine    Cutoff 1000 ng/mL Amphetamines + metabolites, urine  Cutoff 1000 ng/mL MDMA (Ecstasy), urine              Cutoff 500 ng/mL Cocaine Metabolite, urine          Cutoff 300 ng/mL Opiate + metabolites, urine        Cutoff 300 ng/mL Phencyclidine (PCP), urine         Cutoff 25 ng/mL Cannabinoid, urine                 Cutoff 50 ng/mL Barbiturates + metabolites, urine  Cutoff 200 ng/mL Benzodiazepine, urine              Cutoff 200 ng/mL Methadone, urine                   Cutoff 300 ng/mL  The urine drug screen provides only a preliminary, unconfirmed analytical test result and should not be used for non-medical purposes. Clinical consideration and professional judgment should be applied to any positive drug screen result due to possible interfering substances. A more specific alternate chemical method must be used in order to obtain a confirmed analytical result. Gas chromatography / mass spectrometry (GC/MS) is the preferred confirm                          atory method. Performed at [facility], [address], [place], [place] [address]   Admission on 05/10/2023, Discharged on 05/11/2023  Component Date Value Ref Range Status   WBC 05/10/2023 5.3  4.0 - 10.5 K/uL Final  RBC 05/10/2023 5.26  4.22 - 5.81 MIL/uL Final   Hemoglobin 05/10/2023 15.5  13.0 - 17.0 g/dL Final   HCT 95/71/7974 45.6  39.0 - 52.0 % Final   MCV 05/10/2023 86.7  80.0 - 100.0 fL Final   MCH 05/10/2023 29.5  26.0 - 34.0 pg Final   MCHC 05/10/2023 34.0  30.0 - 36.0 g/dL Final   RDW 95/71/7974 12.9  11.5 - 15.5 % Final   Platelets 05/10/2023 187  150 - 400 K/uL Final   nRBC 05/10/2023 0.0  0.0 - 0.2 % Final   Performed at [facility], [address] [place], [place] [address]   Sodium  05/10/2023 138  135 - 145 mmol/L Final   Potassium 05/10/2023 3.4 (L)  3.5 - 5.1 mmol/L Final   Chloride 05/10/2023 101  98 - 111 mmol/L Final   CO2 05/10/2023 26  22 - 32 mmol/L Final   Glucose, Bld 05/10/2023 78  70 - 99 mg/dL Final   Glucose reference range applies only to samples taken after fasting for at least 8 hours.   BUN 05/10/2023 15  6 - 20 mg/dL Final   Creatinine, Ser 05/10/2023 1.02  0.61 - 1.24 mg/dL Final   Calcium 95/71/7974 9.2  8.9 - 10.3 mg/dL Final   Total Protein 95/71/7974 7.6  6.5 - 8.1 g/dL Final   Albumin 95/71/7974 4.3  3.5 - 5.0 g/dL Final   AST 95/71/7974 19  15 - 41 U/L Final   ALT 05/10/2023 16  0 - 44 U/L Final   Alkaline Phosphatase 05/10/2023 55  38 - 126 U/L Final   Total Bilirubin 05/10/2023 0.4  0.0 - 1.2 mg/dL Final   GFR, Estimated 05/10/2023 >60  >60 mL/min Final   Comment: (NOTE) Calculated using the CKD-EPI Creatinine Equation (2021)    Anion gap 05/10/2023 11  5 - 15 Final   Performed at [facility], [address] [place], [place] [address]   Acetaminophen  (Tylenol ), Serum 05/10/2023 <10 (L)  10 - 30 ug/mL Final   Comment: (NOTE) Therapeutic concentrations vary significantly. A range of 10-30 ug/mL  may be an effective concentration for many patients. However, some  are best treated at concentrations outside of this range. Acetaminophen  concentrations >150 ug/mL at 4 hours after ingestion  and >50 ug/mL at 12 hours after ingestion are often associated with  toxic reactions.  Performed at [facility], [address] [place], [place] [address]    Salicylate Lvl 05/10/2023 <7.0 (L)  7.0 - 30.0 mg/dL Final   Performed at [facility], [address] [place], [place] [address]   Alcohol, Ethyl (B) 05/10/2023 <15  <15 mg/dL Final   Comment: Please note change in reference range. (NOTE) For medical purposes only. Performed at [facility], [address] [place], [place] [address]   Admission  on 05/08/2023, Discharged on 05/08/2023  Component Date Value Ref Range Status   Acetaminophen  (Tylenol ), Serum 05/08/2023 <10 (L)  10 - 30 ug/mL Final   Comment: (NOTE) Therapeutic concentrations vary significantly. A range of 10-30 ug/mL  may be an effective concentration for many patients. However, some  are best treated at concentrations outside of this range. Acetaminophen  concentrations >150 ug/mL at 4 hours after ingestion  and >50 ug/mL at 12 hours after ingestion are often associated with  toxic reactions.  Performed at [facility], [address] [place], [place] [address]    Sodium 05/08/2023 141  135 - 145 mmol/L  Final   Potassium 05/08/2023 3.4 (L)  3.5 - 5.1 mmol/L Final   Chloride 05/08/2023 107  98 - 111 mmol/L Final   CO2 05/08/2023 25  22 - 32 mmol/L Final   Glucose, Bld 05/08/2023 82  70 - 99 mg/dL Final   Glucose reference range applies only to samples taken after fasting for at least 8 hours.   BUN 05/08/2023 13  6 - 20 mg/dL Final   Creatinine, Ser 05/08/2023 0.87  0.61 - 1.24 mg/dL Final   Calcium 95/73/7974 9.3  8.9 - 10.3 mg/dL Final   Total Protein 95/73/7974 7.4  6.5 - 8.1 g/dL Final   Albumin 95/73/7974 4.2  3.5 - 5.0 g/dL Final   AST 95/73/7974 18  15 - 41 U/L Final   ALT 05/08/2023 14  0 - 44 U/L Final   Alkaline Phosphatase 05/08/2023 53  38 - 126 U/L Final   Total Bilirubin 05/08/2023 0.6  0.0 - 1.2 mg/dL Final   GFR, Estimated 05/08/2023 >60  >60 mL/min Final   Comment: (NOTE) Calculated using the CKD-EPI Creatinine Equation (2021)    Anion gap 05/08/2023 9  5 - 15 Final   Performed at [facility], [address] [place], [place] [address]   Alcohol, Ethyl (B) 05/08/2023 <15  <15 mg/dL Final   Comment: Please note change in reference range. (NOTE) For medical purposes only. Performed at [facility], [address] [place], [place] [address]    Salicylate Lvl 05/08/2023 <7.0 (L)  7.0 - 30.0 mg/dL Final    Performed at [facility], [address] [place], [place] [address]   WBC 05/08/2023 8.7  4.0 - 10.5 K/uL Final   RBC 05/08/2023 5.22  4.22 - 5.81 MIL/uL Final   Hemoglobin 05/08/2023 15.4  13.0 - 17.0 g/dL Final   HCT 95/73/7974 45.9  39.0 - 52.0 % Final   MCV 05/08/2023 87.9  80.0 - 100.0 fL Final   MCH 05/08/2023 29.5  26.0 - 34.0 pg Final   MCHC 05/08/2023 33.6  30.0 - 36.0 g/dL Final   RDW 95/73/7974 12.9  11.5 - 15.5 % Final   Platelets 05/08/2023 203  150 - 400 K/uL Final   nRBC 05/08/2023 0.0  0.0 - 0.2 % Final   Neutrophils Relative % 05/08/2023 70  % Final   Neutro Abs 05/08/2023 6.1  1.7 - 7.7 K/uL Final   Lymphocytes Relative 05/08/2023 22  % Final   Lymphs Abs 05/08/2023 1.9  0.7 - 4.0 K/uL Final   Monocytes Relative 05/08/2023 8  % Final   Monocytes Absolute 05/08/2023 0.7  0.1 - 1.0 K/uL Final   Eosinophils Relative 05/08/2023 0  % Final   Eosinophils Absolute 05/08/2023 0.0  0.0 - 0.5 K/uL Final   Basophils Relative 05/08/2023 0  % Final   Basophils Absolute 05/08/2023 0.0  0.0 - 0.1 K/uL Final   Immature Granulocytes 05/08/2023 0  % Final   Abs Immature Granulocytes 05/08/2023 0.03  0.00 - 0.07 K/uL Final   Performed at [facility], [address] [place], [place] [address]  Admission on 05/06/2023, Discharged on 05/06/2023  Component Date Value Ref Range Status   Sodium 05/06/2023 140  135 - 145 mmol/L Final   Potassium 05/06/2023 3.2 (L)  3.5 - 5.1 mmol/L Final   Chloride 05/06/2023 109  98 - 111 mmol/L Final   CO2 05/06/2023 23  22 - 32 mmol/L Final   Glucose, Bld 05/06/2023 80  70 -  99 mg/dL Final   Glucose reference range applies only to samples taken after fasting for at least 8 hours.   BUN 05/06/2023 14  6 - 20 mg/dL Final   Creatinine, Ser 05/06/2023 0.76  0.61 - 1.24 mg/dL Final   Calcium 95/75/7974 9.0  8.9 - 10.3 mg/dL Final   Total Protein 95/75/7974 7.1  6.5 - 8.1 g/dL Final   Albumin 95/75/7974 4.2  3.5 - 5.0 g/dL Final    AST 95/75/7974 15  15 - 41 U/L Final   ALT 05/06/2023 14  0 - 44 U/L Final   Alkaline Phosphatase 05/06/2023 46  38 - 126 U/L Final   Total Bilirubin 05/06/2023 0.7  0.0 - 1.2 mg/dL Final   GFR, Estimated 05/06/2023 >60  >60 mL/min Final   Comment: (NOTE) Calculated using the CKD-EPI Creatinine Equation (2021)    Anion gap 05/06/2023 8  5 - 15 Final   Performed at [facility], [address] [place], [place] [address]   WBC 05/06/2023 6.2  4.0 - 10.5 K/uL Final   RBC 05/06/2023 4.71  4.22 - 5.81 MIL/uL Final   Hemoglobin 05/06/2023 14.0  13.0 - 17.0 g/dL Final   HCT 95/75/7974 40.5  39.0 - 52.0 % Final   MCV 05/06/2023 86.0  80.0 - 100.0 fL Final   MCH 05/06/2023 29.7  26.0 - 34.0 pg Final   MCHC 05/06/2023 34.6  30.0 - 36.0 g/dL Final   RDW 95/75/7974 13.0  11.5 - 15.5 % Final   Platelets 05/06/2023 206  150 - 400 K/uL Final   nRBC 05/06/2023 0.0  0.0 - 0.2 % Final   Neutrophils Relative % 05/06/2023 64  % Final   Neutro Abs 05/06/2023 4.0  1.7 - 7.7 K/uL Final   Lymphocytes Relative 05/06/2023 28  % Final   Lymphs Abs 05/06/2023 1.7  0.7 - 4.0 K/uL Final   Monocytes Relative 05/06/2023 8  % Final   Monocytes Absolute 05/06/2023 0.5  0.1 - 1.0 K/uL Final   Eosinophils Relative 05/06/2023 0  % Final   Eosinophils Absolute 05/06/2023 0.0  0.0 - 0.5 K/uL Final   Basophils Relative 05/06/2023 0  % Final   Basophils Absolute 05/06/2023 0.0  0.0 - 0.1 K/uL Final   Immature Granulocytes 05/06/2023 0  % Final   Abs Immature Granulocytes 05/06/2023 0.01  0.00 - 0.07 K/uL Final   Performed at [facility], [address] [place], [place] [address]  There may be more visits with results that are not included.    Allergies: Ibuprofen, Risperidone and paliperidone, Ziprasidone , Benztropine , and Quetiapine   Medications:  Facility Ordered Medications  Medication   [COMPLETED] potassium chloride  SA (KLOR-CON  M) CR tablet 40 mEq   acetaminophen  (TYLENOL ) tablet 650 mg    alum & mag hydroxide-simeth (MAALOX/MYLANTA) 200-200-20 MG/5ML suspension 30 mL   magnesium  hydroxide (MILK OF MAGNESIA) suspension 30 mL   haloperidol  (HALDOL ) tablet 5 mg   And   diphenhydrAMINE  (BENADRYL ) capsule 50 mg   haloperidol  lactate (HALDOL ) injection 5 mg   And   diphenhydrAMINE  (BENADRYL ) injection 50 mg   And   LORazepam  (ATIVAN ) injection 2 mg   haloperidol  lactate (HALDOL ) injection 10 mg   And   diphenhydrAMINE  (BENADRYL ) injection 50 mg   And   LORazepam  (ATIVAN ) injection 2 mg   hydrOXYzine  (ATARAX ) tablet 25 mg   traZODone  (DESYREL ) tablet 50 mg   cloZAPine  (CLOZARIL ) tablet 25 mg   lithium  carbonate (LITHOBID ) ER tablet 600 mg  PTA Medications  Medication Sig   senna (SENOKOT) 8.6 MG TABS tablet Take 2 tablets (17.2 mg total) by mouth at bedtime. (Patient not taking: Reported on 08/08/2023)   cloZAPine  (CLOZARIL ) 100 MG tablet Take 1 tablet (100 mg total) by mouth at bedtime. (Patient not taking: Reported on 08/08/2023)   hydrOXYzine  (ATARAX ) 25 MG tablet Take 1 tablet (25 mg total) by mouth every 6 (six) hours as needed for anxiety. (Patient not taking: Reported on 08/08/2023)   lithium  carbonate (ESKALITH ) 450 MG ER tablet Take 2 tablets (900 mg total) by mouth at bedtime. (Patient not taking: Reported on 08/08/2023)   traZODone  (DESYREL ) 50 MG tablet Take 1 tablet (50 mg total) by mouth at bedtime as needed for sleep. (Patient not taking: Reported on 08/08/2023)    Long Term Goals: Improvement in symptoms so as ready for discharge  Short Term Goals: Patient will verbalize feelings in meetings with treatment team members., Patient will attend at least of 50% of the groups daily., Pt will complete the PHQ9 on admission, day 3 and discharge., Patient will participate in completing the [place] Suicide Severity Rating Scale, Patient will score a low risk of violence for 24 hours prior to discharge, and Patient will take medications as prescribed daily.  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 Patient was discharged from [facility] 7 days ago and did not fill his medications, previous admission to [facility] in May as well. Clozapine  and lithium  was restarted the last 2 admission due to failures with Abilify  and RIsperdal. Patient presenting to ED with SI yesterday but denies today. Denies all symptoms but is responding to internal stimuli. Denies SI or HI today.  Patient stabilized on Clozapine  last week as well as lithium  but did not fill any of his medications.We will gradually titrate Clozapine  slowly as patient has been off it for over 48 hours. During his first week, we will increase by 25 mg each day if tolerated. 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  Schizoaffective disorder bipolar type - restart lithium  ER 900 mg at bedtime which patient was stabilized on last week -restart Clozapine  25 mg at bedtime as patient has not taken for >48 hours  Cocaine abuse -Patient denies recent substance abuse however UDS + for cocaine and cannabis.  Cannabis abuse  Recommendations  Based on my evaluation the patient does not appear to have an emergency medical condition.  Verina Galeno, MD 08/09/23  2:57 PM

## 2023-08-09 NOTE — Group Note (Signed)
 Group Topic: Positive Affirmations  Group Date: 08/09/2023 Start Time: 9184 End Time: 0830 Facilitators: Lonzell Dwayne RAMAN, NT  Department: [facility]  Number of Participants: 1  Group Focus: feeling awareness/expression Treatment Modality:  Individual Therapy Interventions utilized were patient education Purpose: regain self-worth  Name: [name] Date of Birth: 05-29-1990  MR: [id number]    Level of Participation: Patient did not attend group Quality of Participation: N/A Interactions with others: N/A Mood/Affect: N/A Triggers (if applicable): N/A Cognition: N/A Progress: N/A Response: N/A Plan: N/A  Patients Problems:  Patient Active Problem List   Diagnosis Date Noted   Schizophrenia, acute (HCC) 07/27/2023   Schizoaffective disorder, bipolar type (HCC) 05/25/2023   Schizophrenia (HCC) 05/25/2023   Cocaine use 05/11/2023   Aggressive behavior 05/11/2023   Cocaine abuse (HCC) 05/18/2022   Schizoaffective disorder (HCC) 06/27/2019   Schizophrenia, paranoid (HCC) 03/12/2019   Cocaine abuse with intoxication (HCC)    Self-inflicted laceration of right wrist (HCC) 11/30/2018   Suicidal ideation 11/29/2018   Cocaine abuse with cocaine-induced mood disorder (HCC) 11/06/2018   Agitation    Cannabis abuse 11/10/2017

## 2023-08-09 NOTE — ED Notes (Signed)
 Patient is sleeping. Respirations equal and unlabored. No change in assessment or acuity. Routine safety checks conducted according to facility protocol.

## 2023-08-10 ENCOUNTER — Other Ambulatory Visit: Payer: Self-pay

## 2023-08-10 DIAGNOSIS — F250 Schizoaffective disorder, bipolar type: Secondary | ICD-10-CM | POA: Diagnosis not present

## 2023-08-10 DIAGNOSIS — F1210 Cannabis abuse, uncomplicated: Secondary | ICD-10-CM | POA: Diagnosis not present

## 2023-08-10 DIAGNOSIS — F1410 Cocaine abuse, uncomplicated: Secondary | ICD-10-CM | POA: Diagnosis not present

## 2023-08-10 MED ORDER — LITHIUM CARBONATE ER 300 MG PO TBCR: 600.0000 mg | EXTENDED_RELEASE_TABLET | Freq: Every day | ORAL | 0 refills | Status: DC

## 2023-08-10 MED ORDER — CLOZAPINE 100 MG PO TABS: 100.0000 mg | ORAL_TABLET | Freq: Every day | ORAL | 0 refills | Status: DC

## 2023-08-10 NOTE — ED Notes (Signed)
 Pt is sleeping . No acute distress noted.

## 2023-08-10 NOTE — Group Note (Signed)
 Group Topic: Overcoming Obstacles  Group Date: 08/10/2023 Start Time: 1000 End Time: 1045 Facilitators: Alyse Leilani LABOR, NT  Department: [facility]  Number of Participants: 7  Group Focus: clarity of thought and coping skills Treatment Modality:  Patient-Centered Therapy and Skills Training Interventions utilized were group exercise, patient education, and story telling Purpose: improve communication skills and increase insight  Name: [name] Date of Birth: 02-Sep-1990  MR: [id number]    Level of Participation: active Quality of Participation: engaged Interactions with others: gave feedback Mood/Affect: positive Triggers (if applicable): none Cognition: coherent/clear Progress:  Response: none Plan: patient will be encouraged to attend groups  Patients Problems:  Patient Active Problem List   Diagnosis Date Noted   Schizophrenia, acute (HCC) 07/27/2023   Schizoaffective disorder, bipolar type (HCC) 05/25/2023   Schizophrenia (HCC) 05/25/2023   Cocaine use 05/11/2023   Aggressive behavior 05/11/2023   Cocaine abuse (HCC) 05/18/2022   Schizoaffective disorder (HCC) 06/27/2019   Schizophrenia, paranoid (HCC) 03/12/2019   Cocaine abuse with intoxication (HCC)    Self-inflicted laceration of right wrist (HCC) 11/30/2018   Suicidal ideation 11/29/2018   Cocaine abuse with cocaine-induced mood disorder (HCC) 11/06/2018   Agitation    Cannabis abuse 11/10/2017

## 2023-08-10 NOTE — Discharge Planning (Signed)
 Per provider, patient is stable for DC today. SW called his mother, Myrick to verify his safe return to home and patient stated he wanted a bus pass to get home. He has a follow up assessment scheduled for [facility] on this Friday at 11:00 for psychiatry/medication management and ACT team referral.  Patients mother voiced appreciation and agreement with plan for him to return to her home today in the next couple of hours.

## 2023-08-10 NOTE — ED Notes (Signed)
 Pt alert and cooperative.  Patient focus is on being discharged tomorrow 08/11/2023 and he offers no other concerns. Behavior has been controlled. Denies si/hi/avh. Will continue to monitor and report changes as noted.

## 2023-08-10 NOTE — ED Provider Notes (Signed)
 FBC/OBS ASAP Discharge Summary  Date and Time: 08/10/2023 3:08 PM  Name: [name]  MRN:  [id number]   Discharge Diagnoses:  Final diagnoses:  Schizoaffective disorder, bipolar type (HCC)  Cocaine abuse (HCC)  Cannabis abuse   Subjective: [name] reported that it was critical he discharged today even though discharge was planned for tomorrow. He became somewhat irritable when informed I could not guarantee his discharge today but would make my best effort. Eventually patient acknowledged that he really needed to access his phone to prevent someone from diverting his [employer] funds. I inquired if that would abrogate the need for imminent discharge and patient affirmed. After getting his phone (at 2%) and addressing the issue, he became quite euthymic. Unfortunately, he still insisted on expedited discharge.  Patient claims 100% medication compliance since discharging from hospital last week, yet mother confirmed prescriptions were never picked up and patient has been noncompliant despite a history of positive response to clozapine /lithium . Regardless, today patient did agree to resume/continue to take medication as prescribed. He denied adverse effects from clozapine /lithium  at the [facility].   Stay Summary: 33 year old African-American male with history of schizophrenia paranoid type versus schizoaffective disorder. Patient was discharged from [facility] 7 days ago (last week) and did not fill his medications, previous admission to [facility] in May 2025 as well. Clozapine  and lithium  was restarted the last 2 admission due to failures with Abilify  and RIsperdal. Patient presenting to ED with SI yesterday.   On admission patient was internally preoccupied with thought blocking but denied SI, HI or AVH. Patient is vague historian and states that he came into the hospital because of a situation. He cannot name his medications or what doses he is taking stating his mother gives him his medications.    Patient  gave me his mother's contact and gave me permission to speak with her. She reports he has had significant psychosis in the past. States he was just discharged from [facility] on 08/01/23 and did not fill his medications at outpatient pharmacy. Discussed on chart review they noted a lack of any response to Abilify  and Risperdal and she confirms he has adverse reactions to Risperdal and Geodon . States patient has been doing better recently than his usual psychosis. States Clozapine  seems to work when he is taking it along with lithium . Patient denied recent substance abuse however UDS + for cocaine and cannabis.  Lithium  restarted at 600mg  at bedtime along with clozapine  25mg  (25% of outpatient dose). Medication appeared well-tolerated.  Total Time spent with patient: I personally spent 40 minutes on the unit in direct patient care. The direct patient care time included face-to-face time with the patient, reviewing the patient's chart, communicating with other professionals, and coordinating care. Greater than 50% of this time was spent in counseling or coordinating care with the patient regarding goals of hospitalization, psycho-education, and discharge planning needs.   On my assessment the patient denied SI, HI, AVH, paranoia, ideas of reference, or first rank symptoms on day of discharge. Patient denied drug cravings or active signs of withdrawal. Patient denied medication side-effects. Patient was not deemed to be a danger to self or others on day of discharge and was in agreement with discharge plans.    I have independently evaluated the patient during a face-to-face assessment on the day of discharge. I reviewed the patient's chart and completed all components of discharge.  Past Psychiatric History:  Diagnoses: history of schizoaffective or schizophrenia disorder and a history of cocaine  abuse.  [facility] [place].  He was referred for  outpatient follow-up at [facility] last time and was on appropriate medication.  He cannot remember the medicines and it is unclear how much he ever followed up and this is similar pattern this admission to 2 prior this year at [facility].  Clear history of cocaine abuse which has complicated any assessment of his other mental health problems.     Patient has prior suicide attempts althought will not elaborate.  Past Medical History: B12 deficiency, folate deficiency, constipation (possibly iatrogenic-clozapine )  Current Medications:  Current Facility-Administered Medications  Medication Dose Route Frequency Provider Last Rate Last Admin   acetaminophen  (TYLENOL ) tablet 650 mg  650 mg Oral Q6H PRN White, Patrice L, NP       alum & mag hydroxide-simeth (MAALOX/MYLANTA) 200-200-20 MG/5ML suspension 30 mL  30 mL Oral Q4H PRN White, Patrice L, NP       cloZAPine  (CLOZARIL ) tablet 25 mg  25 mg Oral QHS Zouev, Dmitri, MD   25 mg at 08/09/23 2123   haloperidol  (HALDOL ) tablet 5 mg  5 mg Oral TID PRN White, Patrice L, NP       And   diphenhydrAMINE  (BENADRYL ) capsule 50 mg  50 mg Oral TID PRN White, Patrice L, NP       haloperidol  lactate (HALDOL ) injection 5 mg  5 mg Intramuscular TID PRN White, Patrice L, NP       And   diphenhydrAMINE  (BENADRYL ) injection 50 mg  50 mg Intramuscular TID PRN White, Patrice L, NP       And   LORazepam  (ATIVAN ) injection 2 mg  2 mg Intramuscular TID PRN White, Patrice L, NP       haloperidol  lactate (HALDOL ) injection 10 mg  10 mg Intramuscular TID PRN White, Patrice L, NP       And   diphenhydrAMINE  (BENADRYL ) injection 50 mg  50 mg Intramuscular TID PRN White, Patrice L, NP       And   LORazepam  (ATIVAN ) injection 2 mg  2 mg Intramuscular TID PRN White, Patrice L, NP       docusate sodium  (COLACE) capsule 100 mg  100 mg Oral QHS Zouev, Dmitri, MD   100 mg at 08/09/23 2123   hydrOXYzine  (ATARAX ) tablet 25 mg  25 mg Oral TID PRN White, Patrice L, NP       lithium  carbonate  (LITHOBID ) ER tablet 600 mg  600 mg Oral QHS Zouev, Dmitri, MD   600 mg at 08/09/23 2123   magnesium  hydroxide (MILK OF MAGNESIA) suspension 30 mL  30 mL Oral Daily PRN White, Patrice L, NP       polyethylene glycol (MIRALAX  / GLYCOLAX ) packet 17 g  17 g Oral Daily Zouev, Dmitri, MD   17 g at 08/09/23 1715   traZODone  (DESYREL ) tablet 50 mg  50 mg Oral QHS PRN White, Patrice L, NP   50 mg at 08/09/23 2123   Current Outpatient Medications  Medication Sig Dispense Refill   cloZAPine  (CLOZARIL ) 100 MG tablet Take 1 tablet (100 mg total) by mouth at bedtime. 30 tablet 0   lithium  carbonate (LITHOBID ) 300 MG ER tablet Take 2 tablets (600 mg total) by mouth at bedtime. 60 tablet 0   senna (SENOKOT) 8.6 MG TABS tablet Take 2 tablets (17.2 mg total) by mouth at bedtime. (Patient not taking: Reported on 08/08/2023) 120 tablet 0    PTA Medications:  Facility Ordered  Medications  Medication   [COMPLETED] potassium chloride  SA (KLOR-CON  M) CR tablet 40 mEq   acetaminophen  (TYLENOL ) tablet 650 mg   alum & mag hydroxide-simeth (MAALOX/MYLANTA) 200-200-20 MG/5ML suspension 30 mL   magnesium  hydroxide (MILK OF MAGNESIA) suspension 30 mL   haloperidol  (HALDOL ) tablet 5 mg   And   diphenhydrAMINE  (BENADRYL ) capsule 50 mg   haloperidol  lactate (HALDOL ) injection 5 mg   And   diphenhydrAMINE  (BENADRYL ) injection 50 mg   And   LORazepam  (ATIVAN ) injection 2 mg   haloperidol  lactate (HALDOL ) injection 10 mg   And   diphenhydrAMINE  (BENADRYL ) injection 50 mg   And   LORazepam  (ATIVAN ) injection 2 mg   hydrOXYzine  (ATARAX ) tablet 25 mg   traZODone  (DESYREL ) tablet 50 mg   cloZAPine  (CLOZARIL ) tablet 25 mg   lithium  carbonate (LITHOBID ) ER tablet 600 mg   polyethylene glycol (MIRALAX  / GLYCOLAX ) packet 17 g   docusate sodium  (COLACE) capsule 100 mg   [COMPLETED] potassium chloride  SA (KLOR-CON  M) CR tablet 40 mEq   PTA Medications  Medication Sig   senna (SENOKOT) 8.6 MG TABS tablet Take 2 tablets  (17.2 mg total) by mouth at bedtime. (Patient not taking: Reported on 08/08/2023)   cloZAPine  (CLOZARIL ) 100 MG tablet Take 1 tablet (100 mg total) by mouth at bedtime.   lithium  carbonate (LITHOBID ) 300 MG ER tablet Take 2 tablets (600 mg total) by mouth at bedtime.       08/10/2023    3:03 PM  Depression screen PHQ 2/9  Decreased Interest 1  Down, Depressed, Hopeless 1  PHQ - 2 Score 2  Altered sleeping 0  Tired, decreased energy 1  Change in appetite 0  Feeling bad or failure about yourself  1  Trouble concentrating 1  Moving slowly or fidgety/restless 0  Suicidal thoughts 0  PHQ-9 Score 5    Flowsheet Row ED from 08/08/2023 in [facility] Emergency Department at [facility] ED from 08/07/2023 in [facility] Emergency Department at [facility] ED from 08/05/2023 in [facility] Emergency Department at [facility]  C-SSRS RISK CATEGORY No Risk No Risk Error: Q3, 4, or 5 should not be populated when Q2 is No    Musculoskeletal  Strength & Muscle Tone: within normal limits Gait & Station: normal Patient leans: N/A  Psychiatric Specialty Exam  Presentation  General Appearance:  Disheveled  Eye Contact: Fleeting  Speech: Other (comment) (intelligible short phrase [place])  Speech Volume: Normal  Handedness: Right   Mood and Affect  Mood: Euthymic; Anxious; Irritable  Affect: Congruent   Thought Process  Thought Processes: Goal Directed  Descriptions of Associations:Intact  Orientation:Full (Time, Place and Person)  Thought Content:Logical  Diagnosis of Schizophrenia or Schizoaffective disorder in past: Yes  Duration of Psychotic Symptoms: Less than six months   Hallucinations:Hallucinations: Auditory  Ideas of Reference:Delusions  Suicidal Thoughts:Suicidal Thoughts: No  Homicidal Thoughts:Homicidal Thoughts: No   Sensorium  Memory: Immediate Fair; Recent Poor; Remote Poor  Judgment: Fair  Insight: Fair   Forensic psychologist: Fair  Attention Span: Fair  Recall: Fair  Fund of Knowledge: Fair  Language: Fair   Psychomotor Activity  Psychomotor Activity: Psychomotor Activity: Normal   Assets  Assets: Manufacturing systems engineer; Desire for Improvement; Housing; Leisure Time   Sleep  Sleep: Sleep: Good  Estimated Sleeping Duration (Last 24 Hours): 9.25-10.50 hours  No data recorded  Physical Exam  Physical Exam ROS Blood pressure (!) 147/76, pulse 73, temperature 97.9 F (36.6 C), temperature  source Oral, resp. rate 17, SpO2 95%. There is no height or weight on file to calculate BMI.  Demographic Factors:  Male  Loss Factors: NA  Historical Factors: Prior suicide attempts  Risk Reduction Factors:   Living with another person, especially a relative  Continued Clinical Symptoms:  Alcohol/Substance Abuse/Dependencies  Cognitive Features That Contribute To Risk:  None    Suicide Risk:  Minimal: No identifiable suicidal ideation.  Patients presenting with no risk factors but with morbid ruminations; may be classified as minimal risk based on the severity of the depressive symptoms  Plan Of Care/Follow-up recommendations:   # It is recommended to the patient to continue psychiatric and somatic medications as prescribed, after discharge.    # It is recommended to the patient to follow up with your outpatient psychiatric provider and PCP. # Testing: Follow-up with outpatient provider for abnormal lab results: particularly supplementation for B12/folate deficiency. # It was discussed with the patient, the impact of alcohol, drugs, tobacco have been there overall psychiatric and medical wellbeing, and total abstinence from substance use was recommended.  # Prescriptions sent directly to [facility] in [place]. Patient agreeable to plan, although he continues to claim medication is at home. Given opportunity to ask questions. Appears to feel comfortable with discharge.   #Activity: Daily physical activity; reduce sedentary behaviors.  #Diet: Portion-limited diet rich in produce, whole (minimally processed) grains, nuts/seeds, beans/legumes, seafood, eggs, fermented food, lowfat dairy (if tolerated), lean meat. Copious water intake. Limit (ultra)processed foods and sugar-sweetened beverages.  # In the event of worsening symptoms, the patient is instructed to call your outpatient psychiatric provider, [phone] or go to the nearest emergency department for appropriate evaluation and treatment of symptoms.   Disposition: Discharge to self care, transportation voucher, return to live with parent with scheduled f/u with ACT Team/[facility].  KANDI JAYSON HAHN, MD 08/10/2023, 3:08 PM

## 2023-08-13 ENCOUNTER — Emergency Department

## 2023-08-13 ENCOUNTER — Emergency Department
Admission: EM | Admit: 2023-08-13 | Discharge: 2023-08-13 | Disposition: A | Discharge status: Home / Self Care | Attending: Emergency Medicine | Admitting: Emergency Medicine

## 2023-08-13 ENCOUNTER — Other Ambulatory Visit: Payer: Self-pay

## 2023-08-13 ENCOUNTER — Emergency Department (EMERGENCY_DEPARTMENT_HOSPITAL)
Admission: EM | Admit: 2023-08-13 | Discharge: 2023-08-17 | Disposition: A | Discharge status: Home / Self Care | Source: Home / Self Care | Attending: Emergency Medicine | Admitting: Emergency Medicine

## 2023-08-13 DIAGNOSIS — M25531 Pain in right wrist: Secondary | ICD-10-CM | POA: Diagnosis present

## 2023-08-13 DIAGNOSIS — Z79899 Other long term (current) drug therapy: Secondary | ICD-10-CM | POA: Diagnosis not present

## 2023-08-13 DIAGNOSIS — S0990XA Unspecified injury of head, initial encounter: Secondary | ICD-10-CM

## 2023-08-13 DIAGNOSIS — W1830XA Fall on same level, unspecified, initial encounter: Secondary | ICD-10-CM | POA: Diagnosis not present

## 2023-08-13 DIAGNOSIS — W2209XA Striking against other stationary object, initial encounter: Secondary | ICD-10-CM | POA: Diagnosis not present

## 2023-08-13 DIAGNOSIS — R454 Irritability and anger: Secondary | ICD-10-CM | POA: Diagnosis not present

## 2023-08-13 DIAGNOSIS — R451 Restlessness and agitation: Secondary | ICD-10-CM | POA: Diagnosis not present

## 2023-08-13 DIAGNOSIS — F250 Schizoaffective disorder, bipolar type: Secondary | ICD-10-CM | POA: Insufficient documentation

## 2023-08-13 DIAGNOSIS — S0001XA Abrasion of scalp, initial encounter: Secondary | ICD-10-CM | POA: Insufficient documentation

## 2023-08-13 LAB — CBC WITH DIFFERENTIAL/PLATELET
Abs Immature Granulocytes: 0.03 K/uL (ref 0.00–0.07)
Basophils Absolute: 0 K/uL (ref 0.0–0.1)
Basophils Relative: 0 %
Eosinophils Absolute: 0 K/uL (ref 0.0–0.5)
Eosinophils Relative: 0 %
HCT: 36.7 % — ABNORMAL LOW (ref 39.0–52.0)
Hemoglobin: 12.5 g/dL — ABNORMAL LOW (ref 13.0–17.0)
Immature Granulocytes: 0 %
Lymphocytes Relative: 24 %
Lymphs Abs: 1.8 K/uL (ref 0.7–4.0)
MCH: 29.8 pg (ref 26.0–34.0)
MCHC: 34.1 g/dL (ref 30.0–36.0)
MCV: 87.6 fL (ref 80.0–100.0)
Monocytes Absolute: 0.7 K/uL (ref 0.1–1.0)
Monocytes Relative: 9 %
Neutro Abs: 5.2 K/uL (ref 1.7–7.7)
Neutrophils Relative %: 67 %
Platelets: 302 K/uL (ref 150–400)
RBC: 4.19 MIL/uL — ABNORMAL LOW (ref 4.22–5.81)
RDW: 13.6 % (ref 11.5–15.5)
WBC: 7.7 K/uL (ref 4.0–10.5)
nRBC: 0 % (ref 0.0–0.2)

## 2023-08-13 LAB — COMPREHENSIVE METABOLIC PANEL WITH GFR
ALT: 25 U/L (ref 0–44)
AST: 18 U/L (ref 15–41)
Albumin: 3.8 g/dL (ref 3.5–5.0)
Alkaline Phosphatase: 71 U/L (ref 38–126)
Anion gap: 11 (ref 5–15)
BUN: 20 mg/dL (ref 6–20)
CO2: 24 mmol/L (ref 22–32)
Calcium: 9 mg/dL (ref 8.9–10.3)
Chloride: 104 mmol/L (ref 98–111)
Creatinine, Ser: 0.88 mg/dL (ref 0.61–1.24)
GFR, Estimated: >60 mL/min (ref >60)
Glucose, Bld: 94 mg/dL (ref 70–99)
Potassium: 3.4 mmol/L — ABNORMAL LOW (ref 3.5–5.1)
Sodium: 139 mmol/L (ref 135–145)
Total Bilirubin: 0.6 mg/dL (ref 0.0–1.2)
Total Protein: 7 g/dL (ref 6.5–8.1)

## 2023-08-13 LAB — LITHIUM LEVEL: Lithium Lvl: <0.06 mmol/L — ABNORMAL LOW (ref 0.60–1.20)

## 2023-08-13 LAB — SALICYLATE LEVEL: Salicylate Lvl: <7 mg/dL — ABNORMAL LOW (ref 7.0–30.0)

## 2023-08-13 LAB — ETHANOL: Alcohol, Ethyl (B): <15 mg/dL (ref <15)

## 2023-08-13 LAB — ACETAMINOPHEN LEVEL: Acetaminophen (Tylenol), Serum: <10 ug/mL — ABNORMAL LOW (ref 10–30)

## 2023-08-13 MED ORDER — HALOPERIDOL LACTATE 5 MG/ML IJ SOLN
5.0000 mg | Freq: Once | INTRAMUSCULAR | Status: AC
  Administered 2023-08-13: 5 mg via INTRAMUSCULAR
  Filled 2023-08-13: qty 1

## 2023-08-13 MED ORDER — MIDAZOLAM HCL 2 MG/2ML IJ SOLN
2.0000 mg | Freq: Once | INTRAMUSCULAR | Status: AC
  Administered 2023-08-13: 2 mg via INTRAMUSCULAR
  Filled 2023-08-13: qty 2

## 2023-08-13 NOTE — ED Triage Notes (Signed)
 Pt presents via EMS c/o right wrist pain s/p fall per EMS report.

## 2023-08-13 NOTE — BH Assessment (Signed)
 Psych Team unable to complete consult at this time. Patient was giving IM medication and can not participate in the interview.

## 2023-08-13 NOTE — ED Provider Notes (Signed)
.-----------------------------------------   6:57 AM on 08/13/2023 -----------------------------------------  Blood pressure 100/62, pulse 69, temperature 97.9 F (36.6 C), temperature source Oral, resp. rate 18, SpO2 99%.  Assuming care from Dr. Neomi.  In short, [name] is a 33 y.o. male with a chief complaint of Wrist Pain .  Refer to the original H&P for additional details.  The current plan of care is to XR, dc after, does not want to see psych today, no SI/HI.  X-rays negative for acute fracture or dislocation, on reassessment patient is calm, discussed with him about imaging results, considered but no indication for inpatient admission at this time, he is due for outpatient management.  Put in referral for him for primary care.  He can take Tylenol  or ibuprofen as needed for the pain.  Discharged with strict return precautions.  Clinical Course as of 08/13/23 9272  Fri Aug 13, 2023  0726 DG Wrist Complete Right No acute fracture or dislocation identified about the right wrist.  [TT]    Clinical Course User Index [TT] Waymond Lorelle Cummins, MD      Waymond Lorelle Cummins, MD 08/13/23 [phone]

## 2023-08-13 NOTE — ED Provider Notes (Signed)
 [facility] Provider Note    Event Date/Time   First MD Initiated Contact with Patient 08/13/23 [phone]     (approximate)   History   Head Injury   HPI  [name] is a 33 y.o. male past medical history significant for schizophrenia paranoid type versus schizoaffective disorder, presents to the emergency department with agitation.  Patient presented to the emergency department and was evaluated overnight for wrist injury and bizarre behavior.  Patient has had multiple evaluations by psychiatry recently and has been positive in his UDS for cocaine and cannabis.  Patient was cleared after x-rays were negative in the emergency department and was discharged to the waiting room.  PD was called given that the patient was in medical mall and they attempted to assist him off the property.  Patient became agitated and started banging his head against the cop vehicle.  Arrived in handcuffs, yelling and trying to throw himself off the stretcher.     Physical Exam   Triage Vital Signs: ED Triage Vitals  Encounter Vitals Group     BP      Girls Systolic BP Percentile      Girls Diastolic BP Percentile      Boys Systolic BP Percentile      Boys Diastolic BP Percentile      Pulse      Resp      Temp      Temp src      SpO2      Weight      Height      Head Circumference      Peak Flow      Pain Score      Pain Loc      Pain Education      Exclude from Growth Chart     Most recent vital signs: Vitals:   08/13/23 1006  BP: 106/74  Pulse: 82  Resp: 19  Temp: 97.7 F (36.5 C)  SpO2: 95%    Physical Exam Constitutional:      Appearance: He is well-developed.  HENT:     Head:     Comments: Abrasion to parietal scalp.  No obvious active bleeding.  No obvious laceration. Eyes:     Conjunctiva/sclera: Conjunctivae normal.     Pupils: Pupils are equal, round, and reactive to light.  Cardiovascular:     Rate and Rhythm: Regular rhythm.  Pulmonary:      Effort: No respiratory distress.  Abdominal:     Tenderness: There is no abdominal tenderness.  Musculoskeletal:        General: Normal range of motion.     Cervical back: Normal range of motion. No tenderness.     Right lower leg: No edema.     Left lower leg: No edema.  Skin:    General: Skin is warm.     Capillary Refill: Capillary refill takes less than 2 seconds.  Neurological:     General: No focal deficit present.     Mental Status: He is alert. Mental status is at baseline.  Psychiatric:        Mood and Affect: Affect is angry and inappropriate.        Behavior: Behavior is agitated and aggressive.        Thought Content: Thought content does not include homicidal or suicidal ideation.     IMPRESSION / MDM / ASSESSMENT AND PLAN / ED COURSE  I reviewed the triage vital signs and  the nursing notes.  Differential diagnosis including intoxication, acute psychosis, intracranial hemorrhage  Attempted to verbally de-escalate however patient continued to be combative and trying to hit his head against the wall.  Unable to verbally de-escalate or give oral medications so given IM Versed  and Haldol .  Will add on lithium  level given prior prescription   LABS (all labs ordered are listed, but only abnormal results are displayed) Labs interpreted as -    Labs Reviewed  COMPREHENSIVE METABOLIC PANEL WITH GFR - Abnormal; Notable for the following components:      Result Value   Potassium 3.4 (*)    All other components within normal limits  CBC WITH DIFFERENTIAL/PLATELET - Abnormal; Notable for the following components:   RBC 4.19 (*)    Hemoglobin 12.5 (*)    HCT 36.7 (*)    All other components within normal limits  SALICYLATE LEVEL - Abnormal; Notable for the following components:   Salicylate Lvl <7.0 (*)    All other components within normal limits  ACETAMINOPHEN  LEVEL - Abnormal; Notable for the following components:   Acetaminophen  (Tylenol ), Serum <10 (*)     All other components within normal limits  ETHANOL  URINE DRUG SCREEN, QUALITATIVE ([facility] ONLY)  LITHIUM  LEVEL    MDM    CT scan of the head and cervical spine with no acute fracture or traumatic injury.  Moving all extremities have a low suspicion for ligamentous injury.  X-ray of the left hand with no acute fracture or dislocation.  Patient required IM medications given significant agitation.  Lab work overall reassuring with no significant electrolyte abnormality.  No signs of a coingestion.  Will consult psychiatry and social work for further evaluation   The patient has been placed in psychiatric observation due to the need to provide a safe environment for the patient while obtaining psychiatric consultation and evaluation, as well as ongoing medical and medication management to treat the patient's condition.  The patient has not been placed under full IVC at this time.   PROCEDURES:  Critical Care performed: No  Procedures  Patient's presentation is most consistent with acute presentation with potential threat to life or bodily function.   MEDICATIONS ORDERED IN ED: Medications  haloperidol  lactate (HALDOL ) injection 5 mg (5 mg Intramuscular Given 08/13/23 0952)  midazolam  (VERSED ) injection 2 mg (2 mg Intramuscular Given 08/13/23 9047)    FINAL CLINICAL IMPRESSION(S) / ED DIAGNOSES   Final diagnoses:  Injury of head, initial encounter  Agitation     Rx / DC Orders   ED Discharge Orders     None        Note:  This document was prepared using Dragon voice recognition software and may include unintentional dictation errors.   Suzanne Kirsch, MD 08/13/23 1157

## 2023-08-13 NOTE — ED Notes (Signed)
 Pt slammed head on wall. This Rn gave pt pillow to lay head down and advised not to slam head.

## 2023-08-13 NOTE — ED Provider Notes (Signed)
 [facility] Provider Note    Event Date/Time   First MD Initiated Contact with Patient 08/13/23 [phone]     (approximate)   History   Wrist Pain   HPI  [name] is a 33 y.o. male with history of schizophrenia versus schizoaffective disorder, cocaine abuse who presents to the emergency department after he reports falling in a parking lot just prior to arrival.  Brought in by EMS.  Patient states that he was running too fast when he fell.  States he injured his right wrist.  Denies hitting his head, losing consciousness.  He is not on any blood thinners.  Patient has odd affect which is his baseline.  He does not appear to be responding to internal stimuli.  He is calm, cooperative, laughing.  He denies SI, HI or hallucinations.  He states that he does not feel he needs psychiatric evaluation.  Has an appointment with his ACT team on August 1 at 11 AM.  Patient just discharged by psychiatry 08/10/2023.   History provided by patient.    Past Medical History:  Diagnosis Date   B12 deficiency 05/27/2023   Folate deficiency 05/27/2023   Schizo affective schizophrenia (HCC)    Schizophrenia (HCC)     History reviewed. No pertinent surgical history.  MEDICATIONS:  Prior to Admission medications   Medication Sig Start Date End Date Taking? Authorizing Provider  cloZAPine  (CLOZARIL ) 100 MG tablet Take 1 tablet (100 mg total) by mouth at bedtime. 08/10/23   Cole Kandi BROCKS, MD  lithium  carbonate (LITHOBID ) 300 MG ER tablet Take 2 tablets (600 mg total) by mouth at bedtime. 08/10/23   Cole Kandi BROCKS, MD  senna (SENOKOT) 8.6 MG TABS tablet Take 2 tablets (17.2 mg total) by mouth at bedtime. Patient not taking: Reported on 08/08/2023 06/05/23   Hinda Jerrell LABOR, MD    Physical Exam   Triage Vital Signs: ED Triage Vitals [08/13/23 0636]  Encounter Vitals Group     BP 100/62     Girls Systolic BP Percentile      Girls Diastolic BP Percentile       Boys Systolic BP Percentile      Boys Diastolic BP Percentile      Pulse Rate 69     Resp 18     Temp 97.9 F (36.6 C)     Temp Source Oral     SpO2 99 %     Weight      Height      Head Circumference      Peak Flow      Pain Score 4     Pain Loc      Pain Education      Exclude from Growth Chart     Most recent vital signs: Vitals:   08/13/23 0636  BP: 100/62  Pulse: 69  Resp: 18  Temp: 97.9 F (36.6 C)  SpO2: 99%     CONSTITUTIONAL: Alert, responds appropriately to questions. Well-appearing; well-nourished; GCS 15, no distress HEAD: Normocephalic; atraumatic EYES: Conjunctivae clear, PERRL, EOMI ENT: normal nose; no rhinorrhea; moist mucous membranes; pharynx without lesions noted; no dental injury; no septal hematoma, no epistaxis; no facial deformity or bony tenderness NECK: Supple, no midline spinal tenderness, step-off or deformity; trachea midline CARD: RRR; S1 and S2 appreciated; no murmurs, no clicks, no rubs, no gallops RESP: Normal chest excursion without splinting or tachypnea; breath sounds clear and equal bilaterally; no wheezes, no rhonchi, no rales; no  hypoxia or respiratory distress CHEST:  chest wall stable, no crepitus or ecchymosis or deformity, nontender to palpation; no flail chest ABD/GI: Non-distended; soft, non-tender, no rebound, no guarding; no ecchymosis or other lesions noted PELVIS:  stable, nontender to palpation BACK:  The back appears normal; no midline spinal tenderness, step-off or deformity EXT: Mildly tender over the right wrist without deformity.  2+ right radial pulse.  Normal ROM in all joints; no edema; normal capillary refill; no cyanosis, no bony tenderness or bony deformity of patient's extremities, no joint effusions, compartments are soft, extremities are warm and well-perfused, no ecchymosis SKIN: Normal color for age and race; warm NEURO: No facial asymmetry, normal speech, moving all extremities equally PSYCH: Patient has  odd affect.  Calm, cooperative.  Laughing.  Not responding to internal stimuli currently.  Denies SI, HI.  ED Results / Procedures / Treatments   LABS: (all labs ordered are listed, but only abnormal results are displayed) Labs Reviewed - No data to display   EKG:    RADIOLOGY: My personal review and interpretation of imaging:    I have personally reviewed all radiology reports. No results found.   PROCEDURES:  Critical Care performed: No    Procedures    IMPRESSION / MDM / ASSESSMENT AND PLAN / ED COURSE  I reviewed the triage vital signs and the nursing notes.  Patient here after a reported mechanical fall with right wrist pain.    DIFFERENTIAL DIAGNOSIS (includes but not limited to):   Wrist sprain, fracture, malingering, schizophrenia, no obvious sign of acute psychosis  Patient's presentation is most consistent with acute complicated illness / injury requiring diagnostic workup.  PLAN: Will obtain x-rays of the right wrist.  No other injury on exam.  Neurovascular intact distally.  Patient has bizarre affect but this is his baseline.  He has been seen numerous times in our emergency department with 17 visits in the past 6 months.  He does not appear to be having an acute psychiatric issue at this time but will continue to closely monitor.  He declines anything for pain.   MEDICATIONS GIVEN IN ED: Medications - No data to display   ED COURSE:  Xray pending.  Anticipate dc home after results back.  I do not feel there is any emergent psychiatric need at this time but will continue to monitor while here in the ED.  Signed out to oncoming EDP.   CONSULTS:  none   OUTSIDE RECORDS REVIEWED: Reviewed most recent psychiatric notes.       FINAL CLINICAL IMPRESSION(S) / ED DIAGNOSES   Final diagnoses:  Acute pain of right wrist     Rx / DC Orders   ED Discharge Orders     None        Note:  This document was prepared using Dragon voice  recognition software and may include unintentional dictation errors.   Gianfranco Araki, Josette SAILOR, DO 08/13/23 [phone]

## 2023-08-13 NOTE — Discharge Instructions (Signed)
 You may alternate over the counter Tylenol 1000 mg every 6 hours as needed for pain, fever and Ibuprofen 800 mg every 6-8 hours as needed for pain, fever.  Please take Ibuprofen with food.  Do not take more than 4000 mg of Tylenol (acetaminophen) in a 24 hour period.

## 2023-08-13 NOTE — ED Notes (Signed)
 [facility] rolled pt through nurses station on stretcher, pt hit wall landing fist on metal frame of medical gas alarm system.  Police in attendance, pt placed in cuffs by police at that time.

## 2023-08-13 NOTE — ED Triage Notes (Signed)
 Pt [facility]  [place] [employer]. Pt was discharged from hospital today and went to the hotel in which police were called to remove pt. Pt slammed head on [employer] vehicle multiple times. Blood on top of head and L side of face upon assessment. Pt is tearful, agitated and screaming upon arrival.

## 2023-08-13 NOTE — ED Notes (Signed)
 Pt transported for xrays at this time.

## 2023-08-13 NOTE — ED Notes (Signed)
 VOL/pending psych consult

## 2023-08-13 NOTE — ED Provider Notes (Signed)
 Vitals:   08/13/23 1006 08/13/23 2229  BP: 106/74 (!) 91/57  Pulse: 82 68  Resp: 19 15  Temp: 97.7 F (36.5 C) 97.6 F (36.4 C)  SpO2: 95% 98%     Patient's awaiting psychiatric assessment.  Earlier attempt by psychiatry team to evaluate the patient was unsuccessful  Prior EDP note states not under IVC, I also do not see IVC on chart at this time. Awaiting psych assessment   Presently on reassessment the patient is sleeping with even unlabored respirations without distress.   Dicky Anes, MD 08/13/23 [phone]

## 2023-08-14 DIAGNOSIS — F250 Schizoaffective disorder, bipolar type: Secondary | ICD-10-CM

## 2023-08-14 MED ORDER — LITHIUM CARBONATE ER 450 MG PO TBCR
900.0000 mg | EXTENDED_RELEASE_TABLET | Freq: Two times a day (BID) | ORAL | Status: DC
  Administered 2023-08-14 – 2023-08-17 (×8): 900 mg via ORAL
  Filled 2023-08-14 (×9): qty 2

## 2023-08-14 MED ORDER — HYDROXYZINE HCL 25 MG PO TABS: 25.0000 mg | ORAL_TABLET | Freq: Three times a day (TID) | ORAL | Status: DC | PRN

## 2023-08-14 MED ORDER — ACETAMINOPHEN 500 MG PO TABS
1000.0000 mg | ORAL_TABLET | Freq: Three times a day (TID) | ORAL | Status: DC | PRN
  Administered 2023-08-14 – 2023-08-17 (×2): 1000 mg via ORAL
  Filled 2023-08-14: qty 2

## 2023-08-14 MED ORDER — CLOZAPINE 100 MG PO TABS
100.0000 mg | ORAL_TABLET | Freq: Every day | ORAL | Status: DC
  Administered 2023-08-14 – 2023-08-16 (×4): 100 mg via ORAL
  Filled 2023-08-14 (×4): qty 1

## 2023-08-14 MED ORDER — TRAZODONE HCL 50 MG PO TABS
50.0000 mg | ORAL_TABLET | Freq: Every day | ORAL | Status: DC
  Administered 2023-08-14 – 2023-08-16 (×4): 50 mg via ORAL
  Filled 2023-08-14 (×4): qty 1

## 2023-08-14 NOTE — Consult Note (Signed)
 [facility] Consult Note  Patient Name: [name] MRN: [id number] DOB: [birth year] DATE OF Consult: 08/14/2023  PRIMARY PSYCHIATRIC DIAGNOSES   1.  Schizoaffective Disorder, Bipolar Type   RECOMMENDATIONS  Recommendations: Medication recommendations: Clozaril , 100 mg at bedtime for psychosis; Lithium  ER, 900 mg at bedtime for mood control; trazodone  50 mg at bedtime PRN anxiety/depression/sleep.  PRN's:  hydroxyzine , 25 mg tid PRN anxiety Non-Medication/therapeutic recommendations: Patient still having command hallucinations, and he will need close observation until he can be safely admitted in Psychiatry.  Continue matter of fact emotional support in ED, pending transfer Is inpatient psychiatric hospitalization recommended for this patient? Yes (Explain why): Patient has taken no medication since d/c from hospital.  Therefore, command hallucinations for suicide are persisting, with history of suicide attempt jumping off a bridge in [place], breaking his back.  Patient willing to come in voluntarily Is another care setting recommended for this patient? (examples may include Crisis Stabilization Unit, Residential/Recovery Treatment, ALF/SNF, Memory Care Unit)  No (Explain why): As above From a psychiatric perspective, is this patient appropriate for discharge to an outpatient setting/resource or other less restrictive environment for continued care?  No (Explain why): Dorethia guthrie Follow-Up Telepsychiatry C/L services: We will sign off for now. Please re-consult our service if needed for any concerning changes in the patient's condition, discharge planning, or questions. Communication: Treatment team members (and family members if applicable) who were involved in treatment/care discussions and planning, and with whom we spoke or engaged with via secure text/chat, include the following: Secure message sent to Dr. Dicky, ED attending, and staff, outlining above recommendations.  Thank you for  involving us  in the care of this patient. If you have any additional questions or concerns, please call [phone] and ask for the provider on-call.  TELEPSYCHIATRY ATTESTATION & CONSENT   As the provider for this telehealth consult, I attest that I verified the patient's identity using two separate identifiers, introduced myself to the patient, provided my credentials, disclosed my location, and performed this encounter via a HIPAA-compliant, real-time, face-to-face, two-way, interactive audio and video platform and with the full consent and agreement of the patient (or guardian as applicable.)   Patient physical location: [facility] ED. Telehealth provider physical location: home office in state of [place] .  Video start time: 0310h EDT  Video end time: 0325h EDT  Total time spent in this encounter was 30 minutes, including record review, clinical interview, behavior observations, discussion of impressions and recommendations (including medications and hospitalization), and consultation/communication with relevant parties   IDENTIFYING DATA  [name] is a 33 y.o. year-old male for whom a psychiatric consultation has been ordered by the primary provider. The patient was identified using two separate identifiers.  CHIEF COMPLAINT/REASON FOR CONSULT   These voices are terrible.  I just want to die   HISTORY OF PRESENT ILLNESS (HPI)  The patient presents with long history of schizoaffective disorder, with multiple hospitalizations and suicide attempts.  Was just discharged from the Psych service about ten days ago.  He did not, however, follow up with providers nor use his prescribed lithium  and Clozaril .  As a result, he again exacerbated into hearing command hallucinations to tell him to kill himself.  Began to feel that could not be safe if not in hospital.  BAL OK.  UDS pending.  No homicidal ideation.   SABRA  PAST PSYCHIATRIC HISTORY   Otherwise as per HPI above.  PAST  MEDICAL HISTORY  Past Medical History:  Diagnosis  Date   B12 deficiency 05/27/2023   Folate deficiency 05/27/2023   Schizo affective schizophrenia (HCC)    Schizophrenia [facility])      HOME MEDICATIONS  Facility Ordered Medications  Medication   [COMPLETED] haloperidol  lactate (HALDOL ) injection 5 mg   [COMPLETED] midazolam  (VERSED ) injection 2 mg   cloZAPine  (CLOZARIL ) tablet 100 mg   lithium  carbonate (ESKALITH ) ER tablet 900 mg   hydrOXYzine  (ATARAX ) tablet 25 mg   traZODone  (DESYREL ) tablet 50 mg   PTA Medications  Medication Sig   cloZAPine  (CLOZARIL ) 100 MG tablet Take 1 tablet (100 mg total) by mouth at bedtime.   lithium  carbonate (LITHOBID ) 300 MG ER tablet Take 2 tablets (600 mg total) by mouth at bedtime.   senna (SENOKOT) 8.6 MG TABS tablet Take 2 tablets (17.2 mg total) by mouth at bedtime. (Patient not taking: Reported on 08/08/2023)     ALLERGIES  Allergies  Allergen Reactions   Ibuprofen Swelling    Tongue swelling   Risperidone And Paliperidone Other (See Comments) and Swelling    gynecomastia gynecomastia    Ziprasidone  Swelling    Tongue swells   Benztropine  Other (See Comments)    Causes confusion, depression, and delusions   Quetiapine  Other (See Comments)    Depression, suicidality, adverse effect: seizures     SOCIAL & SUBSTANCE USE HISTORY  Social History   Socioeconomic History   Marital status: Single    Spouse name: Not on file   Number of children: Not on file   Years of education: Not on file   Highest education level: Not on file  Occupational History   Not on file  Tobacco Use   Smoking status: Never   Smokeless tobacco: Never  Substance and Sexual Activity   Alcohol use: Not Currently   Drug use: Yes    Types: Cocaine, Marijuana    Comment: crack 06-26-19   Sexual activity: Yes  Other Topics Concern   Not on file  Social History Narrative   Not on file   Social Drivers of Health   Financial Resource Strain: Not on file   Food Insecurity: No Food Insecurity (08/08/2023)   Hunger Vital Sign    Worried About Running Out of Food in the Last Year: Never true    Ran Out of Food in the Last Year: Never true  Recent Concern: Food Insecurity - Food Insecurity Present (07/27/2023)   Hunger Vital Sign    Worried About Running Out of Food in the Last Year: Sometimes true    Ran Out of Food in the Last Year: Sometimes true  Transportation Needs: No Transportation Needs (08/08/2023)   PRAPARE - Administrator, Civil Service (Medical): No    Lack of Transportation (Non-Medical): No  Recent Concern: Transportation Needs - Unmet Transportation Needs (07/27/2023)   PRAPARE - Administrator, Civil Service (Medical): Yes    Lack of Transportation (Non-Medical): Yes  Physical Activity: Not on file  Stress: Not on file  Social Connections: Unknown (07/27/2023)   Social Connection and Isolation Panel    Frequency of Communication with Friends and Family: Not on file    Frequency of Social Gatherings with Friends and Family: Not on file    Attends Religious Services: Not on file    Active Member of Clubs or Organizations: Not on file    Attends Banker Meetings: Not on file    Marital Status: Never married   Social History   Tobacco Use  Smoking Status Never  Smokeless Tobacco Never   Social History   Substance and Sexual Activity  Alcohol Use Not Currently   Social History   Substance and Sexual Activity  Drug Use Yes   Types: Cocaine, Marijuana   Comment: crack 06-26-19    Additional pertinent information Lives with mother and sister, but now feels suspicious of them.  FAMILY HISTORY  No family history on file. Family Psychiatric History (if known):  Not reported  MENTAL STATUS EXAM (MSE)  Mental Status Exam: General Appearance: Fairly Groomed  Orientation:  Full (Time, Place, and Person)  Memory:  Immediate;   Fair Recent;   Fair Remote;   Fair  Concentration:   Concentration: Poor and Attention Span: Poor  Recall:  Fair  Attention  Poor  Eye Contact:  Minimal  Speech:  Slow  Language:  Fair  Volume:  Decreased  Mood: I can't take the voices   Affect:  Depressed  Thought Process:  Disorganized and Descriptions of Associations: Tangential  Thought Content:  Hallucinations: Command:  auditory, for suicide  Suicidal Thoughts:  Yes.  with intent/plan  Homicidal Thoughts:  No  Judgement:  Impaired  Insight:  Lacking  Psychomotor Activity:  Decreased  Akathisia:  Negative  Fund of Knowledge:  Fair    Assets:  Manufacturing systems engineer Resilience  Cognition:  WNL  ADL's:  Intact  AIMS (if indicated):       VITALS  Blood pressure (!) 94/58, pulse 68, temperature 97.6 F (36.4 C), temperature source Oral, resp. rate 15, height 5' 11 (1.803 m), weight 76.7 kg, SpO2 98%.  LABS  Admission on 08/13/2023  Component Date Value Ref Range Status   Sodium 08/13/2023 139  135 - 145 mmol/L Final   Potassium 08/13/2023 3.4 (L)  3.5 - 5.1 mmol/L Final   Chloride 08/13/2023 104  98 - 111 mmol/L Final   CO2 08/13/2023 24  22 - 32 mmol/L Final   Glucose, Bld 08/13/2023 94  70 - 99 mg/dL Final   Glucose reference range applies only to samples taken after fasting for at least 8 hours.   BUN 08/13/2023 20  6 - 20 mg/dL Final   Creatinine, Ser 08/13/2023 0.88  0.61 - 1.24 mg/dL Final   Calcium 91/98/7974 9.0  8.9 - 10.3 mg/dL Final   Total Protein 91/98/7974 7.0  6.5 - 8.1 g/dL Final   Albumin 91/98/7974 3.8  3.5 - 5.0 g/dL Final   AST 91/98/7974 18  15 - 41 U/L Final   ALT 08/13/2023 25  0 - 44 U/L Final   Alkaline Phosphatase 08/13/2023 71  38 - 126 U/L Final   Total Bilirubin 08/13/2023 0.6  0.0 - 1.2 mg/dL Final   GFR, Estimated 08/13/2023 >60  >60 mL/min Final   Comment: (NOTE) Calculated using the CKD-EPI Creatinine Equation (2021)    Anion gap 08/13/2023 11  5 - 15 Final   Performed at [facility], [address] [place], [place]  [address]   WBC 08/13/2023 7.7  4.0 - 10.5 K/uL Final   RBC 08/13/2023 4.19 (L)  4.22 - 5.81 MIL/uL Final   Hemoglobin 08/13/2023 12.5 (L)  13.0 - 17.0 g/dL Final   HCT 91/98/7974 36.7 (L)  39.0 - 52.0 % Final   MCV 08/13/2023 87.6  80.0 - 100.0 fL Final   MCH 08/13/2023 29.8  26.0 - 34.0 pg Final   MCHC 08/13/2023 34.1  30.0 - 36.0 g/dL Final   RDW 91/98/7974 13.6  11.5 -  15.5 % Final   Platelets 08/13/2023 302  150 - 400 K/uL Final   nRBC 08/13/2023 0.0  0.0 - 0.2 % Final   Neutrophils Relative % 08/13/2023 67  % Final   Neutro Abs 08/13/2023 5.2  1.7 - 7.7 K/uL Final   Lymphocytes Relative 08/13/2023 24  % Final   Lymphs Abs 08/13/2023 1.8  0.7 - 4.0 K/uL Final   Monocytes Relative 08/13/2023 9  % Final   Monocytes Absolute 08/13/2023 0.7  0.1 - 1.0 K/uL Final   Eosinophils Relative 08/13/2023 0  % Final   Eosinophils Absolute 08/13/2023 0.0  0.0 - 0.5 K/uL Final   Basophils Relative 08/13/2023 0  % Final   Basophils Absolute 08/13/2023 0.0  0.0 - 0.1 K/uL Final   Immature Granulocytes 08/13/2023 0  % Final   Abs Immature Granulocytes 08/13/2023 0.03  0.00 - 0.07 K/uL Final   Performed at [facility], [address] [place], [place] [address]   Salicylate Lvl 08/13/2023 <7.0 (L)  7.0 - 30.0 mg/dL Final   Performed at [facility], [address] [place], [place] [address]   Acetaminophen  (Tylenol ), Serum 08/13/2023 <10 (L)  10 - 30 ug/mL Final   Comment: (NOTE) Therapeutic concentrations vary significantly. A range of 10-30 ug/mL  may be an effective concentration for many patients. However, some  are best treated at concentrations outside of this range. Acetaminophen  concentrations >150 ug/mL at 4 hours after ingestion  and >50 ug/mL at 12 hours after ingestion are often associated with  toxic reactions.  Performed at [facility], [address] [place], [place] [address]    Lithium  Lvl 08/13/2023 <0.06 (L)  0.60 - 1.20 mmol/L Final    Performed at [facility], [address] [place], [place] [address]   Alcohol, Ethyl (B) 08/13/2023 <15  <15 mg/dL Final   Comment: (NOTE) For medical purposes only. Performed at [facility], [address] [place], [place] [address]     PSYCHIATRIC REVIEW OF SYSTEMS (ROS)  ROS: Notable for the following relevant positive findings: Review of Systems  Constitutional: Negative.   HENT: Negative.    Eyes: Negative.   Respiratory: Negative.    Cardiovascular: Negative.   Gastrointestinal: Negative.   Genitourinary: Negative.   Musculoskeletal: Negative.   Skin: Negative.   Neurological: Negative.   Endo/Heme/Allergies: Negative.   Psychiatric/Behavioral:  Positive for depression, hallucinations and suicidal ideas. The patient is nervous/anxious.     Additional findings:      Musculoskeletal: No abnormal movements observed      Gait & Station: Normal      Pain Screening: Denies      Nutrition & Dental Concerns: Reviewed   RISK FORMULATION/ASSESSMENT  Is the patient experiencing any suicidal or homicidal ideations: Yes        Explain if yes: Patient continuing to hear command hallucinations telling him to kill himself  Protective factors considered for safety management:   Patient unwilling to use family support.  Has not followed up with outpatient provider.  Risk factors/concerns considered for safety management:  Prior attempt Depression Hopelessness Impulsivity Aggression Isolation Barriers to accessing treatment Male gender Unmarried  Is there a safety management plan with the patient and treatment team to minimize risk factors and promote protective factors: No            Explain: As above, patient unable to connect with family or community resources  Is crisis care placement or psychiatric hospitalization recommended: Yes  Based on my current evaluation and risk assessment, patient is determined at this time to be at:  High  risk  *RISK ASSESSMENT Risk assessment is a dynamic process; it is possible that this patient's condition, and risk level, may change. This should be re-evaluated and managed over time as appropriate. Please re-consult psychiatric consult services if additional assistance is needed in terms of risk assessment and management. If your team decides to discharge this patient, please advise the patient how to best access emergency psychiatric services, or to call 911, if their condition worsens or they feel unsafe in any way.   Adriana JINNY Pontes, MD Telepsychiatry Consult Services

## 2023-08-14 NOTE — ED Notes (Signed)
Patient is vol pending TOC placement 

## 2023-08-14 NOTE — ED Notes (Signed)
 Pt received evening snack . Room was discarded of any waste left from lunch, day snack , and dinner.

## 2023-08-14 NOTE — ED Provider Notes (Signed)
 Dr. Drury reports he has not yet finalized his consult note but his current recommendation is that the patient may be admitted to psychiatry, currently on a voluntary basis.  Further final evaluation to follow but at this point patient has been agreeable with, and plan for voluntary psychiatric admission   Dicky Anes, MD 08/14/23 [phone]

## 2023-08-14 NOTE — ED Notes (Signed)
 Pt arrived to [facility] escorted by ED nurse ans security. Pt alert and calm at this time. Oriented to unit and made aware of security monitors and policies. Pt verbalized understanding.

## 2023-08-14 NOTE — BH Assessment (Signed)
 Per [facility]), patient to be referred out of system.  Referral information for Psychiatric Hospitalization faxed to;   Service Provider Phone  [facility] (no appropriate bed) [phone]  [facility]  [phone]  [facility]  [phone]  [facility]  [phone]  [facility]  [phone]  [facility]  [phone]  [facility]  [phone]  [facility]  [phone]  [facility]  [phone]  [facility]  [phone]  [facility]  [phone]  [place] [facility]  [phone]  [facility]  [phone]  [facility]  [phone]  [facility]  [phone]

## 2023-08-14 NOTE — ED Notes (Signed)
VOL, pending psych consult

## 2023-08-14 NOTE — Progress Notes (Signed)
 MEDICATION RELATED CONSULT NOTE - INITIAL   Pharmacy Consult for Clozaril  monitoring  Indication: Clozaril  (neutropenia)   Allergies  Allergen Reactions   Ibuprofen Swelling    Tongue swelling   Risperidone And Paliperidone Other (See Comments) and Swelling    gynecomastia gynecomastia    Ziprasidone  Swelling    Tongue swells   Benztropine  Other (See Comments)    Causes confusion, depression, and delusions   Quetiapine  Other (See Comments)    Depression, suicidality, adverse effect: seizures     Patient Measurements: Height: 5' 11 (180.3 cm) Weight: 76.7 kg (169 lb 1.5 oz) IBW/kg (Calculated) : 75.3 Adjusted Body Weight:   Vital Signs: Temp: 97.6 F (36.4 C) (08/01 2229) Temp Source: Oral (08/01 2229) BP: 91/57 (08/01 2229) Pulse Rate: 68 (08/01 2229) Intake/Output from previous day: No intake/output data recorded. Intake/Output from this shift: No intake/output data recorded.  Labs: Recent Labs    08/13/23 1011  WBC 7.7  HGB 12.5*  HCT 36.7*  PLT 302  CREATININE 0.88  ALBUMIN 3.8  PROT 7.0  AST 18  ALT 25  ALKPHOS 71  BILITOT 0.6   Estimated Creatinine Clearance: 128.4 mL/min (by C-G formula based on SCr of 0.88 mg/dL).   Microbiology: No results found for this or any previous visit (from the past 720 hours).  Medical History: Past Medical History:  Diagnosis Date   B12 deficiency 05/27/2023   Folate deficiency 05/27/2023   Schizo affective schizophrenia (HCC)    Schizophrenia (HCC)     Medications:   Plan:  8/1:  ANC = 5.2 - eligibile to receive Clozaril  - recheck ANC Q weekly  - next ANC on 8/8 @ 1200   Mylin Hirano D 08/14/2023,3:29 AM

## 2023-08-14 NOTE — BH Assessment (Signed)
 Comprehensive Clinical Assessment (CCA) Note  08/14/2023 [name] [id number] [name] is a 33 year old, English speaking, Black male. Pt presented to [facility] ED Vol. Per triage note: Pt [place] [facility] [place] [facility] [place] [employer]. Pt was discharged from hospital today and went to the hotel in which police were called to remove pt. Pt slammed head on [employer] vehicle multiple times. Blood on top of head and L side of face upon assessment. Pt is tearful, agitated and screaming upon arrival.  Mental Status Examination: Appearance: Patient was observed resting with covers over his head. Appearance was neglected. Behavior: Lethargic and reticent. Calm and cooperative throughout the assessment. Speech: Slow rate of speech. Psychomotor Activity: Slowed. Mood: Depressed. Affect: Congruent with stated mood. Thought Process: Relevant and goal-directed. Thought Content: Patient reported ongoing suicidal ideation, stating he continues to experience thoughts telling him to kill himself. Denied current homicidal ideation, auditory/visual hallucinations, or paranoia. Orientation: Oriented to person, place, and time (x3). Insight/Judgment: Limited insight into current mental state; judgment appears impaired due to ongoing SI. Sleep: Patient expressed a desire for more sleep. Subjective Report: When asked how he was feeling, the patient stated, "I got a lot more going on that you don't know about," but did not elaborate. He reported head pain as the reason for presenting to the ER. He also shared that his relationships with family are strained due to his mental health. Assessment Summary: Patient presents with symptoms consistent with a depressive episode, including lethargy, slowed speech and psychomotor activity, depressed mood, and congruent affect. Suicidal ideation is present without a current plan or intent. No homicidal ideation or perceptual disturbances reported. Patient remains calm and cooperative. Chief  Complaint:  Chief Complaint  Patient presents with   Head Injury   Visit Diagnosis: 1.  Schizoaffective Disorder, Bipolar Type     CCA Screening, Triage and Referral (STR)  Patient Reported Information How did you hear about us ? Self  Referral name: No data recorded Referral phone number: No data recorded  Whom do you see for routine medical problems? No data recorded Practice/Facility Name: No data recorded Practice/Facility Phone Number: No data recorded Name of Contact: No data recorded Contact Number: No data recorded Contact Fax Number: No data recorded Prescriber Name: No data recorded Prescriber Address (if known): No data recorded  What Is the Reason for Your Visit/Call Today? Direct admit to [facility]  How Long Has This Been Causing You Problems? <Week  What Do You Feel Would Help You the Most Today? Treatment for Depression or other mood problem; Alcohol or Drug Use Treatment   Have You Recently Been in Any Inpatient Treatment (Hospital/Detox/Crisis Center/28-Day Program)? No data recorded Name/Location of Program/Hospital:No data recorded How Long Were You There? No data recorded When Were You Discharged? No data recorded  Have You Ever Received Services From [facility] Before? No data recorded Who Do You See at [facility]? No data recorded  Have You Recently Had Any Thoughts About Hurting Yourself? No  Are You Planning to Commit Suicide/Harm Yourself At This time? No   Have you Recently Had Thoughts About Hurting Someone Sherral? No  Explanation: Pt was calm and cooperative   Have You Used Any Alcohol or Drugs in the Past 24 Hours? Yes  How Long Ago Did You Use Drugs or Alcohol? No data recorded What Did You Use and How Much? Cocaine   Do You Currently Have a Therapist/Psychiatrist? No  Name of Therapist/Psychiatrist: [facility]   Have You Been Recently Discharged  From Any Public relations account executive or Programs? No  Explanation of Discharge From Practice/Program: No  data recorded    CCA Screening Triage Referral Assessment Type of Contact: Face-to-Face  Is this Initial or Reassessment? No data recorded Date Telepsych consult ordered in [facility]:  No data recorded Time Telepsych consult ordered in [facility]:  No data recorded  Patient Reported Information Reviewed? No data recorded Patient Left Without Being Seen? No data recorded Reason for Not Completing Assessment: No data recorded  Collateral Involvement: None provided   Does Patient Have a Court Appointed Legal Guardian? No data recorded Name and Contact of Legal Guardian: No data recorded If Minor and Not Living with Parent(s), Who has Custody? n/a  Is CPS involved or ever been involved? Never  Is APS involved or ever been involved? Never   Patient Determined To Be At Risk for Harm To Self or Others Based on Review of Patient Reported Information or Presenting Complaint? No  Method: Plan without intent  Availability of Means: No access or NA  Intent: Vague intent or NA  Notification Required: No need or identified person  Additional Information for Danger to Others Potential: -- (n/a)  Additional Comments for Danger to Others Potential: n/a  Are There Guns or Other Weapons in Your Home? No  Types of Guns/Weapons: Denies access to guns/ weapons  Are These Weapons Safely Secured?                            No  Who Could Verify You Are Able To Have These Secured: Denies access to guns/ weapons  Do You Have any Outstanding Charges, Pending Court Dates, Parole/Probation? Patient reports pending legal charges; upcoming court date.  Contacted To Inform of Risk of Harm To Self or Others: -- (n/a)   Location of Assessment: [facility] ED   Does Patient Present under Involuntary Commitment? No  IVC Papers Initial File Date: No data recorded  [place] of Residence: [place]   Patient Currently Receiving the Following Services: Not Receiving Services   Determination of Need: Emergent (2  hours)   Options For Referral: ED Visit     CCA Biopsychosocial Intake/Chief Complaint:  No data recorded Current Symptoms/Problems: No data recorded  Patient Reported Schizophrenia/Schizoaffective Diagnosis in Past: Yes   Strengths: Some insight, pleasant  Preferences: No data recorded Abilities: No data recorded  Type of Services Patient Feels are Needed: No data recorded  Initial Clinical Notes/Concerns: No data recorded  Mental Health Symptoms Depression:  Change in energy/activity; Difficulty Concentrating; Irritability   Duration of Depressive symptoms: Greater than two weeks   Mania:  Change in energy/activity; Increased Energy   Anxiety:   Worrying; Irritability; Difficulty concentrating   Psychosis:  Hallucinations   Duration of Psychotic symptoms: Less than six months   Trauma:  N/A   Obsessions:  N/A   Compulsions:  N/A   Inattention:  N/A   Hyperactivity/Impulsivity:  N/A   Oppositional/Defiant Behaviors:  N/A   Emotional Irregularity:  Potentially harmful impulsivity; Recurrent suicidal behaviors/gestures/threats   Other Mood/Personality Symptoms:  None reported    Mental Status Exam Appearance and self-care  Stature:  Average   Weight:  Average weight   Clothing:  Age-appropriate; Casual   Grooming:  Normal   Cosmetic use:  None   Posture/gait:  Normal   Motor activity:  -- (Within normal range)   Sensorium  Attention:  Normal   Concentration:  Normal   Orientation:  X5  Recall/memory:  Normal   Affect and Mood  Affect:  Depressed; Appropriate; Anxious   Mood:  Depressed; Anxious; Hopeless   Relating  Eye contact:  None   Facial expression:  Depressed; Responsive; Anxious   Attitude toward examiner:  Cooperative; Presenter, broadcasting and Language  Speech flow: Clear and Coherent; Normal   Thought content:  Appropriate to Mood and Circumstances   Preoccupation:  None   Hallucinations:  None   Organization:   No data recorded  [employer] of Knowledge:  Fair   Intelligence:  Average   Abstraction:  Normal   Judgement:  Fair   Dance movement psychotherapist:  Distorted   Insight:  Fair   Decision Making:  Normal   Social Functioning  Social Maturity:  Impulsive; Responsible   Social Judgement:  Normal; Chief of Staff   Stress  Stressors:  Transitions   Coping Ability:  Normal   Skill Deficits:  None   Supports:  Friends/Service system; Family     Religion:    Leisure/Recreation:    Exercise/Diet:     CCA Employment/Education Employment/Work Situation:    Education:     CCA Family/Childhood History Family and Relationship History:    Childhood History:     Child/Adolescent Assessment:     CCA Substance Use Alcohol/Drug Use:                           ASAM's:  Six Dimensions of Multidimensional Assessment  Dimension 1:  Acute Intoxication and/or Withdrawal Potential:      Dimension 2:  Biomedical Conditions and Complications:      Dimension 3:  Emotional, Behavioral, or Cognitive Conditions and Complications:     Dimension 4:  Readiness to Change:     Dimension 5:  Relapse, Continued use, or Continued Problem Potential:     Dimension 6:  Recovery/Living Environment:     ASAM Severity Score:    ASAM Recommended Level of Treatment:     Substance use Disorder (SUD)    Recommendations for Services/Supports/Treatments:    DSM5 Diagnoses: Patient Active Problem List   Diagnosis Date Noted   Schizophrenia, acute (HCC) 07/27/2023   Schizoaffective disorder, bipolar type (HCC) 05/25/2023   Schizophrenia (HCC) 05/25/2023   Cocaine use 05/11/2023   Aggressive behavior 05/11/2023   Cocaine abuse (HCC) 05/18/2022   Schizoaffective disorder (HCC) 06/27/2019   Schizophrenia, paranoid (HCC) 03/12/2019   Cocaine abuse with intoxication (HCC)    Self-inflicted laceration of right wrist (HCC) 11/30/2018   Suicidal ideation 11/29/2018    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

## 2023-08-14 NOTE — ED Notes (Signed)
 Pt given dinner tray.

## 2023-08-15 NOTE — ED Notes (Signed)
 IVC pending psych admit referred out

## 2023-08-15 NOTE — ED Notes (Signed)
Pt provided with snack at bedside

## 2023-08-15 NOTE — ED Notes (Signed)
Report received, assumed care of patient.

## 2023-08-15 NOTE — ED Notes (Signed)
Snack was provided to pt.

## 2023-08-15 NOTE — ED Notes (Signed)
Pt provided with lunch tray at bedside

## 2023-08-15 NOTE — ED Notes (Signed)
 Hospital meal provided, pt tolerated w/o complaints.  Waste discarded appropriately.

## 2023-08-15 NOTE — ED Provider Notes (Signed)
 Emergency Medicine Observation Re-evaluation Note  [name] is a 33 y.o. male, seen on rounds today.  Pt initially presented to the ED for complaints of Head Injury  Currently, the patient is is no acute distress. Denies any concerns at this time.  Physical Exam  Blood pressure 100/71, pulse 80, temperature 98.1 F (36.7 C), temperature source Oral, resp. rate 16, height 5' 11 (1.803 m), weight 76.7 kg, SpO2 98%.  Physical Exam: General: No apparent distress Pulm: Normal WOB Neuro: Moving all extremities Psych: Resting comfortably     ED Course / MDM     I have reviewed the labs performed to date as well as medications administered while in observation.  Recent changes in the last 24 hours include: No acute events overnight.  Plan   Current plan: Patient awaiting placement to inpatient psychiatry. Patient is not under full IVC at this time.    Suzanne Kirsch, MD 08/15/23 [phone]

## 2023-08-15 NOTE — ED Notes (Signed)
 Gave pt a cup of water and graham crackers per pt request and RN approval.

## 2023-08-15 NOTE — Consult Note (Signed)
 [facility] Psychiatric Consult Follow-up  Patient Name: .[name]  MRN: [id number]  DOB: 1990-10-07  Consult Order details:  Orders (From admission, onward)     Start     Ordered   08/13/23 1021  CONSULT TO CALL ACT TEAM       Ordering Provider: Suzanne Kirsch, MD  Provider:  (Not yet assigned)  Question:  Reason for Consult?  Answer:  Psych consult   08/13/23 1020   08/13/23 1021  IP CONSULT TO PSYCHIATRY       Ordering Provider: Suzanne Kirsch, MD  Provider:  (Not yet assigned)  Question Answer Comment  Consult Timeframe URGENT - requires response within 12 hours   URGENT timeframe requires provider to provider communication, has the provider to provider communication been completed Yes   Reason for Consult? Consult for medication management   Contact phone number where the requesting provider can be reached [phone]      08/13/23 1020             Mode of Visit: In person    Psychiatry Consult Evaluation  Service Date: August 15, 2023 LOS:  LOS: 0 days  Chief Complaint I need help  Primary Psychiatric Diagnoses  Schizoaffective Disorder, Bipolar Type   Assessment  [name] is a 33 y.o. male admitted: Presented to the ED  Initial Consult note:  The patient presents with long history of schizoaffective disorder, with multiple hospitalizations and suicide attempts.   Was just discharged from the Psych service about ten days ago.  He did not, however, follow up with providers nor use his prescribed lithium  and Clozaril .  As a result, he again exacerbated into hearing command hallucinations to tell him to kill himself.  Began to feel that could not be safe if not in hospital.  BAL OK.  UDS pending.  No homicidal ideation.  8/3: Patient is reassessed today in the BMU. He is lying on his assigned bed in no acute distress. He is easily awakened by calling his name. He states that he continues to experience AH and dreams. Today, he is somnolent and states that  he is groggy from the medication. Complaints of head pain from hitting his head in the police car. He denies blurred vision or syncopal episodes. There is no evidence of active bleeding from the areas. He continues to await bed placement. He has been calm, cooperative, and compliant. Continues to endorse SI with plan to jump off a building. There is history of suicide attempt by jumping off a bridge.     Diagnoses:  Active Hospital problems: Active Problems:   * No active hospital problems. *    Plan   ## Psychiatric Medication Recommendations:  Continue current medication   ## Medical Decision Making Capacity: Not specifically addressed in this encounter  ## Further Work-up:  Monitor headache resulting from head banging   ## Disposition:-- Recommend inpatient psychiatric hospitalization. Has been referred out  ## Behavioral / Environmental: -Utilize compassion and acknowledge the patient's experiences while setting clear and realistic expectations for care.    ## Safety and Observation Level:  - Based on my clinical evaluation, I estimate the patient to be at high risk of self harm in the current setting. - At this time, we recommend  1:1 Observation. This decision is based on my review of the chart including patient's history and current presentation, interview of the patient, mental status examination, and consideration of suicide risk including evaluating suicidal ideation, plan, intent, suicidal  or self-harm behaviors, risk factors, and protective factors. This judgment is based on our ability to directly address suicide risk, implement suicide prevention strategies, and develop a safety plan while the patient is in the clinical setting. Please contact our team if there is a concern that risk level has changed.  CSSR Risk Category:C-SSRS RISK CATEGORY: No Risk  Suicide Risk Assessment: Patient has following modifiable risk factors for suicide: active suicidal ideation, social  isolation, recklessness, medication noncompliance, active mental illness (to encompass adhd, tbi, mania, psychosis, trauma reaction), recent psychiatric hospitalization, and cultural beliefs (ie if there is belief that suicide is noble), which we are addressing by clear communication, medication stabilization. Patient has following non-modifiable or demographic risk factors for suicide: male gender, history of suicide attempt, history of self harm behavior, and psychiatric hospitalization Patient has the following protective factors against suicide: Access to outpatient mental health care  Thank you for this consult request. Recommendations have been communicated to the primary team.  We will continue to follow at this time.   Jagdeep Ancheta B Nekayla Heider, NP       History of Present Illness  Relevant Aspects of Hospital ED   Patient Report:  8/3: Patient is reassessed today in the BMU. He is lying on his assigned bed in no acute distress. He is easily awakened by calling his name. He states that he continues to experience AH and dreams. Today, he is somnolent and states that he is groggy from the medication. Complaints of head pain from hitting his head in the police car. He denies blurred vision or syncopal episodes. There is no evidence of active bleeding from the areas. He continues to await bed placement. He has been calm, cooperative, and compliant. Continues to endorse SI with plan to jump off a building. There is history of suicide attempt by jumping off a bridge.  Psych ROS:  Depression: endorses  Anxiety:  endorses  Mania (lifetime and current): endorses  Psychosis: (lifetime and current): endorses   Collateral information:  NA    Psychiatric and Social History  Psychiatric History:  Information collected from patient  Prev Dx/Sx: schizoaffective bipolar type Current Psych Provider: unknown Home Meds (current): unknown Previous Med Trials: unknown Therapy: unknown  Prior Psych  Hospitalization: multiple  Prior Self Harm: history of suicide attempt Prior Violence: yes  Family Psych History: unknown Family Hx suicide: unknown  Social History:   Educational Hx: unknown Occupational Hx: unknown Legal Hx: unknown Living Situation: homeless Spiritual Hx: unknown Access to weapons/lethal means: denies   Substance History Alcohol: unknown  Type of alcohol unknown Last Drink unknown Number of drinks per day unknown History of alcohol withdrawal seizures unknown History of DT's unknown Tobacco: unknown Illicit drugs: unknown Prescription drug abuse: unknown Rehab hx: unknown  Exam Findings  Physical Exam: I have reviewed and agree with initial provider's exam Vital Signs:  Temp:  [97.4 F (36.3 C)-98.1 F (36.7 C)] 98.1 F (36.7 C) (08/03 0749) Pulse Rate:  [71-80] 80 (08/03 0749) Resp:  [16-19] 16 (08/03 0749) BP: (95-104)/(54-71) 100/71 (08/03 0749) SpO2:  [96 %-100 %] 98 % (08/03 0749) Blood pressure 100/71, pulse 80, temperature 98.1 F (36.7 C), temperature source Oral, resp. rate 16, height 5' 11 (1.803 m), weight 76.7 kg, SpO2 98%. Body mass index is 23.58 kg/m.    Mental Status Exam: General Appearance: Fairly Groomed  Orientation:  Full (Time, Place, and Person)  Memory:  Immediate;   Fair Recent;   Fair Remote;   Fair  Concentration:  Concentration: Fair and Attention Span: Fair  Recall:  Fair  Attention  Fair  Eye Contact:  Good  Speech:  Normal Rate  Language:  Good  Volume:  Normal  Mood: anxious, depressed  Affect:  Appropriate  Thought Process:  Goal Directed  Thought Content:  Hallucinations: Auditory  Suicidal Thoughts:  Yes.  with intent/plan  Homicidal Thoughts:  No  Judgement:  Fair  Insight:  Fair  Psychomotor Activity:  Normal  Akathisia:  No  Fund of Knowledge:  Fair      Assets:  Communication Skills  Cognition:  WNL  ADL's:  Intact  AIMS (if indicated):        Other History   These have been  pulled in through the EMR, reviewed, and updated if appropriate.  Family History:  The patient's family history is not on file.  Medical History: Past Medical History:  Diagnosis Date  . B12 deficiency 05/27/2023  . Folate deficiency 05/27/2023  . Schizo affective schizophrenia (HCC)   . Schizophrenia [place] [facility])     Surgical History: No past surgical history on file.   Medications:   Current Facility-Administered Medications:  .  acetaminophen  (TYLENOL ) tablet 1,000 mg, 1,000 mg, Oral, TID PRN, Viviann Pastor, MD, 1,000 mg at 08/14/23 2143 .  cloZAPine  (CLOZARIL ) tablet 100 mg, 100 mg, Oral, QHS, Deaton, Rodney J, MD, 100 mg at 08/14/23 2119 .  hydrOXYzine  (ATARAX ) tablet 25 mg, 25 mg, Oral, TID PRN, Deaton, Rodney J, MD .  lithium  carbonate (ESKALITH ) ER tablet 900 mg, 900 mg, Oral, Q12H, Deaton, Adriana PARAS, MD, 900 mg at 08/15/23 0915 .  traZODone  (DESYREL ) tablet 50 mg, 50 mg, Oral, QHS, Deaton, Rodney J, MD, 50 mg at 08/14/23 2119  Current Outpatient Medications:  .  senna (SENOKOT) 8.6 MG TABS tablet, Take 2 tablets (17.2 mg total) by mouth at bedtime. (Patient not taking: Reported on 08/08/2023), Disp: 120 tablet, Rfl: 0  Allergies: Allergies  Allergen Reactions  . Ibuprofen Swelling    Tongue swelling  . Risperidone And Paliperidone Other (See Comments) and Swelling    gynecomastia gynecomastia   . Ziprasidone  Swelling    Tongue swells  . Benztropine  Other (See Comments)    Causes confusion, depression, and delusions  . Quetiapine  Other (See Comments)    Depression, suicidality, adverse effect: seizures     Daine KATHEE Ober, NP

## 2023-08-15 NOTE — ED Notes (Signed)
IVC psych admit 

## 2023-08-15 NOTE — BH Specialist Note (Signed)
 Per [facility]), patient to be referred out of system.  Referral information for Psychiatric Hospitalization faxed to;    Service Provider Phone  [facility] appropriate bed [phone]  [facility]  [phone]  [facility]  [phone]  [facility] [facility]  [phone]  [facility]  [phone]  [facility]  [phone]  [facility]  [phone]  [facility]  [phone]  [facility]  [phone]  [facility]  [phone]  [facility]  [phone]  [place] EFAX  [phone]  [facility]  [phone]  [facility]  [phone]  [facility]  [phone]

## 2023-08-16 NOTE — ED Notes (Signed)
 Pt Dinner provided at bedside

## 2023-08-16 NOTE — Progress Notes (Addendum)
 Patient has been re-faxed out to the following facilities:    Service Provider Phone  [facility]  [phone]  [facility]  [phone]  [facility]  [phone]  [facility]  [phone]  [facility]  [phone]  [facility]  [phone]  [facility]  [phone]  [facility]  [phone]  [facility]  [phone]  [facility]  [phone]  [facility]FA[facility]  [phone]  [facility]  [phone]  [facility]  [phone]  [facility]  [phone], [place] [phone]

## 2023-08-16 NOTE — Progress Notes (Signed)
 Patient has been accepted to [facility] 08/17/23 pending UDS, EKG    Patient assigned to room 503-1 Accepting physician is Dr. Raliegh. Call report to [phone]. Representative was [employer].   ER Staff is aware of it: Melody, ER Secretary Dr. Jossie, ER MD Leonor PEAK, Patient's Nurse  Address:  [address]       [place], [place] [address]

## 2023-08-16 NOTE — ED Notes (Signed)
 PATIENT IS admit to [facility] in [place] can go in the morning. No transport tonight.

## 2023-08-16 NOTE — ED Provider Notes (Signed)
 Emergency Medicine Observation Re-evaluation Note  [name] is a 33 y.o. male, seen on rounds today.  Pt initially presented to the ED for complaints of Head Injury Currently, the patient is resting.  Physical Exam  BP 113/64 (BP Location: Right Arm)   Pulse 65   Temp 98.7 F (37.1 C) (Oral)   Resp 16   Ht 5' 11 (1.803 m)   Wt 76.7 kg   SpO2 100%   BMI 23.58 kg/m   General: No distress   ED Course / MDM  EKG:   I have reviewed the labs performed to date as well as medications administered while in observation.  Recent changes in the last 24 hours include psych re-eval.  Plan  Current plan is for psych placement, remains under IVC.    Claudene Rover, MD 08/16/23 [phone]

## 2023-08-16 NOTE — Progress Notes (Incomplete Revision)
 Patient has been accepted to [facility] 08/17/23 pending UDS, EKG    Patient assigned to room 503-1 Accepting physician is Dr. Raliegh. Call report to [phone]. Representative was [employer].   ER Staff is aware of it: Melody, ER Secretary Dr. Jossie, ER MD Leonor PEAK, Patient's Nurse  Address:  [address]       [place], [place] [address]

## 2023-08-16 NOTE — ED Notes (Signed)
 Patient given snack at the bedside. No acute needs at this time.

## 2023-08-16 NOTE — ED Notes (Signed)
 IVC pending psych admit referred out

## 2023-08-17 ENCOUNTER — Other Ambulatory Visit: Payer: Self-pay

## 2023-08-17 ENCOUNTER — Inpatient Hospital Stay (HOSPITAL_COMMUNITY)
Admission: AD | Admit: 2023-08-17 | Discharge: 2023-08-25 | DRG: 897 | Disposition: A | Discharge status: Other Acute Inpatient Hospital | Source: Other Acute Inpatient Hospital | Attending: Student in an Organized Health Care Education/Training Program | Admitting: Student in an Organized Health Care Education/Training Program

## 2023-08-17 ENCOUNTER — Encounter (HOSPITAL_COMMUNITY): Payer: Self-pay | Admitting: Psychiatry

## 2023-08-17 DIAGNOSIS — Z9151 Personal history of suicidal behavior: Secondary | ICD-10-CM

## 2023-08-17 DIAGNOSIS — F1210 Cannabis abuse, uncomplicated: Secondary | ICD-10-CM | POA: Diagnosis present

## 2023-08-17 DIAGNOSIS — F17203 Nicotine dependence unspecified, with withdrawal: Secondary | ICD-10-CM | POA: Diagnosis present

## 2023-08-17 DIAGNOSIS — Z886 Allergy status to analgesic agent status: Secondary | ICD-10-CM

## 2023-08-17 DIAGNOSIS — M25531 Pain in right wrist: Secondary | ICD-10-CM | POA: Diagnosis not present

## 2023-08-17 DIAGNOSIS — F1410 Cocaine abuse, uncomplicated: Secondary | ICD-10-CM | POA: Diagnosis present

## 2023-08-17 DIAGNOSIS — F1520 Other stimulant dependence, uncomplicated: Secondary | ICD-10-CM | POA: Insufficient documentation

## 2023-08-17 DIAGNOSIS — F23 Brief psychotic disorder: Principal | ICD-10-CM | POA: Diagnosis present

## 2023-08-17 DIAGNOSIS — F1510 Other stimulant abuse, uncomplicated: Secondary | ICD-10-CM | POA: Diagnosis present

## 2023-08-17 DIAGNOSIS — Z5941 Food insecurity: Secondary | ICD-10-CM

## 2023-08-17 DIAGNOSIS — Z62811 Personal history of psychological abuse in childhood: Secondary | ICD-10-CM | POA: Diagnosis not present

## 2023-08-17 DIAGNOSIS — F70 Mild intellectual disabilities: Secondary | ICD-10-CM | POA: Diagnosis present

## 2023-08-17 DIAGNOSIS — F79 Unspecified intellectual disabilities: Secondary | ICD-10-CM | POA: Diagnosis not present

## 2023-08-17 DIAGNOSIS — Z62810 Personal history of physical and sexual abuse in childhood: Secondary | ICD-10-CM

## 2023-08-17 DIAGNOSIS — R4588 Nonsuicidal self-harm: Secondary | ICD-10-CM | POA: Diagnosis present

## 2023-08-17 DIAGNOSIS — Z765 Malingerer [conscious simulation]: Secondary | ICD-10-CM | POA: Diagnosis not present

## 2023-08-17 DIAGNOSIS — F259 Schizoaffective disorder, unspecified: Secondary | ICD-10-CM | POA: Diagnosis present

## 2023-08-17 DIAGNOSIS — Z91148 Patient's other noncompliance with medication regimen for other reason: Secondary | ICD-10-CM

## 2023-08-17 DIAGNOSIS — F19950 Other psychoactive substance use, unspecified with psychoactive substance-induced psychotic disorder with delusions: Principal | ICD-10-CM | POA: Diagnosis present

## 2023-08-17 DIAGNOSIS — T424X6A Underdosing of benzodiazepines, initial encounter: Secondary | ICD-10-CM | POA: Diagnosis present

## 2023-08-17 DIAGNOSIS — Z888 Allergy status to other drugs, medicaments and biological substances status: Secondary | ICD-10-CM

## 2023-08-17 DIAGNOSIS — Z5982 Transportation insecurity: Secondary | ICD-10-CM

## 2023-08-17 DIAGNOSIS — F1590 Other stimulant use, unspecified, uncomplicated: Secondary | ICD-10-CM | POA: Insufficient documentation

## 2023-08-17 LAB — URINE DRUG SCREEN, QUALITATIVE (ARMC ONLY)
Amphetamines, Ur Screen: NOT DETECTED
Barbiturates, Ur Screen: NOT DETECTED
Benzodiazepine, Ur Scrn: NOT DETECTED
Cannabinoid 50 Ng, Ur ~~LOC~~: POSITIVE — AB
Cocaine Metabolite,Ur ~~LOC~~: POSITIVE — AB
MDMA (Ecstasy)Ur Screen: NOT DETECTED
Methadone Scn, Ur: NOT DETECTED
Opiate, Ur Screen: NOT DETECTED
Phencyclidine (PCP) Ur S: NOT DETECTED
Tricyclic, Ur Screen: NOT DETECTED

## 2023-08-17 MED ORDER — DIPHENHYDRAMINE HCL 25 MG PO CAPS
50.0000 mg | ORAL_CAPSULE | Freq: Three times a day (TID) | ORAL | Status: DC | PRN
  Administered 2023-08-21: 50 mg via ORAL
  Filled 2023-08-17: qty 2

## 2023-08-17 MED ORDER — LORAZEPAM 2 MG/ML IJ SOLN
2.0000 mg | Freq: Three times a day (TID) | INTRAMUSCULAR | Status: DC | PRN
Start: 2023-08-17 — End: 2023-08-25
  Administered 2023-08-17 – 2023-08-23 (×3): 2 mg via INTRAMUSCULAR
  Filled 2023-08-17: qty 1

## 2023-08-17 MED ORDER — TRAZODONE HCL 50 MG PO TABS
50.0000 mg | ORAL_TABLET | Freq: Every day | ORAL | Status: DC
  Administered 2023-08-17 – 2023-08-24 (×10): 50 mg via ORAL
  Filled 2023-08-17 (×7): qty 1

## 2023-08-17 MED ORDER — ALUM & MAG HYDROXIDE-SIMETH 200-200-20 MG/5ML PO SUSP: 30.0000 mL | ORAL | Status: DC | PRN

## 2023-08-17 MED ORDER — DIPHENHYDRAMINE HCL 50 MG/ML IJ SOLN
50.0000 mg | Freq: Three times a day (TID) | INTRAMUSCULAR | Status: DC | PRN
  Administered 2023-08-17 – 2023-08-23 (×3): 50 mg via INTRAMUSCULAR
  Filled 2023-08-17: qty 1

## 2023-08-17 MED ORDER — HALOPERIDOL 5 MG PO TABS
5.0000 mg | ORAL_TABLET | Freq: Three times a day (TID) | ORAL | Status: DC | PRN
  Administered 2023-08-21: 5 mg via ORAL
  Filled 2023-08-17: qty 1

## 2023-08-17 MED ORDER — CLOZAPINE 100 MG PO TABS
100.0000 mg | ORAL_TABLET | Freq: Every day | ORAL | Status: DC
  Administered 2023-08-17: 100 mg via ORAL
  Filled 2023-08-17: qty 1

## 2023-08-17 MED ORDER — MAGNESIUM HYDROXIDE 400 MG/5ML PO SUSP
30.0000 mL | Freq: Every day | ORAL | Status: DC | PRN
Start: 2023-08-17 — End: 2023-08-25

## 2023-08-17 MED ORDER — DIPHENHYDRAMINE HCL 50 MG/ML IJ SOLN
50.0000 mg | Freq: Three times a day (TID) | INTRAMUSCULAR | Status: DC | PRN
  Administered 2023-08-21 – 2023-08-22 (×2): 50 mg via INTRAMUSCULAR
  Filled 2023-08-17 (×3): qty 1

## 2023-08-17 MED ORDER — HALOPERIDOL LACTATE 5 MG/ML IJ SOLN
5.0000 mg | Freq: Three times a day (TID) | INTRAMUSCULAR | Status: DC | PRN
  Administered 2023-08-21: 5 mg via INTRAMUSCULAR
  Filled 2023-08-17: qty 1

## 2023-08-17 MED ORDER — HALOPERIDOL LACTATE 5 MG/ML IJ SOLN
10.0000 mg | Freq: Three times a day (TID) | INTRAMUSCULAR | Status: DC | PRN
  Administered 2023-08-17 – 2023-08-23 (×4): 10 mg via INTRAMUSCULAR
  Filled 2023-08-17 (×3): qty 2

## 2023-08-17 MED ORDER — HYDROXYZINE HCL 25 MG PO TABS
25.0000 mg | ORAL_TABLET | Freq: Four times a day (QID) | ORAL | Status: DC | PRN
  Administered 2023-08-18 – 2023-08-24 (×9): 25 mg via ORAL
  Filled 2023-08-17 (×8): qty 1

## 2023-08-17 MED ORDER — LITHIUM CARBONATE ER 450 MG PO TBCR
900.0000 mg | EXTENDED_RELEASE_TABLET | Freq: Two times a day (BID) | ORAL | Status: DC
  Administered 2023-08-17 – 2023-08-19 (×4): 900 mg via ORAL
  Filled 2023-08-17 (×4): qty 2

## 2023-08-17 MED ORDER — ACETAMINOPHEN 325 MG PO TABS
650.0000 mg | ORAL_TABLET | Freq: Four times a day (QID) | ORAL | Status: DC | PRN
  Administered 2023-08-18 – 2023-08-21 (×2): 650 mg via ORAL
  Filled 2023-08-17 (×2): qty 2

## 2023-08-17 MED ORDER — TRAZODONE HCL 50 MG PO TABS
50.0000 mg | ORAL_TABLET | Freq: Every evening | ORAL | Status: DC | PRN
  Administered 2023-08-18 – 2023-08-24 (×3): 50 mg via ORAL
  Filled 2023-08-17 (×3): qty 1

## 2023-08-17 MED ORDER — LORAZEPAM 2 MG/ML IJ SOLN
2.0000 mg | Freq: Three times a day (TID) | INTRAMUSCULAR | Status: DC | PRN
  Administered 2023-08-21 – 2023-08-22 (×2): 2 mg via INTRAMUSCULAR
  Filled 2023-08-17 (×3): qty 1
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
## 2023-08-17 NOTE — Progress Notes (Addendum)
 Patient returned from dinner visibly agitated accompanied by Lowanda, MHT. Pt became loud and agitated on the unit, threatening to bang his head. Show of support called, staff attempted to verbally de-escalate patient multiples times without success. IM severe agitation protocol administered per MAR at 1813, IM Haldol  10mg , IM Benadryl  50mg  and IM Ativan  2mg  administered. Pt received medication willingly with no hold or restraint required.

## 2023-08-17 NOTE — Plan of Care (Signed)
  Problem: Education: Goal: Knowledge of [facility] Education information/materials will improve Outcome: Not Progressing Goal: Emotional status will improve Outcome: Not Progressing Goal: Mental status will improve Outcome: Not Progressing   Problem: Coping: Goal: Ability to verbalize frustrations and anger appropriately will improve Outcome: Not Progressing Goal: Ability to demonstrate self-control will improve Outcome: Not Progressing   Problem: Health Behavior/Discharge Planning: Goal: Identification of resources available to assist in meeting health care needs will improve Outcome: Not Progressing

## 2023-08-17 NOTE — Tx Team (Signed)
 Initial Treatment Plan 08/17/2023 6:29 PM [name] [id number]    PATIENT STRESSORS: Other: A  lot of things unable/unwilling to clarify     PATIENT STRENGTHS: Supportive family/friends    PATIENT IDENTIFIED PROBLEMS: Medication  Housing                   DISCHARGE CRITERIA:     PRELIMINARY DISCHARGE PLAN:   PATIENT/FAMILY INVOLVEMENT: This treatment plan has been presented to and reviewed with the patient, [name], and/or family member.  The patient and family have been given the opportunity to ask questions and make suggestions.  Prentice Greg Angle, RN 08/17/2023, 6:29 PM

## 2023-08-17 NOTE — ED Notes (Signed)
 [facility] notified about transport to [facility]

## 2023-08-17 NOTE — ED Notes (Signed)
 ivc/patient has been accepted to [facility] on 08/17/23.

## 2023-08-17 NOTE — Progress Notes (Signed)
 Patient was in cafeteria eating and became angry due to another patient  coughing and spitting while he was eating. He got up and stood against the wall loudly talking to peers and staff. MHT tried to deescalate  and he was unable to be redirected and was escorted back to the 500 unit with staff.

## 2023-08-18 ENCOUNTER — Encounter (HOSPITAL_COMMUNITY): Payer: Self-pay

## 2023-08-18 DIAGNOSIS — F70 Mild intellectual disabilities: Secondary | ICD-10-CM | POA: Insufficient documentation

## 2023-08-18 DIAGNOSIS — F1590 Other stimulant use, unspecified, uncomplicated: Secondary | ICD-10-CM | POA: Insufficient documentation

## 2023-08-18 DIAGNOSIS — F23 Brief psychotic disorder: Secondary | ICD-10-CM

## 2023-08-18 MED ORDER — CLOZAPINE 25 MG PO TABS
50.0000 mg | ORAL_TABLET | Freq: Every day | ORAL | Status: DC
  Administered 2023-08-18 – 2023-08-19 (×2): 50 mg via ORAL
  Filled 2023-08-18 (×2): qty 2

## 2023-08-18 MED ORDER — ARIPIPRAZOLE 5 MG PO TABS
5.0000 mg | ORAL_TABLET | Freq: Every day | ORAL | Status: DC
  Administered 2023-08-18 – 2023-08-20 (×3): 5 mg via ORAL
  Filled 2023-08-18 (×3): qty 1
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
## 2023-08-18 NOTE — Progress Notes (Signed)
(  Sleep Hours) - 8 hours (Any PRNs that were needed, meds refused, or side effects to meds)- None (Any disturbances and when (visitation, over night)-  None (Concerns raised by the patient)- None (SI/HI/AVH)-  Denies all.

## 2023-08-18 NOTE — Group Note (Signed)
 Recreation Therapy Group Note   Group Topic:Leisure Education  Group Date: 08/18/2023 Start Time: 1001 End Time: 1040 Facilitators: Alanys Godino-McCall, LRT,CTRS Location: 500 Hall Dayroom   Group Topic: Leisure Education  Goal Area(s) Addresses:  Patient will successfully identify positive leisure and recreation activities.  Patient will acknowledge benefits of participation in healthy leisure activities post discharge.   Behavioral Response:    Intervention: [employer] , Music   Activity: Jenga. Patients stacked blocks straight up three in each row facing different directions. Individually, patients took turns removing blocks from the tower. Each time a person removed a block, they stacked that block on top of the tower. The person who knocks the tower over, loses the game. LRT and patients then discussed the benefits of leisure at the end of the game.   Education:  Teacher, English as a foreign language, Leisure as Merchant navy officer, Programmer, applications, Building control surveyor   Education Outcome: Acknowledges education/In group clarification offered/Needs additional education   Affect/Mood: N/A   Participation Level: Did not attend    Clinical Observations/Individualized Feedback:      Plan: Continue to engage patient in RT group sessions 2-3x/week.   [name], LRT,CTRS 08/18/2023 12:38 PM

## 2023-08-18 NOTE — Group Note (Signed)
 Date:  08/18/2023 Time:  8:25 PM  Group Topic/Focus:  Wrap-Up Group:   The focus of this group is to help patients review their daily goal of treatment and discuss progress on daily workbooks.    Participation Level:  Did Not Attend  Participation Quality:  N/A  Affect:  N/A  Cognitive:  N/A  Insight: None  Engagement in Group:  N/A  Modes of Intervention:  N/A  Additional Comments:  Patient was encouraged but choose to not attend.   [name] 08/18/2023, 8:25 PM

## 2023-08-18 NOTE — Plan of Care (Signed)
   Problem: Health Behavior/Discharge Planning: Goal: Identification of resources available to assist in meeting health care needs will improve Outcome: Progressing Goal: Compliance with treatment plan for underlying cause of condition will improve Outcome: Progressing   Problem: Physical Regulation: Goal: Ability to maintain clinical measurements within normal limits will improve Outcome: Progressing

## 2023-08-18 NOTE — Plan of Care (Signed)
  Problem: Education: Goal: Emotional status will improve Outcome: Progressing Goal: Verbalization of understanding the information provided will improve Outcome: Progressing   Problem: Activity: Goal: Sleeping patterns will improve Outcome: Progressing   Problem: Health Behavior/Discharge Planning: Goal: Compliance with treatment plan for underlying cause of condition will improve Outcome: Progressing   Problem: Safety: Goal: Periods of time without injury will increase Outcome: Progressing
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
## 2023-08-18 NOTE — Progress Notes (Signed)
 Collateral contact   Patient said that his attorney requested a letter proving he is in the hospital.  Patient signed the ROI.  CSW emailed the letter to [email or web address].   Conversation with patient: Patient said he called [facility].  He was not accepted because of mental health needs.   Rosealynn Mateus, LCSWA 08/18/2023
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
## 2023-08-18 NOTE — Plan of Care (Signed)
   Problem: Education: Goal: Emotional status will improve Outcome: Not Progressing Goal: Mental status will improve Outcome: Not Progressing

## 2023-08-18 NOTE — Progress Notes (Signed)
   08/18/23 1200  Psych Admission Type (Psych Patients Only)  Admission Status Involuntary  Psychosocial Assessment  Patient Complaints Other (Comment) (finding housing)  Eye Contact Fair  Facial Expression Animated  Affect Appropriate to circumstance  Speech Logical/coherent  Interaction Assertive  Motor Activity Other (Comment) (WNL)  Appearance/Hygiene Unremarkable  Behavior Characteristics Cooperative  Mood Anxious;Preoccupied  Thought Process  Coherency Circumstantial  Content Preoccupation;Blaming others  Delusions Paranoid  Perception WDL  Hallucination None reported or observed  Judgment Poor  Confusion None  Danger to Self  Current suicidal ideation? Denies  Agreement Not to Harm Self Yes  Description of Agreement verbal  Danger to Others  Danger to Others None reported or observed  Danger to Others Abnormal  Harmful Behavior to others No threats or harm toward other people

## 2023-08-18 NOTE — B[facility] Group Notes (Addendum)
  ]   Adult Psychoeducational Group Note  Date:  08/18/2023 Time:  5:30 AM  Group Topic/Focus:    Participation Level:    Participation Quality:    Affect:    Cognitive:    Insight:   Engagement in Group:    Modes of Intervention:    Additional Comments:  Pt did not attend group.   Drue Pouch 08/18/2023, 5:30 AMThe focus of this group is to help patients establish daily goals to achieve during treatment and discuss how the patient can incorporate goal setting into their daily lives to aide in recovery.

## 2023-08-18 NOTE — BH IP Treatment Plan (Signed)
 Interdisciplinary Treatment and Diagnostic Plan Update  08/18/2023 Time of Session: 1015AM DABNEY SCHANZ MRN: [id number]  Principal Diagnosis: Schizophrenia, acute [facility])  Secondary Diagnoses: Principal Problem:   Schizophrenia, acute (HCC)   Current Medications:  Current Facility-Administered Medications  Medication Dose Route Frequency Provider Last Rate Last Admin   acetaminophen  (TYLENOL ) tablet 650 mg  650 mg Oral Q6H PRN Jadapalle, Sree, MD       alum & mag hydroxide-simeth (MAALOX/MYLANTA) 200-200-20 MG/5ML suspension 30 mL  30 mL Oral Q4H PRN Jadapalle, Sree, MD       cloZAPine  (CLOZARIL ) tablet 100 mg  100 mg Oral QHS Jadapalle, Sree, MD   100 mg at 08/17/23 2145   haloperidol  (HALDOL ) tablet 5 mg  5 mg Oral TID PRN Jadapalle, Sree, MD       And   diphenhydrAMINE  (BENADRYL ) capsule 50 mg  50 mg Oral TID PRN Jadapalle, Sree, MD       haloperidol  lactate (HALDOL ) injection 5 mg  5 mg Intramuscular TID PRN Jadapalle, Sree, MD       And   diphenhydrAMINE  (BENADRYL ) injection 50 mg  50 mg Intramuscular TID PRN Jadapalle, Sree, MD       And   LORazepam  (ATIVAN ) injection 2 mg  2 mg Intramuscular TID PRN Jadapalle, Sree, MD       haloperidol  lactate (HALDOL ) injection 10 mg  10 mg Intramuscular TID PRN Jadapalle, Sree, MD   10 mg at 08/17/23 1813   And   diphenhydrAMINE  (BENADRYL ) injection 50 mg  50 mg Intramuscular TID PRN Jadapalle, Sree, MD   50 mg at 08/17/23 1813   And   LORazepam  (ATIVAN ) injection 2 mg  2 mg Intramuscular TID PRN Jadapalle, Sree, MD   2 mg at 08/17/23 1812   hydrOXYzine  (ATARAX ) tablet 25 mg  25 mg Oral Q6H PRN Jadapalle, Sree, MD       lithium  carbonate (ESKALITH ) ER tablet 900 mg  900 mg Oral Q12H Jadapalle, Sree, MD   900 mg at 08/18/23 9045   magnesium  hydroxide (MILK OF MAGNESIA) suspension 30 mL  30 mL Oral Daily PRN Donnelly Mellow, MD       traZODone  (DESYREL ) tablet 50 mg  50 mg Oral QHS Jadapalle, Sree, MD   50 mg at 08/17/23 2145   traZODone   (DESYREL ) tablet 50 mg  50 mg Oral QHS PRN Jadapalle, Sree, MD       PTA Medications: Medications Prior to Admission  Medication Sig Dispense Refill Last Dose/Taking   senna (SENOKOT) 8.6 MG TABS tablet Take 2 tablets (17.2 mg total) by mouth at bedtime. (Patient not taking: Reported on 08/08/2023) 120 tablet 0     Patient Stressors: Other: A  lot of things unable/unwilling to clarify    Patient Strengths: Supportive family/friends   Treatment Modalities: Medication Management, Group therapy, Case management,  1 to 1 session with clinician, Psychoeducation, Recreational therapy.   Physician Treatment Plan for Primary Diagnosis: Schizophrenia, acute (HCC) Long Term Goal(s):     Short Term Goals:    Medication Management: Evaluate patient's response, side effects, and tolerance of medication regimen.  Therapeutic Interventions: 1 to 1 sessions, Unit Group sessions and Medication administration.  Evaluation of Outcomes: Not Progressing  Physician Treatment Plan for Secondary Diagnosis: Principal Problem:   Schizophrenia, acute (HCC)  Long Term Goal(s):     Short Term Goals:       Medication Management: Evaluate patient's response, side effects, and tolerance of medication regimen.  Therapeutic Interventions: 1  to 1 sessions, Unit Group sessions and Medication administration.  Evaluation of Outcomes: Not Progressing   RN Treatment Plan for Primary Diagnosis: Schizophrenia, acute (HCC) Long Term Goal(s): Knowledge of disease and therapeutic regimen to maintain health will improve  Short Term Goals: Ability to remain free from injury will improve, Ability to verbalize frustration and anger appropriately will improve, Ability to demonstrate self-control, Ability to participate in decision making will improve, Ability to verbalize feelings will improve, Ability to disclose and discuss suicidal ideas, Ability to identify and develop effective coping behaviors will improve, and  Compliance with prescribed medications will improve  Medication Management: RN will administer medications as ordered by provider, will assess and evaluate patient's response and provide education to patient for prescribed medication. RN will report any adverse and/or side effects to prescribing provider.  Therapeutic Interventions: 1 on 1 counseling sessions, Psychoeducation, Medication administration, Evaluate responses to treatment, Monitor vital signs and CBGs as ordered, Perform/monitor CIWA, COWS, AIMS and Fall Risk screenings as ordered, Perform wound care treatments as ordered.  Evaluation of Outcomes: Not Progressing   LCSW Treatment Plan for Primary Diagnosis: Schizophrenia, acute (HCC) Long Term Goal(s): Safe transition to appropriate next level of care at discharge, Engage patient in therapeutic group addressing interpersonal concerns.  Short Term Goals: Engage patient in aftercare planning with referrals and resources, Increase social support, Increase ability to appropriately verbalize feelings, Increase emotional regulation, Facilitate acceptance of mental health diagnosis and concerns, Facilitate patient progression through stages of change regarding substance use diagnoses and concerns, Identify triggers associated with mental health/substance abuse issues, and Increase skills for wellness and recovery  Therapeutic Interventions: Assess for all discharge needs, 1 to 1 time with Social worker, Explore available resources and support systems, Assess for adequacy in community support network, Educate family and significant other(s) on suicide prevention, Complete Psychosocial Assessment, Interpersonal group therapy.  Evaluation of Outcomes: Not Progressing   Progress in Treatment: Attending groups: No. Participating in groups: No. Taking medication as prescribed: Yes. Toleration medication: Yes. Family/Significant other contact made: No, will contact:  consents pending Patient  understands diagnosis: No. Discussing patient identified problems/goals with staff: Yes. Medical problems stabilized or resolved: Yes. Denies suicidal/homicidal ideation: Yes. Issues/concerns per patient self-inventory: No.  New problem(s) identified: No, Describe:  none  New Short Term/Long Term Goal(s): medication stabilization, elimination of SI thoughts, development of comprehensive mental wellness plan.    Patient Goals:  Get on the right meds and find placement  Discharge Plan or Barriers: Patient recently admitted. CSW will continue to follow and assess for appropriate referrals and possible discharge planning.    Reason for Continuation of Hospitalization: Hallucinations Medication stabilization Suicidal ideation  Estimated Length of Stay: 5-7 days  Last 3 [place] Suicide Severity Risk Score: Flowsheet Row [facility] (Current[facility] Most recent reading at 08/17/2023  1:30 PM ED from 08/13/2023 in [facility] Emergency Department at [facility] Most recent reading at 08/13/2023 10:01 AM ED from 08/13/2023 in [facility] at [facility] Most recent reading at 08/13/2023  6:37 AM  C-SSRS RISK CATEGORY High Risk No Risk No Risk    Last PHQ 2/9 Scores:    08/10/2023    3:03 PM  Depression screen PHQ 2/9  Decreased Interest 1  Down, Depressed, Hopeless 1  PHQ - 2 Score 2  Altered sleeping 0  Tired, decreased energy 1  Change in appetite 0  Feeling bad or failure about yourself  1  Trouble concentrating 1  Moving slowly or fidgety/restless 0  Suicidal thoughts 0  PHQ-9 Score 5    Scribe for Treatment Team: Jenkins LULLA Primer, ISRAEL 08/18/2023 10:17 AM

## 2023-08-19 DIAGNOSIS — F79 Unspecified intellectual disabilities: Secondary | ICD-10-CM

## 2023-08-19 DIAGNOSIS — F19950 Other psychoactive substance use, unspecified with psychoactive substance-induced psychotic disorder with delusions: Principal | ICD-10-CM

## 2023-08-19 DIAGNOSIS — F1520 Other stimulant dependence, uncomplicated: Secondary | ICD-10-CM

## 2023-08-19 MED ORDER — LITHIUM CARBONATE ER 300 MG PO TBCR
300.0000 mg | EXTENDED_RELEASE_TABLET | Freq: Every day | ORAL | Status: AC
  Administered 2023-08-21: 300 mg via ORAL
  Filled 2023-08-19: qty 1

## 2023-08-19 MED ORDER — LITHIUM CARBONATE ER 300 MG PO TBCR
600.0000 mg | EXTENDED_RELEASE_TABLET | Freq: Every day | ORAL | Status: AC
  Administered 2023-08-20: 600 mg via ORAL
  Filled 2023-08-19: qty 2

## 2023-08-19 MED ORDER — LITHIUM CARBONATE ER 300 MG PO TBCR: 300.0000 mg | EXTENDED_RELEASE_TABLET | Freq: Two times a day (BID) | ORAL | Status: DC

## 2023-08-19 MED ORDER — LITHIUM CARBONATE ER 300 MG PO TBCR: 600.0000 mg | EXTENDED_RELEASE_TABLET | Freq: Two times a day (BID) | ORAL | Status: DC

## 2023-08-19 NOTE — Group Note (Signed)
 Recreation Therapy Group Note   Group Topic:Coping Skills  Group Date: 08/19/2023 Start Time: 1015 End Time: 1045 Facilitators: Vickee Mormino-McCall, LRT,CTRS Location: 500 Hall Dayroom   Group Topic: Coping Skills   Goal Area(s) Addresses: Patient will define what a coping skill is. Patient will create a list of healthy coping skills beginning with each letter of the alphabet. Patient will successfully identify positive coping skills they can use post d/c.   Behavioral Response:    Intervention: Worksheet   Activity: Coping A to Z. Patient asked to identify what a coping skill is and when they use them. Patients with Clinical research associate discussed healthy versus unhealthy coping skills. Next patients were given a blank worksheet titled Coping Skills A-Z. Patients were instructed to come up with at least one positive coping skill per letter of the alphabet, addressing a specific challenge (ex: stress, anger, anxiety, depression, grief, doubt, isolation, self-harm/suicidal thoughts, substance use). Patients were given 15 minutes before ideas were presented to the large group. Patients and LRT debriefed on the importance of coping skill selection based on situation and back-up plans when a skill tried is not effective. At the end of group, patients were given an handout of alphabetized strategies to keep for future reference.   Education: Pharmacologist, Scientist, physiological, Discharge Planning.    Education Outcome: Acknowledges education/Verbalizes understanding/In group clarification offered/Additional education needed   Affect/Mood: N/A   Participation Level: Did not attend    Clinical Observations/Individualized Feedback:      Plan: Continue to engage patient in RT group sessions 2-3x/week.   Luann Aspinwall-McCall, LRT,CTRS 08/19/2023 12:25 PM

## 2023-08-19 NOTE — Plan of Care (Signed)

## 2023-08-19 NOTE — Progress Notes (Signed)
 Collateral contact - [facility], [address] [place], [place] [address], [phone]   The receptionist said that patient needs to call for an interview.   Ziad Maye, LCSWA 08/19/2023

## 2023-08-19 NOTE — Progress Notes (Signed)
(  Sleep Hours) -  (Any PRNs that were needed, meds refused, or side effects to meds)- PRN Vistaril  25 mg   (Any disturbances and when (visitation, over night)- N/A  (Concerns raised by the patient)- I need something extra to help me sleep  (SI/HI/AVH)- denies
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
## 2023-08-19 NOTE — Progress Notes (Signed)
(  Sleep Hours) - 6.75  (Any PRNs that were needed, meds refused, or side effects to meds)- PRN - Trazodone  50 mg - sleep, Vistaril  25 mg anxiety - Effective.    (Any disturbances and when (visitation, over night)- None  (Concerns raised by the patient)- Patient requesting to stay in the Hospital forever because No one wants me outside all I know are the People at the hospital I have been doing this since I was 5 years, and I love being here  (SI/HI/AVH)- Denies

## 2023-08-19 NOTE — Group Note (Signed)
 Occupational Therapy Group Note  Group Topic:Coping Skills  Group Date: 08/19/2023 Start Time: 1501 End Time: 1530 Facilitators: Dot [place] MATSU, OT   Group Description: Group encouraged increased engagement and participation through discussion and activity focused on Coping Ahead. Patients were split up into teams and selected a card from a stack of positive coping strategies. Patients were instructed to act out/charade the coping skill for other peers to guess and receive points for their team. Discussion followed with a focus on identifying additional positive coping strategies and patients shared how they were going to cope ahead over the weekend while continuing hospitalization stay.  Therapeutic Goal(s): Identify positive vs negative coping strategies. Identify coping skills to be used during hospitalization vs coping skills outside of hospital/at home Increase participation in therapeutic group environment and promote engagement in treatment   Participation Level: Engaged   Participation Quality: Independent   Behavior: Appropriate   Speech/Thought Process: Circumstantial and Loose association    Affect/Mood: Appropriate   Insight: Fair   Judgement: Limited      Modes of Intervention: Education  Patient Response to Interventions:  Attentive   Plan: Continue to engage patient in OT groups 2 - 3x/week.  08/19/2023  [place] MATSU Dot, OT  Andersson Larrabee, OT

## 2023-08-19 NOTE — Plan of Care (Signed)
  Problem: Activity: Goal: Interest or engagement in activities will improve Outcome: Progressing Goal: Sleeping patterns will improve Outcome: Progressing   Problem: Safety: Goal: Periods of time without injury will increase Outcome: Progressing

## 2023-08-19 NOTE — Progress Notes (Addendum)
 Patient denies SI/HI/AVH this morning. Pt reports that he is feeling good this morning. Pt attended dinner last night and breakfast this morning with no concerns from staff regarding UR. Pt reports that he slept well last night. Patient has been compliant with medications and treatment plan. Q 15 minute safety checks are in place for patient's safety. Patient is currently safe on the unit.   08/19/23 0825  Psych Admission Type (Psych Patients Only)  Admission Status Involuntary  Psychosocial Assessment  Patient Complaints Anxiety  Eye Contact Fair  Facial Expression Animated  Affect Appropriate to circumstance  Speech Logical/coherent  Interaction Assertive  Motor Activity Fidgety  Appearance/Hygiene Disheveled  Behavior Characteristics Anxious;Fidgety  Mood Labile;Preoccupied  Thought Process  Coherency Circumstantial;Disorganized  Content Preoccupation;Blaming others  Delusions Paranoid  Perception WDL  Hallucination None reported or observed  Judgment Impaired  Confusion None  Danger to Self  Current suicidal ideation? Denies  Description of Suicide Plan No plan  Self-Injurious Behavior No self-injurious ideation or behavior indicators observed or expressed   Agreement Not to Harm Self Yes  Description of Agreement Verbal  Danger to Others  Danger to Others None reported or observed  Danger to Others Abnormal  Harmful Behavior to others No threats or harm toward other people  Destructive Behavior No threats or harm toward property

## 2023-08-19 NOTE — [facility] Suicide Risk Assessment (Addendum)
 [facility] INPATIENT:  Family/Significant Other Suicide Prevention Education  Suicide Prevention Education:  Contact Attempts: Ernestene Nightingale (sister) [phone], (name of family member/significant other) has been identified by the patient as the family member/significant other with whom the patient will be residing, and identified as the person(s) who will aid the patient.  Date and time of first attempt: 08/19/2023 / 6:45 PM  CSW left a voicemail.   Stormi Vandevelde O Juana Montini, LCSWA 08/19/2023, 6:44 PM

## 2023-08-19 NOTE — Group Note (Signed)
 Date:  08/19/2023 Time:  3:32 PM  Group Topic/Focus:  Goals Group:   The focus of this group is to help patients establish daily goals to achieve during treatment and discuss how the patient can incorporate goal setting into their daily lives to aide in recovery. Orientation:   The focus of this group is to educate the patient on the purpose and policies of crisis stabilization and provide a format to answer questions about their admission.  The group details unit policies and expectations of patients while admitted.    Participation Level:  Did Not Attend  Logan LITTIE Molly 08/19/2023, 3:32 PM

## 2023-08-19 NOTE — Group Note (Signed)
 Date:  08/19/2023 Time:  8:40 PM  Group Topic/Focus:  Wrap-Up Group:   The focus of this group is to help patients review their daily goal of treatment and discuss progress on daily workbooks.    Participation Level:  Active  Participation Quality:  Appropriate  Affect:  Appropriate  Cognitive:  Appropriate  Insight: Appropriate  Engagement in Group:  Engaged  Modes of Intervention:  Education and Exploration  Additional Comments:  Patient attended and participated in group tonight. He reports that his goal today was to not get mad.  He deep breath and walk away to not get mad.  Gwenn Chillington Dacosta 08/19/2023, 8:40 PM

## 2023-08-20 LAB — CBC WITH DIFFERENTIAL/PLATELET
Abs Immature Granulocytes: 0.02 K/uL (ref 0.00–0.07)
Basophils Absolute: 0 K/uL (ref 0.0–0.1)
Basophils Relative: 0 %
Eosinophils Absolute: 0.1 K/uL (ref 0.0–0.5)
Eosinophils Relative: 1 %
HCT: 41.8 % (ref 39.0–52.0)
Hemoglobin: 13.4 g/dL (ref 13.0–17.0)
Immature Granulocytes: 0 %
Lymphocytes Relative: 31 %
Lymphs Abs: 2.2 K/uL (ref 0.7–4.0)
MCH: 29.5 pg (ref 26.0–34.0)
MCHC: 32.1 g/dL (ref 30.0–36.0)
MCV: 91.9 fL (ref 80.0–100.0)
Monocytes Absolute: 0.8 K/uL (ref 0.1–1.0)
Monocytes Relative: 11 %
Neutro Abs: 4 K/uL (ref 1.7–7.7)
Neutrophils Relative %: 57 %
Platelets: 234 K/uL (ref 150–400)
RBC: 4.55 MIL/uL (ref 4.22–5.81)
RDW: 13.6 % (ref 11.5–15.5)
WBC: 7.1 K/uL (ref 4.0–10.5)
nRBC: 0 % (ref 0.0–0.2)

## 2023-08-20 MED ORDER — CLOZAPINE 25 MG PO TABS
25.0000 mg | ORAL_TABLET | Freq: Every day | ORAL | Status: DC
  Administered 2023-08-20: 25 mg via ORAL
  Filled 2023-08-20: qty 1

## 2023-08-20 MED ORDER — NICOTINE POLACRILEX 2 MG MT GUM
2.0000 mg | CHEWING_GUM | OROMUCOSAL | Status: DC | PRN
  Administered 2023-08-21 – 2023-08-25 (×5): 2 mg via ORAL
  Filled 2023-08-20 (×2): qty 1

## 2023-08-20 NOTE — Group Note (Signed)
 Recreation Therapy Group Note   Group Topic:Problem Solving  Group Date: 08/20/2023 Start Time: 1015 End Time: 1045 Facilitators: Stormee Duda-McCall, LRT,CTRS Location: [address] Dayroom   Group Topic: Problem Solving  Goal Area(s) Addresses:  Patient will identify positive ways to complete puzzles presented.  Patient will work effectively with peer to solve the puzzles presented in group.  Behavioral Response:    Intervention: Group Work, Problem Solving  Activity: In pairs, patients were asked to create a game with their teammate. Team's were tasked with designing a game, including a Name, Description of Game, Equipment/Supplies, Rules, and Number of players needed. Teach back used to present their game idea to the group.  Education:  Leisure Scientist, physiological, Special educational needs teacher, Teamwork, Discharge Planning  Education Outcome: Acknowledges education/In group clarification offered/Needs additional education.    Affect/Mood: N/A   Participation Level: Did not attend    Clinical Observations/Individualized Feedback:      Plan: Continue to engage patient in RT group sessions 2-3x/week.   Catlyn Shipton-McCall, LRT,CTRS  08/20/2023 11:51 AM

## 2023-08-20 NOTE — Plan of Care (Signed)

## 2023-08-20 NOTE — Progress Notes (Signed)
(  Sleep Hours) -6.25  (Any PRNs that were needed, meds refused, or side effects to meds)- Prn nicotine  gum, Vistaril  25 mg  (Any disturbances and when (visitation, over night)- N/A  (Concerns raised by the patient)- Pt concerned about his mental health and getting girls , pt educated on getting his mind and life on track and the latter will fall in place.   (SI/HI/AVH)- denies

## 2023-08-20 NOTE — Care Management Important Message (Signed)
 Medicare IM printed and given to social work to give to the patient. ?

## 2023-08-20 NOTE — Care Management Important Message (Signed)
 Patient informed of right to appeal discharge, provided phone number to [facility]. Patient expressed no interest in appealing discharge at this time. CSW will continue to monitor situation.   Wilmoth Rasnic, LCSWA 08/20/2023
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
## 2023-08-20 NOTE — [facility] Suicide Risk Assessment (Signed)
 [facility] INPATIENT:  Family/Significant Other Suicide Prevention Education  Suicide Prevention Education:  Contact Attempts: [name] [phone], (name of family member/significant other) has been identified by the patient as the family member/significant other with whom the patient will be residing, and identified as the person(s) who will aid the patient in the event of a mental health crisis.    Date and time of first attempt: 08/20/2023 / 7:20 PM  The phone rang but there was no response.  The message said, the mailbox is full and can't accept new messages.   [name] O Lahna Nath, LCSWA 08/20/2023, 7:20 PM

## 2023-08-20 NOTE — Plan of Care (Signed)
   Problem: Education: Goal: Emotional status will improve Outcome: Progressing   Problem: Activity: Goal: Interest or engagement in activities will improve Outcome: Progressing

## 2023-08-20 NOTE — [facility] Group Notes (Signed)
 Adult Psychoeducational Group Note  Date:  08/20/2023 Time:  9:15 AM  Group Topic/Focus:  Goals Group:   The focus of this group is to help patients establish daily goals to achieve during treatment and discuss how the patient can incorporate goal setting into their daily lives to aide in recovery. Orientation:   The focus of this group is to educate the patient on the purpose and policies of crisis stabilization and provide a format to answer questions about their admission.  The group details unit policies and expectations of patients while admitted.  Participation Level:  Active  Participation Quality:  Appropriate  Affect:  Appropriate  Cognitive:  Appropriate  Insight: Appropriate  Engagement in Group:  Engaged  Modes of Intervention:  Discussion  Additional Comments:  Pt attended the goals group and remained appropriate and engaged throughout the duration of the group.   Sacora Hawbaker O 08/20/2023, 9:15 AM

## 2023-08-20 NOTE — Group Note (Signed)
 Date:  08/20/2023 Time:  9:09 PM  Group Topic/Focus:  Wrap-Up Group:   The focus of this group is to help patients review their daily goal of treatment and discuss progress on daily workbooks.    Participation Level:  Active  Participation Quality:  Appropriate and Sharing  Affect:  Appropriate  Cognitive:  Appropriate  Insight: Appropriate  Engagement in Group:  Engaged  Modes of Intervention:  Discussion and Socialization  Additional Comments:  Patient shared that he had a good day. Patient rated his day a  7 or 8 out 10. Patient shared that he, put himself first by doing the right thing today. Patient shared that he went outside today and talked about his medication. Patient stated that his day for today, stay positive.   [name] 08/20/2023, 9:09 PM

## 2023-08-21 MED ORDER — ARIPIPRAZOLE 10 MG PO TABS
10.0000 mg | ORAL_TABLET | Freq: Every morning | ORAL | Status: DC
  Administered 2023-08-21: 10 mg via ORAL
  Filled 2023-08-21 (×2): qty 1

## 2023-08-21 NOTE — Group Note (Deleted)
 Date:  08/21/2023 Time:  9:26 PM  Group Topic/Focus:  Recovery Goals:   The focus of this group is to identify appropriate goals for recovery and establish a plan to achieve them.     Participation Level:  {B[facility] PARTICIPATION OZCZO:77735}  Participation Quality:  {B[facility] PARTICIPATION QUALITY:22265}  Affect:  {B[facility] AFFECT:22266}  Cognitive:  {B[facility] COGNITIVE:22267}  Insight: {BHH Insight2:20797}  Engagement in Group:  {B[facility] ENGAGEMENT IN HMNLE:77731}  Modes of Intervention:  {B[facility] MODES OF INTERVENTION:22269}  Additional Comments:  ***  [name] L 08/21/2023, 9:26 PM

## 2023-08-21 NOTE — [facility] Group Notes (Signed)
 [facility] Group Notes:  (Nursing/MHT/Case Management/Adjunct)  Date:  08/21/2023  Time:  2000  Type of Therapy:  Wrap up group  Participation Level:  Active  Participation Quality:  Appropriate, Inattentive, Redirectable, and Sharing  Affect:  Excited  Cognitive:  Oriented  Insight:  Limited  Engagement in Group:  Distracting and Improving  Modes of Intervention:  Clarification, Education, and Support  Summary of Progress/Problems: Positive thinking and positive change were discussed.   [name] 08/21/2023, 8:32 PM

## 2023-08-21 NOTE — Progress Notes (Signed)
   08/21/23 0900  Psych Admission Type (Psych Patients Only)  Admission Status Involuntary  Psychosocial Assessment  Patient Complaints None  Eye Contact Fair  Facial Expression Animated  Affect Appropriate to circumstance  Speech Logical/coherent  Interaction Assertive  Motor Activity Other (Comment) (WDL)  Appearance/Hygiene Unremarkable  Behavior Characteristics Cooperative  Mood Pleasant  Thought Process  Coherency Circumstantial  Content WDL  Delusions None reported or observed  Perception WDL  Hallucination None reported or observed  Judgment Impaired  Confusion None  Danger to Self  Current suicidal ideation? Denies  Agreement Not to Harm Self Yes  Description of Agreement verbal

## 2023-08-21 NOTE — Plan of Care (Signed)
  Problem: Education: Goal: Mental status will improve Outcome: Progressing   Problem: Activity: Goal: Interest or engagement in activities will improve Outcome: Progressing   Problem: Coping: Goal: Ability to verbalize frustrations and anger appropriately will improve Outcome: Progressing   Problem: Safety: Goal: Periods of time without injury will increase Outcome: Progressing
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
## 2023-08-21 NOTE — Group Note (Signed)
 Date:  08/21/2023 Time:  5:25 PM  Group Topic/Focus:  Dimensions of Wellness:   The focus of this group is to introduce the topic of wellness and using collage as a creative outlet for expressing emotions, reducing anxiety, fostering group cohesion and enabling personal insight and healing. .    Participation Level:  Active  Participation Quality:  Attentive  Affect:  Flat  Cognitive:  Alert  Insight: Improving  Engagement in Group:  Engaged  Modes of Intervention:  Activity, Discussion, Education, Exploration, Rapport Building, Socialization, and Support   [name] 08/21/2023, 5:25 PM

## 2023-08-22 MED ORDER — WHITE PETROLATUM EX OINT
TOPICAL_OINTMENT | CUTANEOUS | Status: AC
Start: 2023-08-22 — End: 2023-08-23
  Filled 2023-08-22: qty 5

## 2023-08-22 MED ORDER — ARIPIPRAZOLE 15 MG PO TABS
15.0000 mg | ORAL_TABLET | Freq: Every morning | ORAL | Status: DC
  Administered 2023-08-23 – 2023-08-25 (×6): 15 mg via ORAL
  Filled 2023-08-22: qty 7
  Filled 2023-08-22 (×3): qty 1

## 2023-08-22 NOTE — Progress Notes (Signed)
 Pt observed to be intrusive on the unit. Pt was boisterous and making other patients uncomfortable in the milleu. Pt was making inappropriate statements towards other patients in the dayroom. Staff attempted to redirect pt but pt stated you can't tell me what to do. Educated and reinforced to pt expectations for appropriate behavior on the unit.  Pt was observed to be irritable and agitated. Pt was given PO agitation protocol per MAR.

## 2023-08-22 NOTE — Plan of Care (Signed)
   Problem: Education: Goal: Emotional status will improve Outcome: Progressing
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
## 2023-08-22 NOTE — Group Note (Signed)
 Date:  08/22/2023 Time:  4:55 PM  Group Topic/Focus:  Healthy Communication:   The focus of this group is to discuss communication, barriers to communication, as well as healthy ways to communicate with others.     Participation Level:  Minimal  Participation Quality:  Attentive  Affect:  Labile  Cognitive:  Lacking  Insight: Limited  Engagement in Group:  Distracting and Limited  Modes of Intervention:  Activity, Discussion, Education, Exploration, and Socialization  Additional Comments:    [name] 08/22/2023, 4:55 PM

## 2023-08-22 NOTE — BH Assessment (Signed)
 Late entry for 2140:  Patient went to dayroom disrupting other patients, talking about being the devil and in the end he was going to win over Jesus. 2150: Dayroom was shut down and everyone was told to go on to bed. Patient refused stating we have 6 more min. Pt was encouraged by 2 other staff members to go to his room and patient paced around in hall.  2200:  Charge nurse ask to get agitation protocol ready. Pt refusing to go to room stating he was not tired and trying to interact with other patients at their doors. 2210:  Another nurse and I came out with agitation protocol. We ask patient nicely if we could give him these injections to help him relax and stay in his room. Patient agreed. 2230: Patient came out of room and ask me to tell the MD that that was the medication to help him. Then patient turned around and went back to room.

## 2023-08-22 NOTE — Progress Notes (Signed)
Pt did not attend goals group. 

## 2023-08-22 NOTE — Plan of Care (Signed)
   Problem: Safety: Goal: Periods of time without injury will increase Outcome: Progressing

## 2023-08-22 NOTE — [facility] Group Notes (Signed)
 [facility] Group Notes:  (Nursing/MHT/Case Management/Adjunct)  Date:  08/22/2023  Time:  9:10 PM  Type of Therapy:  Wrap-up group  Participation Level:  Active  Participation Quality:  Appropriate  Affect:  Appropriate  Cognitive:  Appropriate  Insight:  Appropriate  Engagement in Group:  Engaged  Modes of Intervention:  Education  Summary of Progress/Problems: PT goal to be happy. Rated day 10/10.  Grayce LITTIE Essex 08/22/2023, 9:10 PM

## 2023-08-22 NOTE — Plan of Care (Signed)
   Problem: Education: Goal: Knowledge of [facility] Education information/materials will improve Outcome: Progressing Goal: Emotional status will improve Outcome: Progressing Goal: Mental status will improve Outcome: Progressing Goal: Verbalization of understanding the information provided will improve Outcome: Progressing

## 2023-08-22 NOTE — Progress Notes (Addendum)
   08/22/23 1200  Psych Admission Type (Psych Patients Only)  Admission Status Involuntary  Psychosocial Assessment  Patient Complaints None  Eye Contact Brief  Facial Expression Animated  Affect Appropriate to circumstance  Speech Logical/coherent;Loud  Interaction Assertive  Motor Activity Fidgety  Appearance/Hygiene Disheveled  Behavior Characteristics Cooperative;Fidgety  Mood Preoccupied  Thought Process  Coherency Disorganized  Content WDL  Delusions None reported or observed  Perception WDL  Hallucination None reported or observed  Judgment Poor  Confusion None  Danger to Self  Current suicidal ideation? Denies  Danger to Others  Danger to Others None reported or observed

## 2023-08-22 NOTE — Progress Notes (Signed)
(  Sleep Hours) - 6.5 (Any PRNs that were needed, meds refused, or side effects to meds)- PRN Hydroxyzine , PRN Trazodone , PRN PO agitation protocol, and PRN Moderate agitation IM protocol. (Any disturbances and when (visitation, over night)- Pt was disorganized and intrusive on the unit. Pt needed frequent redirection and was agitated.  (Concerns raised by the patient)- None (SI/HI/AVH)- Denies, contracts for safety.

## 2023-08-22 NOTE — Progress Notes (Signed)
 Pt approached nurses station verbalizing that he felt agitated and needed help. Pt was observed to be agitated. Pt verbalized that the PO agitation medications he was given earlier were not helpful. Pt stated that he wanted to receive IM medication due to the medications being effective in the past. Pt was given moderate agitation IM protocol per MAR, no manual hold was necessary.

## 2023-08-23 ENCOUNTER — Encounter (HOSPITAL_COMMUNITY): Payer: Self-pay

## 2023-08-23 MED ORDER — DIVALPROEX SODIUM 500 MG PO DR TAB
500.0000 mg | DELAYED_RELEASE_TABLET | Freq: Every day | ORAL | Status: DC
  Administered 2023-08-23 – 2023-08-24 (×4): 500 mg via ORAL
  Filled 2023-08-23 (×2): qty 1
  Filled 2023-08-23: qty 7

## 2023-08-23 NOTE — [facility] Group Notes (Signed)
 Psychoeducational Group Note  Date:  08/23/2023 Time:  2000  Group Topic/Focus:  Alcoholics Anonymous Meeting  Participation Level: Did Not Attend  Participation Quality:  Not Applicable  Affect:  Not Applicable  Cognitive:  Not Applicable  Insight:  Not Applicable  Engagement in Group: Not Applicable  Additional Comments:  Did not attend.   Lenora Manuelita RAMAN 08/23/2023, 9:47 PM

## 2023-08-23 NOTE — Group Note (Signed)
 Date:  08/23/2023 Time:  9:00 AM  Group Topic/Focus:  Goals Group:   The focus of this group is to help patients establish daily goals to achieve during treatment and discuss how the patient can incorporate goal setting into their daily lives to aide in recovery.    Participation Level:  Did Not Attend    [name] [name] 08/23/2023, 9:00 AM

## 2023-08-23 NOTE — Progress Notes (Signed)
   08/23/23 1800  Psychosocial Assessment  Patient Complaints Agitation  Eye Contact Fair  Facial Expression Animated  Affect Irritable;Euphoric  Speech Aggressive;Loud  Interaction Assertive;Intrusive  Motor Activity Other (Comment) (wnl)  Appearance/Hygiene Unremarkable  Behavior Characteristics Appropriate to situation  Mood Elated  Thought Process  Coherency Disorganized  Content Delusions  Delusions Jealousy  Perception Illusions  Hallucination None reported or observed  Judgment Impaired  Confusion None  Danger to Self  Current suicidal ideation? Denies  Danger to Others  Danger to Others None reported or observed  Danger to Others Abnormal  Harmful Behavior to others No threats or harm toward other people  Destructive Behavior No threats or harm toward property

## 2023-08-23 NOTE — [facility] Group Notes (Signed)
 Spiritual care group on grief and loss facilitated by Chaplain Rockie Sofia, [facility]  Group Goal: Support / Education around grief and loss  Members engage in facilitated group support and psycho-social education.  Group Description:  Following introductions and group rules, group members engaged in facilitated group dialogue and support around topic of loss, with particular support around experiences of loss in their lives. Group Identified types of loss (relationships / self / things) and identified patterns, circumstances, and changes that precipitate losses. Reflected on thoughts / feelings around loss, normalized grief responses, and recognized variety in grief experience. Group encouraged individual reflection on safe space and on the coping skills that they are already utilizing.  Group drew on Adlerian / Rogerian and narrative framework  Patient Progress:  [name] attended group.  He had a difficult time taking conversation seriously at first, but was able to engage as group members began sharing more. He became drowsy at some point and fell asleep on his chair.

## 2023-08-23 NOTE — BH IP Treatment Plan (Signed)
 Interdisciplinary Treatment and Diagnostic Plan Update  08/23/2023 Time of Session: 12:00 PM - UPDATE [name] MRN: [id number]  Principal Diagnosis: Substance-induced psychotic disorder with delusions (HCC)  Secondary Diagnoses: Principal Problem:   Substance-induced psychotic disorder with delusions (HCC) Active Problems:   Stimulant use disorder   Intellectual developmental disorder, mild   Current Medications:  Current Facility-Administered Medications  Medication Dose Route Frequency Provider Last Rate Last Admin   acetaminophen  (TYLENOL ) tablet 650 mg  650 mg Oral Q6H PRN Jadapalle, Sree, MD   650 mg at 08/21/23 0902   alum & mag hydroxide-simeth (MAALOX/MYLANTA) 200-200-20 MG/5ML suspension 30 mL  30 mL Oral Q4H PRN Jadapalle, Sree, MD       ARIPiprazole  (ABILIFY ) tablet 15 mg  15 mg Oral q AM Lera Golas B, DO   15 mg at 08/23/23 9060   haloperidol  (HALDOL ) tablet 5 mg  5 mg Oral TID PRN Jadapalle, Sree, MD   5 mg at 08/21/23 2143   And   diphenhydrAMINE  (BENADRYL ) capsule 50 mg  50 mg Oral TID PRN Jadapalle, Sree, MD   50 mg at 08/21/23 2143   haloperidol  lactate (HALDOL ) injection 5 mg  5 mg Intramuscular TID PRN Jadapalle, Sree, MD   5 mg at 08/21/23 2359   And   diphenhydrAMINE  (BENADRYL ) injection 50 mg  50 mg Intramuscular TID PRN Jadapalle, Sree, MD   50 mg at 08/22/23 2219   And   LORazepam  (ATIVAN ) injection 2 mg  2 mg Intramuscular TID PRN Jadapalle, Sree, MD   2 mg at 08/22/23 2218   haloperidol  lactate (HALDOL ) injection 10 mg  10 mg Intramuscular TID PRN Jadapalle, Sree, MD   10 mg at 08/22/23 2219   And   diphenhydrAMINE  (BENADRYL ) injection 50 mg  50 mg Intramuscular TID PRN Jadapalle, Sree, MD   50 mg at 08/17/23 1813   And   LORazepam  (ATIVAN ) injection 2 mg  2 mg Intramuscular TID PRN Jadapalle, Sree, MD   2 mg at 08/17/23 1812   divalproex  (DEPAKOTE ) DR tablet 500 mg  500 mg Oral QHS Towana Leita SAILOR, MD       hydrOXYzine  (ATARAX ) tablet 25 mg  25  mg Oral Q6H PRN Jadapalle, Sree, MD   25 mg at 08/22/23 2139   magnesium  hydroxide (MILK OF MAGNESIA) suspension 30 mL  30 mL Oral Daily PRN Donnelly Mellow, MD       nicotine  polacrilex (NICORETTE ) gum 2 mg  2 mg Oral PRN Bobbitt, Shalon E, NP   2 mg at 08/21/23 9297   traZODone  (DESYREL ) tablet 50 mg  50 mg Oral QHS Jadapalle, Sree, MD   50 mg at 08/22/23 2139   traZODone  (DESYREL ) tablet 50 mg  50 mg Oral QHS PRN Jadapalle, Sree, MD   50 mg at 08/18/23 2138   PTA Medications: Medications Prior to Admission  Medication Sig Dispense Refill Last Dose/Taking   senna (SENOKOT) 8.6 MG TABS tablet Take 2 tablets (17.2 mg total) by mouth at bedtime. (Patient not taking: Reported on 08/08/2023) 120 tablet 0     Patient Stressors: Other: A  lot of things unable/unwilling to clarify    Patient Strengths: Supportive family/friends   Treatment Modalities: Medication Management, Group therapy, Case management,  1 to 1 session with clinician, Psychoeducation, Recreational therapy.   Physician Treatment Plan for Primary Diagnosis: Substance-induced psychotic disorder with delusions (HCC) Long Term Goal(s):     Short Term Goals: Ability to demonstrate self-control will improve Compliance with prescribed  medications will improve Ability to identify triggers associated with substance abuse/mental health issues will improve  Medication Management: Evaluate patient's response, side effects, and tolerance of medication regimen.  Therapeutic Interventions: 1 to 1 sessions, Unit Group sessions and Medication administration.  Evaluation of Outcomes: Progressing  Physician Treatment Plan for Secondary Diagnosis: Principal Problem:   Substance-induced psychotic disorder with delusions (HCC) Active Problems:   Stimulant use disorder   Intellectual developmental disorder, mild  Long Term Goal(s):     Short Term Goals: Ability to demonstrate self-control will improve Compliance with prescribed  medications will improve Ability to identify triggers associated with substance abuse/mental health issues will improve     Medication Management: Evaluate patient's response, side effects, and tolerance of medication regimen.  Therapeutic Interventions: 1 to 1 sessions, Unit Group sessions and Medication administration.  Evaluation of Outcomes: Progressing   RN Treatment Plan for Primary Diagnosis: Substance-induced psychotic disorder with delusions (HCC) Long Term Goal(s): Knowledge of disease and therapeutic regimen to maintain health will improve  Short Term Goals: Ability to remain free from injury will improve, Ability to verbalize frustration and anger appropriately will improve, Ability to verbalize feelings will improve, and Ability to disclose and discuss suicidal ideas  Medication Management: RN will administer medications as ordered by provider, will assess and evaluate patient's response and provide education to patient for prescribed medication. RN will report any adverse and/or side effects to prescribing provider.  Therapeutic Interventions: 1 on 1 counseling sessions, Psychoeducation, Medication administration, Evaluate responses to treatment, Monitor vital signs and CBGs as ordered, Perform/monitor CIWA, COWS, AIMS and Fall Risk screenings as ordered, Perform wound care treatments as ordered.  Evaluation of Outcomes: Progressing   LCSW Treatment Plan for Primary Diagnosis: Substance-induced psychotic disorder with delusions (HCC) Long Term Goal(s): Safe transition to appropriate next level of care at discharge, Engage patient in therapeutic group addressing interpersonal concerns.  Short Term Goals: Engage patient in aftercare planning with referrals and resources, Increase ability to appropriately verbalize feelings, Facilitate acceptance of mental health diagnosis and concerns, and Identify triggers associated with mental health/substance abuse issues  Therapeutic  Interventions: Assess for all discharge needs, 1 to 1 time with Social worker, Explore available resources and support systems, Assess for adequacy in community support network, Educate family and significant other(s) on suicide prevention, Complete Psychosocial Assessment, Interpersonal group therapy.  Evaluation of Outcomes: Progressing   Progress in Treatment: Attending groups: Yes Participating in groups: Yes Taking medication as prescribed: Yes. Toleration medication: Yes. Family/Significant other contact made: Yes, contacted [name] (mom) [phone]  Patient understands diagnosis: No. Discussing patient identified problems/goals with staff: Yes. Medical problems stabilized or resolved: Yes. Denies suicidal/homicidal ideation: Yes. Issues/concerns per patient self-inventory: No.   New problem(s) identified: No, Describe:  none   New Short Term/Long Term Goal(s): medication stabilization, elimination of SI thoughts, development of comprehensive mental wellness plan.      Patient Goals:  Get on the right meds and find placement   Discharge Plan or Barriers: Patient recently admitted. CSW will continue to follow and assess for appropriate referrals and possible discharge planning.      Reason for Continuation of Hospitalization: Hallucinations Medication stabilization Suicidal ideation   Estimated Length of Stay:  3 - 4 days  Last 3 [place] Suicide Severity Risk Score: Flowsheet Row [facility]Current[facility] Most recent reading at 08/17/2023  1:30 PM ED from 08/13/2023 in [facility] Emergency Department at [facility] Most recent reading  at 08/13/2023 10:01 AM ED from 08/13/2023 in [facility] Emergency Department at [facility] Most recent reading at 08/13/2023  6:37 AM  C-SSRS RISK CATEGORY High Risk No Risk No Risk    Last PHQ 2/9 Scores:    08/10/2023    3:03 PM  Depression screen PHQ 2/9   Decreased Interest 1  Down, Depressed, Hopeless 1  PHQ - 2 Score 2  Altered sleeping 0  Tired, decreased energy 1  Change in appetite 0  Feeling bad or failure about yourself  1  Trouble concentrating 1  Moving slowly or fidgety/restless 0  Suicidal thoughts 0  PHQ-9 Score 5    Scribe for Treatment Team: Raywood Wailes O Dniyah Grant, LCSWA 08/23/2023 5:08 PM
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
## 2023-08-23 NOTE — [facility] Suicide Risk Assessment (Signed)
 [facility] INPATIENT:  Family/Significant Other Suicide Prevention Education  Suicide Prevention Education:  Education Completed; [name] (mom) [phone] ,  (name of family member/significant other) has been identified by the patient as the family member/significant other with whom the patient will be residing, and identified as the person(s) who will aid the patient in the event of a mental health crisis (suicidal ideations/suicide attempt).  With written consent from the patient, the family member/significant other has been provided the following suicide prevention education, prior to the and/or following the discharge of the patient.  Mom said patient can return to her home upon discharge.  She is not available to pick him up, and requested taxi for patient.  Mom said they don't have any guns or weapons.  She said she will secure medications and sharp objects (such as knives and scissors).  She didn't have any safety concerns about patient being discharged.  The suicide prevention education provided includes the following: Suicide risk factors Suicide prevention and interventions National Suicide Hotline telephone number [facility] assessment telephone number [facility] Emergency Assistance 911 [facility] and/or Residential Mobile Crisis Unit telephone number  Request made of family/significant other to: Remove weapons (e.g., guns, rifles, knives), all items previously/currently identified as safety concern.   Remove drugs/medications (over-the-counter, prescriptions, illicit drugs), all items previously/currently identified as a safety concern.  The family member/significant other verbalizes understanding of the suicide prevention education information provided.  The family member/significant other agrees to remove the items of safety concern listed above.  [name] O Jacklynn Dehaas, LCSWA 08/23/2023, 2:58 PM

## 2023-08-23 NOTE — Plan of Care (Signed)
   Problem: Education: Goal: Knowledge of [facility] Education information/materials will improve Outcome: Progressing Goal: Emotional status will improve Outcome: Progressing Goal: Mental status will improve Outcome: Progressing Goal: Verbalization of understanding the information provided will improve Outcome: Progressing

## 2023-08-23 NOTE — Group Note (Signed)
 Therapy Group Note  Group Topic:Other  Group Date: 08/23/2023 Start Time: 1500 End Time: 1535 Facilitators: Dot Dallas MATSU, OT    The primary objective of this topic is to explore and understand the concept of occupational balance in the context of daily living. The term occupational balance is defined broadly, encompassing all activities that occupy an individual's time and energy, including self-care, leisure, and work-related tasks. The goal is to guide participants towards achieving a harmonious blend of these activities, tailored to their personal values and life circumstances. This balance is aimed at enhancing overall well-being, not by equally distributing time across activities, but by ensuring that daily engagements are fulfilling and not draining. The content delves into identifying various barriers that individuals face in achieving occupational balance, such as overcommitment, misaligned priorities, external pressures, and lack of effective time management. The impact of these barriers on occupational performance, roles, and lifestyles is examined, highlighting issues like reduced efficiency, strained relationships, and potential health problems. Strategies for cultivating occupational balance are a key focus. These strategies include practical methods like time blocking, prioritizing tasks, establishing self-care rituals, decluttering, connecting with nature, and engaging in reflective practices. These approaches are designed to be adaptable and applicable to a wide range of life scenarios, promoting a proactive and mindful approach to daily living. The overall aim is to equip participants with the knowledge and tools to create a balanced lifestyle that supports their mental, emotional, and physical health, thereby improving their functional performance in daily life.   D[place] Dot, OT     Participation Level: Engaged   Participation Quality: Independent   Behavior:  Appropriate   Speech/Thought Process: Loose association    Affect/Mood: Appropriate   Insight: Limited   Judgement: Limited      Modes of Intervention: Education  Patient Response to Interventions:  Attentive   Plan: Continue to engage patient in OT groups 2 - 3x/week.  08/23/2023  Dallas MATSU Dot, OT   Shameek Nyquist, OT

## 2023-08-23 NOTE — Progress Notes (Signed)
(  Sleep Hours) -7.25 (Any PRNs that were needed, meds refused, or side effects to meds)- hydroxyzine  25mg  @ 2139, agitation protocol:  haldol  10mg  injection,benadryl  50mg  injection, ativan  2mg  injection @2119  (Any disturbances and when (visitation, over night)-see previous note regarding agitation protocol (Concerns raised by the patient)- none (SI/HI/AVH)- Denies all

## 2023-08-24 ENCOUNTER — Telehealth (HOSPITAL_COMMUNITY): Payer: Self-pay | Admitting: Pharmacy Technician

## 2023-08-24 ENCOUNTER — Other Ambulatory Visit (HOSPITAL_COMMUNITY): Payer: Self-pay

## 2023-08-24 MED ORDER — ARIPIPRAZOLE ER 400 MG IM SRER
300.0000 mg | Freq: Once | INTRAMUSCULAR | Status: AC
  Administered 2023-08-24 (×2): 300 mg via INTRAMUSCULAR

## 2023-08-24 MED ORDER — ARIPIPRAZOLE ER 400 MG IM SRER: 300.0000 mg | Freq: Once | INTRAMUSCULAR | Status: DC

## 2023-08-24 MED ORDER — ARIPIPRAZOLE ER 400 MG IM SRER: 300.0000 mg | INTRAMUSCULAR | Status: DC

## 2023-08-24 NOTE — Progress Notes (Signed)
   08/24/23 1300  Psychosocial Assessment  Patient Complaints None  Eye Contact Fair  Facial Expression Flat  Affect Flat  Speech Slow;Logical/coherent  Interaction Assertive  Motor Activity Slow  Appearance/Hygiene Unremarkable  Behavior Characteristics Appropriate to situation;Cooperative  Mood Pleasant  Thought Process  Coherency WDL  Content WDL  Delusions None reported or observed  Perception WDL  Hallucination None reported or observed  Judgment Poor  Confusion None  Danger to Self  Current suicidal ideation? Denies  Danger to Others  Danger to Others None reported or observed

## 2023-08-24 NOTE — Plan of Care (Signed)
   Problem: Education: Goal: Emotional status will improve Outcome: Progressing Goal: Mental status will improve Outcome: Progressing Goal: Verbalization of understanding the information provided will improve Outcome: Progressing

## 2023-08-24 NOTE — Group Note (Signed)
 LCSW Group Therapy Note   Group Date: 08/24/2023 Start Time: 1100 End Time: 1200   Participation:  did not attend  Type of Therapy:  Group Therapy   Topic:  Lifestyle:  from "One Day" to "Today is Day One"  Objective:  To promote mental and physical well-being through lifestyle changes in routine, nutrition, sleep, and movement.  Goals: Increase awareness of how lifestyle habits impact mental health. Encourage one small, achievable wellness goal. Support group sharing and accountability.  Summary:  Group members explored how daily habits influence mental health and discussed the importance of starting with small, manageable changes. Participants identified personal goals and shared reflections on improving structure, sleep, diet, and physical activity.  Therapeutic Modalities: CBT - Identifying and challenging all-or-nothing thinking; promoting realistic, helpful thoughts about change. Psychoeducation - Teaching about the impact of sleep, nutrition, movement, and routine on mental health. Motivational Interviewing - Eliciting personal motivation and exploring readiness for change. Goal-Setting - Supporting SMART goals to build self-efficacy and encourage follow-through.   [name] O [name], LCSWA 08/24/2023  7:00 PM
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
## 2023-08-24 NOTE — Progress Notes (Addendum)
 Collateral contact - [name] (mom) [phone]   CSW informed mom that patient will be discharged on 08/25/2023 at 11 AM, and will be home when patient arrives.  Mom said she will be home.  Mom provided her address:  [address], [place], [place].   Deangela Randleman, LCSWA 08/24/2023

## 2023-08-24 NOTE — Telephone Encounter (Signed)
 Patient Product/process development scientist completed.    The patient is insured through [employer]. Patient has Medicare and is not eligible for a copay card, but may be able to apply for patient assistance or Medicare RX Payment Plan (Patient Must reach out to their plan, if eligible for payment plan), if available.    Ran test claim for Abilify  Maintena 300 mg syringe and the current 30 day co-pay is $4.80.   This test claim was processed through [facility]- copay amounts may vary at other pharmacies due to pharmacy/plan contracts, or as the patient moves through the different stages of their insurance plan.     Reyes Sharps, CPHT Pharmacy Technician III Certified Patient Advocate [facility] Patient Advocate Team Direct Number: [phone]  Fax: [phone]

## 2023-08-24 NOTE — [facility] Group Notes (Signed)
 Adult Psychoeducational Group Note  Date:  08/24/2023 Time:  9:39 AM  Group Topic/Focus:  Goals Group:   The focus of this group is to help patients establish daily goals to achieve during treatment and discuss how the patient can incorporate goal setting into their daily lives to aide in recovery. Orientation:   The focus of this group is to educate the patient on the purpose and policies of crisis stabilization and provide a format to answer questions about their admission.  The group details unit policies and expectations of patients while admitted.  Participation Level:  Did Not Attend  Participation Quality:    Affect:    Cognitive:    Insight:  Engagement in Group:    Modes of Intervention:    Additional Comments:    [name] O 08/24/2023, 9:39 AM

## 2023-08-24 NOTE — Care Management Important Message (Signed)
 Medicare IM printed and given to social work to give to the patient. ?

## 2023-08-24 NOTE — Progress Notes (Signed)
(  Sleep Hours) -8.75 (Any PRNs that were needed, meds refused, or side effects to meds)- Hydroxyzine  25mg  @2117  (Any disturbances and when (visitation, over night)-none (Concerns raised by the patient)- none (SI/HI/AVH)- Denies all

## 2023-08-24 NOTE — [facility] Group Notes (Signed)
 [facility] Group Notes:  (Nursing/MHT/Case Management/Adjunct)  Date:  08/24/2023  Time:  9:33 PM  Type of Therapy:  Psychoeducational Skills  Participation Level:  Active  Participation Quality:  Attentive  Affect:  Excited  Cognitive:  Alert  Insight:  Appropriate  Engagement in Group:  Developing/Improving  Modes of Intervention:  Education  Summary of Progress/Problems: Patient verbalized in group that he will be discharged tomorrow. He explained that he has family that will be picking him up at a set time. He has no questions regarding discharge planning.   Damein Gaunce S 08/24/2023, 9:33 PM

## 2023-08-25 MED ORDER — ABILIFY MAINTENA 300 MG IM PRSY: 300.0000 mg | PREFILLED_SYRINGE | INTRAMUSCULAR | 0 refills | Status: DC

## 2023-08-25 MED ORDER — ARIPIPRAZOLE 15 MG PO TABS: 15.0000 mg | ORAL_TABLET | Freq: Every morning | ORAL | 0 refills | Status: DC

## 2023-08-25 MED ORDER — NICOTINE POLACRILEX 2 MG MT GUM: 2.0000 mg | CHEWING_GUM | OROMUCOSAL | 0 refills | Status: DC | PRN

## 2023-08-25 MED ORDER — DIVALPROEX SODIUM 500 MG PO DR TAB: 500.0000 mg | DELAYED_RELEASE_TABLET | Freq: Every day | ORAL | 0 refills | Status: DC

## 2023-08-25 NOTE — Discharge Summary (Signed)
 Physician Discharge Summary Note  Patient:  [name] is an 33 y.o., male MRN:  [id number] DOB:  October 26, 1990 Patient phone:  [phone] (home)  Patient address:   [address] [place] [address],  Total Time spent with patient: 1 hour  Date of Admission:  08/17/2023 Date of Discharge: 08/25/2023  Reason for Admission:  command auditory hallucinations; paranoid delusions  Principal Problem: Substance-induced psychotic disorder with delusions (HCC) Discharge Diagnoses: Principal Problem:   Substance-induced psychotic disorder with delusions (HCC) Active Problems:   Stimulant use disorder   Intellectual developmental disorder, mild   Past Psychiatric History:  Previous psychiatric diagnoses: schizophrenia, substance-induced mood disorder, cocaine use disorder, cannabis use disorder, malingering Prior psychiatric treatment: Per chart review: Abilify , Depakote  and Haldol  decanoate, clozaril , lithium , olanzapine , thorazine, geodon , quetiapine , risperidone Psychiatric medication compliance history: Has not been compliant.   Current psychiatric treatment: Most recent med regimen was clozaril , abilify , lithium  Current psychiatrist: None Current therapist: None   Previous hospitalizations: Yes, multiple History of suicide attempts: Yes, often impulsive; Per chart review: cut wrist 2020; 2020 overdose one trazodone ; 2015 via jumping off a bridge- hospitalized for 3 days due to injuries; 2012 (jumped out in front of car), and 2011 (overdose on Depakote )   History of self harm: Yes, head banging, cutting   Past Medical History:  Past Medical History:  Diagnosis Date   B12 deficiency 05/27/2023   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 No pertinent surgical history. Family History: History reviewed. No pertinent family history. Family Psychiatric  History: Unknown Social History:  Social History    Substance and Sexual Activity  Alcohol Use Not Currently     Social History   Substance and Sexual Activity  Drug Use Yes   Types: Cocaine, Marijuana   Comment: crack 06-26-19    Social History   Socioeconomic History   Marital status: Single    Spouse name: Not on file   Number of children: Not on file   Years of education: Not on file   Highest education level: Not on file  Occupational History   Not on file  Tobacco Use   Smoking status: Never   Smokeless tobacco: Never  Substance and Sexual Activity   Alcohol use: Not Currently   Drug use: Yes    Types: Cocaine, Marijuana    Comment: crack 06-26-19   Sexual activity: Yes  Other Topics Concern   Not on file  Social History Narrative   Not on file   Social Drivers of Health   Financial Resource Strain: Not on file  Food Insecurity: No Food Insecurity (08/17/2023)   Hunger Vital Sign    Worried About Running Out of Food in the Last Year: Never true    Ran Out of Food in the Last Year: Never true  Recent Concern: Food Insecurity - Food Insecurity Present (07/27/2023)   Hunger Vital Sign    Worried About Running Out of Food in the Last Year: Sometimes true    Ran Out of Food in the Last Year: Sometimes true  Transportation Needs: Unmet Transportation Needs (08/17/2023)   PRAPARE - Administrator, Civil Service (Medical): Yes    Lack of Transportation (Non-Medical): Yes  Physical Activity: Not on file  Stress: Not on file  Social Connections: Unknown (08/17/2023)   Social Connection and Isolation Panel    Frequency of Communication with Friends and Family: Once a week  Frequency of Social Gatherings with Friends and Family: Once a week    Attends Religious Services: Never    Database administrator or Organizations: No    Attends Banker Meetings: Never    Marital Status: Patient declined    Hospital Course:   During the patient's hospitalization, patient had extensive initial  psychiatric evaluation, and follow-up psychiatric evaluations every day.   Psychiatric diagnoses provided upon initial assessment: Substance-induced psychosis; cocaine use disorder; mild intellectual disability   Patient's psychiatric medications were adjusted during admission: Patient's home clozapine  and lithium  were tapered and discontinued due to patient non-adherence with meds and monitoring requirements. Patient was started on oral Abilify , which was titrated to 15 mg daily. Patient received Abilify  Maintena 300 mg q28 day injection on 08/24/23. His next injection is due 08/21/23. Patient was started on Depakote  500 mg for mood stabilization on 08/23/23. Patient tolerated medications well with good effect overall.  Gradually, patient started adjusting to milieu. The patient was evaluated each day by a clinical provider to ascertain response to treatment. Improvement was noted by the patient's report of decreasing symptoms, improved sleep and appetite, affect, medication tolerance, behavior, and participation in unit programming.  Patient was asked each day to complete a self inventory noting mood, mental status, pain, new symptoms, anxiety and concerns.  Symptoms were reported as significantly decreased or resolved completely by discharge. The patient reports that their mood is stable. The patient denied having suicidal thoughts for more than 48 hours prior to discharge.  Patient denies having homicidal thoughts.  Patient denies having auditory hallucinations.  Patient denies any visual hallucinations or other symptoms of psychosis. The patient was motivated to continue taking medication with a goal of continued improvement in mental health.   The patient reports their target psychiatric symptoms of command auditory hallucinations and paranoid delusions responded well to the psychiatric medications, and the patient reports overall benefit other psychiatric hospitalization. Supportive psychotherapy was  provided to the patient. The patient also participated in regular group therapy while hospitalized. Coping skills, problem solving as well as relaxation therapies were also part of the unit programming.  Labs were reviewed with the patient, and abnormal results were discussed with the patient.  The patient is able to verbalize their individual safety plan to this provider.  # It is recommended to the patient to continue psychiatric medications as prescribed, after discharge from the hospital.    # It is recommended to the patient to follow up with your outpatient psychiatric provider and PCP.  # It was discussed with the patient, the impact of alcohol, drugs, tobacco have been there overall psychiatric and medical wellbeing, and total abstinence from substance use was recommended the patient.ed.  # Prescriptions provided or sent directly to preferred pharmacy at discharge. Patient agreeable to plan. Given opportunity to ask questions. Appears to feel comfortable with discharge.    # In the event of worsening symptoms, the patient is instructed to call the crisis hotline, 911 and or go to the nearest ED for appropriate evaluation and treatment of symptoms. To follow-up with primary care provider for other medical issues, concerns and or health care needs  # Patient was discharged to mother's house with a plan to follow up as noted below.  On day of discharge, patient is pleasant and calm. His thought process is linear and organized. He denies SI/HI/AVH. He denies medication side effects, and is tolerating Abilify  and Depakote  well. Patient does report some mild stiffness of right  shoulder, but otherwise no EPS noted on physical exam. Informed patient he will need to take oral Abilify  for an additional two weeks. He will need to get next Abilify  Maintena shot on 09/21/23. Informed patient that he will need to get a Depakote  level checked on 08/27/23 at his PCP's office.    Physical Findings: AIMS:   , ,  ,  ,  ,  ,   CIWA:    COWS:     Musculoskeletal: Strength & Muscle Tone: within normal limits Gait & Station: normal Patient leans: N/A   Psychiatric Specialty Exam:  Presentation  General Appearance:  Appropriate for Environment  Eye Contact: Good  Speech: Normal Rate; Clear and Coherent  Speech Volume: Normal  Handedness: Right   Mood and Affect  Mood: Euthymic  Affect: Appropriate; Full Range; Congruent   Thought Process  Thought Processes: Coherent; Goal Directed; Linear  Descriptions of Associations:Intact  Orientation:Full (Time, Place and Person)  Thought Content:WDL  History of Schizophrenia/Schizoaffective disorder:Yes  Duration of Psychotic Symptoms:Greater than six months  Hallucinations:Hallucinations: None  Ideas of Reference:None  Suicidal Thoughts:Suicidal Thoughts: No  Homicidal Thoughts:Homicidal Thoughts: No   Sensorium  Memory: Immediate Fair; Recent Fair  Judgment: Fair  Insight: Fair   Art therapist  Concentration: Fair  Attention Span: Good  Recall: [employer] of Knowledge: Fair  Language: Fair   Psychomotor Activity  Psychomotor Activity: Psychomotor Activity: Normal   Assets  Assets: Social Support   Sleep  Sleep: Sleep: Good  Estimated Sleeping Duration (Last 24 Hours): 3.50-3.75 hours   Physical Exam: Physical Exam Vitals and nursing note reviewed.  Constitutional:      General: He is not in acute distress.    Appearance: Normal appearance. He is normal weight. He is not ill-appearing.  HENT:     Head: Normocephalic and atraumatic.  Pulmonary:     Effort: Pulmonary effort is normal. No respiratory distress.  Neurological:     General: No focal deficit present.     Mental Status: He is alert and oriented to person, place, and time. Mental status is at baseline.    Review of Systems  Gastrointestinal:  Negative for abdominal pain, constipation, diarrhea, nausea  and vomiting.  Neurological:  Negative for dizziness and headaches.   Blood pressure 118/75, pulse 76, temperature 98.1 F (36.7 C), temperature source Oral, resp. rate 16, height 5' 10 (1.778 m), weight 77 kg, SpO2 100%. Body mass index is 24.36 kg/m.   Social History   Tobacco Use  Smoking Status Never  Smokeless Tobacco Never   Tobacco Cessation:  A prescription for an FDA-approved tobacco cessation medication provided at discharge   Blood Alcohol level:  Lab Results  Component Value Date   [facility] <15 08/13/2023   ETH <15 08/08/2023    Metabolic Disorder Labs:  Lab Results  Component Value Date   HGBA1C 4.9 07/29/2023   MPG 93.93 07/29/2023   No results found for: PROLACTIN Lab Results  Component Value Date   CHOL 139 07/29/2023   TRIG 106 07/29/2023   HDL 56 07/29/2023   CHOLHDL 2.5 07/29/2023   VLDL 21 07/29/2023   LDLCALC 62 07/29/2023   LDLCALC 62 11/30/2018    See Psychiatric Specialty Exam and Suicide Risk Assessment completed by Attending Physician prior to discharge.  Discharge destination:  Home  Is patient on multiple antipsychotic therapies at discharge:  No   Has Patient had three or more failed trials of antipsychotic monotherapy by history:  No  Recommended  Plan for Multiple Antipsychotic Therapies: NA  Discharge Instructions     Diet - low sodium heart healthy   Complete by: As directed    Increase activity slowly   Complete by: As directed    No wound care   Complete by: As directed       Allergies as of 08/25/2023       Reactions   Ibuprofen Swelling   Tongue swelling   Risperidone And Paliperidone Other (See Comments), Swelling   gynecomastia gynecomastia   Ziprasidone  Swelling   Tongue swells   Benztropine  Other (See Comments)   Causes confusion, depression, and delusions   Quetiapine  Other (See Comments)   Depression, suicidality, adverse effect: seizures         Medication List     STOP taking these medications     senna 8.6 MG Tabs tablet Commonly known as: SENOKOT       TAKE these medications      Indication  ARIPiprazole  15 MG tablet Commonly known as: ABILIFY  Take 1 tablet (15 mg total) by mouth in the morning. Start taking on: August 26, 2023  Indication: Schizophrenia   Abilify  Maintena 300 MG Prsy prefilled syringe Generic drug: ARIPiprazole  ER Inject 300 mg into the muscle every 28 (twenty-eight) days. Start taking on: September 21, 2023  Indication: Schizophrenia   divalproex  500 MG DR tablet Commonly known as: DEPAKOTE  Take 1 tablet (500 mg total) by mouth at bedtime.  Indication: Schizophrenia   nicotine  polacrilex 2 MG gum Commonly known as: NICORETTE  Take 1 each (2 mg total) by mouth as needed for smoking cessation.  Indication: Nicotine  Addiction        Follow-up Information     [facility]. Go on 09/01/2023.   Why: You have a hospital follow up appointment on 09/01/23 at 10:00 am .  The appointment will be held in person.  Following this appointment you will be scheduled for a clinical assessment, to obtain therapy and medication management services   * Please call to cancel if you are unable to attend. * Contact information: [address] [place] [address] [phone]         [facility] Follow up.   Specialty: Addiction Medicine Why: Referral made Contact information: [address] [place] [address] [phone]         [facility] Follow up.   Why: As we discussed, if you are interested in receiving services from an Assertive Community Treatment Team (ACTT) after discharge, you will need to have an active Medicaid coverage.  Please contact your local Department of Social Services (DSS) to apply.  For ACTT services, you may contact [facility], [address] [place], [place] [address], [phone], or [facility], [address],  [place], [place] [address], [phone]. Contact information: [address] [place], [place] [address] [phone]                Follow-up recommendations:   Activity: as tolerated   Diet: heart healthy   Other: -Follow-up with your outpatient psychiatric provider -instructions on appointment date, time, and address (location) are provided to you in discharge paperwork.   -Take your psychiatric medications as prescribed at discharge - instructions are provided to you in the discharge paperwork   -Follow-up with outpatient primary care doctor and other specialists -for management of chronic medical disease, including: N/A   -Testing: Follow-up with outpatient provider for abnormal  lab results: Hgb 12.5, K 3.4   -Recommend abstinence from alcohol, tobacco, and other illicit drug use at discharge.    -If your psychiatric symptoms recur, worsen, or if you have side effects to your psychiatric medications, call your outpatient psychiatric provider, 911, 988 or go to the nearest emergency department.   -If suicidal thoughts recur, call your outpatient psychiatric provider, 911, 988 or go to the nearest emergency department.   Signed: Ashley LOISE Gravely, MD 08/25/2023, 9:11 AM PGY-1

## 2023-08-25 NOTE — Plan of Care (Signed)

## 2023-08-25 NOTE — [facility] Suicide Risk Assessment (Signed)
 Suicide Risk Assessment  Discharge Assessment    [facility] Discharge Suicide Risk Assessment   Principal Problem: Substance-induced psychotic disorder with delusions [facility]) Discharge Diagnoses: Principal Problem:   Substance-induced psychotic disorder with delusions (HCC) Active Problems:   Stimulant use disorder   Intellectual developmental disorder, mild   Total Time spent with patient: 1 hour  During the patient's hospitalization, patient had extensive initial psychiatric evaluation, and follow-up psychiatric evaluations every day.  Psychiatric diagnoses provided upon initial assessment: Substance-induced psychosis; cocaine use disorder; mild intellectual disability  Patient's psychiatric medications were adjusted during admission: Patient's home clozapine  and lithium  were tapered and discontinued due to patient non-adherence with meds and monitoring requirements. Patient was started on oral Abilify , which was titrated to 15 mg daily. Patient received Abilify  Maintena 300 mg q28 day injection on 08/24/23. Patient was started on Depakote  500 mg for mood stabilization. Patient tolerated medications well with good effect overall.  Gradually, patient started adjusting to milieu.   Patient's care was discussed during the interdisciplinary team meeting every day during the hospitalization.  The patient reports their target psychiatric symptoms of command auditory hallucinations and paranoid delusions responded well to the psychiatric medications, and the patient reports overall benefit other psychiatric hospitalization. Supportive psychotherapy was provided to the patient. The patient also participated in regular group therapy while admitted.   Labs were reviewed with the patient, and abnormal results were discussed with the patient.  The patient denied having suicidal thoughts more than 48 hours prior to discharge.  Patient denies having homicidal thoughts.  Patient denies having auditory  hallucinations.  Patient denies any visual hallucinations.  Patient denies having paranoid thoughts.  The patient is able to verbalize their individual safety plan to this provider.  It is recommended to the patient to continue psychiatric medications as prescribed, after discharge from the hospital.    It is recommended to the patient to follow up with your outpatient psychiatric provider and PCP.  Discussed with the patient, the impact of alcohol, drugs, tobacco have been there overall psychiatric and medical wellbeing, and total abstinence from substance use was recommended the patient.   Musculoskeletal: Strength & Muscle Tone: within normal limits Gait & Station: normal Patient leans: N/A  Psychiatric Specialty Exam  Presentation  General Appearance:  Appropriate for Environment  Eye Contact: Good  Speech: Normal Rate; Clear and Coherent  Speech Volume: Normal  Handedness: Right   Mood and Affect  Mood: Euthymic   Affect: Appropriate; Full Range; Congruent   Thought Process  Thought Processes: Coherent; Goal Directed; Linear  Descriptions of Associations:Intact  Orientation:Full (Time, Place and Person)  Thought Content:WDL  History of Schizophrenia/Schizoaffective disorder:Yes  Duration of Psychotic Symptoms:Greater than six months  Hallucinations:Hallucinations: None  Ideas of Reference:None  Suicidal Thoughts:Suicidal Thoughts: No  Homicidal Thoughts:Homicidal Thoughts: No   Sensorium  Memory: Immediate Fair; Recent Fair  Judgment: Fair  Insight: Fair   Art therapist  Concentration: Fair  Attention Span: Good  Recall: [employer] of Knowledge: Fair  Language: Fair   Psychomotor Activity  Psychomotor Activity:Psychomotor Activity: Normal   Assets  Assets: Social Support   Sleep  Sleep:Sleep: Good  Estimated Sleeping Duration (Last 24 Hours): 3.50-3.75 hours  Physical Exam: Physical Exam Vitals  and nursing note reviewed.  Constitutional:      General: He is not in acute distress.    Appearance: Normal appearance. He is normal weight. He is not ill-appearing.  HENT:     Head: Normocephalic and atraumatic.  Pulmonary:  Effort: Pulmonary effort is normal. No respiratory distress.  Neurological:     General: No focal deficit present.     Mental Status: He is alert and oriented to person, place, and time. Mental status is at baseline.    Review of Systems  Gastrointestinal:  Negative for abdominal pain, constipation, diarrhea, nausea and vomiting.  Neurological:  Negative for dizziness and headaches.   Blood pressure 118/75, pulse 76, temperature 98.1 F (36.7 C), temperature source Oral, resp. rate 16, height 5' 10 (1.778 m), weight 77 kg, SpO2 100%. Body mass index is 24.36 kg/m.  Mental Status Per Nursing Assessment::   On Admission:  Suicidal ideation indicated by patient, Self-harm behaviors, Intention to act on suicide plan, Intention to act on plan to harm others  Demographic Factors:  Male and Unemployed  Loss Factors: NA  Historical Factors: Prior suicide attempts and Impulsivity  Risk Reduction Factors:   Living with another person, especially a relative  Continued Clinical Symptoms:  Alcohol/Substance Abuse/Dependencies  Cognitive Features That Contribute To Risk:  None    Suicide Risk:  Minimal: No identifiable suicidal ideation. Patients presenting with no risk factors but with morbid ruminations; may be classified as minimal risk based on the severity of the depressive symptoms   Follow-up Information     [facility]. Go on 09/01/2023.   Why: You have a hospital follow up appointment on 09/01/23 at 10:00 am .  The appointment will be held in person.  Following this appointment you will be scheduled for a clinical assessment, to obtain therapy and medication management services   * Please call to cancel if you are unable to attend.  * Contact information: [address] [place] [address] [phone]         [facility] Follow up.   Specialty: Addiction Medicine Why: Referral made Contact information: [address] [place] [address] [phone]                 Plan Of Care/Follow-up recommendations:  Activity: as tolerated  Diet: heart healthy  Other: -Follow-up with your outpatient psychiatric provider -instructions on appointment date, time, and address (location) are provided to you in discharge paperwork.  -Take your psychiatric medications as prescribed at discharge - instructions are provided to you in the discharge paperwork  -Follow-up with outpatient primary care doctor and other specialists -for management of chronic medical disease, including: N/A  -Testing: Follow-up with outpatient provider for abnormal lab results: Hgb 12.5, K 3.4  -Recommend abstinence from alcohol, tobacco, and other illicit drug use at discharge.   -If your psychiatric symptoms recur, worsen, or if you have side effects to your psychiatric medications, call your outpatient psychiatric provider, 911, 988 or go to the nearest emergency department.  -If suicidal thoughts recur, call your outpatient psychiatric provider, 911, 988 or go to the nearest emergency department.   Ashley LOISE Gravely, MD 08/25/2023, 8:12 AM PGY-1

## 2023-08-25 NOTE — Progress Notes (Signed)
  [facility] Adult Case Management Discharge Plan :  Will you be returning to the same living situation after discharge:  Yes,  patient will return to his mom's home, [name] At discharge, do you have transportation home?: Yes,  CSW arranged transportation through [facility] for 11 AM Do you have the ability to pay for your medications: Yes,  patient has insurance  Release of information consent forms completed and in the chart;  Patient's signature needed at discharge.  Patient to Follow up at:  Follow-up Information     [facility], [facility]. Go on 09/01/2023.   Why: You have a hospital follow up appointment on 09/01/23 at 10:00 am .  The appointment will be held in person.  Following this appointment you will be scheduled for a clinical assessment, to obtain therapy and medication management services   * Please call to cancel if you are unable to attend. * Contact information: [address] [place] [address] [phone]         [facility] Follow up.   Specialty: Addiction Medicine Why: Referral made Contact information: [address] [place] [address] [phone]         [facility] Social Services Follow up.   Why: As we discussed, if you are interested in receiving services from an Assertive Community Treatment Team (ACTT) after discharge, you will need to have an active Medicaid coverage.  Please contact your local Department of Social Services (DSS) to apply.  For ACTT services, you may contact [facility], [address] [place], [place] [address], [phone], or [facility], [address], [place], [place] [address], [phone]. Contact information: [address] [place], [place] [address] [phone]        [facility] Go on 09/27/2023.   Why: You have an appointment on 09/27/23 at 1:20 pm, in person for primary care services and your depakote  blood  levels. Contact information: [address] [place] [address] [phone]                 Next level of care provider has access to [facility] Link:no  Safety Planning and Suicide Prevention discussed: Yes,  [name] (mom) [phone]  Has patient been referred to the Quitline?: Patient does not use tobacco/nicotine  products  Patient has been referred for addiction treatment:  At admission, patient tested positive for cocaine and marijuana.  Patient wasn't interested in in-patient treatment at this time.  He said he will quit using, and will work on it in regular outpatient therapy.   Kamila Broda O Malakie Balis, LCSWA 08/25/2023, 9:54 AM

## 2023-08-25 NOTE — Care Management Important Message (Signed)
 Patient informed of right to appeal discharge, provided phone number to [facility]. Patient expressed no interest in appealing discharge at this time. CSW will continue to monitor situation.   Tavarious Freel, LCSWA 08/25/2023

## 2023-08-25 NOTE — Progress Notes (Signed)
 Patient discharged off unit at 1100. Patient belongings reviewed and acknowledged by patient. AVS and Transition Record reviewed and acknowledged by patient. Safety plan completed by patient and copy provided. Patient denies SI/HI/AVH. No observed or reported side effects to medication. No observed or reported agitation, aggression, or other acute emotional distress. No observed or reported physical abnormalities or concerns. Patient transportation from facility verified and observed.

## 2023-08-25 NOTE — BH Assessment (Signed)
(  Sleep Hours) - 4 (Any PRNs that were needed, meds refused, or side effects to meds)- Trazodone  50 mg PO (Any disturbances and when (visitation, over night)- None (Concerns raised by the patient)- None (SI/HI/AVH)- Denies

## 2023-08-25 NOTE — Transportation (Signed)
 08/25/2023  Courtney GORMAN [name] DOB: [birth year] MRN: [id number]   RIDER WAIVER AND RELEASE OF LIABILITY  For the purposes of helping with transportation needs, [place] partners with outside transportation providers (taxi companies, [place], Catering manager.) to give [employer] patients or other approved people the choice of on-demand rides Public librarian) to our buildings for non-emergency visits.  By using [employer], I, the person signing this document, on behalf of myself and/or any legal minors (in my care using the [employer]), agree:  Science writer given to me are supplied by independent, outside transportation providers who do not work for, or have any affiliation with, [employer]. [place] is not a transportation company. [facility] has no control over the quality or safety of the rides I get using [employer]. [place] has no control over whether any outside ride will happen on time or not. [place] gives no guarantee on the reliability, quality, safety, or availability on any rides, or that no mistakes will happen. I know and accept that traveling by vehicle (car, truck, SVU, fleeta, bus, taxi, etc.) has risks of serious injuries such as disability, being paralyzed, and death. I know and agree the risk of using [employer] is mine alone, and not [employer]. [employer] are provided as is and as are available. The transportation providers are in charge for all inspections and care of the vehicles used to provide these rides. I agree not to take legal action against [employer], its agents, employees, officers, directors, representatives, insurers, attorneys, assigns, successors, subsidiaries, and affiliates at any time for any reasons related directly or indirectly to using [employer]. I also agree not to take legal action against [place] or its affiliates for any injury, death, or damage to property caused by or related to using  [employer]. I have read this Waiver and Release of Liability, and I understand the terms used in it and their legal meaning. This Waiver is freely and voluntarily given with the understanding that my right (or any legal minors) to legal action against [place] relating to [employer] is knowingly given up to use these services.   I attest that I read the Ride Waiver and Release of Liability to Zaydenn S Myron, gave Mr. Blasdell the opportunity to ask questions and answered the questions asked (if any). I affirm that Brevon S Berling then provided consent for assistance with transportation.       Ahley Bulls, LCSWA 08/25/2023

## 2023-08-25 NOTE — Group Note (Signed)
 Date:  08/25/2023 Time:  11:22 AM  Group Topic/Focus:  Goals Group:   The focus of this group is to help patients establish daily goals to achieve during treatment and discuss how the patient can incorporate goal setting into their daily lives to aide in recovery.    Participation Level:  Active  Participation Quality:  Appropriate  Affect:  Appropriate  Cognitive:  Appropriate  Insight: Appropriate  Engagement in Group:  Engaged  Modes of Intervention:  Discussion  Additional Comments:  Pt goal is to go home and be with family after discharge on today. Pt coping skill is to just be quiet. And listen before speaking.  Cassius LOISE Dawn 08/25/2023, 11:22 AM

## 2023-08-25 NOTE — Plan of Care (Signed)

## 2023-08-25 NOTE — Progress Notes (Signed)
   08/24/23 2105  Psychosocial Assessment  Patient Complaints None  Eye Contact Fair  Facial Expression Animated  Affect Appropriate to circumstance  Speech Pressured  Interaction Arrogant  Motor Activity Other (Comment) (WNL)  Appearance/Hygiene Unremarkable  Behavior Characteristics Appropriate to situation  Mood Preoccupied  Thought Process  Coherency WDL  Content WDL  Delusions None reported or observed  Perception WDL  Hallucination None reported or observed  Judgment Poor  Confusion None  Danger to Self  Current suicidal ideation?  (Denies)  Agreement Not to Harm Self Yes  Description of Agreement Notify Staff  Danger to Others  Danger to Others None reported or observed

## 2023-08-25 NOTE — Group Note (Deleted)
 Date:  08/25/2023 Time:  3:31 AM  Group Topic/Focus:  Coping With Mental Health Crisis:   The purpose of this group is to help patients identify strategies for coping with mental health crisis.  Group discusses possible causes of crisis and ways to manage them effectively.     Participation Level:  {BHH PARTICIPATION OZCZO:77735}  Participation Quality:  {BHH PARTICIPATION QUALITY:22265}  Affect:  {BHH AFFECT:22266}  Cognitive:  {BHH COGNITIVE:22267}  Insight: {BHH Insight2:20797}  Engagement in Group:  {BHH ENGAGEMENT IN GROUP:22268}  Modes of Intervention:  {BHH MODES OF INTERVENTION:22269}  Additional Comments:  ***  [name] 08/25/2023, 3:31 AM

## 2023-08-28 ENCOUNTER — Other Ambulatory Visit: Payer: Self-pay

## 2023-08-28 ENCOUNTER — Emergency Department
Admission: EM | Admit: 2023-08-28 | Discharge: 2023-08-28 | Attending: Emergency Medicine | Admitting: Emergency Medicine

## 2023-08-28 ENCOUNTER — Emergency Department

## 2023-08-28 DIAGNOSIS — S0990XA Unspecified injury of head, initial encounter: Secondary | ICD-10-CM

## 2023-08-28 DIAGNOSIS — T148XXA Other injury of unspecified body region, initial encounter: Secondary | ICD-10-CM

## 2023-08-28 DIAGNOSIS — S0081XA Abrasion of other part of head, initial encounter: Secondary | ICD-10-CM | POA: Insufficient documentation

## 2023-08-28 MED ORDER — ACETAMINOPHEN 500 MG PO TABS: 1000.0000 mg | ORAL_TABLET | Freq: Once | ORAL | Status: DC
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
## 2023-08-28 NOTE — ED Notes (Signed)
 Pt transported to CT via stretcher. Pt remains in forensic restraints. [employer] officers X 2 remain with patient.
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
## 2023-09-11 ENCOUNTER — Emergency Department
Admission: EM | Admit: 2023-09-11 | Discharge: 2023-09-12 | Disposition: A | Discharge status: Other Acute Inpatient Hospital | Attending: Emergency Medicine | Admitting: Emergency Medicine

## 2023-09-11 ENCOUNTER — Other Ambulatory Visit: Payer: Self-pay

## 2023-09-11 DIAGNOSIS — F259 Schizoaffective disorder, unspecified: Secondary | ICD-10-CM | POA: Insufficient documentation

## 2023-09-11 DIAGNOSIS — F1994 Other psychoactive substance use, unspecified with psychoactive substance-induced mood disorder: Secondary | ICD-10-CM | POA: Insufficient documentation

## 2023-09-11 DIAGNOSIS — F1494 Cocaine use, unspecified with cocaine-induced mood disorder: Secondary | ICD-10-CM | POA: Diagnosis not present

## 2023-09-11 LAB — COMPREHENSIVE METABOLIC PANEL WITH GFR
ALT: 16 U/L (ref 0–44)
AST: 22 U/L (ref 15–41)
Albumin: 4.3 g/dL (ref 3.5–5.0)
Alkaline Phosphatase: 65 U/L (ref 38–126)
Anion gap: 11 (ref 5–15)
BUN: 16 mg/dL (ref 6–20)
CO2: 25 mmol/L (ref 22–32)
Calcium: 9.5 mg/dL (ref 8.9–10.3)
Chloride: 103 mmol/L (ref 98–111)
Creatinine, Ser: 0.75 mg/dL (ref 0.61–1.24)
GFR, Estimated: >60 mL/min (ref >60)
Glucose, Bld: 97 mg/dL (ref 70–99)
Potassium: 4 mmol/L (ref 3.5–5.1)
Sodium: 139 mmol/L (ref 135–145)
Total Bilirubin: 0.5 mg/dL (ref 0.0–1.2)
Total Protein: 7.8 g/dL (ref 6.5–8.1)

## 2023-09-11 LAB — LITHIUM LEVEL: Lithium Lvl: <0.1 mmol/L — ABNORMAL LOW (ref 0.60–1.20)

## 2023-09-11 LAB — CBC
HCT: 43.2 % (ref 39.0–52.0)
Hemoglobin: 14.6 g/dL (ref 13.0–17.0)
MCH: 29.3 pg (ref 26.0–34.0)
MCHC: 33.8 g/dL (ref 30.0–36.0)
MCV: 86.6 fL (ref 80.0–100.0)
Platelets: 296 K/uL (ref 150–400)
RBC: 4.99 MIL/uL (ref 4.22–5.81)
RDW: 13.2 % (ref 11.5–15.5)
WBC: 6.8 K/uL (ref 4.0–10.5)
nRBC: 0 % (ref 0.0–0.2)

## 2023-09-11 LAB — URINE DRUG SCREEN, QUALITATIVE (ARMC ONLY)
Amphetamines, Ur Screen: NOT DETECTED
Barbiturates, Ur Screen: NOT DETECTED
Benzodiazepine, Ur Scrn: NOT DETECTED
Cannabinoid 50 Ng, Ur ~~LOC~~: NOT DETECTED
Cocaine Metabolite,Ur ~~LOC~~: POSITIVE — AB
MDMA (Ecstasy)Ur Screen: NOT DETECTED
Methadone Scn, Ur: NOT DETECTED
Opiate, Ur Screen: NOT DETECTED
Phencyclidine (PCP) Ur S: NOT DETECTED
Tricyclic, Ur Screen: NOT DETECTED

## 2023-09-11 LAB — ETHANOL: Alcohol, Ethyl (B): <15 mg/dL (ref <15)

## 2023-09-11 LAB — VALPROIC ACID LEVEL: Valproic Acid Lvl: <10 ug/mL — ABNORMAL LOW (ref 50–100)

## 2023-09-11 NOTE — ED Notes (Signed)
Patient received lunch tray 

## 2023-09-11 NOTE — ED Provider Notes (Signed)
 The patient has been placed in psychiatric observation due to the need to provide a safe environment for the patient while obtaining psychiatric consultation and evaluation, as well as ongoing medical and medication management to treat the patient's condition.  The patient has been placed under full IVC at this time.    Dicky Anes, MD 09/11/23 [phone]

## 2023-09-11 NOTE — BH Assessment (Signed)
 Comprehensive Clinical Assessment (CCA) Note  09/11/2023 [name] [id number]  Chief Complaint:  Chief Complaint  Patient presents with   IVC   Visit Diagnosis: Schizoaffective Disorder   [name] is a 33 year old male who presents to the ER, after he was seen at [facility]). Patient reports, he went to [facility] to get help and started on medications. While there, he was sharing with them his belief about different planets and worlds. He shared he showed them on his cellular phone the different social media videos he's been watching about it. He believes that may be the reason he was brought to the ER. During the interview the patient was calm, cooperative and pleasant. He was able to provide appropriate answers to the questions. He denies SI/HI and AV/H.   CCA Screening, Triage and Referral (STR)  Patient Reported Information How did you hear about us ? Self  What Is the Reason for Your Visit/Call Today? Patient brought to the ER after he was seen at [facility]).  How Long Has This Been Causing You Problems? 1 wk - 1 month  What Do You Feel Would Help You the Most Today? Treatment for Depression or other mood problem   Have You Recently Had Any Thoughts About Hurting Yourself? No  Are You Planning to Commit Suicide/Harm Yourself At This time? No   Flowsheet Row ED from 09/11/2023 in [facility] Emergency Department at [facility] Admission (Discharged[facility] from 08/13/2023 in [facility] Emergency Department at [facility]  C-SSRS RISK CATEGORY Error: Q3, 4, or 5 should not be populated when Q2 is No High Risk No Risk    Have you Recently Had Thoughts About Hurting Someone [place]? No  Are You Planning to Harm Someone at This Time? No  Explanation: Pt reports having thoughts of SI; denied HI.   Have You Used Any Alcohol or Drugs in the Past 24 Hours? No  How  Long Ago Did You Use Drugs or Alcohol? UTA  What Did You Use and How Much? UTA   Do You Currently Have a Therapist/Psychiatrist? No  Name of Therapist/Psychiatrist:    Have You Been Recently Discharged From Any Office Practice or Programs? No  Explanation of Discharge From Practice/Program: No data recorded    CCA Screening Triage Referral Assessment Type of Contact: Face-to-Face  Telemedicine Service Delivery:   Is this Initial or Reassessment?   Date Telepsych consult ordered in [facility]:    Time Telepsych consult ordered in CHL:    Location of Assessment: [facility] ED  Provider Location: [facility] ED   Collateral Involvement: None provided   Does Patient Have a Court Appointed Legal Guardian? No  Legal Guardian Contact Information: N/A  Copy of Legal Guardianship Form: -- (N/A)  Legal Guardian Notified of Arrival: -- (N/A)  Legal Guardian Notified of Pending Discharge: -- (N/A)  If Minor and Not Living with Parent(s), Who has Custody? N/A  Is CPS involved or ever been involved? Never  Is APS involved or ever been involved? Never   Patient Determined To Be At Risk for Harm To Self or Others Based on Review of Patient Reported Information or Presenting Complaint? No  Method: No Plan  Availability of Means: No access or NA  Intent: Vague intent or NA  Notification Required: No need or identified person  Additional Information for Danger to Others Potential: -- (N/A)  Additional Comments for Danger to Others  Potential: N/A  Are There Guns or Other Weapons in Your Home? No  Types of Guns/Weapons: Denies access to guns/ weapons  Are These Weapons Safely Secured?                            No  Who Could Verify You Are Able To Have These Secured: Denies access to guns/ weapons  Do You Have any Outstanding Charges, Pending Court Dates, Parole/Probation? UTA  Contacted To Inform of Risk of Harm To Self or Others: -- (N/A)    Does Patient Present under Involuntary  Commitment? Yes    [place] of Residence: [place]   Patient Currently Receiving the Following Services: Not Receiving Services   Determination of Need: Emergent (2 hours)   Options For Referral: Inpatient Hospitalization; ED Visit     CCA Biopsychosocial Patient Reported Schizophrenia/Schizoaffective Diagnosis in Past: Yes   Strengths: Seeking help, polite and able to communicate his needs.   Mental Health Symptoms Depression:  None   Duration of Depressive symptoms:    Mania:  None   Anxiety:   None   Psychosis:  None   Duration of Psychotic symptoms:    Trauma:  None   Obsessions:  None   Compulsions:  None   Inattention:  None   Hyperactivity/Impulsivity:  None   Oppositional/Defiant Behaviors:  None   Emotional Irregularity:  None   Other Mood/Personality Symptoms:  None reported    Mental Status Exam Appearance and self-care  Stature:  Average   Weight:  Average weight   Clothing:  Age-appropriate   Grooming:  Normal   Cosmetic use:  None   Posture/gait:  Rigid   Motor activity:  -- (Within normal range)   Sensorium  Attention:  Normal   Concentration:  Normal   Orientation:  X5   Recall/memory:  Normal   Affect and Mood  Affect:  Appropriate   Mood:  Euthymic   Relating  Eye contact:  Normal   Facial expression:  Responsive   Attitude toward examiner:  Cooperative   Thought and Language  Speech flow: Clear and Coherent   Thought content:  Appropriate to Mood and Circumstances   Preoccupation:  None   Hallucinations:  None   Organization:  Coherent; Development worker, international aid of Knowledge:  Average   Intelligence:  Average   Abstraction:  Armed forces technical officer:  Fair   Dance movement psychotherapist:  Adequate   Insight:  Fair   Decision Making:  Impulsive   Social Functioning  Social Maturity:  Isolates   Social Judgement:  Normal   Stress  Stressors:  Family conflict   Coping Ability:  Deficient  supports   Skill Deficits:  None   Supports:  Family     Religion: Religion/Spirituality Are You A Religious Person?: No  Leisure/Recreation: Leisure / Recreation Do You Have Hobbies?: No  Exercise/Diet: Exercise/Diet Do You Exercise?: No Have You Gained or Lost A Significant Amount of Weight in the Past Six Months?: No Do You Follow a Special Diet?: No Do You Have Any Trouble Sleeping?: No   CCA Employment/Education Employment/Work Situation: Employment / Work Situation Employment Situation: Unemployed Has Patient ever Been in Equities trader?: No  Education: Education Is Patient Currently Attending School?: No Did You Have An Individualized Education Program (IIEP): No Did You Have Any Difficulty At [employer]?: No Patient's Education Has Been Impacted by Current Illness: No   CCA Family/Childhood History Family  and Relationship History: Family history Marital status: Single Does patient have children?: No  Childhood History:  Childhood History By whom was/is the patient raised?: Mother Did patient suffer any verbal/emotional/physical/sexual abuse as a child?: No Did patient suffer from severe childhood neglect?: No Has patient ever been sexually abused/assaulted/raped as an adolescent or adult?: No Was the patient ever a victim of a crime or a disaster?: No Witnessed domestic violence?: No Has patient been affected by domestic violence as an adult?: No       CCA Substance Use Alcohol/Drug Use: Alcohol / Drug Use Pain Medications: See MAR Prescriptions: See MAR Over the Counter: See MAR History of alcohol / drug use?: Yes Longest period of sobriety (when/how long): Unable to quantify Substance #1 Name of Substance 1: Cocaine    ASAM's:  Six Dimensions of Multidimensional Assessment  Dimension 1:  Acute Intoxication and/or Withdrawal Potential:      Dimension 2:  Biomedical Conditions and Complications:      Dimension 3:  Emotional, Behavioral, or  Cognitive Conditions and Complications:     Dimension 4:  Readiness to Change:     Dimension 5:  Relapse, Continued use, or Continued Problem Potential:     Dimension 6:  Recovery/Living Environment:     ASAM Severity Score:    ASAM Recommended Level of Treatment:     Substance use Disorder (SUD)    Recommendations for Services/Supports/Treatments:    Disposition Recommendation per psychiatric provider: Inpatient Treatment   DSM5 Diagnoses: Patient Active Problem List   Diagnosis Date Noted   Stimulant use disorder 08/18/2023   Intellectual developmental disorder, 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   Cocaine use 05/11/2023   Aggressive behavior 05/11/2023   Cocaine abuse (HCC) 05/18/2022   Schizoaffective disorder (HCC) 06/27/2019   Schizophrenia, paranoid (HCC) 03/12/2019   Cocaine abuse with intoxication (HCC)    Self-inflicted laceration of right wrist (HCC) 11/30/2018   Suicidal ideation 11/29/2018   Cocaine abuse with cocaine-induced mood disorder (HCC) 11/06/2018   Agitation    Cannabis abuse 11/10/2017     Referrals to Alternative Service(s): Referred to Alternative Service(s):   Place:   Date:   Time:    Referred to Alternative Service(s):   Place:   Date:   Time:    Referred to Alternative Service(s):   Place:   Date:   Time:    Referred to Alternative Service(s):   Place:   Date:   Time:     Kiki DOROTHA Barge MS, LCAS, [facility], [facility] Therapeutic Triage Specialist 09/11/2023 1:28 PM

## 2023-09-11 NOTE — ED Notes (Signed)
 Dr Dicky in triage evaluating pt.

## 2023-09-11 NOTE — ED Provider Notes (Signed)
 Patient still planning to transfer to [facility] health behavioral unit, but at this time [facility] transport not available until tomorrow as he is under IVC.   Dicky Anes, MD 09/11/23 [phone]

## 2023-09-11 NOTE — ED Provider Notes (Signed)
 The patient has been accepted in transfer for psychiatric care by Dr. Raliegh.  He will remain under IVC.  Anticipate transfer.  Patient resting comfortably stable hemodynamics without distress appropriate for transfer at this time   Greg Anes, MD 09/11/23 1336

## 2023-09-11 NOTE — ED Provider Notes (Signed)
 [facility] Provider Note    Event Date/Time   First MD Initiated Contact with Patient 09/11/23 [phone]     (approximate)   History   IVC  EM caveat poor historian, disorganized  HPI  [name] is a 33 y.o. male history of schizophrenia and substance induced mood disorder  Patient presented to [facility] for psychiatric evaluation.  He was placed under IVC and transferred here for further evaluation.   [name] reports no recent illness.  No pain.  He seems to indicate some suicidal ideation by nodding head yes but does not specifically speak to any plan.  He also goes on to say things that seem a bit out of line, or tangential or disorganized.  Flat affect  There is no notation of any acute overdose or ingestion  Physical Exam   Triage Vital Signs: ED Triage Vitals  Encounter Vitals Group     BP 09/11/23 0802 130/86     Girls Systolic BP Percentile --      Girls Diastolic BP Percentile --      Boys Systolic BP Percentile --      Boys Diastolic BP Percentile --      Pulse Rate 09/11/23 0802 95     Resp 09/11/23 0802 16     Temp 09/11/23 0802 98.2 F (36.8 C)     Temp Source 09/11/23 0802 Oral     SpO2 09/11/23 0802 97 %     Weight 09/11/23 0804 150 lb (68 kg)     Height 09/11/23 0804 5' 11 (1.803 m)     Head Circumference --      Peak Flow --      Pain Score 09/11/23 0802 0     Pain Loc --      Pain Education --      Exclude from Growth Chart --     Most recent vital signs: Vitals:   09/11/23 0802  BP: 130/86  Pulse: 95  Resp: 16  Temp: 98.2 F (36.8 C)  SpO2: 97%     General: Awake, no distress.  Seated comfortably in triage chair CV:  Good peripheral perfusion.  Normal tone Resp:  Normal effort.  Clear bilateral with normal work of breathing Abd:  No distention.  Other:  Normocephalic atraumatic.  Maintains eye contact at times but very flattened affect.   ED Results / Procedures / Treatments   Labs (all labs ordered  are listed, but only abnormal results are displayed) Labs Reviewed  URINE DRUG SCREEN, QUALITATIVE ([facility] ONLY) - Abnormal; Notable for the following components:      Result Value   Cocaine Metabolite,Ur [place] POSITIVE (*)    All other components within normal limits  COMPREHENSIVE METABOLIC PANEL WITH GFR  ETHANOL  CBC  LITHIUM  LEVEL  VALPROIC ACID  LEVEL     EKG     RADIOLOGY     PROCEDURES:  Critical Care performed: No  Procedures   MEDICATIONS ORDERED IN ED: Medications - No data to display   IMPRESSION / MDM / ASSESSMENT AND PLAN / ED COURSE  I reviewed the triage vital signs and the nursing notes.                              Differential diagnosis includes, but is not limited to, exacerbation of psychiatric disease, substance-induced, flattening of affect, etc.  He has been evaluated multiple times in the past with  similar symptoms.  He has no symptoms suggestive of acute underlying medical illness today but we will continue to evaluate and observe him while he is in the emergency department.  His screening labs are notable for no acute abnormalities except drug screen positive  cocaine  Patient's presentation is most consistent with acute complicated illness / injury requiring diagnostic workup.   Psychiatry consult has been placed.  CBC and metabolic panel interpreted as normal     FINAL CLINICAL IMPRESSION(S) / ED DIAGNOSES   Final diagnoses:  Substance induced mood disorder (HCC)     Rx / DC Orders   ED Discharge Orders     None        Note:  This document was prepared using Dragon voice recognition software and may include unintentional dictation errors.      Dicky Anes, MD 09/11/23 [phone]

## 2023-09-11 NOTE — ED Notes (Signed)
 Patient is IVC pending transport to [facility] on 09-12-23

## 2023-09-11 NOTE — ED Triage Notes (Signed)
 Pt to ED with [facility] under IVC. IVC papers state that pt has schizoaffective disorder and that he stated he can change his skin color if he thinks hard enough and he wants to go back to his home [place] and he is struggling with cosmic forces in his brain.  Pt denies drug use. Pt is cooperative in triage.  Pt denies SI and HI. Pt states I would just like to go back home, to heaven. And heaven is nice though, but it's transparent.

## 2023-09-11 NOTE — ED Notes (Signed)
 Pt is currently sleeping will assess when he wakes up.

## 2023-09-11 NOTE — ED Notes (Signed)
 Patient to B[facility] 5 from main ED.  Patient oriented to unit regarding bathroom, cameras and rounding.  Patient instructed to come to nsg station for any concerns.

## 2023-09-12 ENCOUNTER — Inpatient Hospital Stay (HOSPITAL_COMMUNITY)
Admission: AD | Admit: 2023-09-12 | Discharge: 2023-09-16 | DRG: 897 | Disposition: A | Discharge status: Home / Self Care | Source: Intra-hospital

## 2023-09-12 ENCOUNTER — Encounter (HOSPITAL_COMMUNITY): Payer: Self-pay | Admitting: Child & Adolescent Psychiatry

## 2023-09-12 ENCOUNTER — Other Ambulatory Visit: Payer: Self-pay

## 2023-09-12 DIAGNOSIS — Y9389 Activity, other specified: Secondary | ICD-10-CM

## 2023-09-12 DIAGNOSIS — R4588 Nonsuicidal self-harm: Secondary | ICD-10-CM | POA: Diagnosis not present

## 2023-09-12 DIAGNOSIS — F1290 Cannabis use, unspecified, uncomplicated: Secondary | ICD-10-CM | POA: Diagnosis present

## 2023-09-12 DIAGNOSIS — F209 Schizophrenia, unspecified: Secondary | ICD-10-CM | POA: Diagnosis present

## 2023-09-12 DIAGNOSIS — F19950 Other psychoactive substance use, unspecified with psychoactive substance-induced psychotic disorder with delusions: Principal | ICD-10-CM

## 2023-09-12 DIAGNOSIS — W2201XA Walked into wall, initial encounter: Secondary | ICD-10-CM | POA: Diagnosis present

## 2023-09-12 DIAGNOSIS — F17210 Nicotine dependence, cigarettes, uncomplicated: Secondary | ICD-10-CM | POA: Diagnosis present

## 2023-09-12 DIAGNOSIS — F1414 Cocaine abuse with cocaine-induced mood disorder: Principal | ICD-10-CM | POA: Diagnosis present

## 2023-09-12 DIAGNOSIS — F19150 Other psychoactive substance abuse with psychoactive substance-induced psychotic disorder with delusions: Principal | ICD-10-CM | POA: Diagnosis present

## 2023-09-12 DIAGNOSIS — Z5982 Transportation insecurity: Secondary | ICD-10-CM | POA: Diagnosis not present

## 2023-09-12 DIAGNOSIS — Z91148 Patient's other noncompliance with medication regimen for other reason: Secondary | ICD-10-CM

## 2023-09-12 DIAGNOSIS — F1210 Cannabis abuse, uncomplicated: Secondary | ICD-10-CM | POA: Diagnosis present

## 2023-09-12 DIAGNOSIS — F29 Unspecified psychosis not due to a substance or known physiological condition: Secondary | ICD-10-CM | POA: Diagnosis present

## 2023-09-12 DIAGNOSIS — Z9152 Personal history of nonsuicidal self-harm: Secondary | ICD-10-CM | POA: Diagnosis not present

## 2023-09-12 DIAGNOSIS — F1920 Other psychoactive substance dependence, uncomplicated: Secondary | ICD-10-CM | POA: Diagnosis not present

## 2023-09-12 DIAGNOSIS — F19250 Other psychoactive substance dependence with psychoactive substance-induced psychotic disorder with delusions: Secondary | ICD-10-CM | POA: Diagnosis not present

## 2023-09-12 DIAGNOSIS — Y92233 Cafeteria of hospital as the place of occurrence of the external cause: Secondary | ICD-10-CM | POA: Diagnosis present

## 2023-09-12 DIAGNOSIS — F79 Unspecified intellectual disabilities: Secondary | ICD-10-CM

## 2023-09-12 DIAGNOSIS — F1520 Other stimulant dependence, uncomplicated: Secondary | ICD-10-CM | POA: Diagnosis present

## 2023-09-12 DIAGNOSIS — R4586 Emotional lability: Secondary | ICD-10-CM | POA: Diagnosis present

## 2023-09-12 DIAGNOSIS — F70 Mild intellectual disabilities: Secondary | ICD-10-CM | POA: Diagnosis present

## 2023-09-12 DIAGNOSIS — F19959 Other psychoactive substance use, unspecified with psychoactive substance-induced psychotic disorder, unspecified: Secondary | ICD-10-CM | POA: Diagnosis not present

## 2023-09-12 DIAGNOSIS — F1494 Cocaine use, unspecified with cocaine-induced mood disorder: Secondary | ICD-10-CM | POA: Diagnosis present

## 2023-09-12 HISTORY — DX: Restlessness and agitation: R45.1

## 2023-09-12 MED ORDER — MAGNESIUM HYDROXIDE 400 MG/5ML PO SUSP: 30.0000 mL | Freq: Every day | ORAL | Status: DC | PRN

## 2023-09-12 MED ORDER — ALUM & MAG HYDROXIDE-SIMETH 200-200-20 MG/5ML PO SUSP: 30.0000 mL | ORAL | Status: DC | PRN

## 2023-09-12 MED ORDER — DIPHENHYDRAMINE HCL 50 MG/ML IJ SOLN
50.0000 mg | Freq: Three times a day (TID) | INTRAMUSCULAR | Status: DC | PRN
  Administered 2023-09-12 – 2023-09-14 (×2): 50 mg via INTRAMUSCULAR
  Filled 2023-09-12 (×2): qty 1

## 2023-09-12 MED ORDER — ARIPIPRAZOLE 15 MG PO TABS: 15.0000 mg | ORAL_TABLET | Freq: Every morning | ORAL | Status: DC

## 2023-09-12 MED ORDER — ACETAMINOPHEN 325 MG PO TABS
650.0000 mg | ORAL_TABLET | Freq: Four times a day (QID) | ORAL | Status: DC | PRN
Start: 2023-09-12 — End: 2023-09-12

## 2023-09-12 MED ORDER — DIVALPROEX SODIUM 500 MG PO DR TAB: 500.0000 mg | DELAYED_RELEASE_TABLET | Freq: Two times a day (BID) | ORAL | Status: DC

## 2023-09-12 MED ORDER — ARIPIPRAZOLE 10 MG PO TABS
20.0000 mg | ORAL_TABLET | Freq: Every day | ORAL | Status: DC
  Administered 2023-09-13 (×2): 20 mg via ORAL
  Filled 2023-09-12 (×2): qty 2

## 2023-09-12 MED ORDER — LORAZEPAM 2 MG/ML IJ SOLN
2.0000 mg | Freq: Three times a day (TID) | INTRAMUSCULAR | Status: DC | PRN
  Administered 2023-09-12 – 2023-09-14 (×2): 2 mg via INTRAMUSCULAR
  Filled 2023-09-12 (×2): qty 1

## 2023-09-12 MED ORDER — HALOPERIDOL LACTATE 5 MG/ML IJ SOLN
5.0000 mg | Freq: Three times a day (TID) | INTRAMUSCULAR | Status: DC | PRN
  Administered 2023-09-12 – 2023-09-14 (×2): 5 mg via INTRAMUSCULAR
  Filled 2023-09-12 (×2): qty 1

## 2023-09-12 MED ORDER — NICOTINE POLACRILEX 2 MG MT GUM
2.0000 mg | CHEWING_GUM | OROMUCOSAL | Status: DC | PRN
  Administered 2023-09-12 – 2023-09-16 (×10): 2 mg via ORAL
  Filled 2023-09-12 (×8): qty 1

## 2023-09-12 MED ORDER — DIVALPROEX SODIUM 500 MG PO DR TAB: 500.0000 mg | DELAYED_RELEASE_TABLET | Freq: Every day | ORAL | Status: DC

## 2023-09-12 MED ORDER — DIPHENHYDRAMINE HCL 25 MG PO CAPS
50.0000 mg | ORAL_CAPSULE | Freq: Three times a day (TID) | ORAL | Status: DC | PRN
Start: 2023-09-12 — End: 2023-09-14
  Administered 2023-09-13 – 2023-09-14 (×3): 50 mg via ORAL
  Filled 2023-09-12 (×2): qty 2

## 2023-09-12 MED ORDER — GUANFACINE HCL ER 1 MG PO TB24
1.0000 mg | ORAL_TABLET | Freq: Every day | ORAL | Status: DC
  Administered 2023-09-13 – 2023-09-15 (×4): 1 mg via ORAL
  Filled 2023-09-12: qty 7
  Filled 2023-09-12 (×4): qty 1

## 2023-09-12 MED ORDER — ARIPIPRAZOLE 10 MG PO TABS: 20.0000 mg | ORAL_TABLET | Freq: Every day | ORAL | Status: DC

## 2023-09-12 MED ORDER — ACETAMINOPHEN 325 MG PO TABS
650.0000 mg | ORAL_TABLET | Freq: Four times a day (QID) | ORAL | Status: DC | PRN
  Administered 2023-09-13 – 2023-09-14 (×2): 650 mg via ORAL
  Filled 2023-09-12 (×2): qty 2

## 2023-09-12 MED ORDER — HALOPERIDOL 5 MG PO TABS
5.0000 mg | ORAL_TABLET | Freq: Three times a day (TID) | ORAL | Status: DC | PRN
  Administered 2023-09-13 – 2023-09-14 (×3): 5 mg via ORAL
  Filled 2023-09-12 (×2): qty 1

## 2023-09-12 NOTE — BH Assessment (Signed)
 Patient is to be admitted to [facility] by Dr. Dicky  Attending Physician will be Dr. Raliegh.   Patient has been assigned to room 301-1, by [facility] Charge Nurse Bretta RAMAN.   Intake Paper Work has been signed and placed on patient chart.  ER staff is aware of the admission: [facility] ER Secretary   Dr. Waymond, ER MD  [facility] Patient's Nurse  [facility] Patient Access.

## 2023-09-12 NOTE — ED Notes (Signed)
IVC/Psych Consult Ordered/Pending

## 2023-09-12 NOTE — BHH Suicide Risk Assessment (Addendum)
 [facility] Admission Suicide Risk Assessment   Nursing information obtained from:  Patient Demographic factors:  Male Current Mental Status:  NA Loss Factors:  NA Historical Factors:  Prior suicide attempts Risk Reduction Factors:  Religious beliefs about death, Living with another person, especially a relative, Positive social support  Total Time spent with patient:: 1 Hour Principal Problem: Cocaine abuse with cocaine-induced mood disorder (HCC) Diagnosis:  Principal Problem:   Cocaine abuse with cocaine-induced mood disorder (HCC) Active Problems:   Cannabis abuse   Substance-induced psychotic disorder with delusions (HCC)   Severe stimulant use disorder (HCC)   Intellectual developmental disorder, mild   Subjective Data:   [name] is a 33 yr old male with a past medical history of cocaine use disorder and substance-induced psychosis who presented to [facility] ED on 09/11/23 under IVC due to psychosis in the setting of medication nonadherence and cocaine abuse. He was notably admitted to [facility] 7/15 and 8/5 for similar complaints.   Patient reports being in jail for past few weeks without medications which he attributes as to why he was hospitalized and IVC'd. He reports being put in jail for impeding traffic. He minimizes the reason for going to jail as not murdering anyone but a little impeding traffic or a little assault. He admits to snorting cocaine semi-regularly still. He presented to ED psychotic believing he needs to go back to his [place] and struggling with cosmic forces in his brain. He states he stops taking his PO medications because he feels they make him tired and he can't be tired when he runs away from mom's house. He reports he uses cocaine to keep himself awake while on the street and hanging out with my friends as he states he cannot be tired or else he'll be beat up or robbed. He feels mom's house is much safer but often leaves it because of boredom. He denies  current SI/HI/AVH. He reports feeling completely fine at this time and ready to discharge. We discussed needing to figure out what best way to address his symptoms is as he has been hospitalized twice in very short intervals for psychiatric problems. He verbalized understanding but reports he would like to be on IM meds that are not very sedating.   Continued Clinical Symptoms:  Alcohol Use Disorder Identification Test Final Score (AUDIT): 0 The Alcohol Use Disorders Identification Test, Guidelines for Use in Primary Care, Second Edition.  World Science writer [facility]). Score between 0-7:  no or low risk or alcohol related problems. Score between 8-15:  moderate risk of alcohol related problems. Score between 16-19:  high risk of alcohol related problems. Score 20 or above:  warrants further diagnostic evaluation for alcohol dependence and treatment.   CLINICAL FACTORS:   Severe Anxiety and/or Agitation   Musculoskeletal: Strength & Muscle Tone: within normal limits Gait & Station: normal Patient leans: N/A  Psychiatric Specialty Exam:  Presentation  General Appearance:  Appropriate for Environment   Eye Contact: Good   Speech: Normal Rate; Clear and Coherent   Speech Volume: Normal   Handedness: Right   Mood and Affect  Mood: Euthymic   Affect: Appropriate; Full Range; Congruent    Thought Process  Thought Processes: Coherent; Goal Directed; Linear   Descriptions of Associations:Intact   Orientation:Full (Time, Place and Person)   Thought Content:WDL   History of Schizophrenia/Schizoaffective disorder:Yes   Duration of Psychotic Symptoms:Greater than six months   Hallucinations:denies  Ideas of Reference:None   Suicidal Thoughts:denies  Homicidal Thoughts:denies   Sensorium  Memory: Immediate Fair; Recent Fair   Judgment: poor  Insight: poor   Chartered certified accountant: Fair   Attention  Span: Good   Recall: [employer] of Knowledge: Fair   Language: Fair    Psychomotor Activity  Psychomotor Activity:wnl   Assets  Assets: Social Support    Sleep  Sleep:fair      COGNITIVE FEATURES THAT CONTRIBUTE TO RISK:  Closed-mindedness    SUICIDE RISK:   Minimal: No identifiable suicidal ideation.  Patients presenting with no risk factors but with morbid ruminations; may be classified as minimal risk based on the severity of the depressive symptoms  PLAN OF CARE: see H&P  I certify that inpatient services furnished can reasonably be expected to improve the patient's condition.   Prentice Espy, MD 09/12/2023, 2:22 PM

## 2023-09-12 NOTE — ED Notes (Signed)
 Hospital meal provided, pt tolerated w/o complaints.  Waste discarded appropriately.

## 2023-09-12 NOTE — Progress Notes (Addendum)
 Pt yelling and threatening to hurt staff. Pt banging his head against the wall. Verbal de-escalation attempted and unsuccessful. Linsey Strader, [facility] aware and present on the unit. Dr Raliegh Blunt notified and aware.

## 2023-09-12 NOTE — Group Note (Signed)
 Date:  09/12/2023 Time:  4:04 PM  Group Topic/Focus:  The focus of this group is to introduce collage as a creative outlet for expressing emotions, reducing anxiety, fostering group cohesion and enabling personal insight.    Participation Level:  Did Not Attend  [name] 09/12/2023, 4:04 PM

## 2023-09-12 NOTE — ED Provider Notes (Signed)
 Emergency Medicine Observation Re-evaluation Note  [name] is a 33 y.o. male, seen on rounds today.  Pt initially presented to the ED for complaints of IVC Currently, the patient is resting.  Physical Exam  BP (!) 99/59 (BP Location: Left Arm)   Pulse 63   Temp 98.7 F (37.1 C)   Resp 18   Ht 5' 11 (1.803 m)   Wt 68 kg   SpO2 97%   BMI 20.92 kg/m  Physical Exam .Gen:  No acute distress Resp:  Breathing easily and comfortably, no accessory muscle usage Neuro:  Moving all four extremities, no gross focal neuro deficits Psych:  Resting currently, calm when awake   ED Course / MDM  EKG:   I have reviewed the labs performed to date as well as medications administered while in observation.  Recent changes in the last 24 hours include no acute events.  Plan  Current plan is planning transfer, waiting on transport.    Waymond Lorelle Cummins, MD 09/12/23 [phone]
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
## 2023-09-12 NOTE — [facility] Group Notes (Signed)
 [facility] Group Notes:  (Nursing/MHT/Case Management/Adjunct)  Date:  09/12/2023  Time:  2:32 PM  Type of Therapy:  [place]  Participation Level:  Active  Participation Quality:  Appropriate  Affect:  Appropriate  Cognitive:  Appropriate  Insight:  Appropriate  Engagement in Group:  Engaged  Modes of Intervention:  Discussion  Summary of Progress/Problems:  Patient attended and participated in [place].   Danette JONELLE Boos 09/12/2023, 2:32 PM

## 2023-09-12 NOTE — Tx Team (Signed)
 Initial Treatment Plan 09/12/2023 10:43 AM [name] [name] [id number]    PATIENT STRESSORS: Medication change or noncompliance   Substance abuse     PATIENT STRENGTHS: Religious Affiliation  Supportive family/friends    PATIENT IDENTIFIED PROBLEMS: Psychosis                      DISCHARGE CRITERIA:  Improved stabilization in mood, thinking, and/or behavior Motivation to continue treatment in a less acute level of care Need for constant or close observation no longer present Verbal commitment to aftercare and medication compliance  PRELIMINARY DISCHARGE PLAN: Outpatient therapy  PATIENT/FAMILY INVOLVEMENT: This treatment plan has been presented to and reviewed with the patient, [name].  The patient and family have been given the opportunity to ask questions and make suggestions.  Carlinda Ohlson M Travone Georg, RN 09/12/2023, 10:43 AM

## 2023-09-12 NOTE — ED Notes (Signed)
Report called to [place], Therapist, sports.

## 2023-09-12 NOTE — BHH Counselor (Signed)
 Adult Comprehensive Assessment  Patient ID: [name], male   DOB: [birth year], 33 y.o.   MRN: [id number]  Information Source: Information source: Patient  Current Stressors:  Patient states their primary concerns and needs for treatment are:: Patient states he was IVC because he was thinking he needed different medicine to keep him up longer and grounded.  Patient states police took him from jail to the hospital. Patient states their goals for this hospitilization and ongoing recovery are:: I want to go home and get on medicine. Educational / Learning stressors: Gifted Employment / Job issues: None noted Family Relationships: None stated. Financial / Lack of resources (include bankruptcy): Patient states no, because someone is going to help him. Housing / Lack of housing: Yes, I need housing. Physical health (include injuries & life threatening diseases): Yes, my head. Social relationships: I do sometimes, my confidence sometimes be low and I be wanting to kill myself. Substance abuse: I just be using it because the medicine is strong and makes me go to sleep. Bereavement / Loss: My cousin my Precious had breast cancer and she died.  Living/Environment/Situation:  Living Arrangements: Parent (Mom) Living conditions (as described by patient or guardian): House Who else lives in the home?: Just me and my mother, my sister comes sometimes with the kids. How long has patient lived in current situation?: For a long time. What is atmosphere in current home: Comfortable, Loving  Family History:  Marital status: Single Are you sexually active?: No (Not really) What is your sexual orientation?: Heterosexual. Has your sexual activity been affected by drugs, alcohol, medication, or emotional stress?: No Does patient have children?:  (I dont know.) How many children?: 1 How is patient's relationship with their children?: She's 12, 13 our relationship is excellent, I  think she really is my daughter.  Childhood History:  By whom was/is the patient raised?: Mother Additional childhood history information: None reported. Description of patient's relationship with caregiver when they were a child: Good Patient's description of current relationship with people who raised him/her: It's still good, I was adopted, I talk to them both. How were you disciplined when you got in trouble as a child/adolescent?: Switch. Does patient have siblings?: Yes Number of Siblings: 5 Description of patient's current relationship with siblings: Good. Did patient suffer any verbal/emotional/physical/sexual abuse as a child?: No Did patient suffer from severe childhood neglect?: No Has patient ever been sexually abused/assaulted/raped as an adolescent or adult?: No Was the patient ever a victim of a crime or a disaster?: No Witnessed domestic violence?: No Has patient been affected by domestic violence as an adult?: No Description of domestic violence: Nothing reported.  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 Currently a student?: No Learning disability?: No What learning problems does patient have?: Some people think I do, I dont.  Employment/Work Situation:   Employment Situation: Unemployed Where is Patient Currently Employed?: Patient is unemployed. How Long has Patient Been Employed?: n/a Are You Satisfied With Your Job?: No Do You Work More Than One Job?: No Work Stressors: n/a Patient's Job has Been Impacted by Current Illness: No Describe how Patient's Job has Been Impacted: I have never worked. What is the Longest Time Patient has Held a Job?: 'Three weeks. Where was the Patient Employed at that Time?: [employer]. Has Patient ever Been in the [employer]?: No  Financial Resources:   Financial resources: No  income (Patient unable to identify  during assessment.) Does patient have a representative payee or guardian?: No  Alcohol/Substance Abuse:   What has been your use of drugs/alcohol within the last 12 months?: Cocaine, I may hit it twice once during the night, I think I started when I was 30. I smoke weed but I dont like to smoke it because it stinks. If attempted suicide, did drugs/alcohol play a role in this?: No Alcohol/Substance Abuse Treatment Hx: Denies past history If yes, describe treatment: Patient denies. Has alcohol/substance abuse ever caused legal problems?: No (Patient denies legal problems due to substances.)  Social Support System:   Describe Community Support System: Everyone is my community support. Type of faith/religion: I believe in God. How does patient's faith help to cope with current illness?: God helps me because I have him in me.  Leisure/Recreation:   Do You Have Hobbies?: Yes Leisure and Hobbies: I'm intellectual, I like beautiful things, I like verbal expressions, dreams, intentions.  Strengths/Needs:   What is the patient's perception of their strengths?: I'm good at everything, I'm just a blueprint. Patient states they can use these personal strengths during their treatment to contribute to their recovery: It's helping me not to hurt myself. Patient states these barriers may affect/interfere with their treatment: The disfective on the proactive on the intersepction on antihistamines.... Patient states these barriers may affect their return to the community: Yeah, my challenges are like I'm not like anyone eles.. Other important information patient would like considered in planning for their treatment: They want me in here.  Discharge Plan:   Currently receiving community mental health services: No Patient states concerns and preferences for aftercare planning are: I need to be in a facility like this. Patient states they will know when they are safe and ready for  discharge when: I dont know, they kerrin' never want me to come back to society because my mind...they feel I'm too crazy for society but I'm too smart. Does patient have access to transportation?: No Does patient have financial barriers related to discharge medications?: Yes Patient description of barriers related to discharge medications: I take my injections. Plan for no access to transportation at discharge: SW to assist with tranport. Plan for living situation after discharge: I can go to my mom's house. Will patient be returning to same living situation after discharge?: No  Summary/Recommendations:    [name] is 33 year old heterosexual male  IVC due to psychosis in the setting of medication nonadherence and cocaine abuse.   Per chart review Chief complaint: Psychosis, cocaine abuse with cocaine-induced mood disorder history of schizophrenia, substance-induced mood disorder, cocaine use disorder, cannabis use disorder, malingering.   Patient recently discharged this month from [facility]. During assessment, client pleasant with disorganized and incoherent speech.  Patient states he may return home with mother but will require assistance with transportation.   Patient will benefit from crisis stabilization, medication evaluation, group therapy and psychoeducation, in addition to case management for discharge planning. At discharge it is recommended that Patient adhere to the established discharge plan and continue in treatment.  [name]. 09/12/2023

## 2023-09-12 NOTE — Progress Notes (Signed)
   09/12/23 0935  Psych Admission Type (Psych Patients Only)  Admission Status Involuntary  Psychosocial Assessment  Patient Complaints Substance abuse  Eye Contact Fair  Facial Expression Animated  Affect Silly  Speech Tangential  Interaction Assertive  Motor Activity (S)  Other (Comment) (WDL)  Appearance/Hygiene In scrubs  Behavior Characteristics Cooperative;Calm  Mood Pleasant  Thought Process  Coherency Tangential  Content Delusions  Delusions Grandeur  Perception WDL  Hallucination None reported or observed  Judgment Limited  Confusion None  Danger to Self  Current suicidal ideation? Denies  Agreement Not to Harm Self Yes  Description of Agreement verbal  Danger to Others  Danger to Others None reported or observed   Upon admission, pt is calm and cooperative but is experiencing delusions of grandeur and expresses the belief that he has a spaceship and can Get us  to [place] which is past [place]. Pt denies recent drug or alcohol use. Pt states he was recently incarcerated and therefor unable to have access to his medications.

## 2023-09-12 NOTE — Progress Notes (Signed)
 While in line for dinner, patient became agitated. This RN walked out to the patient yelling and being verbally abusive to staff. Verbal de-escalation attempted but unsuccessful. Patient began hitting his head against the wall. Patient was able to be willingly escorted back onto the 500 hall and into the dayroom. Staff was present and spoke with patient. Patient given IM agitation protocol, per MAR. No visible injury noted to patient's forehead and patient denied any symptoms. Patient calmed down and was able to eat dinner. Patient currently in his room resting. No distress noted. Safety checks continue. Patient remains safe at this time.

## 2023-09-12 NOTE — Group Note (Signed)
 Date:  09/12/2023 Time:  8:31 PM  Group Topic/Focus:  Wrap-Up Group:   The focus of this group is to help patients review their daily goal of treatment and discuss progress on daily workbooks.    Participation Level:  Did Not Attend   [name] 09/12/2023, 8:31 PM

## 2023-09-13 ENCOUNTER — Encounter (HOSPITAL_COMMUNITY): Payer: Self-pay

## 2023-09-13 DIAGNOSIS — F19959 Other psychoactive substance use, unspecified with psychoactive substance-induced psychotic disorder, unspecified: Secondary | ICD-10-CM

## 2023-09-13 MED ORDER — WHITE PETROLATUM EX OINT
TOPICAL_OINTMENT | CUTANEOUS | Status: AC
  Filled 2023-09-13: qty 5

## 2023-09-13 NOTE — [facility] Group Notes (Signed)
 Adult Psychoeducational Group Note  Date:  09/13/2023 Time:  9:17 PM  Group Topic/Focus:  Wrap-Up Group:   The focus of this group is to help patients review their daily goal of treatment and discuss progress on daily workbooks.  Participation Level:  Did Not Attend  Participation Quality:    Affect:    Cognitive:    Insight:   Engagement in Group:    Modes of Intervention:    Additional Comments:Pt did not attend group.     Drue Pouch 09/13/2023, 9:17 PM

## 2023-09-13 NOTE — Plan of Care (Signed)
  Problem: Coping: Goal: Ability to verbalize frustrations and anger appropriately will improve Outcome: Progressing Goal: Ability to demonstrate self-control will improve Outcome: Progressing   Problem: Health Behavior/Discharge Planning: Goal: Identification of resources available to assist in meeting health care needs will improve Outcome: Progressing   

## 2023-09-13 NOTE — Progress Notes (Signed)
(  Sleep Hours) - 9.75 (Any PRNs that were needed, meds refused, or side effects to meds)- none (Any disturbances and when (visitation, over night) (Concerns raised by the patient)- None during shift, received IM prior to shift  @ 1740 (SI/HI/AVH)- Denies

## 2023-09-13 NOTE — [facility] Group Notes (Signed)
Type of Therapy:  Group Topic/ Focus: Goals Group: The focus of this group is to help patients establish daily goals to achieve during treatment and discuss how the patient can incorporate goal setting into their daily lives to aide in recovery.    Participation Level:  Active   Participation Quality:  Appropriate   Affect:  Appropriate   Cognitive:  Appropriate   Insight:  Appropriate   Engagement in Group:  Engaged   Modes of Intervention:  Discussion   Summary of Progress/Problems:   Patient attended and participated goals group today. No SI/HI. Patient's goal for today is to work on my mental health.

## 2023-09-13 NOTE — Progress Notes (Addendum)
 Patient became irritable when no one had time to listen to him talk and stated everyone just wants to watch tv, but I can't watch tv. Patient requested this writer look up a phone number of a person that would be available to listen to patient talk. Patient began to escalate by pacing and demanding attention. Patient cooperatively took PO agitation protocol per [facility] with no incident.

## 2023-09-13 NOTE — BH IP Treatment Plan (Signed)
 Interdisciplinary Treatment and Diagnostic Plan Update  09/13/2023 Time of Session: 10:25AM [name] MRN: [id number]  Principal Diagnosis: Cocaine abuse with cocaine-induced mood disorder (HCC)  Secondary Diagnoses: Principal Problem:   Cocaine abuse with cocaine-induced mood disorder (HCC) Active Problems:   Cannabis abuse   Substance-induced psychotic disorder with delusions (HCC)   Severe stimulant use disorder (HCC)   Intellectual developmental disorder, mild   Current Medications:  Current Facility-Administered Medications  Medication Dose Route Frequency Provider Last Rate Last Admin   acetaminophen  (TYLENOL ) tablet 650 mg  650 mg Oral Q6H PRN Madaram, Kondal R, MD       alum & mag hydroxide-simeth (MAALOX/MYLANTA) 200-200-20 MG/5ML suspension 30 mL  30 mL Oral Q4H PRN Madaram, Kondal R, MD       ARIPiprazole  (ABILIFY ) tablet 20 mg  20 mg Oral QHS Ji, Andrew, MD   20 mg at 09/13/23 0144   haloperidol  (HALDOL ) tablet 5 mg  5 mg Oral TID PRN Madaram, Kondal R, MD   5 mg at 09/13/23 0813   And   diphenhydrAMINE  (BENADRYL ) capsule 50 mg  50 mg Oral TID PRN Madaram, Kondal R, MD   50 mg at 09/13/23 0813   haloperidol  lactate (HALDOL ) injection 5 mg  5 mg Intramuscular TID PRN Madaram, Kondal R, MD   5 mg at 09/12/23 1740   And   diphenhydrAMINE  (BENADRYL ) injection 50 mg  50 mg Intramuscular TID PRN Madaram, Kondal R, MD   50 mg at 09/12/23 1739   And   LORazepam  (ATIVAN ) injection 2 mg  2 mg Intramuscular TID PRN Madaram, Kondal R, MD   2 mg at 09/12/23 1740   guanFACINE  (INTUNIV ) ER tablet 1 mg  1 mg Oral QHS Ji, Andrew, MD   1 mg at 09/13/23 0145   magnesium  hydroxide (MILK OF MAGNESIA) suspension 30 mL  30 mL Oral Daily PRN Madaram, Kondal R, MD       nicotine  polacrilex (NICORETTE ) gum 2 mg  2 mg Oral PRN Pashayan, Alexander S, DO   2 mg at 09/13/23 1239   PTA Medications: Medications Prior to Admission  Medication Sig Dispense Refill Last Dose/Taking   ARIPiprazole   (ABILIFY ) 15 MG tablet Take 1 tablet (15 mg total) by mouth in the morning. (Patient not taking: Reported on 09/11/2023) 14 tablet 0    [START ON 09/21/2023] ARIPiprazole  ER (ABILIFY  MAINTENA) 300 MG PRSY prefilled syringe Inject 300 mg into the muscle every 28 (twenty-eight) days. (Patient not taking: Reported on 09/11/2023) 1 each 0    divalproex  (DEPAKOTE ) 500 MG DR tablet Take 1 tablet (500 mg total) by mouth at bedtime. (Patient not taking: Reported on 09/11/2023) 30 tablet 0     Patient Stressors: Medication change or noncompliance   Substance abuse    Patient Strengths: Religious Affiliation  Supportive family/friends   Treatment Modalities: Medication Management, Group therapy, Case management,  1 to 1 session with clinician, Psychoeducation, Recreational therapy.   Physician Treatment Plan for Primary Diagnosis: Cocaine abuse with cocaine-induced mood disorder (HCC) Long Term Goal(s):     Short Term Goals:    Medication Management: Evaluate patient's response, side effects, and tolerance of medication regimen.  Therapeutic Interventions: 1 to 1 sessions, Unit Group sessions and Medication administration.  Evaluation of Outcomes: Not Progressing  Physician Treatment Plan for Secondary Diagnosis: Principal Problem:   Cocaine abuse with cocaine-induced mood disorder (HCC) Active Problems:   Cannabis abuse   Substance-induced psychotic disorder with delusions (HCC)   Severe stimulant  use disorder (HCC)   Intellectual developmental disorder, mild  Long Term Goal(s):     Short Term Goals:       Medication Management: Evaluate patient's response, side effects, and tolerance of medication regimen.  Therapeutic Interventions: 1 to 1 sessions, Unit Group sessions and Medication administration.  Evaluation of Outcomes: Not Progressing   RN Treatment Plan for Primary Diagnosis: Cocaine abuse with cocaine-induced mood disorder (HCC) Long Term Goal(s): Knowledge of disease and  therapeutic regimen to maintain health will improve  Short Term Goals: Ability to verbalize frustration and anger appropriately will improve, Ability to demonstrate self-control, Ability to participate in decision making will improve, Ability to verbalize feelings will improve, Ability to disclose and discuss suicidal ideas, Ability to identify and develop effective coping behaviors will improve, and Compliance with prescribed medications will improve  Medication Management: RN will administer medications as ordered by provider, will assess and evaluate patient's response and provide education to patient for prescribed medication. RN will report any adverse and/or side effects to prescribing provider.  Therapeutic Interventions: 1 on 1 counseling sessions, Psychoeducation, Medication administration, Evaluate responses to treatment, Monitor vital signs and CBGs as ordered, Perform/monitor CIWA, COWS, AIMS and Fall Risk screenings as ordered, Perform wound care treatments as ordered.  Evaluation of Outcomes: Not Progressing   LCSW Treatment Plan for Primary Diagnosis: Cocaine abuse with cocaine-induced mood disorder (HCC) Long Term Goal(s): Safe transition to appropriate next level of care at discharge, Engage patient in therapeutic group addressing interpersonal concerns.  Short Term Goals: Engage patient in aftercare planning with referrals and resources, Increase social support, Increase ability to appropriately verbalize feelings, Increase emotional regulation, Facilitate acceptance of mental health diagnosis and concerns, Facilitate patient progression through stages of change regarding substance use diagnoses and concerns, and Identify triggers associated with mental health/substance abuse issues  Therapeutic Interventions: Assess for all discharge needs, 1 to 1 time with Social worker, Explore available resources and support systems, Assess for adequacy in community support network, Educate  family and significant other(s) on suicide prevention, Complete Psychosocial Assessment, Interpersonal group therapy.  Evaluation of Outcomes: Not Progressing   Progress in Treatment: Attending groups: Yes. Participating in groups: Yes. Taking medication as prescribed: Yes. Toleration medication: Yes. Family/Significant other contact made: No, will contact:  [name] (mom) [phone] Patient understands diagnosis: Yes. Discussing patient identified problems/goals with staff: Yes. Medical problems stabilized or resolved: Yes. Denies suicidal/homicidal ideation: Yes. Issues/concerns per patient self-inventory: No.  Patient Goals:  to work on a discharge plan. I'm ready to go whenever the doctors think I'm ready.  Discharge Plan or Barriers: Patient likely to discharge back to home with family once stable.  Reason for Continuation of Hospitalization: Medication stabilization  Estimated Length of Stay: 5-7 days  Last 3 [place] Suicide Severity Risk Score: Flowsheet Row [facility] (Discharged[facility]  C-SSRS RISK CATEGORY High Risk Error: Q3, 4, or 5 should not be populated when Q2 is No High Risk    Last PHQ 2/9 Scores:    08/10/2023    3:03 PM  Depression screen PHQ 2/9  Decreased Interest 1  Down, Depressed, Hopeless 1  PHQ - 2 Score 2  Altered sleeping 0  Tired, decreased energy 1  Change in appetite 0  Feeling bad or failure about yourself  1  Trouble concentrating 1  Moving  slowly or fidgety/restless 0  Suicidal thoughts 0  PHQ-9 Score 5    Scribe for Treatment Team: Fama Muenchow M Jeziel Hoffmann, LCSWA 09/13/2023 1:04 PM

## 2023-09-13 NOTE — Progress Notes (Signed)
 [name] requested a Koran which I did not have at this location at this time, but will try to locate one. He then presented with hyper-religiosity and stated that he is Jesus and wanted to show me the Birth marks that are the holes from being nailed to the cross.  He read several passages of the Bible aloud and shared how they all came together to show that he is Jesus.  I provided listening and will check in with staff to be sure that Koran will not further exacerbate hyper-religiosity before giving to him.

## 2023-09-13 NOTE — Plan of Care (Signed)

## 2023-09-13 NOTE — Progress Notes (Signed)
 [facility] Inpatient Psychiatry Progress Note  Date: 09/13/2023 Patient: [name] MRN: [id number]  Assessment & Plan  [name] is a 33 yr old male with a past medical history of cocaine use disorder and substance-induced psychosis who presented to [facility] ED on 09/11/23 under IVC due to psychosis in the setting of medication nonadherence and cocaine abuse. He was notably admitted to [facility] 7/15 and 8/5 for similar complaints.   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  When asked why he did this, he said he just was not sure what was going on.  UDS positive for cocaine.  Continues to lack insight into how his cocaine use is affecting his mental health and hospitalizations.  He has been nonadherent to Depakote  which he was discharged on from his last hospitalization.  Considering giving him another Abilify  LAI before he leaves the hospital.  Is consistent hospitalizations and psychoses appear to be primarily due to his cocaine abuse and poor impulse control. UDS: Cocaine positive EKG: QTc: 443 - Continue Abilify  at 20 mg nightly - Last received Abilify  maintena 300 mg on 08/21/2023 - Continue guanfacine  to aid with impulse control and historical diagnosis of ADHD - Stopped Depakote  due to nonadherence   Discharge Planning:  His cocaine abuse and poor impulse control appear to be the primary reasons for his regular rehospitalizations, therefore residential rehab for habit or an [facility] may be helpful, however he does appear in the precontemplative stage regarding cessation of cocaine.             -- Social work and case management to assist with discharge planning and identification of hospital follow-up needs prior to discharge             -- Estimated LOS: 5-7 days             -- Discharge Concerns: Need to establish a safety plan;  Medication compliance and effectiveness             -- Discharge Goals: Return home with outpatient referrals for mental health follow-up including medication management/psychotherapy  Group Therapy:             -- Encouraged patient to participate in unit milieu and in scheduled group therapies              -- Short Term Goals: Ability to identify changes in lifestyle to reduce recurrence of condition, verbalize feelings, identify and develop effective coping behaviors, maintain clinical measurements within normal limits, and identify triggers associated with substance abuse/mental health issues will improve. Improvement in ability to demonstrate self-control and comply with prescribed medications.             -- Long Term Goals: Improvement in symptoms so as ready for discharge -- Patient is encouraged to participate in group therapy while admitted to the psychiatric unit. -- We will address other chronic and acute stressors, which contributed to the patient's Cocaine abuse with cocaine-induced mood disorder (HCC) in order to reduce the risk of self-harm at discharge.  Safety and Monitoring:             -- Involuntary admission to inpatient psychiatric unit for safety, stabilization and treatment             -- Daily contact with patient to assess and evaluate symptoms and progress in treatment             --  Patient's case to be discussed in multi-disciplinary team meeting             -- Observation Level : q15 minute checks             -- Vital signs: q12 hours             -- Precautions: suicide, elopement, and assault  Medical Problems -- none  Risk Assessment: Patient continues to be gravely disabled and unable to care for self due to severe and impairing psychosis.   Discharge Planning: Barriers to Discharge: Violently poor impulse control. Estimated Length of Stay: 5 to 7 days Predicted Discharge Location: Home or residential rehab or [facility]   Subjective  [name] is a 33 yr old male with a past medical history of cocaine use disorder and substance-induced psychosis who presented to [facility] ED on 09/11/23 under IVC due to psychosis in the setting of medication nonadherence and cocaine abuse. He was notably admitted to [facility] 7/15 and 8/5 for similar complaints.   Yesterday, patient was agitated while waiting in line for dinner, began making threats to hurt staff and started banging his head against the wall.  Verbal de-escalation was unsuccessful and patient required medication assistance for de-escalation. MAR was reviewed and patient was compliant with medications yesterday. He received PRN medications yesterday: Haldol  5 mg, Benadryl  50 mg, Ativan  2 mg altogether combined dose during his agitation. Case was discussed in the multidisciplinary team.   On interview 09/13/23, patient reports that he is feeling completely better and ready to go back home.  He says that he is mentally in a good place and he is having no thoughts of wanting to harm himself or others and is denying any hallucinations.  Says that he slept well and he is extremely grateful for all the staff here who has been very kind to him and helpful for helping him to get clear minded.  Prior to the interview, patient was appearing to do something similar to tai chi in the day room and not interacting with others around him.  Discussed plan with patient to which they were agreeable. Pt questions were answered.   Objective  Vitals: Blood pressure 107/67, pulse 82, temperature 97.6 F (36.4 C), temperature source Oral, resp. rate 20, height 5' 11 (1.803 m), weight 72.8 kg, SpO2 99%.   Physical Examination:  Vitals and nursing note reviewed  Musculoskeletal:  Strength & Muscle Tone: within normal limits Gait & Station: normal   Mental Status Exam  Apperance: Appropriate for environment and Standing Behavior: Calm Speech: Normal Rate, Articulate, Normal Volume, and Responsive Attitude:  Cooperative and Friendly Mood: very good Affect: Euthymic, Normal Range, and Mood Congruent Perception: Not responding to internal stimuli Thought Content: within normal limits and very discharged focused Thought Form: Goal Directed, Organized, Linear, and Logical Cognition: Alert & Oriented to person, place, and time, Recent and Remote memory grossly intact by recounting personal history, and Immediate memory grossly intact by interview Judgment: POOR Insight: POOR    Lab Results:   No visits with results within 1 Day(s) from this visit.  Latest known visit with results is:  Admission on 09/11/2023, Discharged on 09/12/2023  Component Date Value Ref Range Status   Sodium 09/11/2023 139  135 - 145 mmol/L Final   Potassium 09/11/2023 4.0  3.5 - 5.1 mmol/L Final   Chloride 09/11/2023 103  98 - 111 mmol/L Final   CO2 09/11/2023 25  22 - 32 mmol/L Final  Glucose, Bld 09/11/2023 97  70 - 99 mg/dL Final   BUN 91/69/7974 16  6 - 20 mg/dL Final   Creatinine, Ser 09/11/2023 0.75  0.61 - 1.24 mg/dL Final   Calcium 91/69/7974 9.5  8.9 - 10.3 mg/dL Final   Total Protein 91/69/7974 7.8  6.5 - 8.1 g/dL Final   Albumin 91/69/7974 4.3  3.5 - 5.0 g/dL Final   AST 91/69/7974 22  15 - 41 U/L Final   ALT 09/11/2023 16  0 - 44 U/L Final   Alkaline Phosphatase 09/11/2023 65  38 - 126 U/L Final   Total Bilirubin 09/11/2023 0.5  0.0 - 1.2 mg/dL Final   GFR, Estimated 09/11/2023 >60  >60 mL/min Final   Anion gap 09/11/2023 11  5 - 15 Final   Alcohol, Ethyl (B) 09/11/2023 <15  <15 mg/dL Final   WBC 91/69/7974 6.8  4.0 - 10.5 K/uL Final   RBC 09/11/2023 4.99  4.22 - 5.81 MIL/uL Final   Hemoglobin 09/11/2023 14.6  13.0 - 17.0 g/dL Final   HCT 91/69/7974 43.2  39.0 - 52.0 % Final   MCV 09/11/2023 86.6  80.0 - 100.0 fL Final   MCH 09/11/2023 29.3  26.0 - 34.0 pg Final   MCHC 09/11/2023 33.8  30.0 - 36.0 g/dL Final   RDW 91/69/7974 13.2  11.5 - 15.5 % Final   Platelets 09/11/2023 296  150 - 400 K/uL  Final   nRBC 09/11/2023 0.0  0.0 - 0.2 % Final   Tricyclic, Ur Screen 09/11/2023 NONE DETECTED  NONE DETECTED Final   Amphetamines, Ur Screen 09/11/2023 NONE DETECTED  NONE DETECTED Final   MDMA (Ecstasy)Ur Screen 09/11/2023 NONE DETECTED  NONE DETECTED Final   Cocaine Metabolite,Ur [place] 09/11/2023 POSITIVE (A)  NONE DETECTED Final   Opiate, Ur Screen 09/11/2023 NONE DETECTED  NONE DETECTED Final   Phencyclidine (PCP) Ur S 09/11/2023 NONE DETECTED  NONE DETECTED Final   Cannabinoid 50 Ng, Ur  09/11/2023 NONE DETECTED  NONE DETECTED Final   Barbiturates, Ur Screen 09/11/2023 NONE DETECTED  NONE DETECTED Final   Benzodiazepine, Ur Scrn 09/11/2023 NONE DETECTED  NONE DETECTED Final   Methadone Scn, Ur 09/11/2023 NONE DETECTED  NONE DETECTED Final   Lithium  Lvl 09/11/2023 <0.10 (L)  0.60 - 1.20 mmol/L Final   Valproic Acid  Lvl 09/11/2023 <10 (L)  50 - 100 ug/mL Final       D. Penne Mori Psychiatry  PGY-1 09/13/2023, 10:49 AM

## 2023-09-13 NOTE — Plan of Care (Signed)
  Problem: Education: Goal: Knowledge of [place] [facility] Education information/materials will improve Outcome: Progressing Goal: Mental status will improve Outcome: Progressing   Problem: Activity: Goal: Interest or engagement in activities will improve Outcome: Progressing   

## 2023-09-13 NOTE — Progress Notes (Signed)
   09/13/23 0900  Psych Admission Type (Psych Patients Only)  Admission Status Involuntary  Psychosocial Assessment  Patient Complaints Substance abuse;Other (Comment) (Auditory hallucinations)  Eye Contact Fair  Facial Expression Animated  Affect Labile;Preoccupied  Speech Slow  Interaction Assertive  Motor Activity Fidgety  Appearance/Hygiene Unremarkable  Behavior Characteristics Cooperative;Anxious;Impulsive;Irritable;Intrusive  Mood Anxious;Labile  Thought Process  Coherency Concrete thinking;Flight of ideas  Content Delusions  Delusions Referential  Perception WDL  Hallucination Auditory  Judgment Poor  Confusion None  Danger to Self  Current suicidal ideation? Denies  Agreement Not to Harm Self Yes  Description of Agreement verbal  Danger to Others  Danger to Others None reported or observed

## 2023-09-13 NOTE — Group Note (Signed)
 Recreation Therapy Group Note   Group Topic:Leisure Education  Group Date: 09/13/2023 Start Time: 1025 End Time: 1055 Facilitators: Trigg Delarocha-McCall, LRT,CTRS Location: [address] Dayroom   Group Topic: Leisure Education   Goal Area(s) Addresses:  Patient will successfully identify positive leisure and recreation activities.  Patient will acknowledge benefits of participation in healthy leisure activities post discharge.  Patient will actively work with peers toward a shared goal.   Behavioral Response: Engaged    Intervention: Competitive Group Game   Activity: Guess the [employer]. Patients were to use the spinner to land on a category ([address], Hip Hop, Indie, Dance, R&B and Pop). Whatever category the patient landed on, LRT would read the lyric and patient had to guess the missing word from the lyric. It patient didn't guess the correct answer, everyone else got a chance to steal the point. Whoever guessed the correct answer, got the point. The person with the most cards at the end of the game was the winner.   Education:  Teacher, English as a foreign language, Stress Management, Discharge Planning  Education Outcome: Acknowledges education/In group clarification offered/Needs additional education   Affect/Mood: Appropriate   Participation Level: Engaged   Participation Quality: Independent   Behavior: Appropriate   Speech/Thought Process: Focused   Insight: Good   Judgement: Good   Modes of Intervention: Competitive Play   Patient Response to Interventions:  Engaged   Education Outcome:  In group clarification offered    Clinical Observations/Individualized Feedback: Pt was bright and engaged throughout activity. Pt would fake like was going to quit when he didn't get an answer correct because his peer was winning. Pt was active and engaged well with peers. Pt would also try to help peers during the game.      Plan: Continue to engage patient in RT group sessions  2-3x/week.   Christapher Gillian-McCall, LRT,CTRS 09/13/2023 1:29 PM

## 2023-09-13 NOTE — Progress Notes (Signed)
 Recreation Therapy Notes  Patient admitted to unit 8.31.25. Due to admission within last year, no new recreation therapy assessment conducted at this time. Last assessment conducted on 7.16.25.    Reason for current admission per patient, I was IVC'd to make sure I was mentally stable.  Patient reports no stressors for this admission.  Patient stated self injury is no longer a coping skill.  Patient reports goal of to get out.  Patient denies SI, HI, AVH at this time.    Information found below from assessment conducted 7.16.25.  Coping Skills: Isolation, TV, Sports, Self Injury, Exercise, Music, Deep Breathing, Meditate, Substance Abuse, Talk, Prayer, Avoidance, Read, Dance, Hot Bath/Shower  Leisure Interests: Listen to music, Watch TV, Lay down and Eat  Strengths: Singing, Rapping, Listening, Discerning, Make others happy  Areas of Improvement: Attitude, Drugs    Angelynn Lemus-McCall, LRT,CTRS Vashon Riordan A Averi Cacioppo-McCall 09/13/2023 2:00 PM

## 2023-09-13 NOTE — Group Note (Signed)
 LCSW Group Therapy Note   Group Date: 09/13/2023 Start Time: 1300 End Time: 1400   Participation:  patient was present and actively participated in the discussion.  Type of Therapy:  Group Therapy  Topic:  Shining from Within:  Confidence and Self-Love Journey  Objective:  To support participants in developing confidence and self-love through self-awareness, self-compassion, and practical skills that nurture personal growth.   Group Goals Encourage self-reflection and self-acceptance by identifying personal strengths and achievements. Teach skills to challenge negative self-talk and replace it with supportive, truthful self-talk. Foster resilience and self-worth through [employer], gratitude, and self-care practices.   Summary:  This group explores the connection between confidence and self-love by guiding participants through reflection, mindset shifts, and practical tools like affirmations, strength recognition, and goal-setting. Activities are designed to promote self-compassion, build emotional resilience, and normalize the slow, patient journey of inner growth.   Therapeutic Modalities Used Cognitive Behavioral Therapy (CBT): Challenging and reframing unhelpful self-talk. Motivational Interviewing (MI): Encouraging small, achievable goals. Elements of Dialectical Behavioral Therapist (DBT):  Mindfulness and Self-Compassion: Promoting present-moment awareness and kindness toward self.   Alyssa Mancera O Breane Grunwald, LCSWA 09/13/2023  7:23 PM

## 2023-09-14 MED ORDER — OLANZAPINE 5 MG PO TABS: 5.0000 mg | ORAL_TABLET | Freq: Three times a day (TID) | ORAL | Status: DC | PRN

## 2023-09-14 MED ORDER — LORAZEPAM 1 MG PO TABS: 2.0000 mg | ORAL_TABLET | Freq: Three times a day (TID) | ORAL | Status: DC | PRN

## 2023-09-14 MED ORDER — OLANZAPINE 5 MG PO TABS
5.0000 mg | ORAL_TABLET | Freq: Every day | ORAL | Status: DC
  Administered 2023-09-14 – 2023-09-15 (×2): 5 mg via ORAL
  Filled 2023-09-14: qty 7
  Filled 2023-09-14 (×2): qty 1

## 2023-09-14 MED ORDER — ARIPIPRAZOLE 10 MG PO TABS
10.0000 mg | ORAL_TABLET | Freq: Every day | ORAL | Status: DC
  Administered 2023-09-15: 10 mg via ORAL
  Filled 2023-09-14: qty 1

## 2023-09-14 MED ORDER — DIPHENHYDRAMINE HCL 25 MG PO CAPS: 50.0000 mg | ORAL_CAPSULE | Freq: Three times a day (TID) | ORAL | Status: DC | PRN

## 2023-09-14 MED ORDER — LORAZEPAM 2 MG/ML IJ SOLN: 2.0000 mg | Freq: Three times a day (TID) | INTRAMUSCULAR | Status: DC | PRN

## 2023-09-14 NOTE — Plan of Care (Signed)
  Problem: Education: Goal: Emotional status will improve Outcome: Not Progressing Goal: Mental status will improve Outcome: Not Progressing Goal: Verbalization of understanding the information provided will improve Outcome: Progressing   Problem: Activity: Goal: Interest or engagement in activities will improve Outcome: Progressing   Problem: Coping: Goal: Ability to demonstrate self-control will improve Outcome: Not Progressing

## 2023-09-14 NOTE — Progress Notes (Signed)
 Patient was returned from lunch because he head-butted an electrical box and then fell to the floor. Patient became irritated by the same doctor from before. Patient is A&Ox 4. No injury noted on the head. Patient is able to walk with a steady gait. VSS. Will continue to monitor.

## 2023-09-14 NOTE — Progress Notes (Signed)
 [facility] Inpatient Psychiatry Progress Note  Date: 09/14/2023 Patient: [name] MRN: [id number]  Subjective  [name] is a 33 yr old male with a past medical history of cocaine use disorder and substance-induced psychosis who presented to [facility] ED on 09/11/23 under IVC due to psychosis in the setting of medication nonadherence and cocaine abuse. He was notably admitted to [facility] 7/15 and 8/5 for similar complaints.   Interval update  09/14/23 Patient was agitated because he felt that no one had time for him and he did not want to watch TV. He agreed to taking PO Haldol /Bendryl to calm down, and no incident ensued. MAR was reviewed and patient was compliant with medications yesterday. He received PRN medications yesterday: Haldol  5 mg, Benadryl  50 mg. Case was discussed in the multidisciplinary team.   On interview 9/2, patient became very agitated.  He was initially asking everyone in the hallway whether or not there is a rubber band hidden in his tissues, saying that if the answer was yes then he would be [employer].  He was asking about discharge planning, and I expressed concern that he might not be ready for discharge because there has been enough time for the crack to come out of his system.  He took extreme offense at this saying that he uses cocaine, not crack.  I apologized for misunderstanding and attempted to redirect, but the patient perseverated on the term I used and very quickly became extremely agitated.  He then, in a threatening way, proceeded to bang his head on the plexiglass window in the hall, where I then stepped away from the interview.  Nearby medical nurses and techs were able to redirect the patient successfully, please see their notes for more details. On follow-up from another provider, patient admits that they overreacted to the situation, and he is still willing to see me as one of his providers.  Objective  Vitals: Blood pressure  106/66, pulse 74, temperature 97.8 F (36.6 C), temperature source Oral, resp. rate 16, height 5' 11 (1.803 m), weight 72.8 kg, SpO2 99%.   Physical Examination:  Vitals and nursing note reviewed  Musculoskeletal:  Strength & Muscle Tone: within normal limits Gait & Station: normal   Mental Status Exam  Apperance: Appropriate for environment and Standing Behavior: AGGRESSIVE Speech: Normal Rate, Articulate, LOUD, and Responsive Attitude: DEFENSIVE Mood: offended Affect: Euthymic, ANGRY, LABILE, and Mood Congruent Perception: Not responding to internal stimuli Thought Content: within normal limits and Delusions: That he was [facility] and that the could purify interviewers soul. Thought Form: DISORGANIZED and PERSEVERATION Cognition: Alert & Oriented to person, place, and time Judgment: POOR Insight: POOR    Lab Results:   No visits with results within 1 Day(s) from this visit.  Latest known visit with results is:  Admission on 09/11/2023, Discharged on 09/12/2023  Component Date Value Ref Range Status   Sodium 09/11/2023 139  135 - 145 mmol/L Final   Potassium 09/11/2023 4.0  3.5 - 5.1 mmol/L Final   Chloride 09/11/2023 103  98 - 111 mmol/L Final   CO2 09/11/2023 25  22 - 32 mmol/L Final   Glucose, Bld 09/11/2023 97  70 - 99 mg/dL Final   BUN 91/69/7974 16  6 - 20 mg/dL Final   Creatinine, Ser 09/11/2023 0.75  0.61 - 1.24 mg/dL Final   Calcium 91/69/7974 9.5  8.9 - 10.3 mg/dL Final   Total Protein 91/69/7974 7.8  6.5 - 8.1 g/dL Final  Albumin 09/11/2023 4.3  3.5 - 5.0 g/dL Final   AST 91/69/7974 22  15 - 41 U/L Final   ALT 09/11/2023 16  0 - 44 U/L Final   Alkaline Phosphatase 09/11/2023 65  38 - 126 U/L Final   Total Bilirubin 09/11/2023 0.5  0.0 - 1.2 mg/dL Final   GFR, Estimated 09/11/2023 >60  >60 mL/min Final   Anion gap 09/11/2023 11  5 - 15 Final   Alcohol, Ethyl (B) 09/11/2023 <15  <15 mg/dL Final   WBC 91/69/7974 6.8  4.0 - 10.5 K/uL Final   RBC  09/11/2023 4.99  4.22 - 5.81 MIL/uL Final   Hemoglobin 09/11/2023 14.6  13.0 - 17.0 g/dL Final   HCT 91/69/7974 43.2  39.0 - 52.0 % Final   MCV 09/11/2023 86.6  80.0 - 100.0 fL Final   MCH 09/11/2023 29.3  26.0 - 34.0 pg Final   MCHC 09/11/2023 33.8  30.0 - 36.0 g/dL Final   RDW 91/69/7974 13.2  11.5 - 15.5 % Final   Platelets 09/11/2023 296  150 - 400 K/uL Final   nRBC 09/11/2023 0.0  0.0 - 0.2 % Final   Tricyclic, Ur Screen 09/11/2023 NONE DETECTED  NONE DETECTED Final   Amphetamines, Ur Screen 09/11/2023 NONE DETECTED  NONE DETECTED Final   MDMA (Ecstasy)Ur Screen 09/11/2023 NONE DETECTED  NONE DETECTED Final   Cocaine Metabolite,Ur [place] 09/11/2023 POSITIVE (A)  NONE DETECTED Final   Opiate, Ur Screen 09/11/2023 NONE DETECTED  NONE DETECTED Final   Phencyclidine (PCP) Ur S 09/11/2023 NONE DETECTED  NONE DETECTED Final   Cannabinoid 50 Ng, Ur [place] 09/11/2023 NONE DETECTED  NONE DETECTED Final   Barbiturates, Ur Screen 09/11/2023 NONE DETECTED  NONE DETECTED Final   Benzodiazepine, Ur Scrn 09/11/2023 NONE DETECTED  NONE DETECTED Final   Methadone Scn, Ur 09/11/2023 NONE DETECTED  NONE DETECTED Final   Lithium  Lvl 09/11/2023 <0.10 (L)  0.60 - 1.20 mmol/L Final   Valproic Acid  Lvl 09/11/2023 <10 (L)  50 - 100 ug/mL Final     Assessment & Plan  [name] is a 33 yr old male with a past medical history of cocaine use disorder and substance-induced psychosis who presented to [facility] ED on 09/11/23 under IVC due to psychosis in the setting of medication nonadherence and cocaine abuse. He was notably admitted to [facility] 7/15 and 8/5 for similar complaints.   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  When asked why he did this, he said he just was not sure what was going on.  UDS positive for cocaine.  Continues to lack insight into how his cocaine use is affecting his  mental health and hospitalizations.  He has been nonadherent to Depakote  which he was discharged on from his last hospitalization.  Considering giving him another Abilify  LAI before he leaves the hospital.  Is consistent hospitalizations and psychoses appear to be primarily due to his cocaine abuse and poor impulse control. UDS: Cocaine positive EKG: QTc: 443 - Continue Abilify  at 20 mg nightly - Last received Abilify  maintena 300 mg on 08/21/2023 - Continue guanfacine  to aid with impulse control and historical diagnosis of ADHD - Stopped Depakote  due to nonadherence - Start Zyprexa  5 mg at night  Psych PRNs - Trazodone  50 mg at bedtime as needed for insomnia - Atarax  25 mg TID as needed for anxiety - Agitation  Protocol: Zyprexa  (because pt is on Abilify ) + ativan  + benadryl   Discharge Planning:  His cocaine abuse and poor impulse control appear to be the primary reasons for his regular rehospitalizations, therefore residential rehab for habit or an [facility] may be helpful, however he does appear in the precontemplative stage regarding cessation of cocaine.             -- Social work and case management to assist with discharge planning and identification of hospital follow-up needs prior to discharge             -- Estimated LOS: 5-7 days             -- Discharge Concerns: Need to establish a safety plan; Medication compliance and effectiveness             -- Discharge Goals: Return home with outpatient referrals for mental health follow-up including medication management/psychotherapy  Group Therapy:             -- Encouraged patient to participate in unit milieu and in scheduled group therapies              -- Short Term Goals: Ability to identify changes in lifestyle to reduce recurrence of condition, verbalize feelings, identify and develop effective coping behaviors, maintain clinical measurements within normal limits, and identify triggers associated with substance abuse/mental  health issues will improve. Improvement in ability to demonstrate self-control and comply with prescribed medications.             -- Long Term Goals: Improvement in symptoms so as ready for discharge -- Patient is encouraged to participate in group therapy while admitted to the psychiatric unit. -- We will address other chronic and acute stressors, which contributed to the patient's Cocaine abuse with cocaine-induced mood disorder (HCC) in order to reduce the risk of self-harm at discharge.  Safety and Monitoring:             -- Involuntary admission to inpatient psychiatric unit for safety, stabilization and treatment             -- Daily contact with patient to assess and evaluate symptoms and progress in treatment             -- Patient's case to be discussed in multi-disciplinary team meeting             -- Observation Level : q15 minute checks             -- Vital signs: q12 hours             -- Precautions: suicide, elopement, and assault  Medical Problems -- none  Risk Assessment: Patient continues to be gravely disabled and unable to care for self due to severe and impairing psychosis.   Discharge Planning: Barriers to Discharge: Violently poor impulse control. Estimated Length of Stay: 5 to 7 days Predicted Discharge Location: Home or residential rehab or [facility]    D. Penne Mori Psychiatry  PGY-1 09/14/2023, 9:37 AM

## 2023-09-14 NOTE — Progress Notes (Signed)
(  Sleep Hours) - 5.25  (Any PRNs that were needed, meds refused, or side effects to meds)-   (Any disturbances and when (visitation, over night)-  (Concerns raised by the patient)-   (SI/HI/AVH)- AH

## 2023-09-14 NOTE — Group Note (Signed)
 Recreation Therapy Group Note   Group Topic:Coping Skills  Group Date: 09/14/2023 Start Time: 1035 End Time: 1100 Facilitators: Twila Rappa-McCall, LRT,CTRS Location: [address] Dayroom   Group Topic: Coping Skills   Goal Area(s) Addresses: Patient will define what a coping skill is. Patient will create a list of healthy coping skills beginning with each letter of the alphabet. Patient will successfully identify positive coping skills they can use post d/c.  Patient will acknowledge benefit(s) of using learned coping skills post d/c.   Behavioral Response:    Intervention: Worksheet   Activity: Coping A to Z. Patient asked to identify what a coping skill is and when they use them. Patients with Clinical research associate discussed healthy versus unhealthy coping skills. Next patients were given a blank worksheet titled Coping Skills A-Z. Patients were instructed to come up with at least one positive coping skill per letter of the alphabet.Patients were given 15 minutes to brainstorm before ideas were presented to the large group. Patients and LRT debriefed on the importance of coping skill selection based on situation and back-up plans when a skill tried is not effective. At the end of group, patients were given an handout of alphabetized strategies to keep for future reference.   Education: Pharmacologist, Scientist, physiological, Discharge Planning.    Education Outcome: Acknowledges education/Verbalizes understanding/In group clarification offered/Additional education needed    Clinical Observations/Individualized Feedback: Due to STAR on unit, group was unable to occur.   Plan: Continue to engage patient in RT group sessions 2-3x/week.   Aziyah Provencal-McCall, LRT,CTRS 09/14/2023 1:22 PM

## 2023-09-14 NOTE — Progress Notes (Signed)
 Patient was playing with tissue paper in the hallway, in good spirits and requesting to speak with doctor about when he can go home. When doctors walked by, patient approached them and asked them if he could go home today. A brief while later patient began to shout I'm not a crackhead! He called me a crackhead! Why do you have to talk to me like that I'm a person. Patient ran up to the day room window and banged his head on it. Patient was surrounded by staff and was therapeutically held to prevent from banging head again. Patient was verbally deescalated and offered PO medications per agitation protocol on MAR and was cooperative and calm. Skin on patient's head was intact, and patient was A&Ox4 with clear speech, steady gait and no change in mood or behaviors from before incident.

## 2023-09-14 NOTE — Progress Notes (Signed)
   09/14/23 0900  Psych Admission Type (Psych Patients Only)  Admission Status Involuntary  Psychosocial Assessment  Patient Complaints None  Eye Contact Fair  Facial Expression Animated  Affect Silly  Speech Slow  Interaction Assertive;Attention-seeking  Motor Activity Fidgety  Appearance/Hygiene Unremarkable  Behavior Characteristics Intrusive;Impulsive  Mood Silly;Pleasant  Thought Process  Coherency Concrete thinking  Content Delusions  Delusions Referential;Grandeur  Perception WDL  Hallucination None reported or observed  Judgment Poor  Confusion None  Danger to Self  Current suicidal ideation? Denies  Agreement Not to Harm Self Yes  Description of Agreement verbal  Danger to Others  Danger to Others None reported or observed  Danger to Others Abnormal  Harmful Behavior to others No threats or harm toward other people

## 2023-09-14 NOTE — Progress Notes (Signed)
 Patient received IM agitation protocol per MAR. Patient was quickly escalating with pressured and hyperverbal speech. Patient then began to move furniture in the day room and shouting I need to get out of here today. Patient was cooperative during administration.

## 2023-09-15 NOTE — Group Note (Signed)
 Date:  09/15/2023 Time:  1:52 AM  Group Topic/Focus:  Wrap-Up Group:   The focus of this group is to help patients review their daily goal of treatment and discuss progress on daily workbooks.    Participation Level:  Did Not Attend  Participation Quality:  N/A  Affect:  N/A  Cognitive:  N/A  Insight: None  Engagement in Group:  N/A  Modes of Intervention:  N/A  Additional Comments:  Patient was encouraged but did not attend  [name] 09/15/2023, 1:52 AM

## 2023-09-15 NOTE — Plan of Care (Signed)
   Problem: Education: Goal: Emotional status will improve Outcome: Progressing Goal: Mental status will improve Outcome: Progressing Goal: Verbalization of understanding the information provided will improve Outcome: Progressing

## 2023-09-15 NOTE — BHH Counselor (Signed)
 CSW called patient's mother [name] [phone], for completion of SPE, however, she did not answer and mailbox was full. CSW will attempt to contact at another time.  Annissa Andreoni, LCSWA 09/15/23 2:26PM

## 2023-09-15 NOTE — Progress Notes (Signed)
   09/15/23 0845  Psych Admission Type (Psych Patients Only)  Admission Status Involuntary  Psychosocial Assessment  Patient Complaints None  Eye Contact Fair  Facial Expression Animated  Affect Silly  Speech Logical/coherent  Interaction Attention-seeking  Motor Activity Fidgety;Restless  Appearance/Hygiene Unremarkable  Behavior Characteristics Impulsive;Intrusive  Mood Silly;Pleasant  Thought Process  Coherency Concrete thinking  Content Delusions  Delusions Grandeur;Referential  Perception WDL  Hallucination None reported or observed  Judgment Poor  Confusion None  Danger to Self  Current suicidal ideation? Denies  Agreement Not to Harm Self Yes  Description of Agreement Verbal

## 2023-09-15 NOTE — Plan of Care (Signed)

## 2023-09-15 NOTE — Plan of Care (Signed)
   Problem: Education: Goal: Emotional status will improve Outcome: Progressing Goal: Mental status will improve Outcome: Progressing   Problem: Activity: Goal: Interest or engagement in activities will improve Outcome: Progressing Goal: Sleeping patterns will improve Outcome: Progressing   Problem: Coping: Goal: Ability to demonstrate self-control will improve Outcome: Progressing

## 2023-09-15 NOTE — Progress Notes (Addendum)
 [facility] Inpatient Psychiatry Progress Note  Date: 09/15/2023 Patient: [name] MRN: [id number]  Subjective  [name] is a 33 yr old male with a past medical history of cocaine use disorder and substance-induced psychosis who presented to [facility] ED on 09/11/23 under IVC due to psychosis in the setting of medication nonadherence and cocaine abuse. He was notably admitted to [facility] 7/15 and 8/5 for similar complaints.   Interval Update  09/15/23  Yesterday, patient required two PRN sets for agitation (1 p.o., 1 IM).  MAR was reviewed and patient was compliant with medications yesterday. Case was discussed in the multidisciplinary team.   On interview patient is very apologetic for his impulsive behavior from yesterday.  He feels bad that he lost his temper, and he noted that after he lost his temper that someone else on the hall became agitated and he feels guilty for this.  He recognizes his impulsivity as this is a regular thing for him.  Collateral: Spoke with mother over the phone, she is comfortable to have him be discharged home to her.  Discussed plan with patient to which they were agreeable. Pt questions were answered.   Objective  Vitals: Blood pressure 106/75, pulse 86, temperature 97.9 F (36.6 C), temperature source Oral, resp. rate 18, height 5' 11 (1.803 m), weight 72.8 kg, SpO2 99%.   Physical Examination:  Vitals and nursing note reviewed  Musculoskeletal:  Strength & Muscle Tone: within normal limits Gait & Station: normal   Mental Status Exam  Apperance: Appropriate for environment, Casual, and Standing Behavior: Calm Speech: Normal Rate, Articulate, Normal Volume, and Responsive Attitude: Cooperative and Friendly Mood: I'm sorry Affect: Euthymic, Normal Range, and Mood Congruent Perception: Not responding to internal stimuli Thought Content: within normal limits Thought Form: Goal Directed, Organized, Linear, and  Logical Cognition: Alert & Oriented to person, place, and time, Recent and Remote memory grossly intact by recounting personal history, and Immediate memory grossly intact by interview Judgment: Fair Insight: POOR   Key Points: Denies SI, HI, A&VH    Lab Results:   No visits with results within 1 Day(s) from this visit.  Latest known visit with results is:  Admission on 09/11/2023, Discharged on 09/12/2023  Component Date Value Ref Range Status   Sodium 09/11/2023 139  135 - 145 mmol/L Final   Potassium 09/11/2023 4.0  3.5 - 5.1 mmol/L Final   Chloride 09/11/2023 103  98 - 111 mmol/L Final   CO2 09/11/2023 25  22 - 32 mmol/L Final   Glucose, Bld 09/11/2023 97  70 - 99 mg/dL Final   BUN 91/69/7974 16  6 - 20 mg/dL Final   Creatinine, Ser 09/11/2023 0.75  0.61 - 1.24 mg/dL Final   Calcium 91/69/7974 9.5  8.9 - 10.3 mg/dL Final   Total Protein 91/69/7974 7.8  6.5 - 8.1 g/dL Final   Albumin 91/69/7974 4.3  3.5 - 5.0 g/dL Final   AST 91/69/7974 22  15 - 41 U/L Final   ALT 09/11/2023 16  0 - 44 U/L Final   Alkaline Phosphatase 09/11/2023 65  38 - 126 U/L Final   Total Bilirubin 09/11/2023 0.5  0.0 - 1.2 mg/dL Final   GFR, Estimated 09/11/2023 >60  >60 mL/min Final   Anion gap 09/11/2023 11  5 - 15 Final   Alcohol, Ethyl (B) 09/11/2023 <15  <15 mg/dL Final   WBC 91/69/7974 6.8  4.0 - 10.5 K/uL Final   RBC 09/11/2023 4.99  4.22 -  5.81 MIL/uL Final   Hemoglobin 09/11/2023 14.6  13.0 - 17.0 g/dL Final   HCT 91/69/7974 43.2  39.0 - 52.0 % Final   MCV 09/11/2023 86.6  80.0 - 100.0 fL Final   MCH 09/11/2023 29.3  26.0 - 34.0 pg Final   MCHC 09/11/2023 33.8  30.0 - 36.0 g/dL Final   RDW 91/69/7974 13.2  11.5 - 15.5 % Final   Platelets 09/11/2023 296  150 - 400 K/uL Final   nRBC 09/11/2023 0.0  0.0 - 0.2 % Final   Tricyclic, Ur Screen 09/11/2023 NONE DETECTED  NONE DETECTED Final   Amphetamines, Ur Screen 09/11/2023 NONE DETECTED  NONE DETECTED Final   MDMA (Ecstasy)Ur Screen 09/11/2023  NONE DETECTED  NONE DETECTED Final   Cocaine Metabolite,Ur [place] 09/11/2023 POSITIVE (A)  NONE DETECTED Final   Opiate, Ur Screen 09/11/2023 NONE DETECTED  NONE DETECTED Final   Phencyclidine (PCP) Ur S 09/11/2023 NONE DETECTED  NONE DETECTED Final   Cannabinoid 50 Ng, Ur [place] 09/11/2023 NONE DETECTED  NONE DETECTED Final   Barbiturates, Ur Screen 09/11/2023 NONE DETECTED  NONE DETECTED Final   Benzodiazepine, Ur Scrn 09/11/2023 NONE DETECTED  NONE DETECTED Final   Methadone Scn, Ur 09/11/2023 NONE DETECTED  NONE DETECTED Final   Lithium  Lvl 09/11/2023 <0.10 (L)  0.60 - 1.20 mmol/L Final   Valproic Acid  Lvl 09/11/2023 <10 (L)  50 - 100 ug/mL Final     Assessment & Plan  [name] is a 33 yr old male with a past medical history of cocaine use disorder and substance-induced psychosis who presented to [facility] ED on 09/11/23 under IVC due to psychosis in the setting of medication nonadherence and cocaine abuse. He was notably admitted to [facility] 7/15 and 8/5 for similar complaints.   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  When asked why he did this, he said he just was not sure what was going on.  UDS positive for cocaine.  Continues to lack insight into how his cocaine use is affecting his mental health and hospitalizations.  He has been nonadherent to Depakote  which he was discharged on from his last hospitalization.  Considering giving him another Abilify  LAI before he leaves the hospital.  Is consistent hospitalizations and psychoses appear to be primarily due to his cocaine abuse and poor impulse control. UDS: Cocaine positive EKG: QTc: 443 - Stop Abilify  at 10 mg nightly (goal is to have him just be on the LAI outpt) - Last received Abilify  maintena 300 mg on 08/21/2023 - Continue guanfacine  to aid with impulse control and historical diagnosis of ADHD - Stopped  Depakote  due to nonadherence - Continue Zyprexa  5 mg at night  Psych General PRNs - Trazodone  50 mg at bedtime as needed for insomnia - Atarax  25 mg TID as needed for anxiety - Agitation Protocol: Zyprexa  (because pt is on Abilify ) + ativan  + benadryl   Discharge Planning:  His cocaine abuse and poor impulse control appear to be the primary reasons for his regular rehospitalizations, therefore residential rehab for habit or an [facility] may be helpful, however he does appear in the precontemplative stage regarding cessation of cocaine.             -- Social work and case management to assist with discharge planning and identification of hospital follow-up needs prior to discharge             --  Estimated LOS: 5-7 days             -- Discharge Concerns: Need to establish a safety plan; Medication compliance and effectiveness             -- Discharge Goals: Return home with outpatient referrals for mental health follow-up including medication management/psychotherapy  Group Therapy:             -- Encouraged patient to participate in unit milieu and in scheduled group therapies              -- Short Term Goals: Ability to identify changes in lifestyle to reduce recurrence of condition, verbalize feelings, identify and develop effective coping behaviors, maintain clinical measurements within normal limits, and identify triggers associated with substance abuse/mental health issues will improve. Improvement in ability to demonstrate self-control and comply with prescribed medications.             -- Long Term Goals: Improvement in symptoms so as ready for discharge -- Patient is encouraged to participate in group therapy while admitted to the psychiatric unit. -- We will address other chronic and acute stressors, which contributed to the patient's Cocaine abuse with cocaine-induced mood disorder (HCC) in order to reduce the risk of self-harm at discharge.  Safety and Monitoring:             --  Involuntary admission to inpatient psychiatric unit for safety, stabilization and treatment             -- Daily contact with patient to assess and evaluate symptoms and progress in treatment             -- Patient's case to be discussed in multi-disciplinary team meeting             -- Observation Level : q15 minute checks             -- Vital signs: q12 hours             -- Precautions: suicide, elopement, and assault  Medical Problems -- none  Risk Assessment: Patient continues to be gravely disabled and unable to care for self due to severe and impairing psychosis.   Discharge Planning: Considering DC 9/4 in the morning if behaviors are good. Needs appointment for next LAI due 9/9. Barriers to Discharge: Violently poor impulse control. Estimated Length of Stay: 5 to 7 days Predicted Discharge Location: Home or residential rehab or [facility]    D. Penne Mori Psychiatry  PGY-1 09/15/2023, 9:37 AM

## 2023-09-15 NOTE — Group Note (Signed)
 Date:  09/15/2023 Time:  8:35 PM  Group Topic/Focus:  Wrap-Up Group:   The focus of this group is to help patients review their daily goal of treatment and discuss progress on daily workbooks.    Participation Level:  Active  Participation Quality:  Appropriate  Affect:  Appropriate  Cognitive:  Appropriate  Insight: Appropriate  Engagement in Group:  Engaged  Modes of Intervention:  Education and Exploration  Additional Comments:  Patient attended and participated in group tonight. He reports that the best thing that happened for him today was enjoying the groups with everyone  [name] 09/15/2023, 8:35 PM

## 2023-09-15 NOTE — Progress Notes (Signed)
(  Sleep Hours) -5.75  (Any PRNs that were needed, meds refused, or side effects to meds)- N/A  (Any disturbances and when (visitation, over night)-N/A  (Concerns raised by the patient)- doctor said if I have a good night I can go home, I'm going to have a good night , you know me Mr [name]  (SI/HI/AVH)-denies

## 2023-09-15 NOTE — Progress Notes (Signed)
(  Sleep Hours) - 8.5 hrs (Any PRNs that were needed, meds refused, or side effects to meds)- none (Any disturbances and when (visitation, over night) (Concerns raised by the patient)- none (SI/HI/AVH)- denies

## 2023-09-15 NOTE — Group Note (Signed)
 Recreation Therapy Group Note   Group Topic:Problem Solving  Group Date: 09/15/2023 Start Time: 1015 End Time: 1055 Facilitators: Anhelica Fowers-McCall, LRT,CTRS Location: 500 Hall Dayroom   Group Topic: Communication, Team Building, Problem Solving  Goal Area(s) Addresses:  Patient will effectively work with peer towards shared goal.  Patient will identify skills used to make activity successful.  Patient will identify how skills used during activity can be used to reach post d/c goals.   Behavioral Response: Engaged  Intervention: STEM Activity  Activity: Stage manager. In teams of 3-5, patients were given 12 plastic drinking straws and an equal length of masking tape. Using the materials provided, patients were asked to build a landing pad to catch a golf ball dropped from approximately 5 feet in the air. All materials were required to be used by the team in their design. LRT facilitated post-activity discussion.  Education: Pharmacist, community, Scientist, physiological, Discharge Planning   Education Outcome: Acknowledges education/In group clarification offered/Needs additional education.    Affect/Mood: Appropriate   Participation Level: Engaged   Participation Quality: Independent   Behavior: Appropriate   Speech/Thought Process: Focused   Insight: Good   Judgement: Good   Modes of Intervention: STEM Activity   Patient Response to Interventions:  Engaged   Education Outcome:  In group clarification offered    Clinical Observations/Individualized Feedback: Pt was bright and interactive during group. Pt was very social and into helping his peers create their landing pad. Pt was appropriate and engaged through group.      Plan: Continue to engage patient in RT group sessions 2-3x/week.   Ahuva Poynor-McCall, LRT,CTRS 09/15/2023 1:29 PM

## 2023-09-16 DIAGNOSIS — F19250 Other psychoactive substance dependence with psychoactive substance-induced psychotic disorder with delusions: Secondary | ICD-10-CM

## 2023-09-16 DIAGNOSIS — F1920 Other psychoactive substance dependence, uncomplicated: Secondary | ICD-10-CM

## 2023-09-16 MED ORDER — ARIPIPRAZOLE 10 MG PO TABS: 10.0000 mg | ORAL_TABLET | Freq: Every day | ORAL | 0 refills | Status: DC

## 2023-09-16 MED ORDER — ARIPIPRAZOLE 10 MG PO TABS
10.0000 mg | ORAL_TABLET | Freq: Every day | ORAL | Status: DC
  Administered 2023-09-16: 10 mg via ORAL
  Filled 2023-09-16: qty 1
  Filled 2023-09-16: qty 14

## 2023-09-16 MED ORDER — WHITE PETROLATUM EX OINT
TOPICAL_OINTMENT | CUTANEOUS | Status: AC
  Filled 2023-09-16: qty 5

## 2023-09-16 MED ORDER — NICOTINE POLACRILEX 2 MG MT GUM: 2.0000 mg | CHEWING_GUM | OROMUCOSAL | 0 refills | Status: AC | PRN

## 2023-09-16 NOTE — Progress Notes (Signed)
 Recreation Therapy Notes  INPATIENT RECREATION TR PLAN  Patient Details Name: [name] MRN: [id number] DOB: 13-May-1990 Today's Date: 09/16/2023  Rec Therapy Plan Is patient appropriate for Therapeutic Recreation?: Yes Treatment times per week: about 3 days Estimated Length of Stay: 5-7 days TR Treatment/Interventions: Group participation (Comment)  Discharge Criteria Pt will be discharged from therapy if:: Discharged Treatment plan/goals/alternatives discussed and agreed upon by:: Patient/family  Discharge Summary Short term goals set: See patient care plan Short term goals met: Not met Progress toward goals comments: Groups attended Which groups?: Leisure education, Other (Comment) (Problem Solving) Reason goals not met: N/A Therapeutic equipment acquired: None Reason patient discharged from therapy: Discharge from hospital Pt/family agrees with progress & goals achieved: Yes Date patient discharged from therapy: 09/16/23   Ione Sandusky-McCall, LRT,CTRS Minette Manders A [name]-McCall 09/16/2023, 1:25 PM

## 2023-09-16 NOTE — Progress Notes (Signed)
  [facility] Adult Case Management Discharge Plan :  Will you be returning to the same living situation after discharge:  Yes,  pt returning home after discharge.  At discharge, do you have transportation home?: Yes,  patient will be transported home via taxi voucher.  Do you have the ability to pay for your medications: Yes,  patient is insured - Medicare Part A & B  Release of information consent forms completed and in the chart;  Patient's signature needed at discharge.  Patient to Follow up at:  Follow-up Information     [facility], [facility]. Go on 09/22/2023.   Why: You have a hospital follow up appointment on 09/22/23 at 10:00 am.  The appointment will be held in person.  Following this appointment you will be scheduled for a clinical assessment, to obtain therapy and medication management services   * Please call to cancel if you are unable to attend. * Contact information: [address] [place] [address] [phone]         [facility]. Schedule an appointment as soon as possible for a visit.   Why: You may also call this provider to schedule an appointment for therapy and medication management services.  They accept Medicare. Contact information: [address]  [address] [place] [address] [phone]                 Next level of care provider has access to [facility] Link:no  Safety Planning and Suicide Prevention discussed: Yes,  completed with patient, unable to reach his mother at number on file.      Has patient been referred to the Quitline?: Patient refused referral for treatment  Patient has been referred for addiction treatment: Patient refused referral for treatment.  Richardo Popoff M Usha Slager, LCSWA 09/16/2023, 9:20 AM

## 2023-09-16 NOTE — Plan of Care (Signed)
 Patient attended and engaged in recreation therapy group sessions without prompting and encouragement from LRT.   [name], LRT,CTRS
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
## 2023-09-16 NOTE — Care Management Important Message (Signed)
 Medicare IM printed and given to social work to give to the patient. ?

## 2023-09-16 NOTE — Progress Notes (Signed)
 Pt discharged to lobby. Pt was stable and appreciative at that time. All papers and prescriptions were given and valuables returned. Verbal understanding expressed. Denies SI/HI and A/VH. Pt given opportunity to express concerns and ask questions.

## 2023-09-16 NOTE — [facility]HH Suicide Risk Assessment (Signed)
 [facility] Discharge Suicide Risk Assessment   Principal Problem: Substance-induced psychotic disorder with delusions [facility]) Discharge Diagnoses: Principal Problem:   Substance-induced psychotic disorder with delusions (HCC) Active Problems:   Cannabis abuse   Cocaine abuse with cocaine-induced mood disorder (HCC)   Severe stimulant use disorder (HCC)   Intellectual developmental disorder, mild  For additional information regarding subjective and objective data, including HPI and plan, please refer to discharge summary.   Suicide risk assessment The patient presented with acute risk factors of active psychotic symptoms in the context of stimulant use (cocaine). He carries additional chronic risk factors including impulsivity, history of self-harming behaviors when upset (head banging), multiple hospitalizations, male gender, and previous history of voiced SI. These are mitigated by no current or recent SI (not on this admission), no depressive symptoms, no active intent or plan. He is future oriented, discussing plans to go home, see his mom and present in-person to court tomorrow. He is no longer exhibiting signs of psychosis and is considered at baseline. Current and immediate risk of suicide is considered low. At this time the most appropriate and least restrictive environment is outpatient follow up. He has been continued on medications and has follow up scheduled as noted below. Main mitigating factor will be maintaining sobriety and continuing on medications, which is best addressed on an outpatient basis.     Follow-up Information     [facility], [facility]. Go on 09/22/2023.   Why: You have a hospital follow up appointment on 09/22/23 at 10:00 am.  The appointment will be held in person.  Following this appointment you will be scheduled for a clinical assessment, to obtain therapy and medication management services   * Please call to cancel if you are unable to attend. * Contact  information: [address] [place] [address] [phone]         [facility]. Schedule an appointment as soon as possible for a visit.   Why: You may also call this provider to schedule an appointment for therapy and medication management services.  They accept Medicare. Contact information: [address]  [address] [place] [address] [phone]                 Leita LOISE Arts, MD 09/16/2023, 9:16 AM

## 2023-09-16 NOTE — BHH Suicide Risk Assessment (Signed)
 [facility] INPATIENT:  Family/Significant Other Suicide Prevention Education  Suicide Prevention Education:  Contact Attempts: [name] (mom) [phone], (name of family member/significant other) has been identified by the patient as the family member/significant other with whom the patient will be residing, and identified as the person(s) who will aid the patient in the event of a mental health crisis.  With written consent from the patient, two attempts were made to provide suicide prevention education, prior to and/or following the patient's discharge.  We were unsuccessful in providing suicide prevention education.  A suicide education pamphlet was given to the patient to share with family/significant other.  Date and time of first attempt:09-15-23/2:44pm Date and time of second attempt:09-16-23/9:20am  [name] 09/16/2023, 9:32 AM

## 2023-09-18 ENCOUNTER — Other Ambulatory Visit: Payer: Self-pay

## 2023-09-18 ENCOUNTER — Emergency Department
Admission: EM | Admit: 2023-09-18 | Discharge: 2023-09-19 | Disposition: A | Discharge status: Other Acute Inpatient Hospital | Attending: Emergency Medicine | Admitting: Emergency Medicine

## 2023-09-18 DIAGNOSIS — F19159 Other psychoactive substance abuse with psychoactive substance-induced psychotic disorder, unspecified: Secondary | ICD-10-CM | POA: Insufficient documentation

## 2023-09-18 DIAGNOSIS — R45851 Suicidal ideations: Secondary | ICD-10-CM | POA: Diagnosis present

## 2023-09-18 DIAGNOSIS — F209 Schizophrenia, unspecified: Secondary | ICD-10-CM | POA: Insufficient documentation

## 2023-09-18 DIAGNOSIS — Z79899 Other long term (current) drug therapy: Secondary | ICD-10-CM | POA: Insufficient documentation

## 2023-09-18 DIAGNOSIS — F15959 Other stimulant use, unspecified with stimulant-induced psychotic disorder, unspecified: Secondary | ICD-10-CM

## 2023-09-18 DIAGNOSIS — F14159 Cocaine abuse with cocaine-induced psychotic disorder, unspecified: Secondary | ICD-10-CM | POA: Diagnosis not present

## 2023-09-18 DIAGNOSIS — T43506A Underdosing of unspecified antipsychotics and neuroleptics, initial encounter: Secondary | ICD-10-CM | POA: Diagnosis not present

## 2023-09-18 DIAGNOSIS — F14959 Cocaine use, unspecified with cocaine-induced psychotic disorder, unspecified: Secondary | ICD-10-CM | POA: Diagnosis not present

## 2023-09-18 DIAGNOSIS — F29 Unspecified psychosis not due to a substance or known physiological condition: Secondary | ICD-10-CM | POA: Diagnosis not present

## 2023-09-18 DIAGNOSIS — Z9151 Personal history of suicidal behavior: Secondary | ICD-10-CM | POA: Diagnosis not present

## 2023-09-18 DIAGNOSIS — Z91128 Patient's intentional underdosing of medication regimen for other reason: Secondary | ICD-10-CM | POA: Insufficient documentation

## 2023-09-18 DIAGNOSIS — F22 Delusional disorders: Secondary | ICD-10-CM | POA: Diagnosis not present

## 2023-09-18 LAB — CBC
HCT: 42.3 % (ref 39.0–52.0)
Hemoglobin: 14.4 g/dL (ref 13.0–17.0)
MCH: 29.4 pg (ref 26.0–34.0)
MCHC: 34 g/dL (ref 30.0–36.0)
MCV: 86.5 fL (ref 80.0–100.0)
Platelets: 235 K/uL (ref 150–400)
RBC: 4.89 MIL/uL (ref 4.22–5.81)
RDW: 13.2 % (ref 11.5–15.5)
WBC: 9.1 K/uL (ref 4.0–10.5)
nRBC: 0 % (ref 0.0–0.2)

## 2023-09-18 LAB — COMPREHENSIVE METABOLIC PANEL WITH GFR
ALT: 32 U/L (ref 0–44)
AST: 27 U/L (ref 15–41)
Albumin: 4.3 g/dL (ref 3.5–5.0)
Alkaline Phosphatase: 70 U/L (ref 38–126)
Anion gap: 11 (ref 5–15)
BUN: 25 mg/dL — ABNORMAL HIGH (ref 6–20)
CO2: 25 mmol/L (ref 22–32)
Calcium: 9.3 mg/dL (ref 8.9–10.3)
Chloride: 105 mmol/L (ref 98–111)
Creatinine, Ser: 0.95 mg/dL (ref 0.61–1.24)
GFR, Estimated: >60 mL/min (ref >60)
Glucose, Bld: 110 mg/dL — ABNORMAL HIGH (ref 70–99)
Potassium: 3.6 mmol/L (ref 3.5–5.1)
Sodium: 141 mmol/L (ref 135–145)
Total Bilirubin: 1 mg/dL (ref 0.0–1.2)
Total Protein: 8.3 g/dL — ABNORMAL HIGH (ref 6.5–8.1)

## 2023-09-18 LAB — ETHANOL: Alcohol, Ethyl (B): <15 mg/dL (ref <15)

## 2023-09-18 MED ORDER — ARIPIPRAZOLE 10 MG PO TABS
10.0000 mg | ORAL_TABLET | Freq: Every day | ORAL | Status: DC
  Administered 2023-09-18 – 2023-09-19 (×2): 10 mg via ORAL
  Filled 2023-09-18 (×2): qty 1

## 2023-09-18 NOTE — ED Notes (Signed)
 Pt dressed out. 1 white t-shirt, gray sweat pants, 1 pair mismatched socks, black boxers, black sneakers, and a backpack collected from pt.

## 2023-09-18 NOTE — ED Notes (Signed)
 Patient moved to BHU 1 from main ED.  Patient well aquatinted with unit, re-enforeced rounding cameras in rooms and if any needs to come to nsg station.
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
## 2023-09-18 NOTE — ED Notes (Signed)
 Snack given.

## 2023-09-18 NOTE — ED Notes (Signed)
 [facility] called, VM left about transport not being available until tomorrow morning, 09/19/2023.

## 2023-09-18 NOTE — BH Assessment (Signed)
 Comprehensive Clinical Assessment (CCA) Note  09/18/2023 [name] [id number]  Chief Complaint:  Chief Complaint  Patient presents with   IVC   Visit Diagnosis: Schizophrenia   [name] is a 33 year old male who presents to the ER via law enforcement, under IVC. Patient was seen at [facility] and it resulted him voicing running into the streets without clothes, with the attempts of getting hit by a car to end his life. He also states he is using cocaine as a means to cope. He reports wanting help with substance use.  CCA Screening, Triage and Referral (STR)  Patient Reported Information How did you hear about us ? Self  What Is the Reason for Your Visit/Call Today? Patient paranoid and seeking help with substance use.  How Long Has This Been Causing You Problems? 1 wk - 1 month  What Do You Feel Would Help You the Most Today? Alcohol or Drug Use Treatment; Treatment for Depression or other mood problem   Have You Recently Had Any Thoughts About Hurting Yourself? No  Are You Planning to Commit Suicide/Harm Yourself At This time? No   Flowsheet Row ED from 09/18/2023 in [facility] Emergency Department at [facility] Admission (Discharged) from 09/12/2023 in [facility] ED from 09/11/2023 in [facility] Emergency Department at [facility]  C-SSRS RISK CATEGORY No Risk High Risk Error: Q3, 4, or 5 should not be populated when Q2 is No    Have you Recently Had Thoughts About Hurting Someone [name]? No  Are You Planning to Harm Someone at This Time? No  Explanation: Pt reports having thoughts of SI; denied HI.   Have You Used Any Alcohol or Drugs in the Past 24 Hours? Yes  How Long Ago Did You Use Drugs or Alcohol? Unable to quantify  What Did You Use and How Much? Cocaine   Do You Currently Have a Therapist/Psychiatrist? No  Name of Therapist/Psychiatrist:    Have You Been Recently Discharged From  Any Office Practice or Programs? No  Explanation of Discharge From Practice/Program: No data recorded    CCA Screening Triage Referral Assessment Type of Contact: Face-to-Face  Telemedicine Service Delivery:   Is this Initial or Reassessment?   Date Telepsych consult ordered in [facility]:    Time Telepsych consult ordered in [facility]:    Location of Assessment: [facility] ED  Provider Location: [facility] ED   Collateral Involvement: None provided   Does Patient Have a Court Appointed Legal Guardian? No  Legal Guardian Contact Information: N/A  Copy of Legal Guardianship Form: -- (N/A)  Legal Guardian Notified of Arrival: -- (N/A)  Legal Guardian Notified of Pending Discharge: -- (N/A)  If Minor and Not Living with Parent(s), Who has Custody? N/A  Is CPS involved or ever been involved? Never  Is APS involved or ever been involved? Never   Patient Determined To Be At Risk for Harm To Self or Others Based on Review of Patient Reported Information or Presenting Complaint? Yes, for Self-Harm  Method: No Plan  Availability of Means: No access or NA  Intent: Vague intent or NA  Notification Required: No need or identified person  Additional Information for Danger to Others Potential: -- (N/A)  Additional Comments for Danger to Others Potential: N/A  Are There Guns or Other Weapons in Your Home? No  Types of Guns/Weapons: Denies access to guns/ weapons  Are These Weapons Safely Secured?  No  Who Could Verify You Are Able To Have These Secured: Denies access to guns/ weapons  Do You Have any Outstanding Charges, Pending Court Dates, Parole/Probation? UTA  Contacted To Inform of Risk of Harm To Self or Others: -- (N/A)   Does Patient Present under Involuntary Commitment? Yes   [place] of Residence: [place]   Patient Currently Receiving the Following Services: Not Receiving Services   Determination of Need: Emergent (2 hours)   Options For  Referral: ED Visit; Inpatient Hospitalization   CCA Biopsychosocial Patient Reported Schizophrenia/Schizoaffective Diagnosis in Past: No   Strengths: Seeking help, pleasant and able to complete ADL's without assistance.   Mental Health Symptoms Depression:  Change in energy/activity; Hopelessness; Irritability   Duration of Depressive symptoms: Duration of Depressive Symptoms: Less than two weeks   Mania:  Change in energy/activity; Increased Energy; Racing thoughts   Anxiety:   Difficulty concentrating; Restlessness   Psychosis:  None   Duration of Psychotic symptoms: Duration of Psychotic Symptoms: N/A   Trauma:  N/A   Obsessions:  N/A   Compulsions:  N/A   Inattention:  N/A   Hyperactivity/Impulsivity:  N/A   Oppositional/Defiant Behaviors:  N/A   Emotional Irregularity:  N/A   Other Mood/Personality Symptoms:  None reported    Mental Status Exam Appearance and self-care  Stature:  Average   Weight:  Average weight   Clothing:  Neat/clean; Age-appropriate   Grooming:  Normal   Cosmetic use:  None   Posture/gait:  Normal   Motor activity:  -- (Within normal range)   Sensorium  Attention:  Normal   Concentration:  Normal   Orientation:  X5   Recall/memory:  Normal   Affect and Mood  Affect:  Appropriate; Full Range   Mood:  Anxious   Relating  Eye contact:  Normal   Facial expression:  Anxious; Responsive   Attitude toward examiner:  Cooperative   Thought and Language  Speech flow: Clear and Coherent; Normal   Thought content:  Appropriate to Mood and Circumstances   Preoccupation:  None   Hallucinations:  None   Organization:  Coherent; Intact   [employer] of Knowledge:  Fair   Intelligence:  Average   Abstraction:  Normal   Judgement:  Fair   Dance movement psychotherapist:  Distorted   Insight:  Poor   Decision Making:  Impulsive   Social Functioning  Social Maturity:  Impulsive   Social Judgement:  Normal;  Chief of Staff   Stress  Stressors:  Housing; Armed forces operational officer; Other (Comment)   Coping Ability:  Normal   Skill Deficits:  None   Supports:  Support needed     Religion: Religion/Spirituality Are You A Religious Person?: No  Leisure/Recreation: Leisure / Recreation Do You Have Hobbies?: No  Exercise/Diet: Exercise/Diet Do You Exercise?: No Have You Gained or Lost A Significant Amount of Weight in the Past Six Months?: No Do You Follow a Special Diet?: No Do You Have Any Trouble Sleeping?: No   CCA Employment/Education Employment/Work Situation: Employment / Work Situation Employment Situation: Unemployed Patient's Job has Been Impacted by Current Illness: No Has Patient ever Been in Equities trader?: No  Education: Education Is Patient Currently Attending School?: No Did Theme park manager?: No Did You Have An Individualized Education Program (IIEP): No Did You Have Any Difficulty At [employer]?: No Patient's Education Has Been Impacted by Current Illness: No   CCA Family/Childhood History Family and Relationship History: Family history Marital status: Single Does patient have children?:  No How many children?: 1 How is patient's relationship with their children?: Strained  Childhood History:  Childhood History By whom was/is the patient raised?: Mother Did patient suffer any verbal/emotional/physical/sexual abuse as a child?: No Did patient suffer from severe childhood neglect?: No Has patient ever been sexually abused/assaulted/raped as an adolescent or adult?: No Was the patient ever a victim of a crime or a disaster?: No Witnessed domestic violence?: No Has patient been affected by domestic violence as an adult?: No   CCA Substance Use Alcohol/Drug Use: Alcohol / Drug Use Pain Medications: See MAR Prescriptions: See MAR Over the Counter: See MAR Substance #1 Name of Substance 1: Cocaine 1 - Last Use / Amount: 09/17/2023 1- Route of Use: Oral    ASAM's:   Six Dimensions of Multidimensional Assessment  Dimension 1:  Acute Intoxication and/or Withdrawal Potential:      Dimension 2:  Biomedical Conditions and Complications:      Dimension 3:  Emotional, Behavioral, or Cognitive Conditions and Complications:     Dimension 4:  Readiness to Change:     Dimension 5:  Relapse, Continued use, or Continued Problem Potential:     Dimension 6:  Recovery/Living Environment:     ASAM Severity Score:    ASAM Recommended Level of Treatment:     Substance use Disorder (SUD)    Recommendations for Services/Supports/Treatments:    Disposition Recommendation per psychiatric provider: Inpatient Treatment   DSM5 Diagnoses: Patient Active Problem List   Diagnosis Date Noted   Intellectual developmental disorder, 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   Severe stimulant use disorder (HCC) 06/27/2019   Substance-induced psychotic disorder with delusions (HCC) 03/12/2019   Cocaine abuse with cocaine-induced mood disorder (HCC) 11/06/2018   Cannabis abuse 11/10/2017   Nonadherence to medical treatment 01/26/2017    Referrals to Alternative Service(s): Referred to Alternative Service(s):   Place:   Date:   Time:    Referred to Alternative Service(s):   Place:   Date:   Time:    Referred to Alternative Service(s):   Place:   Date:   Time:    Referred to Alternative Service(s):   Place:   Date:   Time:     Kiki DOROTHA Barge MS, LCAS, [facility], [facility] Therapeutic Triage Specialist 09/18/2023 11:06 AM

## 2023-09-18 NOTE — ED Notes (Signed)
Pt given Dinner tray

## 2023-09-18 NOTE — ED Notes (Signed)
 Pt given lunch tray by EDT [place]

## 2023-09-18 NOTE — ED Notes (Signed)
 [facility] in w/c accompanied by security guard and NT.  Belonging bags 1 and 2 of 2 transferred w/pt.

## 2023-09-18 NOTE — BH Assessment (Signed)
 TTS spoke with [facility]), informed her patient will not transfer today due to no transportation from the sheriff department. They will hold the bed till tomorrow.

## 2023-09-18 NOTE — ED Notes (Signed)
 Pt informed of issue with transportation and that he would be taken to [facility] tomorrow morning.

## 2023-09-18 NOTE — BH Assessment (Signed)
 Patient has been accepted to [facility].  Patient assigned to [facility] Accepting physician is Dr. Millie Manners.  Call report to [phone].  Representative was [employer].   ER Staff is aware of it:  [place], ER Michaela Hussar, Patient's Nurse   Address: [address] [place], [place] [address]  Bed is ready for today (09/18/2023)
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
## 2023-09-18 NOTE — ED Notes (Signed)
 Patient is IVC pending discharge to [facility] on 09-19-23

## 2023-09-18 NOTE — ED Notes (Signed)
 Pt informed of transfer to [facility]. Pt asking for something to drink, sprite provided at this time.

## 2023-09-18 NOTE — ED Notes (Signed)
 Informed by [name], pt accepted to [facility].

## 2023-09-18 NOTE — ED Notes (Signed)
 [facility] called and VM left for call back.
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
## 2023-09-18 NOTE — ED Notes (Signed)
 [facility] calling about potential placement for pt.

## 2023-09-18 NOTE — ED Notes (Signed)
Pt. Given [place] sandwich tray.

## 2023-09-19 NOTE — ED Notes (Signed)
 [facility] called for updated report, VM left for call back.

## 2023-09-19 NOTE — ED Notes (Signed)
Pt given phone to call mother

## 2023-09-19 NOTE — ED Notes (Signed)
 IVC/ Moved to [facility]/ Pending discharge to [facility].

## 2023-09-19 NOTE — ED Notes (Signed)
Pt provided with breakfast tray and juice  

## 2023-09-19 NOTE — ED Notes (Signed)
 [facility] called back, did not need report as I gave report yesterday. No new updates at this time. [facility] stating pt is good to come.

## 2023-09-27 ENCOUNTER — Ambulatory Visit: Admitting: Family Medicine
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
## 2023-10-19 ENCOUNTER — Other Ambulatory Visit: Payer: Self-pay

## 2023-10-19 ENCOUNTER — Ambulatory Visit (HOSPITAL_COMMUNITY)
Admission: EM | Admit: 2023-10-19 | Discharge: 2023-10-20 | Disposition: A | Discharge status: Home / Self Care | Attending: Psychiatry | Admitting: Psychiatry

## 2023-10-19 DIAGNOSIS — Z9152 Personal history of nonsuicidal self-harm: Secondary | ICD-10-CM | POA: Insufficient documentation

## 2023-10-19 DIAGNOSIS — Z91148 Patient's other noncompliance with medication regimen for other reason: Secondary | ICD-10-CM | POA: Insufficient documentation

## 2023-10-19 DIAGNOSIS — Z79899 Other long term (current) drug therapy: Secondary | ICD-10-CM | POA: Insufficient documentation

## 2023-10-19 DIAGNOSIS — E538 Deficiency of other specified B group vitamins: Secondary | ICD-10-CM | POA: Diagnosis not present

## 2023-10-19 DIAGNOSIS — F1914 Other psychoactive substance abuse with psychoactive substance-induced mood disorder: Secondary | ICD-10-CM | POA: Diagnosis not present

## 2023-10-19 DIAGNOSIS — F259 Schizoaffective disorder, unspecified: Secondary | ICD-10-CM | POA: Diagnosis not present

## 2023-10-19 DIAGNOSIS — F1994 Other psychoactive substance use, unspecified with psychoactive substance-induced mood disorder: Secondary | ICD-10-CM | POA: Diagnosis present

## 2023-10-19 DIAGNOSIS — Z5900 Homelessness unspecified: Secondary | ICD-10-CM | POA: Insufficient documentation

## 2023-10-19 DIAGNOSIS — R45851 Suicidal ideations: Secondary | ICD-10-CM | POA: Diagnosis not present

## 2023-10-19 LAB — COMPREHENSIVE METABOLIC PANEL WITH GFR
ALT: 16 U/L (ref 0–44)
AST: 23 U/L (ref 15–41)
Albumin: 3.8 g/dL (ref 3.5–5.0)
Alkaline Phosphatase: 64 U/L (ref 38–126)
Anion gap: 10 (ref 5–15)
BUN: 13 mg/dL (ref 6–20)
CO2: 25 mmol/L (ref 22–32)
Calcium: 9.1 mg/dL (ref 8.9–10.3)
Chloride: 104 mmol/L (ref 98–111)
Creatinine, Ser: 0.97 mg/dL (ref 0.61–1.24)
GFR, Estimated: >60 mL/min (ref >60)
Glucose, Bld: 94 mg/dL (ref 70–99)
Potassium: 3.8 mmol/L (ref 3.5–5.1)
Sodium: 139 mmol/L (ref 135–145)
Total Bilirubin: 0.6 mg/dL (ref 0.0–1.2)
Total Protein: 7.1 g/dL (ref 6.5–8.1)

## 2023-10-19 LAB — POCT URINE DRUG SCREEN - MANUAL ENTRY (I-SCREEN)
POC Amphetamine UR: NOT DETECTED
POC Buprenorphine (BUP): NOT DETECTED
POC Cocaine UR: POSITIVE — AB
POC Marijuana UR: POSITIVE — AB
POC Methadone UR: NOT DETECTED
POC Methamphetamine UR: NOT DETECTED
POC Morphine: NOT DETECTED
POC Oxazepam (BZO): NOT DETECTED
POC Oxycodone UR: NOT DETECTED
POC Secobarbital (BAR): NOT DETECTED

## 2023-10-19 LAB — CBC WITH DIFFERENTIAL/PLATELET
Abs Immature Granulocytes: 0.01 K/uL (ref 0.00–0.07)
Basophils Absolute: 0 K/uL (ref 0.0–0.1)
Basophils Relative: 0 %
Eosinophils Absolute: 0.1 K/uL (ref 0.0–0.5)
Eosinophils Relative: 1 %
HCT: 41.2 % (ref 39.0–52.0)
Hemoglobin: 13.8 g/dL (ref 13.0–17.0)
Immature Granulocytes: 0 %
Lymphocytes Relative: 39 %
Lymphs Abs: 2.3 K/uL (ref 0.7–4.0)
MCH: 28.7 pg (ref 26.0–34.0)
MCHC: 33.5 g/dL (ref 30.0–36.0)
MCV: 85.7 fL (ref 80.0–100.0)
Monocytes Absolute: 0.7 K/uL (ref 0.1–1.0)
Monocytes Relative: 12 %
Neutro Abs: 2.8 K/uL (ref 1.7–7.7)
Neutrophils Relative %: 48 %
Platelets: 297 K/uL (ref 150–400)
RBC: 4.81 MIL/uL (ref 4.22–5.81)
RDW: 13.1 % (ref 11.5–15.5)
WBC: 5.9 K/uL (ref 4.0–10.5)
nRBC: 0 % (ref 0.0–0.2)

## 2023-10-19 LAB — HEMOGLOBIN A1C
Hgb A1c MFr Bld: 5.2 % (ref 4.8–5.6)
Mean Plasma Glucose: 102.54 mg/dL

## 2023-10-19 MED ORDER — MAGNESIUM HYDROXIDE 400 MG/5ML PO SUSP: 30.0000 mL | Freq: Every day | ORAL | Status: DC | PRN

## 2023-10-19 MED ORDER — HALOPERIDOL 5 MG PO TABS: 5.0000 mg | ORAL_TABLET | Freq: Three times a day (TID) | ORAL | Status: DC | PRN

## 2023-10-19 MED ORDER — OLANZAPINE 10 MG PO TBDP
10.0000 mg | ORAL_TABLET | Freq: Every day | ORAL | Status: DC
  Administered 2023-10-19: 10 mg via ORAL
  Filled 2023-10-19: qty 1

## 2023-10-19 MED ORDER — LORAZEPAM 2 MG/ML IJ SOLN: 2.0000 mg | Freq: Three times a day (TID) | INTRAMUSCULAR | Status: DC | PRN

## 2023-10-19 MED ORDER — DIPHENHYDRAMINE HCL 50 MG/ML IJ SOLN: 50.0000 mg | Freq: Three times a day (TID) | INTRAMUSCULAR | Status: DC | PRN

## 2023-10-19 MED ORDER — ALUM & MAG HYDROXIDE-SIMETH 200-200-20 MG/5ML PO SUSP: 30.0000 mL | ORAL | Status: DC | PRN

## 2023-10-19 MED ORDER — OLANZAPINE 5 MG PO TBDP
5.0000 mg | ORAL_TABLET | Freq: Two times a day (BID) | ORAL | Status: DC
  Administered 2023-10-19 – 2023-10-20 (×2): 5 mg via ORAL
  Filled 2023-10-19 (×2): qty 1

## 2023-10-19 MED ORDER — HYDROXYZINE HCL 25 MG PO TABS: 25.0000 mg | ORAL_TABLET | Freq: Three times a day (TID) | ORAL | Status: DC | PRN

## 2023-10-19 MED ORDER — HALOPERIDOL LACTATE 5 MG/ML IJ SOLN: 5.0000 mg | Freq: Three times a day (TID) | INTRAMUSCULAR | Status: DC | PRN

## 2023-10-19 MED ORDER — HALOPERIDOL LACTATE 5 MG/ML IJ SOLN: 10.0000 mg | Freq: Three times a day (TID) | INTRAMUSCULAR | Status: DC | PRN

## 2023-10-19 MED ORDER — TRAZODONE HCL 50 MG PO TABS: 50.0000 mg | ORAL_TABLET | Freq: Every evening | ORAL | Status: DC | PRN

## 2023-10-19 MED ORDER — DIPHENHYDRAMINE HCL 50 MG PO CAPS: 50.0000 mg | ORAL_CAPSULE | Freq: Three times a day (TID) | ORAL | Status: DC | PRN

## 2023-10-19 MED ORDER — ACETAMINOPHEN 325 MG PO TABS: 650.0000 mg | ORAL_TABLET | Freq: Four times a day (QID) | ORAL | Status: DC | PRN

## 2023-10-19 NOTE — ED Notes (Signed)
 Patient resting in lounger with eyes closed, respirations even and unlabored. Patient in no apparent acute distress. Environment secured. Safety checks in place per facility protocol.

## 2023-10-19 NOTE — ED Notes (Addendum)
 Pt resting at this hour. No apparent distress. RR even and unlabored. Endorses passive SI without a plan. Contracts for safety. Denies HI, VH. Endorses AH. Denies pain. Endorses depression 8/10 and denies anxiety. Monitored for safety.

## 2023-10-19 NOTE — ED Notes (Signed)
 Pt asleep at this hour. No apparent distress. RR even and unlabored. Monitored for safety.

## 2023-10-19 NOTE — ED Notes (Signed)
 Patient alert & oriented x4. Patient endorses passive SI, denies intent or plan. Denies HI when asked. Denies A/VH. Patient denies any physical complaints when asked. No acute distress noted. Support and encouragement provided. Patient observed in milieu. No inappropriate behaviors observed or reported. Patient has slept majority of shift as this is the first time patient has been awake for assessment. Routine safety checks conducted per facility protocol. Encouraged patient to notify staff if any thoughts of harm towards self or others arise. Patient verbalizes understanding and agreement.

## 2023-10-19 NOTE — Progress Notes (Signed)
 LCSW Progress Note  [id number]   [name]  10/19/2023  8:21 AM  Description:   Inpatient Psychiatric Referral  Patient was recommended inpatient per Suzen Lesches (NP). There are no available beds at [facility], per [facility] Carlo RN). Patient was referred to the following out of network facilities:   Destination  Service Provider Address Phone Fax  [facility]  [address] [place], [place] [address] [phone]  [facility]  [address] [place] [address] [phone]  [facility]  [address] [place] [address] [phone]  [facility]  [address], [place] [address] [phone]  [facility] EFAX  [address] [place], [place] [phone]  [facility]  [address] [place] [address] [phone]      Situation ongoing, CSW to continue following and update chart as more information becomes available.      [name] MSW, LCSW  10/19/2023 8:21 AM

## 2023-10-19 NOTE — ED Notes (Signed)
 Writer attempted to awaken patient to complete assessment. Patient did not awaken despite many tries. Patient resting with eyes closed and respirations even and unlabored. Environment secured, safety checks in place per facility policy.

## 2023-10-19 NOTE — Progress Notes (Signed)
 Meal given

## 2023-10-19 NOTE — BH Assessment (Signed)
 Comprehensive Clinical Assessment (CCA) Note  10/19/2023 [name] [id number]  Chief Complaint:  Chief Complaint  Patient presents with   suicidal ideation  Disposition: Per Jon McLauchlin,NP patient is recommended for overnight observation with re-evaluation in the morning.   The patient demonstrates the following risk factors for suicide: Chronic risk factors for suicide include: psychiatric disorder of IDD,cocaine abuse, cannabis abuse. Acute risk factors for suicide include: N/A. Protective factors for this patient include: hope for the future. Considering these factors, the overall suicide risk at this point appears to be moderate. Patient is appropriate for outpatient follow up.   Patient is a 33 year old male with a history of IDD,cocaine abuse, cannabis abuse who presents voluntarily, escorted by [employer],[facility] for [facility]. Patient resides in the home with his mother [name] and identifies her as his primary support system.Patient reports isolation, crying spells, irritability, hopelessness, guilt, loss of interest to do things they enjoy, fatigue, lack of concentration, and worthlessness. Patient reports history of 1 past suicide attempt, he reports it was a while a go where he cut himself.  He was unable to elaborate further. Patient has a hx of Substance Abuse:cocaine and marijuana. Last use was 3 days ago,per his report. Patient was a poor historian and was unable to provider further information about how he encountered the police and what was going on prior to his arrival. Patient states he is experiencing SI and thought of a plan to go to his friends  house and steal a gun to harm himself. Patient states he does not have access to weapons in his home. He reports auditory hallucinations hearing voices talking to him and telling him to run away from the people who are trying to get him. He reports paranoia feeling like people are watching him and  after him. He denies history of abuse or trauma. He denies current legal concerns. He denies visual hallucinations.Patient denies NSSIB, HI.  Patient was unable to identify specific triggers for his SI. He states he is not established with outpatient therapy or psychiatry services and is not prescribed any medication at this time. Patient has mumbled speech and hold his head down during the assessment, Patient avoided eye contact. He is unkempt and appears to be under the influence.   Visit Diagnosis:   Major Depressive disorder Suicidal Ideation   CCA Screening, Triage and Referral (STR)  Patient Reported Information How did you hear about us ? Legal System  What Is the Reason for Your Visit/Call Today? Per triage note Pt presents to [facility] as a voluntary walk-in, accompanied by [employer] with complaint of SI, without plan or intent and medication consultation. Pt was at a local store and picked up by [employer]. Pt was not very forthcoming with information as this writer had to redirect him numerous times for mumbling and not speaking clearly. Pt report history of schizoaffective disorder. Pt reports that he is unable to remember what medications he takes. Pt currently denies HI,AVH.  How Long Has This Been Causing You Problems? <Week  What Do You Feel Would Help You the Most Today? Treatment for Depression or other mood problem   Have You Recently Had Any Thoughts About Hurting Yourself? Yes  Are You Planning to Commit Suicide/Harm Yourself At This time? No   Flowsheet Row ED from 10/19/2023 in [facility] ED from 09/18/2023 in [facility] (Discharged[facility]  C-SSRS RISK CATEGORY Moderate Risk No Risk High Risk    Have you Recently Had Thoughts About Hurting Someone [place]? No  Are You Planning to Harm Someone at This Time? No  Explanation: n/a   Have You  Used Any Alcohol or Drugs in the Past 24 Hours? No  How Long Ago Did You Use Drugs or Alcohol? N/a What Did You Use and How Much? N/a  Do You Currently Have a Therapist/Psychiatrist? No  Name of Therapist/Psychiatrist:    Have You Been Recently Discharged From Any Office Practice or Programs? No  Explanation of Discharge From Practice/Program: n/a    CCA Screening Triage Referral Assessment Type of Contact: Face-to-Face  Telemedicine Service Delivery:   Is this Initial or Reassessment?   Date Telepsych consult ordered in [facility]:    Time Telepsych consult ordered in [facility]:    Location of Assessment: [facility]  Provider Location: [facility]   Collateral Involvement: None provided   Does Patient Have a Automotive engineer Guardian? No  Legal Guardian Contact Information: n/a  Copy of Legal Guardianship Form: -- (n/a)  Legal Guardian Notified of Arrival: -- (n/a)  Legal Guardian Notified of Pending Discharge: -- (n/a)  If Minor and Not Living with Parent(s), Who has Custody? n/a  Is CPS involved or ever been involved? Never  Is APS involved or ever been involved? Never   Patient Determined To Be At Risk for Harm To Self or Others Based on Review of Patient Reported Information or Presenting Complaint? Yes, for Self-Harm  Method: Plan without intent  Availability of Means: No access or NA  Intent: Vague intent or NA  Notification Required: No need or identified person  Additional Information for Danger to Others Potential: -- (n/a)  Additional Comments for Danger to Others Potential: n/a  Are There Guns or Other Weapons in Your Home? No  Types of Guns/Weapons: Denies access to guns/ weapons  Are These Weapons Safely Secured?                            No  Who Could Verify You Are Able To Have These Secured: Denies access to guns/ weapons  Do You Have any Outstanding Charges, Pending Court Dates, Parole/Probation?  n/a  Contacted To Inform of Risk of Harm To Self or Others: Law Enforcement    Does Patient Present under Involuntary Commitment? No    [place] of Residence: [place]   Patient Currently Receiving the Following Services: Not Receiving Services   Determination of Need: Urgent (48 hours)   Options For Referral: Medication Management; Outpatient Therapy; BH Urgent Care     CCA Biopsychosocial Patient Reported Schizophrenia/Schizoaffective Diagnosis in Past: No   Strengths: Seeking help, pleasant and able to complete ADL's without assistance.   Mental Health Symptoms Depression:  Change in energy/activity; Hopelessness; Irritability; Difficulty Concentrating; Fatigue; Tearfulness; Worthlessness   Duration of Depressive symptoms: Duration of Depressive Symptoms: Greater than two weeks   Mania:  Change in energy/activity; Increased Energy; Racing thoughts   Anxiety:   Difficulty concentrating; Restlessness   Psychosis:  None   Duration of Psychotic symptoms:    Trauma:  N/A   Obsessions:  N/A   Compulsions:  N/A   Inattention:  N/A   Hyperactivity/Impulsivity:  N/A   Oppositional/Defiant Behaviors:  N/A   Emotional Irregularity:  N/A   Other Mood/Personality Symptoms:  None reported    Mental Status Exam Appearance  and self-care  Stature:  Average   Weight:  Average weight   Clothing:  Neat/clean; Age-appropriate   Grooming:  Normal   Cosmetic use:  None   Posture/gait:  Normal   Motor activity:  Slowed   Sensorium  Attention:  Inattentive   Concentration:  Normal   Orientation:  X5   Recall/memory:  Normal   Affect and Mood  Affect:  Flat   Mood:  Depressed   Relating  Eye contact:  Avoided   Facial expression:  Constricted   Attitude toward examiner:  Cooperative; Guarded   Thought and Language  Speech flow: Slow; Soft   Thought content:  Appropriate to Mood and Circumstances   Preoccupation:  None   Hallucinations:   None   Organization:  Intact   [employer] of Knowledge:  Fair   Intelligence:  Average   Abstraction:  Normal   Judgement:  Impaired   Reality Testing:  Distorted   Insight:  Poor   Decision Making:  Impulsive   Social Functioning  Social Maturity:  Impulsive   Social Judgement:  Chief of Staff   Stress  Stressors:  Other (Comment) (n/a)   Coping Ability:  Overwhelmed   Skill Deficits:  Self-care   Supports:  Support needed; Family     Religion: Religion/Spirituality Are You A Religious Person?: No How Might This Affect Treatment?: n/a  Leisure/Recreation: Leisure / Recreation Do You Have Hobbies?: No  Exercise/Diet: Exercise/Diet Do You Exercise?: No Have You Gained or Lost A Significant Amount of Weight in the Past Six Months?: No Do You Follow a Special Diet?: No Do You Have Any Trouble Sleeping?: No   CCA Employment/Education Employment/Work Situation: Employment / Work Situation Employment Situation: Unemployed Patient's Job has Been Impacted by Current Illness: No Has Patient ever Been in Equities trader?: No  Education: Education Is Patient Currently Attending School?: No Last Grade Completed: 12 Did You Product manager?: No Did You Have An Individualized Education Program (IIEP): No Did You Have Any Difficulty At [employer]?: No Patient's Education Has Been Impacted by Current Illness: No   CCA Family/Childhood History Family and Relationship History: Family history Marital status: Single Does patient have children?: No  Childhood History:  Childhood History By whom was/is the patient raised?: Mother Did patient suffer any verbal/emotional/physical/sexual abuse as a child?: No Did patient suffer from severe childhood neglect?: No Has patient ever been sexually abused/assaulted/raped as an adolescent or adult?: No Was the patient ever a victim of a crime or a disaster?: No Witnessed domestic violence?: No Has patient  been affected by domestic violence as an adult?: No       CCA Substance Use Alcohol/Drug Use: Alcohol / Drug Use Pain Medications: See MAR Prescriptions: See MAR Over the Counter: See MAR History of alcohol / drug use?: Yes Longest period of sobriety (when/how long): Unable to quantify Negative Consequences of Use:  (n/a) Withdrawal Symptoms:  (Reports of none)                         ASAM's:  Six Dimensions of Multidimensional Assessment  Dimension 1:  Acute Intoxication and/or Withdrawal Potential:      Dimension 2:  Biomedical Conditions and Complications:      Dimension 3:  Emotional, Behavioral, or Cognitive Conditions and Complications:     Dimension 4:  Readiness to Change:     Dimension 5:  Relapse, Continued use, or Continued Problem Potential:  Dimension 6:  Recovery/Living Environment:     ASAM Severity Score:    ASAM Recommended Level of Treatment: ASAM Recommended Level of Treatment: Level III Residential Treatment   Substance use Disorder (SUD) Substance Use Disorder (SUD)  Checklist Symptoms of Substance Use: Continued use despite having a persistent/recurrent physical/psychological problem caused/exacerbated by use, Continued use despite persistent or recurrent social, interpersonal problems, caused or exacerbated by use, Evidence of tolerance  Recommendations for Services/Supports/Treatments: Recommendations for Services/Supports/Treatments Recommendations For Services/Supports/Treatments: Medication Management, Individual Therapy, Inpatient Hospitalization  Disposition Recommendation per psychiatric provider: Patient is recommended for overnight observation with re-evaluation in the morning.    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

## 2023-10-19 NOTE — ED Provider Notes (Cosign Needed Addendum)
 [facility] Continuous Assessment Admission H&P  Date: 10/19/23 Patient Name: [name] MRN: [id number] Chief Complaint: SI  Diagnoses:  Final diagnoses:  Suicidal ideation  Substance induced mood disorder (HCC)    Per, Suzen Lesches, NP , 10/19/23, 7:30 AM patient meets criteria for inpatient psychiatric treatment, per admission and current presentation. Patient also meets [place] IVC criteria, will be IVC if attempts to leave.  Addendum signed by, Suzen Lesches, NP   HPI: [name] 33 y.o., male presented to [facility] as a voluntary walk-in accompanied by [facility] ([facility]) with complaints of suicidal ideation.  [name], 33 y.o. male patient, was seen face-to-face by this provider,  and chart reviewed on 10/19/2023.  On evaluation, the patient reports suicidal ideation with a plan to shoot himself, although he acknowledges he does not own a firearm. He endorses a history of schizoaffective disorder and reports increasing paranoia and auditory hallucinations, describing that "people are after me" and that he hears voices reinforcing this fear.  He is unable to recall his current medications, admitting that he has not been taking them. The patient also reports substance use, including cocaine 2-3 times per week and daily use of synthetic marijuana.  Patient currently denies homicidal ideation and visual hallucinations, though he continues to experience auditory hallucinations and paranoid thoughts. He appears tired, slow to respond, and mumbles when speaking. He denies recent medical complaints.   Total Time spent with patient: 30 minutes  Musculoskeletal  Strength & Muscle Tone: within normal limits Gait & Station: normal Patient leans: Front  Psychiatric Specialty Exam  Presentation General Appearance:  Disheveled  Eye Contact: Fleeting  Speech: Slow; Slurred  Speech Volume: Decreased  Handedness: Right   Mood and Affect   Mood: Depressed  Affect: Congruent   Thought Process  Thought Processes: Goal Directed  Descriptions of Associations:Intact  Orientation:Full (Time, Place and Person)  Thought Content:WDL  Diagnosis of Schizophrenia or Schizoaffective disorder in past: No   Hallucinations:Hallucinations: Auditory Description of Auditory Hallucinations: voices telling him what to do  Ideas of Reference:None  Suicidal Thoughts:Suicidal Thoughts: Yes, Passive SI Passive Intent and/or Plan: With Plan (will shoot self)  Homicidal Thoughts:Homicidal Thoughts: No   Sensorium  Memory: Immediate Fair  Judgment: Fair  Insight: Fair   Executive Functions  Concentration: Poor  Attention Span: Poor  Recall: [employer] of Knowledge: Fair  Language: Fair   Psychomotor Activity  Psychomotor Activity: Psychomotor Activity: Normal   Assets  Assets: Social Support; Desire for Improvement; Housing   Sleep  Sleep: Sleep: Good   Nutritional Assessment (For OBS and FBC admissions only) Has the patient had a weight loss or gain of 10 pounds or more in the last 3 months?: No Has the patient had a decrease in food intake/or appetite?: No Does the patient have dental problems?: No Does the patient have eating habits or behaviors that may be indicators of an eating disorder including binging or inducing vomiting?: No Has the patient recently lost weight without trying?: 0 Has the patient been eating poorly because of a decreased appetite?: 0 Malnutrition Screening Tool Score: 0    Physical Exam ROS  Blood pressure 115/74, pulse 70, temperature 98.6 F (37 C), temperature source Oral, resp. rate 16, SpO2 99%. There is no height or weight on file to calculate BMI.  Past Psychiatric History: Schizoaffective   Is the patient at risk to self? Yes  Has the patient been a risk to self in the  past 6 months? No .    Has the patient been a risk to self within the distant past?  No   Is the patient a risk to others? No   Has the patient been a risk to others in the past 6 months? No   Has the patient been a risk to others within the distant past? No   Past Medical History: B12 deficiency  Family History: unknown  Social History: substance abuse  Last Labs:  Admission on 09/18/2023, Discharged on 09/19/2023  Component Date Value Ref Range Status   Sodium 09/18/2023 141  135 - 145 mmol/L Final   Potassium 09/18/2023 3.6  3.5 - 5.1 mmol/L Final   Chloride 09/18/2023 105  98 - 111 mmol/L Final   CO2 09/18/2023 25  22 - 32 mmol/L Final   Glucose, Bld 09/18/2023 110 (H)  70 - 99 mg/dL Final   Glucose reference range applies only to samples taken after fasting for at least 8 hours.   BUN 09/18/2023 25 (H)  6 - 20 mg/dL Final   Creatinine, Ser 09/18/2023 0.95  0.61 - 1.24 mg/dL Final   Calcium 90/93/7974 9.3  8.9 - 10.3 mg/dL Final   Total Protein 90/93/7974 8.3 (H)  6.5 - 8.1 g/dL Final   Albumin 90/93/7974 4.3  3.5 - 5.0 g/dL Final   AST 90/93/7974 27  15 - 41 U/L Final   ALT 09/18/2023 32  0 - 44 U/L Final   Alkaline Phosphatase 09/18/2023 70  38 - 126 U/L Final   Total Bilirubin 09/18/2023 1.0  0.0 - 1.2 mg/dL Final   GFR, Estimated 09/18/2023 >60  >60 mL/min Final   Comment: (NOTE) Calculated using the CKD-EPI Creatinine Equation (2021)    Anion gap 09/18/2023 11  5 - 15 Final   Performed at [facility], [address] [place], [place] [address]   Alcohol, Ethyl (B) 09/18/2023 <15  <15 mg/dL Final   Comment: (NOTE) For medical purposes only. Performed at [facility], [address] [place], [place] [address]    WBC 09/18/2023 9.1  4.0 - 10.5 K/uL Final   RBC 09/18/2023 4.89  4.22 - 5.81 MIL/uL Final   Hemoglobin 09/18/2023 14.4  13.0 - 17.0 g/dL Final   HCT 90/93/7974 42.3  39.0 - 52.0 % Final   MCV 09/18/2023 86.5  80.0 - 100.0 fL Final   MCH 09/18/2023 29.4  26.0 - 34.0 pg Final   MCHC 09/18/2023 34.0  30.0 - 36.0 g/dL  Final   RDW 90/93/7974 13.2  11.5 - 15.5 % Final   Platelets 09/18/2023 235  150 - 400 K/uL Final   nRBC 09/18/2023 0.0  0.0 - 0.2 % Final   Performed at [facility], [address] [place], [place] [address]  Admission on 09/11/2023, Discharged on 09/12/2023  Component Date Value Ref Range Status   Sodium 09/11/2023 139  135 - 145 mmol/L Final   Potassium 09/11/2023 4.0  3.5 - 5.1 mmol/L Final   Chloride 09/11/2023 103  98 - 111 mmol/L Final   CO2 09/11/2023 25  22 - 32 mmol/L Final   Glucose, Bld 09/11/2023 97  70 - 99 mg/dL Final   Glucose reference range applies only to samples taken after fasting for at least 8 hours.   BUN 09/11/2023 16  6 - 20 mg/dL Final   Creatinine, Ser 09/11/2023 0.75  0.61 - 1.24 mg/dL Final   Calcium 91/69/7974 9.5  8.9 - 10.3 mg/dL Final  Total Protein 09/11/2023 7.8  6.5 - 8.1 g/dL Final   Albumin 91/69/7974 4.3  3.5 - 5.0 g/dL Final   AST 91/69/7974 22  15 - 41 U/L Final   ALT 09/11/2023 16  0 - 44 U/L Final   Alkaline Phosphatase 09/11/2023 65  38 - 126 U/L Final   Total Bilirubin 09/11/2023 0.5  0.0 - 1.2 mg/dL Final   GFR, Estimated 09/11/2023 >60  >60 mL/min Final   Comment: (NOTE) Calculated using the CKD-EPI Creatinine Equation (2021)    Anion gap 09/11/2023 11  5 - 15 Final   Performed at [facility], [address] [place], [place] [address]   Alcohol, Ethyl (B) 09/11/2023 <15  <15 mg/dL Final   Comment: (NOTE) For medical purposes only. Performed at [facility], [address] [place], [place] [address]    WBC 09/11/2023 6.8  4.0 - 10.5 K/uL Final   RBC 09/11/2023 4.99  4.22 - 5.81 MIL/uL Final   Hemoglobin 09/11/2023 14.6  13.0 - 17.0 g/dL Final   HCT 91/69/7974 43.2  39.0 - 52.0 % Final   MCV 09/11/2023 86.6  80.0 - 100.0 fL Final   MCH 09/11/2023 29.3  26.0 - 34.0 pg Final   MCHC 09/11/2023 33.8  30.0 - 36.0 g/dL Final   RDW 91/69/7974 13.2  11.5 - 15.5 % Final   Platelets 09/11/2023 296  150 -  400 K/uL Final   nRBC 09/11/2023 0.0  0.0 - 0.2 % Final   Performed at [facility], [address] [place], [place] [address]   Tricyclic, Ur Screen 09/11/2023 NONE DETECTED  NONE DETECTED Final   Amphetamines, Ur Screen 09/11/2023 NONE DETECTED  NONE DETECTED Final   MDMA (Ecstasy)Ur Screen 09/11/2023 NONE DETECTED  NONE DETECTED Final   Cocaine Metabolite,Ur [place] 09/11/2023 POSITIVE (A)  NONE DETECTED Final   Opiate, Ur Screen 09/11/2023 NONE DETECTED  NONE DETECTED Final   Phencyclidine (PCP) Ur S 09/11/2023 NONE DETECTED  NONE DETECTED Final   Cannabinoid 50 Ng, Ur [place] 09/11/2023 NONE DETECTED  NONE DETECTED Final   Barbiturates, Ur Screen 09/11/2023 NONE DETECTED  NONE DETECTED Final   Benzodiazepine, Ur Scrn 09/11/2023 NONE DETECTED  NONE DETECTED Final   Methadone Scn, Ur 09/11/2023 NONE DETECTED  NONE DETECTED Final   Comment: (NOTE) Tricyclics + metabolites, urine    Cutoff 1000 ng/mL Amphetamines + metabolites, urine  Cutoff 1000 ng/mL MDMA (Ecstasy), urine              Cutoff 500 ng/mL Cocaine Metabolite, urine          Cutoff 300 ng/mL Opiate + metabolites, urine        Cutoff 300 ng/mL Phencyclidine (PCP), urine         Cutoff 25 ng/mL Cannabinoid, urine                 Cutoff 50 ng/mL Barbiturates + metabolites, urine  Cutoff 200 ng/mL Benzodiazepine, urine              Cutoff 200 ng/mL Methadone, urine                   Cutoff 300 ng/mL  The urine drug screen provides only a preliminary, unconfirmed analytical test result and should not be used for non-medical purposes. Clinical consideration and professional judgment should be applied to any positive drug screen result due to possible interfering substances. A more specific alternate chemical method must be used in order to  obtain a confirmed analytical result. Gas chromatography / mass spectrometry (GC/MS) is the preferred confirm                          atory method. Performed at [facility],  [address] [place], [place] [address]    Lithium  Lvl 09/11/2023 <0.10 (L)  0.60 - 1.20 mmol/L Final   Performed at [facility], [address] [place], [place] [address]   Valproic Acid  Lvl 09/11/2023 <10 (L)  50 - 100 ug/mL Final   Performed at [facility], [address] [place] [place], [place] [address]  Admission on 08/17/2023, Discharged on 08/25/2023  Component Date Value Ref Range Status   WBC 08/20/2023 7.1  4.0 - 10.5 K/uL Final   RBC 08/20/2023 4.55  4.22 - 5.81 MIL/uL Final   Hemoglobin 08/20/2023 13.4  13.0 - 17.0 g/dL Final   HCT 91/91/7974 41.8  39.0 - 52.0 % Final   MCV 08/20/2023 91.9  80.0 - 100.0 fL Final   MCH 08/20/2023 29.5  26.0 - 34.0 pg Final   MCHC 08/20/2023 32.1  30.0 - 36.0 g/dL Final   RDW 91/91/7974 13.6  11.5 - 15.5 % Final   Platelets 08/20/2023 234  150 - 400 K/uL Final   nRBC 08/20/2023 0.0  0.0 - 0.2 % Final   Neutrophils Relative % 08/20/2023 57  % Final   Neutro Abs 08/20/2023 4.0  1.7 - 7.7 K/uL Final   Lymphocytes Relative 08/20/2023 31  % Final   Lymphs Abs 08/20/2023 2.2  0.7 - 4.0 K/uL Final   Monocytes Relative 08/20/2023 11  % Final   Monocytes Absolute 08/20/2023 0.8  0.1 - 1.0 K/uL Final   Eosinophils Relative 08/20/2023 1  % Final   Eosinophils Absolute 08/20/2023 0.1  0.0 - 0.5 K/uL Final   Basophils Relative 08/20/2023 0  % Final   Basophils Absolute 08/20/2023 0.0  0.0 - 0.1 K/uL Final   Immature Granulocytes 08/20/2023 0  % Final   Abs Immature Granulocytes 08/20/2023 0.02  0.00 - 0.07 K/uL Final   Performed at [facility], [address] [place], [place] [address]  Admission on 08/13/2023, Discharged on 08/17/2023  Component Date Value Ref Range Status   Sodium 08/13/2023 139  135 - 145 mmol/L Final   Potassium 08/13/2023 3.4 (L)  3.5 - 5.1 mmol/L Final   Chloride 08/13/2023 104  98 - 111 mmol/L Final   CO2 08/13/2023 24  22 - 32 mmol/L Final   Glucose, Bld 08/13/2023 94  70 - 99  mg/dL Final   Glucose reference range applies only to samples taken after fasting for at least 8 hours.   BUN 08/13/2023 20  6 - 20 mg/dL Final   Creatinine, Ser 08/13/2023 0.88  0.61 - 1.24 mg/dL Final   Calcium 91/98/7974 9.0  8.9 - 10.3 mg/dL Final   Total Protein 91/98/7974 7.0  6.5 - 8.1 g/dL Final   Albumin 91/98/7974 3.8  3.5 - 5.0 g/dL Final   AST 91/98/7974 18  15 - 41 U/L Final   ALT 08/13/2023 25  0 - 44 U/L Final   Alkaline Phosphatase 08/13/2023 71  38 - 126 U/L Final   Total Bilirubin 08/13/2023 0.6  0.0 - 1.2 mg/dL Final   GFR, Estimated 08/13/2023 >60  >60 mL/min Final   Comment: (NOTE) Calculated using the CKD-EPI Creatinine Equation (2021)    Anion gap 08/13/2023 11  5 -  15 Final   Performed at [facility], [address] [place], [place] [address]   Tricyclic, Ur Screen 08/13/2023 NONE DETECTED  NONE DETECTED Final   Amphetamines, Ur Screen 08/13/2023 NONE DETECTED  NONE DETECTED Final   MDMA (Ecstasy)Ur Screen 08/13/2023 NONE DETECTED  NONE DETECTED Final   Cocaine Metabolite,Ur [place] 08/13/2023 POSITIVE (A)  NONE DETECTED Final   Opiate, Ur Screen 08/13/2023 NONE DETECTED  NONE DETECTED Final   Phencyclidine (PCP) Ur S 08/13/2023 NONE DETECTED  NONE DETECTED Final   Cannabinoid 50 Ng, Ur [place] 08/13/2023 POSITIVE (A)  NONE DETECTED Final   Barbiturates, Ur Screen 08/13/2023 NONE DETECTED  NONE DETECTED Final   Benzodiazepine, Ur Scrn 08/13/2023 NONE DETECTED  NONE DETECTED Final   Methadone Scn, Ur 08/13/2023 NONE DETECTED  NONE DETECTED Final   Comment: (NOTE) Tricyclics + metabolites, urine    Cutoff 1000 ng/mL Amphetamines + metabolites, urine  Cutoff 1000 ng/mL MDMA (Ecstasy), urine              Cutoff 500 ng/mL Cocaine Metabolite, urine          Cutoff 300 ng/mL Opiate + metabolites, urine        Cutoff 300 ng/mL Phencyclidine (PCP), urine         Cutoff 25 ng/mL Cannabinoid, urine                 Cutoff 50 ng/mL Barbiturates + metabolites, urine   Cutoff 200 ng/mL Benzodiazepine, urine              Cutoff 200 ng/mL Methadone, urine                   Cutoff 300 ng/mL  The urine drug screen provides only a preliminary, unconfirmed analytical test result and should not be used for non-medical purposes. Clinical consideration and professional judgment should be applied to any positive drug screen result due to possible interfering substances. A more specific alternate chemical method must be used in order to obtain a confirmed analytical result. Gas chromatography / mass spectrometry (GC/MS) is the preferred confirm                          atory method. Performed at [facility], [address] [place], [place] [address]    WBC 08/13/2023 7.7  4.0 - 10.5 K/uL Final   RBC 08/13/2023 4.19 (L)  4.22 - 5.81 MIL/uL Final   Hemoglobin 08/13/2023 12.5 (L)  13.0 - 17.0 g/dL Final   HCT 91/98/7974 36.7 (L)  39.0 - 52.0 % Final   MCV 08/13/2023 87.6  80.0 - 100.0 fL Final   MCH 08/13/2023 29.8  26.0 - 34.0 pg Final   MCHC 08/13/2023 34.1  30.0 - 36.0 g/dL Final   RDW 91/98/7974 13.6  11.5 - 15.5 % Final   Platelets 08/13/2023 302  150 - 400 K/uL Final   nRBC 08/13/2023 0.0  0.0 - 0.2 % Final   Neutrophils Relative % 08/13/2023 67  % Final   Neutro Abs 08/13/2023 5.2  1.7 - 7.7 K/uL Final   Lymphocytes Relative 08/13/2023 24  % Final   Lymphs Abs 08/13/2023 1.8  0.7 - 4.0 K/uL Final   Monocytes Relative 08/13/2023 9  % Final   Monocytes Absolute 08/13/2023 0.7  0.1 - 1.0 K/uL Final   Eosinophils Relative 08/13/2023 0  % Final   Eosinophils Absolute 08/13/2023 0.0  0.0 - 0.5 K/uL Final  Basophils Relative 08/13/2023 0  % Final   Basophils Absolute 08/13/2023 0.0  0.0 - 0.1 K/uL Final   Immature Granulocytes 08/13/2023 0  % Final   Abs Immature Granulocytes 08/13/2023 0.03  0.00 - 0.07 K/uL Final   Performed at [facility], [address] [place], [place] [address]   Salicylate Lvl 08/13/2023 <7.0 (L)  7.0 - 30.0  mg/dL Final   Performed at [facility], [address] [place], [place] [address]   Acetaminophen  (Tylenol ), Serum 08/13/2023 <10 (L)  10 - 30 ug/mL Final   Comment: (NOTE) Therapeutic concentrations vary significantly. A range of 10-30 ug/mL  may be an effective concentration for many patients. However, some  are best treated at concentrations outside of this range. Acetaminophen  concentrations >150 ug/mL at 4 hours after ingestion  and >50 ug/mL at 12 hours after ingestion are often associated with  toxic reactions.  Performed at [facility], [address] [place], [place] [address]    Lithium  Lvl 08/13/2023 <0.06 (L)  0.60 - 1.20 mmol/L Final   Performed at [facility], [address] [place], [place] [address]   Alcohol, Ethyl (B) 08/13/2023 <15  <15 mg/dL Final   Comment: (NOTE) For medical purposes only. Performed at [facility], [address] [place], [place] [address]   Admission on 08/08/2023, Discharged on 08/08/2023  Component Date Value Ref Range Status   Sodium 08/08/2023 140  135 - 145 mmol/L Final   Potassium 08/08/2023 3.0 (L)  3.5 - 5.1 mmol/L Final   Chloride 08/08/2023 109  98 - 111 mmol/L Final   CO2 08/08/2023 23  22 - 32 mmol/L Final   Glucose, Bld 08/08/2023 129 (H)  70 - 99 mg/dL Final   Glucose reference range applies only to samples taken after fasting for at least 8 hours.   BUN 08/08/2023 16  6 - 20 mg/dL Final   Creatinine, Ser 08/08/2023 0.90  0.61 - 1.24 mg/dL Final   Calcium 92/72/7974 8.8 (L)  8.9 - 10.3 mg/dL Final   Total Protein 92/72/7974 7.3  6.5 - 8.1 g/dL Final   Albumin 92/72/7974 3.7  3.5 - 5.0 g/dL Final   AST 92/72/7974 32  15 - 41 U/L Final   ALT 08/08/2023 59 (H)  0 - 44 U/L Final   Alkaline Phosphatase 08/08/2023 75  38 - 126 U/L Final   Total Bilirubin 08/08/2023 0.3  0.0 - 1.2 mg/dL Final   GFR, Estimated 08/08/2023 >60  >60 mL/min Final   Comment: (NOTE) Calculated using the  CKD-EPI Creatinine Equation (2021)    Anion gap 08/08/2023 8  5 - 15 Final   Performed at [facility], [address] [place], [place] [address]   Alcohol, Ethyl (B) 08/08/2023 <15  <15 mg/dL Final   Comment: (NOTE) For medical purposes only. Performed at [facility], [address] [place], [place] [address]    WBC 08/08/2023 7.6  4.0 - 10.5 K/uL Final   RBC 08/08/2023 4.00 (L)  4.22 - 5.81 MIL/uL Final   Hemoglobin 08/08/2023 12.0 (L)  13.0 - 17.0 g/dL Final   HCT 92/72/7974 35.5 (L)  39.0 - 52.0 % Final   MCV 08/08/2023 88.8  80.0 - 100.0 fL Final   MCH 08/08/2023 30.0  26.0 - 34.0 pg Final   MCHC 08/08/2023 33.8  30.0 - 36.0 g/dL Final   RDW 92/72/7974 13.2  11.5 - 15.5 % Final   Platelets 08/08/2023 260  150 - 400 K/uL Final   nRBC 08/08/2023 0.0  0.0 - 0.2 % Final   Performed at [facility], [address] [place], [place] [address]   Tricyclic, Ur Screen 08/08/2023 NONE DETECTED  NONE DETECTED Final   Amphetamines, Ur Screen 08/08/2023 NONE DETECTED  NONE DETECTED Final   MDMA (Ecstasy)Ur Screen 08/08/2023 NONE DETECTED  NONE DETECTED Final   Cocaine Metabolite,Ur [place] 08/08/2023 POSITIVE (A)  NONE DETECTED Final   Opiate, Ur Screen 08/08/2023 NONE DETECTED  NONE DETECTED Final   Phencyclidine (PCP) Ur S 08/08/2023 NONE DETECTED  NONE DETECTED Final   Cannabinoid 50 Ng, Ur [place] 08/08/2023 POSITIVE (A)  NONE DETECTED Final   Barbiturates, Ur Screen 08/08/2023 NONE DETECTED  NONE DETECTED Final   Benzodiazepine, Ur Scrn 08/08/2023 NONE DETECTED  NONE DETECTED Final   Methadone Scn, Ur 08/08/2023 NONE DETECTED  NONE DETECTED Final   Comment: (NOTE) Tricyclics + metabolites, urine    Cutoff 1000 ng/mL Amphetamines + metabolites, urine  Cutoff 1000 ng/mL MDMA (Ecstasy), urine              Cutoff 500 ng/mL Cocaine Metabolite, urine          Cutoff 300 ng/mL Opiate + metabolites, urine        Cutoff 300 ng/mL Phencyclidine (PCP), urine          Cutoff 25 ng/mL Cannabinoid, urine                 Cutoff 50 ng/mL Barbiturates + metabolites, urine  Cutoff 200 ng/mL Benzodiazepine, urine              Cutoff 200 ng/mL Methadone, urine                   Cutoff 300 ng/mL  The urine drug screen provides only a preliminary, unconfirmed analytical test result and should not be used for non-medical purposes. Clinical consideration and professional judgment should be applied to any positive drug screen result due to possible interfering substances. A more specific alternate chemical method must be used in order to obtain a confirmed analytical result. Gas chromatography / mass spectrometry (GC/MS) is the preferred confirm                          atory method. Performed at [facility], [address], [place], [place] [address]   Admission on 08/07/2023, Discharged on 08/07/2023  Component Date Value Ref Range Status   Lithium  Lvl 08/07/2023 <0.06 (L)  0.60 - 1.20 mmol/L Final   Performed at [facility], [address] [place], [place] [address]  Admission on 08/05/2023, Discharged on 08/05/2023  Component Date Value Ref Range Status   Sodium 08/05/2023 141  135 - 145 mmol/L Final   Potassium 08/05/2023 3.7  3.5 - 5.1 mmol/L Final   Chloride 08/05/2023 105  98 - 111 mmol/L Final   CO2 08/05/2023 25  22 - 32 mmol/L Final   Glucose, Bld 08/05/2023 96  70 - 99 mg/dL Final   Glucose reference range applies only to samples taken after fasting for at least 8 hours.   BUN 08/05/2023 23 (H)  6 - 20 mg/dL Final   Creatinine, Ser 08/05/2023 0.73  0.61 - 1.24 mg/dL Final   Calcium 92/75/7974 9.1  8.9 - 10.3 mg/dL Final   Total Protein 92/75/7974 8.4 (H)  6.5 - 8.1 g/dL Final   Albumin 92/75/7974 4.2  3.5 - 5.0  g/dL Final   AST 92/75/7974 48 (H)  15 - 41 U/L Final   ALT 08/05/2023 130 (H)  0 - 44 U/L Final   Alkaline Phosphatase 08/05/2023 89  38 - 126 U/L Final   Total Bilirubin 08/05/2023 1.2  0.0 - 1.2 mg/dL Final    GFR, Estimated 08/05/2023 >60  >60 mL/min Final   Comment: (NOTE) Calculated using the CKD-EPI Creatinine Equation (2021)    Anion gap 08/05/2023 11  5 - 15 Final   Performed at [facility], [address] [place], [place] [address]   Alcohol, Ethyl (B) 08/05/2023 <15  <15 mg/dL Final   Comment: (NOTE) For medical purposes only. Performed at [facility], [address] [place], [place] [address]    WBC 08/05/2023 7.6  4.0 - 10.5 K/uL Final   RBC 08/05/2023 4.39  4.22 - 5.81 MIL/uL Final   Hemoglobin 08/05/2023 12.8 (L)  13.0 - 17.0 g/dL Final   HCT 92/75/7974 38.8 (L)  39.0 - 52.0 % Final   MCV 08/05/2023 88.4  80.0 - 100.0 fL Final   MCH 08/05/2023 29.2  26.0 - 34.0 pg Final   MCHC 08/05/2023 33.0  30.0 - 36.0 g/dL Final   RDW 92/75/7974 13.5  11.5 - 15.5 % Final   Platelets 08/05/2023 256  150 - 400 K/uL Final   nRBC 08/05/2023 0.0  0.0 - 0.2 % Final   Performed at [facility], [address] [place], [place] [address]   Lithium  Lvl 08/05/2023 <0.06 (L)  0.60 - 1.20 mmol/L Final   Performed at [facility], [address] [place], [place] [address]   WBC 08/05/2023 7.5  4.0 - 10.5 K/uL Final   RBC 08/05/2023 4.36  4.22 - 5.81 MIL/uL Final   Hemoglobin 08/05/2023 12.9 (L)  13.0 - 17.0 g/dL Final   HCT 92/75/7974 38.4 (L)  39.0 - 52.0 % Final   MCV 08/05/2023 88.1  80.0 - 100.0 fL Final   MCH 08/05/2023 29.6  26.0 - 34.0 pg Final   MCHC 08/05/2023 33.6  30.0 - 36.0 g/dL Final   RDW 92/75/7974 13.5  11.5 - 15.5 % Final   Platelets 08/05/2023 256  150 - 400 K/uL Final   nRBC 08/05/2023 0.0  0.0 - 0.2 % Final   Neutrophils Relative % 08/05/2023 48  % Final   Neutro Abs 08/05/2023 3.6  1.7 - 7.7 K/uL Final   Lymphocytes Relative 08/05/2023 36  % Final   Lymphs Abs 08/05/2023 2.7  0.7 - 4.0 K/uL Final   Monocytes Relative 08/05/2023 15  % Final   Monocytes Absolute 08/05/2023 1.1 (H)  0.1 - 1.0 K/uL Final   Eosinophils Relative  08/05/2023 1  % Final   Eosinophils Absolute 08/05/2023 0.1  0.0 - 0.5 K/uL Final   Basophils Relative 08/05/2023 0  % Final   Basophils Absolute 08/05/2023 0.0  0.0 - 0.1 K/uL Final   Immature Granulocytes 08/05/2023 0  % Final   Abs Immature Granulocytes 08/05/2023 0.03  0.00 - 0.07 K/uL Final   Performed at [facility], [address] [place], [place] [address]   Acetaminophen  (Tylenol ), Serum 08/05/2023 <10 (L)  10 - 30 ug/mL Final   Comment: (NOTE) Therapeutic concentrations vary significantly. A range of 10-30 ug/mL  may be an effective concentration for many patients. However, some  are best treated at concentrations outside of this range. Acetaminophen  concentrations >150 ug/mL at 4 hours after ingestion  and >50 ug/mL  at 12 hours after ingestion are often associated with  toxic reactions.  Performed at [facility], [address] [place], [place] [address]    Salicylate Lvl 08/05/2023 <7.0 (L)  7.0 - 30.0 mg/dL Final   Performed at [facility], [address] [place], [place] [address]  Admission on 07/27/2023, Discharged on 08/01/2023  Component Date Value Ref Range Status   Hgb A1c MFr Bld 07/29/2023 4.9  4.8 - 5.6 % Final   Comment: (NOTE) Diagnosis of Diabetes The following HbA1c ranges recommended by the American Diabetes Association ([name]) may be used as an aid in the diagnosis of diabetes mellitus.  Hemoglobin             Suggested A1C NGSP%              Diagnosis  <5.7                   Non Diabetic  5.7-6.4                Pre-Diabetic  >6.4                   Diabetic  <7.0                   Glycemic control for                       adults with diabetes.     Mean Plasma Glucose 07/29/2023 93.93  mg/dL Final   Performed at [facility], [address] [place], [place] [address]   Cholesterol 07/29/2023 139  0 - 200 mg/dL Final   Triglycerides 92/82/7974 106  <150 mg/dL Final   HDL 92/82/7974 56  >40 mg/dL Final    Total CHOL/HDL Ratio 07/29/2023 2.5  RATIO Final   VLDL 07/29/2023 21  0 - 40 mg/dL Final   LDL Cholesterol 07/29/2023 62  0 - 99 mg/dL Final   Comment:        Total Cholesterol/HDL:CHD Risk Coronary Heart Disease Risk Table                     Men   Women  1/2 Average Risk   3.4   3.3  Average Risk       5.0   4.4  2 X Average Risk   9.6   7.1  3 X Average Risk  23.4   11.0        Use the calculated Patient Ratio above and the CHD Risk Table to determine the patient's CHD Risk.        ATP III CLASSIFICATION (LDL):  <100     mg/dL   Optimal  899-870  mg/dL   Near or Above                    Optimal  130-159  mg/dL   Borderline  839-810  mg/dL   High  >809     mg/dL   Very High Performed at [facility], [address], [place], [place] [address]    Glucose-Capillary 07/29/2023 91  70 - 99 mg/dL Final   Glucose reference range applies only to samples taken after fasting for at least 8 hours.   Lithium  Lvl 08/01/2023 0.49 (L)  0.60 - 1.20 mmol/L Final   Performed at [facility], [address] [place], [place] [address]  Admission on 07/26/2023,  Discharged on 07/27/2023  Component Date Value Ref Range Status   Sodium 07/26/2023 142  135 - 145 mmol/L Final   Potassium 07/26/2023 3.3 (L)  3.5 - 5.1 mmol/L Final   Chloride 07/26/2023 109  98 - 111 mmol/L Final   CO2 07/26/2023 25  22 - 32 mmol/L Final   Glucose, Bld 07/26/2023 119 (H)  70 - 99 mg/dL Final   Glucose reference range applies only to samples taken after fasting for at least 8 hours.   BUN 07/26/2023 19  6 - 20 mg/dL Final   Creatinine, Ser 07/26/2023 0.84  0.61 - 1.24 mg/dL Final   Calcium 92/85/7974 8.9  8.9 - 10.3 mg/dL Final   Total Protein 92/85/7974 6.9  6.5 - 8.1 g/dL Final   Albumin 92/85/7974 3.9  3.5 - 5.0 g/dL Final   AST 92/85/7974 69 (H)  15 - 41 U/L Final   ALT 07/26/2023 26  0 - 44 U/L Final   Alkaline Phosphatase 07/26/2023 67  38 - 126 U/L Final   Total  Bilirubin 07/26/2023 1.3 (H)  0.0 - 1.2 mg/dL Final   GFR, Estimated 07/26/2023 >60  >60 mL/min Final   Comment: (NOTE) Calculated using the CKD-EPI Creatinine Equation (2021)    Anion gap 07/26/2023 8  5 - 15 Final   Performed at [facility], [address] [place], [place] [address]   Alcohol, Ethyl (B) 07/26/2023 <15  <15 mg/dL Final   Comment: (NOTE) For medical purposes only. Performed at [facility], [address] [place], [place] [address]    WBC 07/26/2023 6.5  4.0 - 10.5 K/uL Final   RBC 07/26/2023 4.43  4.22 - 5.81 MIL/uL Final   Hemoglobin 07/26/2023 13.1  13.0 - 17.0 g/dL Final   HCT 92/85/7974 37.9 (L)  39.0 - 52.0 % Final   MCV 07/26/2023 85.6  80.0 - 100.0 fL Final   MCH 07/26/2023 29.6  26.0 - 34.0 pg Final   MCHC 07/26/2023 34.6  30.0 - 36.0 g/dL Final   RDW 92/85/7974 13.2  11.5 - 15.5 % Final   Platelets 07/26/2023 221  150 - 400 K/uL Final   nRBC 07/26/2023 0.0  0.0 - 0.2 % Final   Performed at [facility], [address] [place] [address] [place], [place] [address]   Tricyclic, Ur Screen 07/26/2023 NONE DETECTED  NONE DETECTED Final   Amphetamines, Ur Screen 07/26/2023 NONE DETECTED  NONE DETECTED Final   MDMA (Ecstasy)Ur Screen 07/26/2023 NONE DETECTED  NONE DETECTED Final   Cocaine Metabolite,Ur [place] 07/26/2023 POSITIVE (A)  NONE DETECTED Final   Opiate, Ur Screen 07/26/2023 NONE DETECTED  NONE DETECTED Final   Phencyclidine (PCP) Ur S 07/26/2023 NONE DETECTED  NONE DETECTED Final   Cannabinoid 50 Ng, Ur [place] 07/26/2023 NONE DETECTED  NONE DETECTED Final   Barbiturates, Ur Screen 07/26/2023 NONE DETECTED  NONE DETECTED Final   Benzodiazepine, Ur Scrn 07/26/2023 NONE DETECTED  NONE DETECTED Final   Methadone Scn, Ur 07/26/2023 NONE DETECTED  NONE DETECTED Final   Comment: (NOTE) Tricyclics + metabolites, urine    Cutoff 1000 ng/mL Amphetamines + metabolites, urine  Cutoff 1000 ng/mL MDMA (Ecstasy), urine              Cutoff 500 ng/mL Cocaine  Metabolite, urine          Cutoff 300 ng/mL Opiate + metabolites, urine        Cutoff 300 ng/mL Phencyclidine (PCP), urine  Cutoff 25 ng/mL Cannabinoid, urine                 Cutoff 50 ng/mL Barbiturates + metabolites, urine  Cutoff 200 ng/mL Benzodiazepine, urine              Cutoff 200 ng/mL Methadone, urine                   Cutoff 300 ng/mL  The urine drug screen provides only a preliminary, unconfirmed analytical test result and should not be used for non-medical purposes. Clinical consideration and professional judgment should be applied to any positive drug screen result due to possible interfering substances. A more specific alternate chemical method must be used in order to obtain a confirmed analytical result. Gas chromatography / mass spectrometry (GC/MS) is the preferred confirm                          atory method. Performed at [facility], [address] [place], [place] [address]    WBC 07/27/2023 5.6  4.0 - 10.5 K/uL Final   RBC 07/27/2023 4.66  4.22 - 5.81 MIL/uL Final   Hemoglobin 07/27/2023 13.7  13.0 - 17.0 g/dL Final   HCT 92/84/7974 41.1  39.0 - 52.0 % Final   MCV 07/27/2023 88.2  80.0 - 100.0 fL Final   MCH 07/27/2023 29.4  26.0 - 34.0 pg Final   MCHC 07/27/2023 33.3  30.0 - 36.0 g/dL Final   RDW 92/84/7974 13.6  11.5 - 15.5 % Final   Platelets 07/27/2023 195  150 - 400 K/uL Final   nRBC 07/27/2023 0.0  0.0 - 0.2 % Final   Neutrophils Relative % 07/27/2023 49  % Final   Neutro Abs 07/27/2023 2.7  1.7 - 7.7 K/uL Final   Lymphocytes Relative 07/27/2023 40  % Final   Lymphs Abs 07/27/2023 2.2  0.7 - 4.0 K/uL Final   Monocytes Relative 07/27/2023 9  % Final   Monocytes Absolute 07/27/2023 0.5  0.1 - 1.0 K/uL Final   Eosinophils Relative 07/27/2023 2  % Final   Eosinophils Absolute 07/27/2023 0.1  0.0 - 0.5 K/uL Final   Basophils Relative 07/27/2023 0  % Final   Basophils Absolute 07/27/2023 0.0  0.0 - 0.1 K/uL Final   Immature  Granulocytes 07/27/2023 0  % Final   Abs Immature Granulocytes 07/27/2023 0.01  0.00 - 0.07 K/uL Final   Performed at [facility], [address] [place], [place] [address]  Admission on 05/25/2023, Discharged on 06/05/2023  Component Date Value Ref Range Status   Folate 05/26/2023 5.7 (L)  >5.9 ng/mL Final   Performed at [facility], [address] [place], [place] [address]   RPR Ser Ql 05/26/2023 NON REACTIVE  NON REACTIVE Final   Performed at [facility], [address], [place], [place] [address]   TSH 05/26/2023 1.178  0.350 - 4.500 uIU/mL Final   Comment: Performed by a 3rd Generation assay with a functional sensitivity of <=0.01 uIU/mL. Performed at [facility], [address], [place], [place] [address]    Vitamin B-12 05/26/2023 177 (L)  180 - 914 pg/mL Final   Comment: (NOTE) This assay is not validated for testing neonatal or myeloproliferative syndrome specimens for Vitamin B12 levels. Performed at [facility], [address] [place], [place] [address]    Vit D, 25-Hydroxy 05/26/2023 39.67  30 - 100 ng/mL Final  Comment: (NOTE) Vitamin D  deficiency has been defined by the Institute of Medicine  and an Endocrine Society practice guideline as a level of serum 25-OH  vitamin D  less than 20 ng/mL (1,2). The Endocrine Society went on to  further define vitamin D  insufficiency as a level between 21 and 29  ng/mL (2).  1. IOM (Institute of Medicine). 2010. Dietary reference intakes for  calcium and D. [place]: [employer]. 2. Holick MF, Binkley Brielle, Bischoff-Ferrari HA, et al. Evaluation,  treatment, and prevention of vitamin D  deficiency: an Endocrine  Society clinical practice guideline, JCEM. 2011 Jul; 96(7): 1911-30.  Performed at [facility], [address] [place], [place] [address]    HIV Screen 4th Generation wRfx 05/26/2023 Non Reactive  Non Reactive  Final   Performed at [facility], [address], [place], [place] [address]   WBC 05/28/2023 7.3  4.0 - 10.5 K/uL Final   RBC 05/28/2023 4.61  4.22 - 5.81 MIL/uL Final   Hemoglobin 05/28/2023 13.4  13.0 - 17.0 g/dL Final   HCT 94/83/7974 42.3  39.0 - 52.0 % Final   MCV 05/28/2023 91.8  80.0 - 100.0 fL Final   MCH 05/28/2023 29.1  26.0 - 34.0 pg Final   MCHC 05/28/2023 31.7  30.0 - 36.0 g/dL Final   RDW 94/83/7974 13.2  11.5 - 15.5 % Final   Platelets 05/28/2023 216  150 - 400 K/uL Final   nRBC 05/28/2023 0.0  0.0 - 0.2 % Final   Performed at [facility], [address], [place], [place] [address]   CRP, High Sensitivity 05/28/2023 8.77 (H)  0.00 - 3.00 mg/L Final   Comment: (NOTE)         Relative Risk for Future Cardiovascular Event                             Low                 <1.00                             Average       1.00 - 3.00                             High                >3.00 Performed At: [facility] [address] [place], [place] [id number] Jennette Shorter MD Ey:[id number]    Neutrophils Relative % 05/28/2023 63  % Final   Neutro Abs 05/28/2023 4.6  1.7 - 7.7 K/uL Final   Lymphocytes Relative 05/28/2023 29  % Final   Lymphs Abs 05/28/2023 2.2  0.7 - 4.0 K/uL Final   Monocytes Relative 05/28/2023 7  % Final   Monocytes Absolute 05/28/2023 0.5  0.1 - 1.0 K/uL Final   Eosinophils Relative 05/28/2023 1  % Final   Eosinophils Absolute 05/28/2023 0.0  0.0 - 0.5 K/uL Final   Basophils Relative 05/28/2023 0  % Final   Basophils Absolute 05/28/2023 0.0  0.0 - 0.1 K/uL Final   Immature Granulocytes 05/28/2023 0  % Final   Abs Immature Granulocytes 05/28/2023 0.02  0.00 - 0.07 K/uL Final   Performed at [facility], [address], [place], [place] [address]   Troponin  I (High Sensitivity) 05/28/2023 <2  <18 ng/L Final   Comment: (NOTE) Elevated high sensitivity troponin I (hsTnI) values and significant  changes  across serial measurements may suggest ACS but many other  chronic and acute conditions are known to elevate hsTnI results.  Refer to the Links section for chest pain algorithms and additional  guidance. Performed at [facility], [address] [place], [place] [address]    WBC 06/01/2023 7.0  4.0 - 10.5 K/uL Final   RBC 06/01/2023 4.66  4.22 - 5.81 MIL/uL Final   Hemoglobin 06/01/2023 13.7  13.0 - 17.0 g/dL Final   HCT 94/79/7974 41.9  39.0 - 52.0 % Final   MCV 06/01/2023 89.9  80.0 - 100.0 fL Final   MCH 06/01/2023 29.4  26.0 - 34.0 pg Final   MCHC 06/01/2023 32.7  30.0 - 36.0 g/dL Final   RDW 94/79/7974 13.5  11.5 - 15.5 % Final   Platelets 06/01/2023 218  150 - 400 K/uL Final   nRBC 06/01/2023 0.0  0.0 - 0.2 % Final   Neutrophils Relative % 06/01/2023 79  % Final   Neutro Abs 06/01/2023 5.5  1.7 - 7.7 K/uL Final   Lymphocytes Relative 06/01/2023 13  % Final   Lymphs Abs 06/01/2023 0.9  0.7 - 4.0 K/uL Final   Monocytes Relative 06/01/2023 7  % Final   Monocytes Absolute 06/01/2023 0.5  0.1 - 1.0 K/uL Final   Eosinophils Relative 06/01/2023 1  % Final   Eosinophils Absolute 06/01/2023 0.0  0.0 - 0.5 K/uL Final   Basophils Relative 06/01/2023 0  % Final   Basophils Absolute 06/01/2023 0.0  0.0 - 0.1 K/uL Final   Immature Granulocytes 06/01/2023 0  % Final   Abs Immature Granulocytes 06/01/2023 0.02  0.00 - 0.07 K/uL Final   Performed at [facility], [address] [place], [place] [address]   Clozapine  Lvl 06/01/2023 660 (H)  350 - 600 ng/mL Final   Comment: (NOTE) This test was developed and its performance characteristics determined by [facility]. It has not been cleared or approved by the Food and Drug Administration.    NorClozapine 06/01/2023 121  Not Estab. ng/mL Final   Comment: (NOTE) This test was developed and its performance characteristics determined by [facility]. It has not been cleared or approved by the Food and Drug  Administration.    Total(Cloz+Norcloz) 06/01/2023 781  ng/mL Final   Comment: (NOTE) Plasma concentrations of clozapine  plus norclozapine (combined total) greater than 450 ng/mL have been associated with therapeutic effect.                                Detection Limit = 20 Performed At: [facility] [address] [place], [place] [id number] Jennette Shorter MD Ey:[id number]    CRP 06/01/2023 1.7 (H)  <1.0 mg/dL Final   Performed at [facility], [address], [place], [place] [address]   Troponin I (High Sensitivity) 06/01/2023 7  <18 ng/L Final   Comment: (NOTE) Elevated high sensitivity troponin I (hsTnI) values and significant  changes across serial measurements may suggest ACS but many other  chronic and acute conditions are known to elevate hsTnI results.  Refer to the Links section for chest pain algorithms and additional  guidance. Performed at [facility], [address], [place], [place] [address]    Lithium  Lvl 06/05/2023 0.81  0.60 - 1.20 mmol/L  Final   Performed at [facility], [address], [place], [place] [address]  There may be more visits with results that are not included.    Allergies: Ibuprofen, Risperidone and paliperidone, Ziprasidone , Benztropine , and Quetiapine   Medications:  PTA Medications  Medication Sig   ARIPiprazole  (ABILIFY ) 10 MG tablet Take 1 tablet (10 mg total) by mouth daily. (Patient not taking: Reported on 09/18/2023)      Medical Decision Making  Patient recommended for ontinuous observation for crisis stabilization     Recommendations  Based on my evaluation the patient does not appear to have an emergency medical condition.  Angela  McLauchlin, NP    Revised Disposition: Per, Suzen Lesches, NP , 10/19/23, 7:30 AM patient meets criteria for inpatient psychiatric treatment, per admission and current presentation. Patient also meets [facility] IVC criteria, will be IVC if  attempts to leave.  Addendum signed by, Suzen Lesches, NP  10/19/23  5:05 AM

## 2023-10-19 NOTE — ED Notes (Signed)
 Pt A&O x 4, very sleepy, arouseable to verbal stimuli.  Presents with complaint of SI, plan to shoot self.  Paranoia and auditory hallucinations noted.  Pt states people are after him.  Comfort measures given.  Monitoring for safety.

## 2023-10-20 DIAGNOSIS — R45851 Suicidal ideations: Secondary | ICD-10-CM | POA: Diagnosis not present

## 2023-10-20 MED ORDER — OLANZAPINE 5 MG PO TABS: 5.0000 mg | ORAL_TABLET | Freq: Two times a day (BID) | ORAL | Status: DC

## 2023-10-20 MED ORDER — OLANZAPINE 5 MG PO TABS: 5.0000 mg | ORAL_TABLET | Freq: Two times a day (BID) | ORAL | 0 refills | Status: AC

## 2023-10-20 NOTE — ED Notes (Signed)
 Pt asleep at this hour. No apparent distress. RR even and unlabored. Monitored for safety.
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
## 2023-10-20 NOTE — ED Notes (Signed)
 At the time of discharge, the patient was alert and oriented 3, with coherent thought process and appropriate mood and affect. No current suicidal or homicidal ideation, plan, or intent was reported.  Vital signs at discharge were stable and within normal limits. No signs of acute distress were noted.Discharge instructions were reviewed with patient Bus pass given to pt Patient verbalized understanding of discharge instructions and was provided a copy. All questions were addressed prior to discharge. The patient left the facility in stable condition.

## 2023-10-20 NOTE — ED Notes (Signed)
 Pt offered lunch

## 2023-10-20 NOTE — ED Notes (Signed)
 Pt sleep, no signs of acute distress, respirations unlabored. Monitored for safety

## 2023-10-20 NOTE — Discharge Instructions (Signed)
 Base on the information you have provided and the presenting issue, outpatient services and resources for have been recommended.  It is imperative that you follow through with treatment recommendations within 5-7 days from the of discharge to mitigate further risk to your safety and mental well-being. A list of referrals has been provided below to get you started.  You are not limited to the list provided.  In case of an urgent crisis, you may contact the Mobile Crisis Unit with [facility] at [phone].    [facility] [address], [place], [address] [phone] phone   New Patient Assessment/Therapy Walk-Ins:  Monday and Wednesday: 8 am until slots are full. Every 1st and 2nd Fridays of the month: 1 pm - 5 pm.  NO ASSESSMENT/THERAPY WALK-INS ON TUESDAYS OR THURSDAYS  New Patient Assessment/Medication Management Walk-Ins:  Monday - Friday:  8 am - 11 am.  For all walk-ins, we ask that you arrive by 7:00 am because patients will be seen in the order of arrival.  Availability is limited; therefore, you may not be seen on the same day that you walk-in.  Our goal is to serve and meet the needs of our community to the best of our ability.

## 2023-10-20 NOTE — ED Notes (Signed)
 Patient remains on unit with no acute changes noted at this time. Alert and oriented.  Interacting with peers/staff appropriately though minimally. No current suicidal or homicidal ideation reported. No hallucinations or delusions observed. Patient is tolerating the milieu.

## 2023-11-13 ENCOUNTER — Emergency Department (HOSPITAL_COMMUNITY)
Admission: EM | Admit: 2023-11-13 | Discharge: 2023-11-13 | Disposition: A | Discharge status: Home / Self Care | Attending: Emergency Medicine | Admitting: Emergency Medicine

## 2023-11-13 ENCOUNTER — Emergency Department (HOSPITAL_COMMUNITY)

## 2023-11-13 DIAGNOSIS — Z781 Physical restraint status: Secondary | ICD-10-CM | POA: Diagnosis present

## 2023-11-13 DIAGNOSIS — F602 Antisocial personality disorder: Secondary | ICD-10-CM | POA: Diagnosis not present

## 2023-11-13 DIAGNOSIS — S40212A Abrasion of left shoulder, initial encounter: Secondary | ICD-10-CM | POA: Diagnosis not present

## 2023-11-13 DIAGNOSIS — Z79899 Other long term (current) drug therapy: Secondary | ICD-10-CM | POA: Diagnosis not present

## 2023-11-13 DIAGNOSIS — S41001A Unspecified open wound of right shoulder, initial encounter: Secondary | ICD-10-CM | POA: Diagnosis not present

## 2023-11-13 DIAGNOSIS — F603 Borderline personality disorder: Secondary | ICD-10-CM | POA: Insufficient documentation

## 2023-11-13 DIAGNOSIS — F259 Schizoaffective disorder, unspecified: Secondary | ICD-10-CM | POA: Diagnosis not present

## 2023-11-13 DIAGNOSIS — S4991XA Unspecified injury of right shoulder and upper arm, initial encounter: Secondary | ICD-10-CM | POA: Diagnosis present

## 2023-11-13 DIAGNOSIS — Z0289 Encounter for other administrative examinations: Secondary | ICD-10-CM | POA: Insufficient documentation

## 2023-11-13 DIAGNOSIS — H5703 Miosis: Secondary | ICD-10-CM | POA: Insufficient documentation

## 2023-11-13 DIAGNOSIS — R4689 Other symptoms and signs involving appearance and behavior: Secondary | ICD-10-CM

## 2023-11-13 DIAGNOSIS — S0990XA Unspecified injury of head, initial encounter: Secondary | ICD-10-CM | POA: Diagnosis present

## 2023-11-13 LAB — COMPREHENSIVE METABOLIC PANEL WITH GFR
ALT: 13 U/L (ref 0–44)
AST: 28 U/L (ref 15–41)
Albumin: 3.4 g/dL — ABNORMAL LOW (ref 3.5–5.0)
Alkaline Phosphatase: 51 U/L (ref 38–126)
Anion gap: 11 (ref 5–15)
BUN: 17 mg/dL (ref 6–20)
CO2: 19 mmol/L — ABNORMAL LOW (ref 22–32)
Calcium: 8.7 mg/dL — ABNORMAL LOW (ref 8.9–10.3)
Chloride: 110 mmol/L (ref 98–111)
Creatinine, Ser: 1.16 mg/dL (ref 0.61–1.24)
GFR, Estimated: >60 mL/min
Glucose, Bld: 78 mg/dL (ref 70–99)
Potassium: 4.9 mmol/L (ref 3.5–5.1)
Sodium: 140 mmol/L (ref 135–145)
Total Bilirubin: 0.3 mg/dL (ref 0.0–1.2)
Total Protein: 6.4 g/dL — ABNORMAL LOW (ref 6.5–8.1)

## 2023-11-13 LAB — CBC WITH DIFFERENTIAL/PLATELET
Abs Immature Granulocytes: 0.01 K/uL (ref 0.00–0.07)
Basophils Absolute: 0 K/uL (ref 0.0–0.1)
Basophils Relative: 0 %
Eosinophils Absolute: 0 K/uL (ref 0.0–0.5)
Eosinophils Relative: 0 %
HCT: 41.3 % (ref 39.0–52.0)
Hemoglobin: 13.5 g/dL (ref 13.0–17.0)
Immature Granulocytes: 0 %
Lymphocytes Relative: 31 %
Lymphs Abs: 1.5 K/uL (ref 0.7–4.0)
MCH: 29 pg (ref 26.0–34.0)
MCHC: 32.7 g/dL (ref 30.0–36.0)
MCV: 88.8 fL (ref 80.0–100.0)
Monocytes Absolute: 0.4 K/uL (ref 0.1–1.0)
Monocytes Relative: 9 %
Neutro Abs: 2.8 K/uL (ref 1.7–7.7)
Neutrophils Relative %: 60 %
Platelets: 242 K/uL (ref 150–400)
RBC: 4.65 MIL/uL (ref 4.22–5.81)
RDW: 13.2 % (ref 11.5–15.5)
WBC: 4.7 K/uL (ref 4.0–10.5)
nRBC: 0 % (ref 0.0–0.2)

## 2023-11-13 LAB — ETHANOL: Alcohol, Ethyl (B): <15 mg/dL (ref <15)

## 2023-11-13 MED ORDER — LACTATED RINGERS IV BOLUS
1000.0000 mL | Freq: Once | INTRAVENOUS | Status: AC
  Administered 2023-11-13: 1000 mL via INTRAVENOUS

## 2023-11-13 NOTE — ED Notes (Signed)
 PT able to walk to bathroom with minimal assistance.
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
## 2023-11-13 NOTE — ED Notes (Signed)
Walked patient to the bathroom patient did well patient is now back in bed with call bell in reach 

## 2023-11-13 NOTE — ED Notes (Signed)
 PT was transported to CT.
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
## 2023-11-13 NOTE — Discharge Instructions (Signed)
Please return if symptoms worsen.
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
## 2023-11-13 NOTE — ED Notes (Signed)
Got patient into a gown on the monitor did EKG shown to er provider
